# Patient Record
Sex: Female | Born: 1945
Health system: Southern US, Community
[De-identification: ages and names within clinical notes are randomized; demographics above are authoritative.]

## PROBLEM LIST (undated history)

## (undated) DIAGNOSIS — M8580 Other specified disorders of bone density and structure, unspecified site: Secondary | ICD-10-CM

## (undated) DIAGNOSIS — F329 Major depressive disorder, single episode, unspecified: Secondary | ICD-10-CM

## (undated) DIAGNOSIS — K2210 Ulcer of esophagus without bleeding: Secondary | ICD-10-CM

## (undated) DIAGNOSIS — F419 Anxiety disorder, unspecified: Secondary | ICD-10-CM

## (undated) DIAGNOSIS — H269 Unspecified cataract: Secondary | ICD-10-CM

## (undated) DIAGNOSIS — K219 Gastro-esophageal reflux disease without esophagitis: Secondary | ICD-10-CM

## (undated) DIAGNOSIS — G8929 Other chronic pain: Secondary | ICD-10-CM

## (undated) DIAGNOSIS — R112 Nausea with vomiting, unspecified: Secondary | ICD-10-CM

## (undated) DIAGNOSIS — M549 Dorsalgia, unspecified: Secondary | ICD-10-CM

## (undated) DIAGNOSIS — K635 Polyp of colon: Secondary | ICD-10-CM

## (undated) DIAGNOSIS — Z9889 Other specified postprocedural states: Secondary | ICD-10-CM

## (undated) DIAGNOSIS — K449 Diaphragmatic hernia without obstruction or gangrene: Secondary | ICD-10-CM

## (undated) DIAGNOSIS — G4700 Insomnia, unspecified: Secondary | ICD-10-CM

## (undated) DIAGNOSIS — I509 Heart failure, unspecified: Secondary | ICD-10-CM

## (undated) DIAGNOSIS — E785 Hyperlipidemia, unspecified: Secondary | ICD-10-CM

## (undated) DIAGNOSIS — L719 Rosacea, unspecified: Secondary | ICD-10-CM

## (undated) DIAGNOSIS — I10 Essential (primary) hypertension: Secondary | ICD-10-CM

## (undated) DIAGNOSIS — T8859XA Other complications of anesthesia, initial encounter: Secondary | ICD-10-CM

## (undated) DIAGNOSIS — IMO0002 Reserved for concepts with insufficient information to code with codable children: Secondary | ICD-10-CM

## (undated) DIAGNOSIS — C439 Malignant melanoma of skin, unspecified: Secondary | ICD-10-CM

## (undated) DIAGNOSIS — T4145XA Adverse effect of unspecified anesthetic, initial encounter: Secondary | ICD-10-CM

## (undated) DIAGNOSIS — M1990 Unspecified osteoarthritis, unspecified site: Secondary | ICD-10-CM

## (undated) HISTORY — DX: Essential (primary) hypertension: I10

## (undated) HISTORY — DX: Unspecified osteoarthritis, unspecified site: M19.90

## (undated) HISTORY — PX: VAGINAL HYSTERECTOMY: SUR661

## (undated) HISTORY — PX: WISDOM TOOTH EXTRACTION: SHX21

## (undated) HISTORY — DX: Anxiety disorder, unspecified: F41.9

## (undated) HISTORY — DX: Unspecified cataract: H26.9

## (undated) HISTORY — DX: Gastro-esophageal reflux disease without esophagitis: K21.9

## (undated) HISTORY — DX: Dorsalgia, unspecified: M54.9

## (undated) HISTORY — DX: Insomnia, unspecified: G47.00

## (undated) HISTORY — PX: APPENDECTOMY: SHX54

## (undated) HISTORY — PX: TUBAL LIGATION: SHX77

## (undated) HISTORY — DX: Rosacea, unspecified: L71.9

## (undated) HISTORY — PX: SKIN CANCER EXCISION: SHX779

## (undated) HISTORY — DX: Polyp of colon: K63.5

## (undated) HISTORY — DX: Major depressive disorder, single episode, unspecified: F32.9

## (undated) HISTORY — DX: Other specified disorders of bone density and structure, unspecified site: M85.80

## (undated) HISTORY — DX: Ulcer of esophagus without bleeding: K22.10

## (undated) HISTORY — PX: CARDIAC CATHETERIZATION: SHX172

## (undated) HISTORY — DX: Other chronic pain: G89.29

## (undated) HISTORY — DX: Malignant melanoma of skin, unspecified: C43.9

## (undated) HISTORY — DX: Diaphragmatic hernia without obstruction or gangrene: K44.9

## (undated) HISTORY — DX: Hyperlipidemia, unspecified: E78.5

## (undated) HISTORY — DX: Reserved for concepts with insufficient information to code with codable children: IMO0002

---

## 2000-12-23 ENCOUNTER — Other Ambulatory Visit: Admission: RE | Admit: 2000-12-23 | Discharge: 2000-12-23 | Payer: Self-pay | Admitting: Family Medicine

## 2001-06-15 ENCOUNTER — Encounter: Payer: Self-pay | Admitting: Cardiology

## 2001-06-15 ENCOUNTER — Ambulatory Visit (HOSPITAL_COMMUNITY): Admission: RE | Admit: 2001-06-15 | Discharge: 2001-06-16 | Payer: Self-pay | Admitting: Cardiology

## 2001-07-15 ENCOUNTER — Encounter: Payer: Self-pay | Admitting: Gastroenterology

## 2001-10-04 ENCOUNTER — Encounter: Payer: Self-pay | Admitting: Gastroenterology

## 2001-12-24 ENCOUNTER — Other Ambulatory Visit: Admission: RE | Admit: 2001-12-24 | Discharge: 2001-12-24 | Payer: Self-pay | Admitting: Family Medicine

## 2002-12-26 ENCOUNTER — Other Ambulatory Visit: Admission: RE | Admit: 2002-12-26 | Discharge: 2002-12-26 | Payer: Self-pay | Admitting: Family Medicine

## 2004-01-09 ENCOUNTER — Ambulatory Visit: Payer: Self-pay | Admitting: Family Medicine

## 2004-01-09 ENCOUNTER — Other Ambulatory Visit: Admission: RE | Admit: 2004-01-09 | Discharge: 2004-01-09 | Payer: Self-pay | Admitting: Family Medicine

## 2004-02-21 ENCOUNTER — Ambulatory Visit: Payer: Self-pay | Admitting: Family Medicine

## 2004-04-18 ENCOUNTER — Ambulatory Visit: Payer: Self-pay | Admitting: Gastroenterology

## 2004-07-09 ENCOUNTER — Ambulatory Visit: Payer: Self-pay | Admitting: Family Medicine

## 2004-09-09 ENCOUNTER — Ambulatory Visit: Payer: Self-pay | Admitting: Internal Medicine

## 2004-09-18 ENCOUNTER — Ambulatory Visit: Payer: Self-pay | Admitting: Internal Medicine

## 2004-09-18 ENCOUNTER — Ambulatory Visit: Payer: Self-pay

## 2005-01-22 ENCOUNTER — Ambulatory Visit: Payer: Self-pay | Admitting: Family Medicine

## 2005-01-24 ENCOUNTER — Ambulatory Visit: Payer: Self-pay | Admitting: Family Medicine

## 2005-02-18 ENCOUNTER — Ambulatory Visit: Payer: Self-pay | Admitting: Family Medicine

## 2005-05-26 ENCOUNTER — Ambulatory Visit: Payer: Self-pay | Admitting: Family Medicine

## 2005-06-25 ENCOUNTER — Other Ambulatory Visit: Admission: RE | Admit: 2005-06-25 | Discharge: 2005-06-25 | Payer: Self-pay | Admitting: Obstetrics and Gynecology

## 2005-08-06 ENCOUNTER — Ambulatory Visit: Payer: Self-pay | Admitting: Internal Medicine

## 2005-08-20 ENCOUNTER — Encounter (INDEPENDENT_AMBULATORY_CARE_PROVIDER_SITE_OTHER): Payer: Self-pay | Admitting: Specialist

## 2005-08-20 ENCOUNTER — Ambulatory Visit (HOSPITAL_COMMUNITY): Admission: RE | Admit: 2005-08-20 | Discharge: 2005-08-21 | Payer: Self-pay | Admitting: Obstetrics and Gynecology

## 2005-09-30 ENCOUNTER — Ambulatory Visit: Payer: Self-pay | Admitting: Gastroenterology

## 2006-07-08 ENCOUNTER — Ambulatory Visit: Payer: Self-pay | Admitting: Internal Medicine

## 2006-07-31 ENCOUNTER — Encounter: Payer: Self-pay | Admitting: Internal Medicine

## 2006-07-31 ENCOUNTER — Ambulatory Visit: Payer: Self-pay

## 2006-08-06 ENCOUNTER — Ambulatory Visit: Payer: Self-pay | Admitting: Internal Medicine

## 2006-08-11 ENCOUNTER — Ambulatory Visit: Payer: Self-pay | Admitting: Family Medicine

## 2006-08-11 DIAGNOSIS — E7800 Pure hypercholesterolemia, unspecified: Secondary | ICD-10-CM | POA: Insufficient documentation

## 2006-08-11 DIAGNOSIS — E119 Type 2 diabetes mellitus without complications: Secondary | ICD-10-CM | POA: Insufficient documentation

## 2006-08-13 LAB — CONVERTED CEMR LAB
ALT: 20 units/L (ref 0–40)
AST: 23 units/L (ref 0–37)
Cholesterol: 185 mg/dL (ref 0–200)
Direct LDL: 79.5 mg/dL
HDL: 48.2 mg/dL (ref >39.0)
Hgb A1c MFr Bld: 5.5 % (ref 4.6–6.0)
Total CHOL/HDL Ratio: 3.8
Triglycerides: 266 mg/dL (ref 0–149)
VLDL: 53 mg/dL — ABNORMAL HIGH (ref 0–40)

## 2006-10-05 ENCOUNTER — Ambulatory Visit: Payer: Self-pay | Admitting: Gastroenterology

## 2007-04-28 ENCOUNTER — Ambulatory Visit: Payer: Self-pay | Admitting: Family Medicine

## 2007-05-04 LAB — CONVERTED CEMR LAB
CRP, High Sensitivity: <1 — ABNORMAL LOW (ref 0.00–5.00)
Cholesterol: 143 mg/dL (ref 0–200)
Direct LDL: 57.5 mg/dL
HDL: 44.1 mg/dL (ref >39.0)
LDL Cholesterol: 55 mg/dL (ref 0–99)
Total CHOL/HDL Ratio: 3.2
Triglycerides: 221 mg/dL (ref 0–149)
VLDL: 44 mg/dL — ABNORMAL HIGH (ref 0–40)

## 2007-06-03 ENCOUNTER — Ambulatory Visit: Payer: Self-pay | Admitting: Internal Medicine

## 2007-06-22 DIAGNOSIS — K208 Other esophagitis without bleeding: Secondary | ICD-10-CM | POA: Insufficient documentation

## 2007-06-22 DIAGNOSIS — I429 Cardiomyopathy, unspecified: Secondary | ICD-10-CM | POA: Insufficient documentation

## 2007-06-22 DIAGNOSIS — K219 Gastro-esophageal reflux disease without esophagitis: Secondary | ICD-10-CM | POA: Insufficient documentation

## 2007-06-22 DIAGNOSIS — G43909 Migraine, unspecified, not intractable, without status migrainosus: Secondary | ICD-10-CM | POA: Insufficient documentation

## 2007-06-24 ENCOUNTER — Encounter: Payer: Self-pay | Admitting: Family Medicine

## 2007-06-29 ENCOUNTER — Encounter (INDEPENDENT_AMBULATORY_CARE_PROVIDER_SITE_OTHER): Payer: Self-pay | Admitting: *Deleted

## 2007-10-08 DIAGNOSIS — Z860100 Personal history of colon polyps, unspecified: Secondary | ICD-10-CM | POA: Insufficient documentation

## 2007-10-08 DIAGNOSIS — K449 Diaphragmatic hernia without obstruction or gangrene: Secondary | ICD-10-CM | POA: Insufficient documentation

## 2007-10-11 ENCOUNTER — Ambulatory Visit: Payer: Self-pay | Admitting: Gastroenterology

## 2007-11-22 ENCOUNTER — Ambulatory Visit: Payer: Self-pay | Admitting: Internal Medicine

## 2007-11-22 LAB — CONVERTED CEMR LAB
BUN: 15 mg/dL (ref 6–23)
CO2: 31 meq/L (ref 19–32)
Calcium: 9.7 mg/dL (ref 8.4–10.5)
Chloride: 107 meq/L (ref 96–112)
Creatinine, Ser: 0.6 mg/dL (ref 0.4–1.2)
GFR calc Af Amer: 130 mL/min
GFR calc non Af Amer: 107 mL/min
Glucose, Bld: 112 mg/dL — ABNORMAL HIGH (ref 70–99)
Potassium: 3.8 meq/L (ref 3.5–5.1)
Sodium: 142 meq/L (ref 135–145)
TSH: 1.78 microintl units/mL (ref 0.35–5.50)

## 2007-12-09 ENCOUNTER — Ambulatory Visit: Payer: Self-pay | Admitting: Internal Medicine

## 2008-01-20 ENCOUNTER — Ambulatory Visit: Payer: Self-pay | Admitting: Internal Medicine

## 2008-01-20 LAB — CONVERTED CEMR LAB
ALT: 26 units/L (ref 0–35)
AST: 28 units/L (ref 0–37)
Cholesterol: 192 mg/dL (ref 0–200)
Direct LDL: 87 mg/dL
HDL: 47 mg/dL (ref >39.0)
Total CHOL/HDL Ratio: 4.1
Triglycerides: 258 mg/dL (ref 0–149)
VLDL: 52 mg/dL — ABNORMAL HIGH (ref 0–40)

## 2008-06-28 ENCOUNTER — Encounter: Payer: Self-pay | Admitting: Family Medicine

## 2008-06-29 ENCOUNTER — Encounter: Payer: Self-pay | Admitting: Internal Medicine

## 2008-06-29 ENCOUNTER — Ambulatory Visit: Payer: Self-pay | Admitting: Internal Medicine

## 2008-07-05 ENCOUNTER — Encounter (INDEPENDENT_AMBULATORY_CARE_PROVIDER_SITE_OTHER): Payer: Self-pay | Admitting: *Deleted

## 2008-07-24 ENCOUNTER — Ambulatory Visit: Payer: Self-pay | Admitting: Internal Medicine

## 2008-07-24 ENCOUNTER — Encounter: Payer: Self-pay | Admitting: Family Medicine

## 2008-08-01 ENCOUNTER — Encounter (INDEPENDENT_AMBULATORY_CARE_PROVIDER_SITE_OTHER): Payer: Self-pay | Admitting: *Deleted

## 2008-08-08 ENCOUNTER — Encounter (INDEPENDENT_AMBULATORY_CARE_PROVIDER_SITE_OTHER): Payer: Self-pay | Admitting: *Deleted

## 2008-09-08 ENCOUNTER — Telehealth: Payer: Self-pay | Admitting: Internal Medicine

## 2008-09-26 ENCOUNTER — Telehealth: Payer: Self-pay | Admitting: Gastroenterology

## 2008-11-22 ENCOUNTER — Encounter (INDEPENDENT_AMBULATORY_CARE_PROVIDER_SITE_OTHER): Payer: Self-pay | Admitting: *Deleted

## 2009-03-23 ENCOUNTER — Telehealth: Payer: Self-pay | Admitting: Gastroenterology

## 2009-06-27 ENCOUNTER — Telehealth: Payer: Self-pay | Admitting: Internal Medicine

## 2009-06-28 ENCOUNTER — Ambulatory Visit: Payer: Self-pay | Admitting: Cardiology

## 2009-06-28 DIAGNOSIS — R079 Chest pain, unspecified: Secondary | ICD-10-CM | POA: Insufficient documentation

## 2009-07-02 ENCOUNTER — Encounter: Payer: Self-pay | Admitting: Cardiology

## 2009-07-09 ENCOUNTER — Ambulatory Visit: Payer: Self-pay | Admitting: Internal Medicine

## 2009-07-09 ENCOUNTER — Telehealth: Payer: Self-pay | Admitting: Gastroenterology

## 2009-07-12 ENCOUNTER — Encounter: Payer: Self-pay | Admitting: Family Medicine

## 2009-07-16 ENCOUNTER — Telehealth: Payer: Self-pay | Admitting: Internal Medicine

## 2009-07-16 ENCOUNTER — Encounter (INDEPENDENT_AMBULATORY_CARE_PROVIDER_SITE_OTHER): Payer: Self-pay | Admitting: *Deleted

## 2009-07-16 LAB — CONVERTED CEMR LAB
Cholesterol: 175 mg/dL (ref 0–200)
HDL: 54 mg/dL (ref >39.00)
Total CHOL/HDL Ratio: 3
VLDL: 53 mg/dL — ABNORMAL HIGH (ref 0.0–40.0)

## 2009-07-18 ENCOUNTER — Ambulatory Visit: Payer: Self-pay | Admitting: Gastroenterology

## 2009-07-20 ENCOUNTER — Ambulatory Visit: Payer: Self-pay | Admitting: Gastroenterology

## 2009-07-23 ENCOUNTER — Encounter: Payer: Self-pay | Admitting: Gastroenterology

## 2009-07-23 ENCOUNTER — Telehealth: Payer: Self-pay | Admitting: Gastroenterology

## 2009-08-08 ENCOUNTER — Telehealth: Payer: Self-pay | Admitting: Gastroenterology

## 2009-08-14 ENCOUNTER — Telehealth: Payer: Self-pay | Admitting: Gastroenterology

## 2009-08-14 ENCOUNTER — Telehealth: Payer: Self-pay | Admitting: Family Medicine

## 2009-08-29 ENCOUNTER — Ambulatory Visit: Payer: Self-pay | Admitting: Gastroenterology

## 2009-08-29 DIAGNOSIS — R197 Diarrhea, unspecified: Secondary | ICD-10-CM | POA: Insufficient documentation

## 2009-10-02 ENCOUNTER — Encounter: Payer: Self-pay | Admitting: Gastroenterology

## 2009-10-02 ENCOUNTER — Telehealth: Payer: Self-pay | Admitting: Gastroenterology

## 2009-10-06 ENCOUNTER — Encounter: Payer: Self-pay | Admitting: Gastroenterology

## 2009-10-08 ENCOUNTER — Encounter: Payer: Self-pay | Admitting: Gastroenterology

## 2009-10-09 ENCOUNTER — Telehealth: Payer: Self-pay | Admitting: Gastroenterology

## 2009-10-09 DIAGNOSIS — K659 Peritonitis, unspecified: Secondary | ICD-10-CM | POA: Insufficient documentation

## 2009-10-10 ENCOUNTER — Ambulatory Visit: Payer: Self-pay | Admitting: Cardiology

## 2009-10-10 ENCOUNTER — Ambulatory Visit: Payer: Self-pay | Admitting: Cardiovascular Disease

## 2009-10-10 ENCOUNTER — Ambulatory Visit: Payer: Self-pay

## 2009-10-10 ENCOUNTER — Encounter: Payer: Self-pay | Admitting: Cardiology

## 2009-10-10 ENCOUNTER — Ambulatory Visit (HOSPITAL_COMMUNITY): Admission: RE | Admit: 2009-10-10 | Discharge: 2009-10-10 | Payer: Self-pay | Admitting: Cardiology

## 2009-10-10 ENCOUNTER — Encounter: Payer: Self-pay | Admitting: Gastroenterology

## 2009-10-10 DIAGNOSIS — R609 Edema, unspecified: Secondary | ICD-10-CM | POA: Insufficient documentation

## 2009-10-10 DIAGNOSIS — E876 Hypokalemia: Secondary | ICD-10-CM | POA: Insufficient documentation

## 2009-10-10 LAB — CONVERTED CEMR LAB
ALT: 58 units/L — ABNORMAL HIGH (ref 0–35)
AST: 96 units/L — ABNORMAL HIGH (ref 0–37)
Albumin: 2.7 g/dL — ABNORMAL LOW (ref 3.5–5.2)
Alkaline Phosphatase: 46 units/L (ref 39–117)
BUN: 5 mg/dL — ABNORMAL LOW (ref 6–23)
Basophils Absolute: 0 10*3/uL (ref 0.0–0.1)
Basophils Relative: 0.2 % (ref 0.0–3.0)
Bilirubin, Direct: 0.1 mg/dL (ref 0.0–0.3)
CO2: 31 meq/L (ref 19–32)
Calcium: 8.3 mg/dL — ABNORMAL LOW (ref 8.4–10.5)
Chloride: 100 meq/L (ref 96–112)
Creatinine, Ser: 0.5 mg/dL (ref 0.4–1.2)
Eosinophils Absolute: 0.2 10*3/uL (ref 0.0–0.7)
Eosinophils Relative: 2.5 % (ref 0.0–5.0)
GFR calc non Af Amer: 122.98 mL/min (ref >60)
Glucose, Bld: 106 mg/dL — ABNORMAL HIGH (ref 70–99)
HCT: 33 % — ABNORMAL LOW (ref 36.0–46.0)
Hemoglobin: 11.3 g/dL — ABNORMAL LOW (ref 12.0–15.0)
Lymphocytes Relative: 8.3 % — ABNORMAL LOW (ref 12.0–46.0)
Lymphs Abs: 0.8 10*3/uL (ref 0.7–4.0)
MCHC: 34.2 g/dL (ref 30.0–36.0)
MCV: 88.8 fL (ref 78.0–100.0)
Monocytes Absolute: 0.8 10*3/uL (ref 0.1–1.0)
Monocytes Relative: 7.7 % (ref 3.0–12.0)
Neutro Abs: 8.1 10*3/uL — ABNORMAL HIGH (ref 1.4–7.7)
Neutrophils Relative %: 81.3 % — ABNORMAL HIGH (ref 43.0–77.0)
Platelets: 415 10*3/uL — ABNORMAL HIGH (ref 150.0–400.0)
Potassium: 2.8 meq/L — CL (ref 3.5–5.1)
Pro B Natriuretic peptide (BNP): 150.1 pg/mL — ABNORMAL HIGH (ref 0.0–100.0)
RBC: 3.71 M/uL — ABNORMAL LOW (ref 3.87–5.11)
RDW: 12.8 % (ref 11.5–14.6)
Sodium: 143 meq/L (ref 135–145)
Total Bilirubin: 0.2 mg/dL — ABNORMAL LOW (ref 0.3–1.2)
Total Protein: 5.8 g/dL — ABNORMAL LOW (ref 6.0–8.3)
WBC: 9.9 10*3/uL (ref 4.5–10.5)

## 2009-10-11 ENCOUNTER — Ambulatory Visit: Payer: Self-pay | Admitting: Family Medicine

## 2009-10-12 LAB — CONVERTED CEMR LAB
ALT: 63 units/L — ABNORMAL HIGH (ref 0–35)
AST: 84 units/L — ABNORMAL HIGH (ref 0–37)
BUN: 5 mg/dL — ABNORMAL LOW (ref 6–23)
CO2: 29 meq/L (ref 19–32)
Chloride: 105 meq/L (ref 96–112)
Eosinophils Absolute: 0.4 10*3/uL (ref 0.0–0.7)
Eosinophils Relative: 4.3 % (ref 0.0–5.0)
MCHC: 33.8 g/dL (ref 30.0–36.0)
MCV: 88.5 fL (ref 78.0–100.0)
Monocytes Absolute: 0.6 10*3/uL (ref 0.1–1.0)
Neutrophils Relative %: 76.8 % (ref 43.0–77.0)
Platelets: 412 10*3/uL — ABNORMAL HIGH (ref 150.0–400.0)
Potassium: 3.7 meq/L (ref 3.5–5.1)
Total Bilirubin: 0.2 mg/dL — ABNORMAL LOW (ref 0.3–1.2)
Total Protein: 5.4 g/dL — ABNORMAL LOW (ref 6.0–8.3)
WBC: 8.4 10*3/uL (ref 4.5–10.5)

## 2009-10-15 ENCOUNTER — Encounter: Payer: Self-pay | Admitting: Family Medicine

## 2009-10-19 ENCOUNTER — Telehealth: Payer: Self-pay | Admitting: Gastroenterology

## 2009-10-23 ENCOUNTER — Encounter (INDEPENDENT_AMBULATORY_CARE_PROVIDER_SITE_OTHER): Payer: Self-pay | Admitting: *Deleted

## 2009-10-29 ENCOUNTER — Encounter: Payer: Self-pay | Admitting: Family Medicine

## 2009-10-30 ENCOUNTER — Encounter: Admission: RE | Admit: 2009-10-30 | Discharge: 2009-10-30 | Payer: Self-pay | Admitting: General Surgery

## 2010-01-29 ENCOUNTER — Encounter: Payer: Self-pay | Admitting: Family Medicine

## 2010-02-19 ENCOUNTER — Encounter: Payer: Self-pay | Admitting: Gastroenterology

## 2010-04-07 LAB — CONVERTED CEMR LAB
BUN: 13 mg/dL (ref 6–23)
CO2: 24 meq/L (ref 19–32)
Calcium: 10.1 mg/dL (ref 8.4–10.5)
Chloride: 102 meq/L (ref 96–112)
Cholesterol: 195 mg/dL (ref 0–200)
Creatinine, Ser: 0.77 mg/dL (ref 0.40–1.20)
Glucose, Bld: 106 mg/dL — ABNORMAL HIGH (ref 70–99)
HCT: 40.8 % (ref 36.0–46.0)
HDL: 50 mg/dL (ref >39)
Hemoglobin: 13.2 g/dL (ref 12.0–15.0)
MCHC: 32.4 g/dL (ref 30.0–36.0)
MCV: 91.3 fL (ref 78.0–100.0)
Platelets: 274 10*3/uL (ref 150–400)
Potassium: 4.2 meq/L (ref 3.5–5.3)
Pro B Natriuretic peptide (BNP): 10.6 pg/mL (ref 0.0–100.0)
RBC: 4.47 M/uL (ref 3.87–5.11)
RDW: 13.1 % (ref 11.5–15.5)
Sodium: 140 meq/L (ref 135–145)
Total CHOL/HDL Ratio: 3.9
Triglycerides: 524 mg/dL — ABNORMAL HIGH (ref <150)
WBC: 6.3 10*3/uL (ref 4.0–10.5)

## 2010-04-09 NOTE — Procedures (Signed)
Summary: Upper Endoscopy  Patient: [name] Note: All result statuses are Final unless otherwise noted.  Tests: (1) Upper Endoscopy (EGD)   EGD Upper Endoscopy       DONE     [facility]     [address]     [place], [place]  [address]           ENDOSCOPY PROCEDURE REPORT           PATIENT:  [name], Sarah  MR#:  [id number]     BIRTHDATE:  December 22, 1945, 63 yrs. old  GENDER:  female     ENDOSCOPIST:  Judie Petit T. Russella Dar, MD, [facility]           PROCEDURE DATE:  07/20/2009     PROCEDURE:  EGD with biopsy     ASA CLASS:  Class II     INDICATIONS:  GERD, chest pain     MEDICATIONS:  Fentanyl 50 mcg IV, Versed 5 mg IV     TOPICAL ANESTHETIC:  Exactacain Spray     DESCRIPTION OF PROCEDURE:   After the risks benefits and     alternatives of the procedure were thoroughly explained, informed     consent was obtained.  The LB GIF-H180 [id number] endoscope was     introduced through the mouth and advanced to the second portion of     the duodenum, without limitations.  The instrument was slowly     withdrawn as the mucosa was fully examined.     <<PROCEDUREIMAGES>>           Esophagitis was found in the distal esophagus. It was erosive. LA     Classification Grade B. Mild gastritis was found in the body and     the antrum of the stomach. It was erythematous and patchy.     Biopsies of the antrum and body of the stomach were obtained and     sent to pathology.  The duodenal bulb was normal in appearance, as     was the postbulbar duodenum.  Otherwise the examination was     normal.   Retroflexed views revealed a hiatal hernia, 4 cm.  The     scope was then withdrawn from the patient and the procedure     completed.           COMPLICATIONS:  None           ENDOSCOPIC IMPRESSION:     1) Erosive esophagitis     2) Mild gastritis     3) Hiatal hernia           RECOMMENDATIONS:     1) Anti-reflux regimen and 4" bedblocks long term     2) PPI qam: Dexilant 60 mg po qam #30, 11  refills     3) Follow-up: office visit 1 month     4) Await pathology report           Malcolm T. Russella Dar, MD, Clementeen Graham           CC:  Judy Pimple, MD, Pricilla Riffle, MD           n.     Sarah DoctorVenita [name]. Stark at 07/20/2009 08:52 AM           [name], [name], [id number]  Note: An exclamation mark (!) indicates a result that was not dispersed into the flowsheet. Document Creation Date: 07/20/2009 8:53 AM _______________________________________________________________________  (1) Order result status: Final Collection or observation  date-time: 07/20/2009 08:43 Requested date-time:  Receipt date-time:  Reported date-time:  Referring Physician:   Ordering Physician: Claudette Head [phone]) Specimen Source:  Source: [name] Order Number: [id number] Lab site:

## 2010-04-09 NOTE — Progress Notes (Signed)
Summary: Schedule Colonoscopy   Phone Note Outgoing Call Call back at [facility] Phone [phone]   Call placed by: Harlow Mares CMA Duncan Dull),  March 23, 2009 4:59 PM Call placed to: Patient Summary of Call: Left message on patients machine to call back. she needs a direct colonoscopy....this is part of the recall project. Initial call taken by: Harlow Mares CMA Duncan Dull),  March 23, 2009 5:00 PM  Follow-up for Phone Call        Patient is calling back asking why she needs her colonoscopy sooner than 10 years. Her polyps are hyperplastic. She would like Dr.Dane Bloch to to review once more before she schedules her colonoscopy. I advised her I would send this note to Dr. Russella Dar and call her back and call her back. Follow-up by: Harlow Mares CMA Duncan Dull),  March 26, 2009 10:03 AM  Additional Follow-up for Phone Call Additional follow up Details #1::        As she notes a 10 year colonoscopy is correct given the hyperplastic polyp. Please change the scheduled colonoscopy recall to 09/2011. Additional Follow-up by: Meryl Dare MD Clementeen Graham,  March 26, 2009 10:08 AM    Additional Follow-up for Phone Call Additional follow up Details #2::    noted and changed in IDX and EMR flowsheet. Follow-up by: Harlow Mares CMA Duncan Dull),  March 30, 2009 10:37 AM

## 2010-04-09 NOTE — Progress Notes (Signed)
Summary: update on condt.i   Phone Note Call from Patient Call back at [facility] Phone [phone]   Caller: Patient Call For: Dr. Russella Dar Reason for Call: Talk to Nurse Summary of Call: Patient wants to let Dr Russella Dar know that she's doing much better and that she appreciates you calling. Initial call taken by: Tawni Levy,  October 19, 2009 4:43 PM

## 2010-04-09 NOTE — Letter (Signed)
Summary: Appointment - Missed  [facility], Main Office  [address]   [place], [place] [address]   Phone: [phone]  Fax: [phone]     October 23, 2009 MRN: [id number]   [name] [address] [place], [place]  [address]   Dear Ms. Passmore,  Our records indicate you missed your appointment on  August 8,2011 with Dr.Ross It is very important that we reach you to reschedule this appointment. We look forward to participating in your health care needs. Please contact us at the number listed above at your earliest convenience to reschedule this appointment.     Sincerely, Artist

## 2010-04-09 NOTE — Progress Notes (Signed)
Summary: appt   Phone Note Call from Patient Call back at [phone]   Caller: Patient Reason for Call: Talk to Nurse Summary of Call: pt has appt 5/2, does she need to come in before then...Marland KitchenMarland Kitchen pt very tired and wants to make sure everything is ok, some chest discomfort Initial call taken by: Migdalia Dk,  June 27, 2009 3:01 PM  Follow-up for Phone Call        Called patient ...she reports symptoms of chest pain with some jaw pain on and off times one and one-half weeks accompanied with extreme fatique. I offered her an appointment with Dr.Katz in this office tomorrow(DOD) but she wanted to wait until next week to see Dr.Hiba Garry...  I advised her that I was not comfortable with her waiting until next week to be seen. She asked about being seen in the [place] office and  agreed to be seen in the [place] office (tomorrow at 1015am with Dr.McLean) because it is near her home. I provided her with the phone # so she can call for directions. Follow-up by: Suzan Garibaldi RN

## 2010-04-09 NOTE — Letter (Signed)
Summary: [facility]   Imported By: Sherian Rein 10/23/2009 15:00:21  _____________________________________________________________________  External Attachment:    Type:   Image     Comment:   External Document

## 2010-04-09 NOTE — Progress Notes (Signed)
   Phone Note Outgoing Call   Summary of Call: Call back to pt this am.  Pt. c/o "tenderness bottom of my breast bone in the center of my chest."  Pt. rated the pain 1/10.  The pain is this present today.  Also pt reported she did not sleep last pm.  Pt. also questioned to find out if she should continue taking ASA 81mg  daily?  Pt. asked we speak with you about these concerns. Initial call taken by: Eual Fines RN,  Jul 23, 2009 8:43 AM  Follow-up for Phone Call        OK to stay on ASA 81 mg once daily. The pain could be from her esophagitis and she should remain on Dexilant 60mg  po qam and strict antireflux measures. It could take 6-8 weeks for her esophagits to heal.  Follow-up by: Meryl Dare MD FACG,  Jul 23, 2009 10:13 AM  Additional Follow-up for Phone Call Additional follow up Details #1::        Dr. Russella Dar answered questions about [name].  Phone call returned to pt.  Pt adivsed to continue ASA 81 mg daily.  Continue Dexilant 60 mg qam and strict antireflux measures.  The pain could be cased by the esoghagitis and it could take 6-8 weeks to heal.  Pt understands and no further questions at this time. Additional Follow-up by: Eual Fines RN,  Jul 23, 2009 1:02 PM

## 2010-04-09 NOTE — Assessment & Plan Note (Signed)
Summary: GERD, chest pain/all   History of Present Illness Visit Type: Follow-up Visit Primary GI MD: Elie Goody MD [facility] Primary Provider: Roxy Manns, MD Requesting Provider: n/a Chief Complaint: GERD, and chest pain  History of Present Illness:   [name], returns today on referral from Dr. Dietrich Pates. She relates several episodes of grabbing chest tightness that lasts 20-40 minutes at a time. Her symptoms have occurred multiple times over the past several weeks. She recently increased pantoprazole to twice a day from once daily, but continues to have symptoms. She has not noted any significant response to the use of TUMS during episodes of pain. She has a history of erosive esophagitis, associated with chest pain, initially diagnosed in 2003.   GI Review of Systems    Reports acid reflux, chest pain, and  heartburn.      Denies abdominal pain, belching, bloating, dysphagia with liquids, dysphagia with solids, loss of appetite, nausea, vomiting, vomiting blood, weight loss, and  weight gain.        Denies anal fissure, black tarry stools, change in bowel habit, constipation, diarrhea, diverticulosis, fecal incontinence, heme positive stool, hemorrhoids, irritable bowel syndrome, jaundice, light color stool, liver problems, rectal bleeding, and  rectal pain.   Current Medications (verified): 1)  Protonix 40 Mg  Tbec (Pantoprazole Sodium) .... 2 Tablets By Mouth Two Times A Day 2)  Multivitamins   Tabs (Multiple Vitamin) .... One Tablet By Mouth Once Daily 3)  Coreg 12.5 Mg  Tabs (Carvedilol) .... One Tablet By Mouth Two Times A Day 4)  Os-Cal 500 + D 500-200 Mg-Unit  Tabs (Calcium Carbonate-Vitamin D) .... One Tablet By Mouth Two Times A Day 5)  Enalapril Maleate 10 Mg  Tabs (Enalapril Maleate) .... One Tablet By Mouth Once Daily 6)  Simvastatin 20 Mg Tabs (Simvastatin) .... Take One Tablet By Mouth Daily At Bedtime 7)  Aspirin 81 Mg Tbec (Aspirin) .... Take One Tablet By Mouth  Daily  Allergies (verified): 1)  ! Penicillin 2)  ! Ceftin 3)  ! Prevacid (Lansoprazole) 4)  ! * Latex  Past History:  Past Medical History: 1. COLONIC POLYPS, HYPERPLASTIC, HX OF (ICD-V12.72) 2. MIGRAINE HEADACHE (ICD-346.90) 3. CARDIOMYOPATHY (ICD-425.4): Nonischemic.  Left heart cath 8/03 with no significant CAD.  EF moderate to severely depressed at one point but last echo in 5/08 showed EF 45-50% with peri-apical hypokinesis.  4. GERD (ICD-530.81) with EROSIVE ESOPHAGITIS LA Classification Grade B this(ICD-530.19) 5.  Dyslipidemia 6. Osteopenia     Past Surgical History: Reviewed history from 08/08/2008 and no changes required. BTL Hysterectomy osteopenia - dexa 5/10  Family History: Family History of Heart Disease: Father Alzheimer's disease: Mother No FH of Colon Cancer:  Social History: Reviewed history from 10/11/2007 and no changes required. 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  Alcohol Use - yes-wine Illicit Drug Use - no Patient gets regular exercise.  Review of Systems       The pertinent positives and negatives are noted as above and in the HPI. All other ROS were reviewed and were negative.   Vital Signs:  Patient profile:   65 year old female Height:      62 inches Weight:      135 pounds BSA:     1.62 Pulse rate:   62 / minute Pulse rhythm:   regular BP sitting:   126 / 64  (left arm) Cuff size:   regular  Vitals Entered By: Ok Anis CMA (Jul 18, 2009  10:23 AM)  Physical Exam  General:  Well developed, well nourished, no acute distress. Head:  Normocephalic and atraumatic. Eyes:  PERRLA, no icterus. Ears:  Normal auditory acuity. Mouth:  No deformity or lesions, dentition normal. Chest Wall:  Symmetrical;  no deformities or tenderness. Lungs:  Clear throughout to auscultation. Heart:  Regular rate and rhythm; no murmurs, rubs,  or bruits. Abdomen:  Soft, nontender and nondistended. No masses, hepatosplenomegaly or  hernias noted. Normal bowel sounds. Neurologic:  Alert and  oriented x4;  grossly normal neurologically. Psych:  Alert and cooperative. Normal mood and affect. anxious.  anxious.    Impression & Recommendations:  Problem # 1:  CHEST PAIN (ICD-786.50) Atypical chest pain. Symptoms are somewhat similar to her prior reflux symptoms. If EGD does not reveal significant problems. She will need to return to Dr. Tenny Craw for further evaluation. See problem #2. Orders: EGD (EGD)  Problem # 2:  GERD (ICD-530.81) Rule out recurrent esophagitis. Discontinue pantoprazole and begin Dexilant 60 mg q.a.m. May use TUMS as needed. The risks, benefits and alternatives to endoscopy with possible biopsy and possible dilation were discussed with the patient and they consent to proceed. The procedure will be scheduled electively. Orders: EGD (EGD)  Problem # 3:  SCREENING COLORECTAL-CANCER (ICD-V76.51) Average risk for colorectal cancer. Prior history of hyperplastic colon polyps. Screening colonoscopy recommended July 2013.  Patient Instructions: 1)  Hold Protonix and start Dexilant samples one tablet by mouth once daily. 2)  Upper Endoscopy brochure given.  3)  Copy sent to : Judith Part, MD 4)                         Dietrich Pates, MD 5)  The medication list was reviewed and reconciled.  All changed / newly prescribed medications were explained.  A complete medication list was provided to the patient / caregiver.  Appended Document: GERD, chest pain/all    Clinical Lists Changes  Observations: Added new observation of COLONNXTDUE: 09/2011 (07/18/2009 11:12)

## 2010-04-09 NOTE — Letter (Signed)
Summary: EGD Instructions  [facility]  [address] [place], [place] [address]   Phone: [phone]  Fax: [phone]       [name]    [birth year]    MRN: [id number]       Procedure Day /Date: Friday May 13th, 2011     Arrival Time: 7:30am     Procedure Time: 8:30am     Location of Procedure:                    _ x _ [facility] (4th Floor)    PREPARATION FOR ENDOSCOPY   On 07/20/09 THE DAY OF THE PROCEDURE:  1.   No solid foods, milk or milk products are allowed after midnight the night before your procedure.  2.   Do not drink anything colored red or purple.  Avoid juices with pulp.  No orange juice.  3.  You may drink clear liquids until 6:30am, which is 2 hours before your procedure.                                                                                                CLEAR LIQUIDS INCLUDE: Water Jello Ice Popsicles Tea (sugar ok, no milk/cream) Powdered fruit flavored drinks Coffee (sugar ok, no milk/cream) Gatorade Juice: apple, white grape, white cranberry  Lemonade Clear bullion, consomm, broth Carbonated beverages (any kind) Strained chicken noodle soup Hard Candy   MEDICATION INSTRUCTIONS  Unless otherwise instructed, you should take regular prescription medications with a small sip of water as early as possible the morning of your procedure.         OTHER INSTRUCTIONS  You will need a responsible adult at least 66 years of age to accompany you and drive you home.   This person must remain in the waiting room during your procedure.  Wear loose fitting clothing that is easily removed.  Leave jewelry and other valuables at home.  However, you may wish to bring a book to read or an iPod/MP3 player to listen to music as you wait for your procedure to start.  Remove all body piercing jewelry and leave at home.  Total time from sign-in until discharge is approximately 2-3 hours.  You should go home  directly after your procedure and rest.  You can resume normal activities the day after your procedure.  The day of your procedure you should not:   Drive   Make legal decisions   Operate machinery   Drink alcohol   Return to work  You will receive specific instructions about eating, activities and medications before you leave.    The above instructions have been reviewed and explained to me by   Marchelle Folks.     I fully understand and can verbalize these instructions _____________________________ Date _________

## 2010-04-09 NOTE — Progress Notes (Signed)
Summary: Authorization   Phone Note From Other Clinic   Caller: [place] @ [facility]  [phone] Call For: Dr. Russella Dar Summary of Call: Kathrynn Humble. was approved on May 23 with no end date. Iit is being rejected because it is too soon.  Initial call taken by: Karna Christmas,  August 08, 2009 3:59 PM  Follow-up for Phone Call        i spoke to [facility] and they have already approved the Dexilant and they have called the Pharm and spoke to Zella Ball the pt is aware her rx is ready and she can pick them up.  Follow-up by: Harlow Mares CMA Duncan Dull),  August 08, 2009 4:27 PM

## 2010-04-09 NOTE — Progress Notes (Signed)
Summary: auth needed   Phone Note From Pharmacy Call back at [phone]   Caller: Zella Ball, pharmacist Call For: Dr. Russella Dar  Summary of Call: Dexilant... prior authorization was not completed the first time and a new auth needs to be initiated Initial call taken by: Vallarie Mare,  August 08, 2009 1:33 PM  Follow-up for Phone Call        spoke to [facility] and they state that the prior auth hass been approved for the patients Dexilant and i called robin at the pharm to advise him he can wait 15 min and rerun the rx. Follow-up by: Harlow Mares CMA Duncan Dull),  August 08, 2009 2:14 PM

## 2010-04-09 NOTE — Assessment & Plan Note (Signed)
Summary: add-on, chest tightness   Medications Added FLAGYL 500 MG TABS (METRONIDAZOLE) three times a day as directed CIPROFLOXACIN HCL 500 MG TABS (CIPROFLOXACIN HCL) two times a day as directed POTASSIUM CHLORIDE CRYS CR 20 MEQ CR-TABS (POTASSIUM CHLORIDE CRYS CR) take 4 tablets today and 2 tablets in the morning, recheck BMP 10/11/09 FUROSEMIDE 20 MG TABS (FUROSEMIDE) Take as directed--do not start until MD makes further recommendations      Allergies Added:   Referring Johanan Skorupski:  n/a Primary Ereka Brau:  Roxy Manns, MD   History of Present Illness: This nice woman has a nonischemic CM, with EF previously of 20% and normal coronaries, and has been followed by Dr. Tenny Craw.  EF improved after initiation of therapy.  Has been stable overall.  Recently had erosive esophagitis.  She then was at [facility], had a ruptured appendix, and underwent emergency surgery.  Has done well, but now developed lower abdominal and extremity edema which has  worried her and her family.  Prior to this time, she was doing well.  She has had some chest discomfort, but much like what she has had in the past.  She saw Dr. Shirlee Latch recently, which led to her GI evaluation.   She has not had exertional discomfort.  Was set up for a CT of the abdomen by Dr. Russella Dar, but our CT team was concerned about  the chest symptoms, and she was added on to the DOD schedule.   Last BMET was stable, but six days earlier, and she has changing volume status and also takes an ACE.  Her daughter thought her by mouth intake has been poor.  Current Medications (verified): 1)  Multivitamins   Tabs (Multiple Vitamin) .... One Tablet By Mouth Once Daily 2)  Coreg 12.5 Mg  Tabs (Carvedilol) .... One Tablet By Mouth Two Times A Day 3)  Os-Cal 500 + D 500-200 Mg-Unit  Tabs (Calcium Carbonate-Vitamin D) .... One Tablet By Mouth Two Times A Day 4)  Enalapril Maleate 10 Mg  Tabs (Enalapril Maleate) .... One Tablet By Mouth Once Daily 5)  Simvastatin  20 Mg Tabs (Simvastatin) .... Take One Tablet By Mouth Daily At Bedtime 6)  Zostavax [address] Unt/0.91ml Solr (Zoster Vaccine Live) .... Inject Times One As Directed 7)  Pepcid Ac 10 Mg Tabs (Famotidine) .... Take One By Mouth As Needed 8)  Flagyl 500 Mg Tabs (Metronidazole) .... Three Times A Day As Directed 9)  Ciprofloxacin Hcl 500 Mg Tabs (Ciprofloxacin Hcl) .... Two Times A Day As Directed  Allergies (verified): 1)  ! Penicillin 2)  ! Ceftin 3)  ! Prevacid (Lansoprazole) 4)  ! * Latex  Past History:  Past Medical History: Last updated: 08/29/2009 1. COLONIC POLYPS, HYPERPLASTIC, HX OF (ICD-V12.72) 2. MIGRAINE HEADACHE (ICD-346.90) 3. CARDIOMYOPATHY (ICD-425.4): Nonischemic.  Left heart cath 8/03 with no significant CAD.  EF moderate to severely depressed at one point but last echo in 5/08 showed EF 45-50% with peri-apical hypokinesis.  4. GERD with EROSIVE ESOPHAGITIS LA Classification Grade B 5.  Dyslipidemia 6. Osteopenia  7. Hiatal hernia  Family History: Last updated: 07/18/2009 Family History of Heart Disease: Father Alzheimer's disease: Mother No FH of Colon Cancer:  Social History: Last updated: 10/11/2007 Occupation:Housewife  Patient is a former smoker.  Alcohol Use - yes-wine Illicit Drug Use - no Patient gets regular exercise.  Risk Factors: Exercise: yes (10/11/2007)  Risk Factors: Smoking Status: quit (10/11/2007)  Past Surgical History: BTL Hysterectomy Osteopenia - dexa 5/10 Repair of  ruptured appendix   09/2009  Vital Signs:  Patient profile:   65 year old female Height:      62 inches Weight:      137.0 pounds BP supine:   140 / 82  (right arm)  Vitals Entered By: Burnett Kanaris, CNA (October 10, 2009 12:22 PM)  Physical Exam  General:  Well developed, well nourished, in no acute distress. Head:  normocephalic and atraumatic Eyes:  PERRLA/EOM intact; conjunctiva and lids normal. Neck:  Not distended Chest Wall:  no deformities or breast  masses noted Lungs:  Lungs clear to auscultation and percussion. Heart:  PMI non displaced.  Normal S1.  Widely split s2.  No definite murmur. Abdomen:  Bowel sounds positive; abdomen soft and non-tender without masses, organomegaly, or hernias noted. No hepatosplenomegaly. Msk:  Back normal, normal gait. Muscle strength and tone normal. Pulses:  pulses normal in all 4 extremities Extremities:  Edema bilaterally lower extremities extending to lower abdomen.   Neurologic:  Alert and oriented x 3.   Cardiac Cath  Procedure date:  06/15/2001  Findings:      CONCLUSIONS: 1. Normal coronary angiography. 2. Severe left ventricular dysfunction, with ejection fraction of 29%.  RECOMMENDATIONS:  The left ventricular function appears to be more global than described on the echocardiogram and Cardiolite scans.  There is no coronary obstructive disease to explain this, and I suspect she has a nonischemic cardiomyopathy.  The etiology of this is not clear.  Will plan to start the patient on an ACE inhibitor and make her an outpatient today.  Arrange for her to see Dr. Dietrich Pates back in follow-up tomorrow. Dictated by:   Everardo Beals Juanda Chance, M.D. [facility] Attending Physician:  Lenoria Farrier DD:  06/15/01  EKG  Procedure date:  10/10/2009  Findings:      NSR.  LBBB  Echocardiogram  Procedure date:  10/10/2009  Findings:      Study Conclusions            - Left ventricle: Abnormal septal motion and hypokinesis The cavity       size was mildly dilated. There was mild focal basal hypertrophy of       the septum. Systolic function was normal. The estimated ejection       fraction was in the range of 50% to 55%. Wall motion was normal;       there were no regional wall motion abnormalities. Doppler       parameters are consistent with abnormal left ventricular       relaxation (grade 1 diastolic dysfunction).     - Ventricular septum: Septal motion showed abnormal function. These        changes are consistent with a left bundle branch block.     - Mitral valve: Mild regurgitation.  CT Scan  Procedure date:  10/10/2009  Findings:      IMPRESSION:   1.  No evidence of pulmonary embolism on today's exam. 2.  Small bilateral pleural effusions with associated dependent atelectasis. 3.  Small hiatal hernia. 4.  Cardiomegaly.   Read By:  Vincenza Hews,  M.D.     Released By:  Vincenza Hews,  M.D.  Impression & Recommendations:  Problem # 1:  LEG EDEMA, BILATERAL (ICD-782.3) May be associated with some element of third spacing.  Albumin is 2.7, she is post op and not ambulatory.  JVP is not elevated, and echo suggests improved function.  Dopplers are negative for DVT.  CT is  negative for PE.  Keep elevated.  Will give some furosemide but hold off on use.   CT Scan  (CT Scan)  Problem # 2:  HYPOKALEMIA (ICD-276.8) K is 2.8  Will replace today with two doses of , and repeat dose in am, then check K tomorrow in the office.    Problem # 3:  CHEST PAIN (ICD-786.50) Baseline ECG shows LBBB, but prior normal coronaries, not a smoker, and CT done which shows no PE.  Will follow. Does have recent erosive gastritis. The following medications were removed from the medication list:    Aspirin 81 Mg Tbec (Aspirin) .Marland Kitchen... Take one tablet by mouth daily Her updated medication list for this problem includes:    Coreg 12.5 Mg Tabs (Carvedilol) ..... One tablet by mouth two times a day    Enalapril Maleate 10 Mg Tabs (Enalapril maleate) ..... One tablet by mouth once daily  Orders: Echocardiogram (Echo) CT Scan  (CT Scan) TLB-BMP (Basic Metabolic Panel-BMET) (80048-METABOL) TLB-BNP (B-Natriuretic Peptide) (83880-BNPR) TLB-CBC Platelet - w/Differential (85025-CBCD)  Problem # 4:  CARDIOMYOPATHY (ICD-425.4) Non ischemic by history, and echo today looks good.  Continue current medication.  Do not take furosemide without instructions to do so.  The following medications  were removed from the medication list:    Aspirin 81 Mg Tbec (Aspirin) .Marland Kitchen... Take one tablet by mouth daily Her updated medication list for this problem includes:    Coreg 12.5 Mg Tabs (Carvedilol) ..... One tablet by mouth two times a day    Enalapril Maleate 10 Mg Tabs (Enalapril maleate) ..... One tablet by mouth once daily    Furosemide 20 Mg Tabs (Furosemide) .Marland Kitchen... Take as directed--do not start until md makes further recommendations  Orders: Echocardiogram (Echo) CT Scan  (CT Scan) TLB-BNP (B-Natriuretic Peptide) (83880-BNPR) TLB-CBC Platelet - w/Differential (85025-CBCD)  Patient Instructions: 1)  Your physician recommends that you have lab work today: BMP, CBC, BNP and LIVER 2)  Your physician has recommended you make the following change in your medication: Start Potassium Chloride 2 tablets now, one tablet in morning and recheck BMP on 10/11/09, Prescription for Furosemide 20mg  daily sent into pharmacy, do not start until Dr Riley Kill makes further recommendations 3)  Your physician has requested that you have a lower extremity venous duplex.  This test is an ultrasound of the veins in the legs.  It looks at venous blood flow that carries blood from the heart to the legs.  Allow one hour for a Lower Venous exam.  There are no restrictions or special instructions. 4)  Your physician has requested that you have an echocardiogram.  Echocardiography is a painless test that uses sound waves to create images of your heart. It provides your doctor with information about the size and shape of your heart and how well your heart's chambers and valves are working.  This procedure takes approximately one hour. There are no restrictions for this procedure. 5)  Your physician recommends that you schedule a follow-up appointment in: 1 WEEK with Dr Tenny Craw

## 2010-04-09 NOTE — Progress Notes (Signed)
Summary: triage   Phone Note Call from Patient Call back at [phone]   Caller: Patient Call For: Dr Russella Dar Reason for Call: Talk to Nurse Summary of Call: Patient wants to speak to nurse regarding emergency appendectomy she had done while she was on vacation. Initial call taken by: Tawni Levy,  October 09, 2009 12:44 PM  Follow-up for Phone Call        Patient had to have an emergency appendectomy and had peritonitis in [place].  She spent 6 nights in the hospital she currently is on  2 antibiotics  Cipro and another antibiotic she is not sure of the name.  Patient  c/o abdominal distention bloating, fluid in legs and lower abdomen.  Denies fever.  Discussed with Dr Russella Dar, patient should be seen by a surgeon and I have arranged for an appointment with [facility] Dr Dwain Sarna 10/10/09 at 3:00 patient aware.  I have requested records from [place] [phone].   Follow-up by: Darcey Nora RN, CGRN,  October 09, 2009 1:42 PM     Appended Document: triage [facility] called back and has requested we order CT scan abdomen and pelvis.  per Dr Russella Dar patient also will need CBC, CMET, amylase, lipase prior to CT tomorrow.  Patient  aware   Clinical Lists Changes  Problems: Added new problem of PERITONITIS (ICD-567.9) Orders: Added new Referral order of CT Abdomen/Pelvis with Contrast (CT Abd/Pelvis w/con) - Signed

## 2010-04-09 NOTE — Progress Notes (Signed)
Summary: f/u appt   Phone Note Call from Patient Call back at Home Phone [phone]   Caller: Patient Call For: Russella Dar Reason for Call: Talk to Nurse Summary of Call: Patietn returning Amanda's call. Initial call taken by: Tawni Levy,  Jul 09, 2009 4:54 PM  Follow-up for Phone Call        Pt needs a follow up visit per Dr. Tenny Craw for her worsening GERD symptoms. Pt has been schedule for a appt on 07/18/09 at 10:30am. Pt notified and verbalized understanding.  Follow-up by: Christie Nottingham CMA Duncan Dull),  Jul 10, 2009 8:47 AM

## 2010-04-09 NOTE — Letter (Signed)
Summary: Patient [facility] Biopsy Results  [facility]  [address] [place], [place] [address]   Phone: [phone]  Fax: [phone]        Jul 23, 2009 MRN: [id number]    [facility] [address] [place], [place]  [address]    Dear Ms. Mousel,  I am pleased to inform you that the biopsies taken during your recent endoscopic examination did not show any evidence of cancer upon pathologic examination. The biopsies showed minimal chronic gastritis.  Continue with the treatment plan as outlined on the day of your      exam.  Please call us if you are having persistent problems or have questions about your condition that have not been fully answered at this time.  Sincerely,  Meryl Dare MD [facility]  This letter has been electronically signed by your physician.  Appended Document: Patient Notice-Endo Biopsy Results letter mailed

## 2010-04-09 NOTE — Assessment & Plan Note (Signed)
Summary: chest pain and extreme fatique JR  Medications Added PROTONIX 40 MG  TBEC (PANTOPRAZOLE SODIUM) 2 tablets by mouth two times a day ASPIRIN 81 MG TBEC (ASPIRIN) Take one tablet by mouth daily      Allergies Added: ! * LATEX  Visit Type:  Follow-up Primary Provider:  Roxy Manns, MD  CC:  Patient is having Chest pain and had tightness in chest last night.  History of Present Illness: 65 yo patient of Dr. Dietrich Pates with history of nonischemic cardiomyopathy and erosive esophagitis presents as add on for evaluation of chest pain and fatigue.  Patient has been having chest tightness for about 1.5 weeks.  It is a grabbing pain.  It will last about 10 minutes at a time.  Not related to exertion or meals.  Tums helps occasionally but not always. No odynophagia.  It radiates to her jaw.  Occurs 1-2 times daily somewhat at random.  She is worried because it does not feel like her usual GERD.  She started taking Protonix two times a day this week but the increased dose does not seem to have helped.  She also has felt tired and exhausted for the last 1.5 weeks.  She is not short of breath per se.  Last echo was in 2008 and showed EF 45-50%.  BNP and CBC today were normal.   ECG: NSR, IVCD  Current Medications (verified): 1)  Protonix 40 Mg  Tbec (Pantoprazole Sodium) .... One Tablet By Mouth Two Times A Day 2)  Multivitamins   Tabs (Multiple Vitamin) .... One Tablet By Mouth Once Daily 3)  Coreg 12.5 Mg  Tabs (Carvedilol) .... One Tablet By Mouth Two Times A Day 4)  Os-Cal 500 + D 500-200 Mg-Unit  Tabs (Calcium Carbonate-Vitamin D) .... One Tablet By Mouth Two Times A Day 5)  Enalapril Maleate 10 Mg  Tabs (Enalapril Maleate) .... One Tablet By Mouth Once Daily 6)  Simvastatin 20 Mg Tabs (Simvastatin) .... Take One Tablet By Mouth Daily At Bedtime  Allergies (verified): 1)  ! Penicillin 2)  ! Ceftin 3)  ! Prevacid (Lansoprazole) 4)  ! * Latex  Past History:  Past Medical History: 1.  COLONIC POLYPS, HYPERPLASTIC, HX OF (ICD-V12.72) 2. MIGRAINE HEADACHE (ICD-346.90) 3. CARDIOMYOPATHY (ICD-425.4): Nonischemic.  Left heart cath 8/03 with no significant CAD.  EF moderate to severely depressed at one point but last echo in 5/08 showed EF 45-50% with peri-apical hypokinesis.  4. GERD (ICD-530.81) with EROSIVE ESOPHAGITIS (ICD-530.19) 5. HYPERCHOLESTEROLEMIA, PURE (ICD-272.0) 6. osteopenia     Family History: Reviewed history from 10/08/2007 and no changes required. Family History of Heart Disease: Father Alzheimer's disease: Mother  Social History: Reviewed history from 10/11/2007 and no changes required. 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  Alcohol Use - yes-wine Illicit Drug Use - no Patient gets regular exercise.  Review of Systems       All systems reviewed and negative except as per HPI.   Vital Signs:  Patient profile:   65 year old female Height:      62 inches Weight:      138 pounds BMI:     25.33 Pulse rate:   75 / minute BP sitting:   116 / 74  (left arm) Cuff size:   large  Physical Exam  General:  Well developed, well nourished, in no acute distress. Neck:  Neck supple, no JVD. No masses, thyromegaly or abnormal cervical nodes. Lungs:  Clear bilaterally to auscultation and percussion.  Heart:  Non-displaced PMI, chest non-tender; regular rate and rhythm, S1, S2 without murmurs, rubs or gallops. Carotid upstroke normal, no bruit.  Pedals normal pulses. No edema, no varicosities. Abdomen:  Bowel sounds positive; abdomen soft and non-tender without masses, organomegaly, or hernias noted. No hepatosplenomegaly. Extremities:  No clubbing or cyanosis. Neurologic:  Alert and oriented x 3. Psych:  Normal affect.   Impression & Recommendations:  Problem # 1:  CHEST PAIN (ICD-786.50) Atypical chest pain.  Does not feel like her GERD.  She does have a history of erosive esophagitis but symptoms are not occurring with meals.  She had no  CAD on cath in 2003 but does have some risk factors (hyperlipidemia).  ECG with IVCD.  Will set her up for ETT-myoview.  She will start ASA 81 mg daily.  If myoview looks ok, suspect sequelae of GERD or erosive esophagitis.  Would continue protonix two times a day for now, followup with GI if myoview ok.   Problem # 2:  CARDIOMYOPATHY (ICD-425.4) Will repeat echo to reassess LV function.  She will continue Coreg and enalapril. She does not appear volume overloaded on exam.  She is fatigued but does not have shortness of breath with exertion.   Problem # 3:  FATIGUE ? Cause.  BNP and CBC normal.  Will get TSH.  Does not appear to have significant CHF on exam. No new meds.   Other Orders: T-Basic Metabolic Panel [phone]) EKG w/ Interpretation (93000) T-CBC No Diff ([id number]) T-TSH [phone]) T-BNP  (B Natriuretic Peptide) (72536-64403) Nuclear Stress Test (Nuc Stress Test) Echocardiogram (Echo)  Patient Instructions: 1)  Your physician recommends that you schedule a follow-up appointment in: 1 week with dr Tenny Craw 2)  Your physician has recommended you make the following change in your medication: start Aspirin 81mg  daily and increase your protonix to 2 tablets bid 3)  Your physician has requested that you have an echocardiogram.  Echocardiography is a painless test that uses sound waves to create images of your heart. It provides your doctor with information about the size and shape of your heart and how well your heart's chambers and valves are working.  This procedure takes approximately one hour. There are no restrictions for this procedure. 4)  Your physician has requested that you have an adenosine myoview.  For further information please visit [email or web address]  Please follow instruction sheet, as given.

## 2010-04-09 NOTE — Letter (Signed)
Summary: [facility]   Imported By: Lanelle Bal 11/14/2009 12:17:01  _____________________________________________________________________  External Attachment:    Type:   Image     Comment:   External Document

## 2010-04-09 NOTE — Progress Notes (Signed)
Summary: Triage   Phone Note Call from Patient Call back at [phone]   Caller: Patient Call For: Dr. Russella Dar Reason for Call: Talk to Nurse Summary of Call: no appetite, severe abd. pain, bloating. [place] [place] and wants to know if she should go to Urgent Care Initial call taken by: Karna Christmas,  October 02, 2009 4:54 PM  Follow-up for Phone Call        [place]. [place] beach [place] noticed abd. pain after got there says it hasn't improved. Pt. advised to go to urgent care or ER. there as today pain is a level 10 in lower quadrants and nothing rellieves it.  Follow-up by: Teryl Lucy RN,  October 02, 2009 5:02 PM  Additional Follow-up for Phone Call Additional follow up Details #1::        Agree. Needs urgent evaluation at nearby ED or Urgent Care center. Additional Follow-up by: Meryl Dare MD Clementeen Graham,  October 02, 2009 5:11 PM

## 2010-04-09 NOTE — Progress Notes (Signed)
Summary: pt needs order for zostavax  Phone Note Call from Patient Call back at [phone]   Caller: Patient Summary of Call: Pt is asking for order for zostavax, we are out of vaccine and dont know when we will get any.  Please call when order is ready and pt will pick up.  Later in the week is fine. Initial call taken by: Lowella Petties CMA,  August 14, 2009 12:04 PM  Follow-up for Phone Call        px written on EMR for call in  Follow-up by: Judith Part MD,  August 14, 2009 6:39 PM  Additional Follow-up for Phone Call Additional follow up Details #1::        Patient notified as instructed by telephone. Pt wants order sent to [facility]. Medication phoned to [facility]. pharmacy as instructed. Lewanda Rife LPN  August 16, 4538 12:53 PM     New/Updated Medications: ZOSTAVAX 98119 UNT/0.65ML SOLR (ZOSTER VACCINE LIVE) inject times one as directed Prescriptions: ZOSTAVAX 14782 UNT/0.65ML SOLR (ZOSTER VACCINE LIVE) inject times one as directed  #1 x 0   Entered and Authorized by:   Judith Part MD   Signed by:   Judith Part MD on 08/14/2009   Method used:   Telephoned to ...       [facility] [phone]* (retail)       [address] [place], [place]  [address]       Ph: [phone] or [phone]       Fax: [phone]   RxID:   [id number]

## 2010-04-09 NOTE — Miscellaneous (Signed)
Summary: RX Dexilant  Clinical Lists Changes  Medications: Added new medication of DEXILANT 60 MG CPDR (DEXLANSOPRAZOLE) 1 tablet by mouth every morning - Signed Rx of DEXILANT 60 MG CPDR (DEXLANSOPRAZOLE) 1 tablet by mouth every morning;  #30 x 11;  Signed;  Entered by: Sherren Kerns RN;  Authorized by: Meryl Dare MD [facility];  Method used: [facility] to [facility]. [phone]*, [address], [place], [place], [place]  [address], Ph: [phone] or [phone], Fax: [phone] Observations: Added new observation of ALLERGY REV: Done (07/20/2009 8:57)    Prescriptions: DEXILANT 60 MG CPDR (DEXLANSOPRAZOLE) 1 tablet by mouth every morning  #30 x 11   Entered by:   Sherren Kerns RN   Authorized by:   Meryl Dare MD [facility]   Signed by:   Sherren Kerns RN on 07/20/2009   Method used:   Electronically to        [facility] [phone]* (retail)       [address] [place], [place]  [address]       Ph: [phone] or [phone]       Fax: [phone]   RxID:   [id number]

## 2010-04-09 NOTE — Miscellaneous (Signed)
Summary: Orders Update  Clinical Lists Changes  Problems: Added new problem of LEG EDEMA, BILATERAL (ICD-782.3) Orders: Added new Test order of Venous Duplex Lower Extremity (Venous Duplex Lower) - Signed

## 2010-04-09 NOTE — Assessment & Plan Note (Signed)
Summary: rov    Visit Type:  rov Primary Provider:  Roxy Manns, MD   History of Present Illness: Patient is a 65 year old with a hsitory of NICM as well as erosive esophagitis.  She was just seen by [facility] in [place] for evaluation of chest pain.  Per the patient she has intermittent episodes of substernal chest pressure.  Episodes occur without activity.  Occasionally go up into her neck.  Not associated with shortness of breath.  Similar in quality to previuos esophagitis. With activity, the patient denies chest pains.  She remains fairly active.  She was started on ASA by [facility].  She stopped this because of her history of esophagitis.  She still is on Protonix.  Problems Prior to Update: 1)  Chest Pain  (ICD-786.50) 2)  Cardiomyopathy  (ICD-425.4) 3)  Hypercholesterolemia, Pure  (ICD-272.0) 4)  Hiatal Hernia  (ICD-553.3) 5)  Colonic Polyps, Hyperplastic, Hx of  (ICD-V12.72) 6)  Migraine Headache  (ICD-346.90) 7)  Erosive Esophagitis  (ICD-530.19) 8)  Gerd  (ICD-530.81) 9)  Aodm  (ICD-250.00)  Current Medications (verified): 1)  Protonix 40 Mg  Tbec (Pantoprazole Sodium) .... 2 Tablets By Mouth Two Times A Day 2)  Multivitamins   Tabs (Multiple Vitamin) .... One Tablet By Mouth Once Daily 3)  Coreg 12.5 Mg  Tabs (Carvedilol) .... One Tablet By Mouth Two Times A Day 4)  Os-Cal 500 + D 500-200 Mg-Unit  Tabs (Calcium Carbonate-Vitamin D) .... One Tablet By Mouth Two Times A Day 5)  Enalapril Maleate 10 Mg  Tabs (Enalapril Maleate) .... One Tablet By Mouth Once Daily 6)  Simvastatin 20 Mg Tabs (Simvastatin) .... Take One Tablet By Mouth Daily At Bedtime 7)  Aspirin 81 Mg Tbec (Aspirin) .... Take One Tablet By Mouth Daily  Allergies: 1)  ! Penicillin 2)  ! Ceftin 3)  ! Prevacid (Lansoprazole) 4)  ! * Latex  Past History:  Past medical, surgical, family and social histories (including risk factors) reviewed, and no changes noted (except as noted below).  Past Medical  History: 1. COLONIC POLYPS, HYPERPLASTIC, HX OF (ICD-V12.72) 2. MIGRAINE HEADACHE (ICD-346.90) 3. CARDIOMYOPATHY (ICD-425.4): Nonischemic.  Left heart cath 8/03 with no significant CAD.  EF moderate to severely depressed at one point but last echo in 5/08 showed EF 45-50% with peri-apical hypokinesis.  4. GERD (ICD-530.81) with EROSIVE ESOPHAGITIS (ICD-530.19) 5.  Dyslipidemia 6. osteopenia     Past Surgical History: Reviewed history from 08/08/2008 and no changes required. BTL Hysterectomy osteopenia - dexa 5/10  Family History: Reviewed history from 10/08/2007 and no changes required. Family History of Heart Disease: Father Alzheimer's disease: Mother  Social History: Reviewed history from 10/11/2007 and no changes required. 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  Alcohol Use - yes-wine Illicit Drug Use - no Patient gets regular exercise.  Review of Systems       All systmes reviewed.  Negative to above problem. Noted patient had lipids checked on last visit.  Were nonfasting.    Vital Signs:  Patient profile:   65 year old female Height:      62 inches Weight:      138 pounds BMI:     25.33 Pulse rate:   67 / minute BP sitting:   142 / 82  (left arm) Cuff size:   large  Vitals Entered By: Oswald Hillock (Jul 09, 2009 3:01 PM)  Physical Exam  Additional Exam:  Patient is in NAD. HEENT:  Normocephalic,  atraumatic. EOMI, PERRLA.  Neck: JVP is normal. No thyromegaly. No bruits.  Lungs: clear to auscultation. No rales no wheezes.  Heart: Regular rate and rhythm. Normal S1, S2. No S3.   No significant murmurs. PMI not displaced.  Abdomen:  Supple, nontender. Normal bowel sounds. No masses. No hepatomegaly.  Extremities:   Good distal pulses throughout. No lower extremity edema.  Musculoskeletal :moving all extremities.  Neuro:   alert and oriented x3.    Impression & Recommendations:  Problem # 1:  CHEST PAIN (ICD-786.50) Atypical.  I have  discussed with Lamont Snowball.  He will contact the patient to set up appt.  Keep on same meds.  Activities as tolerated.  Problem # 2:  HYPERCHOLESTEROLEMIA, PURE (ICD-272.0) Repeat lipids today. Her updated medication list for this problem includes:    Simvastatin 20 Mg Tabs (Simvastatin) .Marland Kitchen... Take one tablet by mouth daily at bedtime  Problem # 3:  CARDIOMYOPATHY (ICD-425.4) Volume status looks good.  Continue meds.  LVEF 45 to 50% Her updated medication list for this problem includes:    Coreg 12.5 Mg Tabs (Carvedilol) ..... One tablet by mouth two times a day    Enalapril Maleate 10 Mg Tabs (Enalapril maleate) ..... One tablet by mouth once daily    Aspirin 81 Mg Tbec (Aspirin) .Marland Kitchen... Take one tablet by mouth daily  Problem # 4:  EROSIVE ESOPHAGITIS (ICD-530.19) Continue protonix. Her updated medication list for this problem includes:    Protonix 40 Mg Tbec (Pantoprazole sodium) .Marland Kitchen... 2 tablets by mouth two times a day

## 2010-04-09 NOTE — Letter (Signed)
Summary: [facility]   Imported By: Sherian Rein 11/15/2009 10:12:42  _____________________________________________________________________  External Attachment:    Type:   Image     Comment:   External Document

## 2010-04-09 NOTE — Progress Notes (Signed)
   Phone Note Outgoing Call   Call placed by: Meryl Dare MD Clementeen Graham,  October 19, 2009 4:29 PM Call placed to: Patient Summary of Call: Called pts home phone number at [phone] to see how she was feeling and recovering from her surgery. I spoke with Dr. Riley Kill earlier this week regarding his evaluation and received an office note from Dr. Dwain Sarna today summarizing his evaluation. No answer so I left a message to call us back if she was still having problems. Initial call taken by: Meryl Dare MD Clementeen Graham,  October 19, 2009 4:32 PM

## 2010-04-09 NOTE — Letter (Signed)
Summary: [facility]  [facility]   Imported By: Sherian Rein 10/09/2009 15:09:50  _____________________________________________________________________  External Attachment:    Type:   Image     Comment:   External Document

## 2010-04-09 NOTE — Op Note (Signed)
Summary: [facility]  [facility]   Imported By: Sherian Rein 10/09/2009 15:08:07  _____________________________________________________________________  External Attachment:    Type:   Image     Comment:   External Document

## 2010-04-09 NOTE — Progress Notes (Signed)
Summary: Reflux last night   Phone Note Call from Patient Call back at [phone] cell   Call For: Dr Russella Dar Reason for Call: Talk to Nurse Summary of Call: Wa changed to Dexilant back in May and she was doing great until last last night. Had really bad discomfort with reflux and wonders if she should take a Protonix together with the dexilant? Initial call taken by: Leanor Kail [facility],  August 14, 2009 9:34 AM  Follow-up for Phone Call        Patient  has been doing well on Dexilant, until last night.  She had episodes of grabing chest pain again.  She tried maalox and TUMS with no relief.  Patient  advised she can also try Pepcid otc when she has these episodes.  Patient  instructed to maintain an  anti-reflux diet. Advised to avoid caffeine, mint, citrus foods/juices, tomatoes, chocolate. Instructed not to eat within 2 hours of exercise or bed, small meals are better than 3 large. Need to take PPI 30 minutes prior to 1st meal of the day.  She is asked to keep her office visit with Dr Russella Dar for 08/29/09.  She is also instructed not to take Dexilant and Protonix. Follow-up by: Darcey Nora RN, CGRN,  August 14, 2009 10:02 AM  Additional Follow-up for Phone Call Additional follow up Details #1::        Agree Additional Follow-up by: Meryl Dare MD Clementeen Graham,  August 14, 2009 10:07 AM

## 2010-04-09 NOTE — Assessment & Plan Note (Signed)
Summary: follow up EGD/sheri   History of Present Illness Visit Type: Follow-up Visit Primary GI MD: Elie Goody MD [facility] Primary Provider: Roxy Manns, MD Requesting Provider: n/a Chief Complaint: Patient here following up from her EGD. Patient had some epigastric pain on Saturday and she took a Pepcid and it helped.  History of Present Illness:   This is a return office visit for erosive esophagitis. Her reflux symptoms are under much better control on Dexilant. She has had 2 occasions of breakthrough reflux symptoms that responded to Pepcid AC. She had several questions about long-term management of her disease, surgical hernia repair and fundoplication.   GI Review of Systems      Denies abdominal pain, acid reflux, belching, bloating, chest pain, dysphagia with liquids, dysphagia with solids, heartburn, loss of appetite, nausea, vomiting, vomiting blood, weight loss, and  weight gain.        Denies anal fissure, black tarry stools, change in bowel habit, constipation, diarrhea, diverticulosis, fecal incontinence, heme positive stool, hemorrhoids, irritable bowel syndrome, jaundice, light color stool, liver problems, rectal bleeding, and  rectal pain.   Current Medications (verified): 1)  Multivitamins   Tabs (Multiple Vitamin) .... One Tablet By Mouth Once Daily 2)  Coreg 12.5 Mg  Tabs (Carvedilol) .... One Tablet By Mouth Two Times A Day 3)  Os-Cal 500 + D 500-200 Mg-Unit  Tabs (Calcium Carbonate-Vitamin D) .... One Tablet By Mouth Two Times A Day 4)  Enalapril Maleate 10 Mg  Tabs (Enalapril Maleate) .... One Tablet By Mouth Once Daily 5)  Simvastatin 20 Mg Tabs (Simvastatin) .... Take One Tablet By Mouth Daily At Bedtime 6)  Aspirin 81 Mg Tbec (Aspirin) .... Take One Tablet By Mouth Daily 7)  Dexilant 60 Mg Cpdr (Dexlansoprazole) .Marland Kitchen.. 1 Tablet By Mouth Every Morning 8)  Zostavax 14782 Unt/0.72ml Solr (Zoster Vaccine Live) .... Inject Times One As Directed 9)  Pepcid Ac 10  Mg Tabs (Famotidine) .... Take One By Mouth As Needed  Allergies (verified): 1)  ! Penicillin 2)  ! Ceftin 3)  ! Prevacid (Lansoprazole) 4)  ! * Latex  Past History:  Past Medical History: 1. COLONIC POLYPS, HYPERPLASTIC, HX OF (ICD-V12.72) 2. MIGRAINE HEADACHE (ICD-346.90) 3. CARDIOMYOPATHY (ICD-425.4): Nonischemic.  Left heart cath 8/03 with no significant CAD.  EF moderate to severely depressed at one point but last echo in 5/08 showed EF 45-50% with peri-apical hypokinesis.  4. GERD with EROSIVE ESOPHAGITIS LA Classification Grade B 5.  Dyslipidemia 6. Osteopenia  7. Hiatal hernia  Past Surgical History: BTL Hysterectomy Osteopenia - dexa 5/10  Family History: Reviewed history from 07/18/2009 and no changes required. Family History of Heart Disease: Father Alzheimer's disease: Mother No FH of Colon Cancer:  Social History: Reviewed history from 10/11/2007 and no changes required. 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  Alcohol Use - yes-wine Illicit Drug Use - no Patient gets regular exercise.  Review of Systems       The pertinent positives and negatives are noted as above and in the HPI. All other ROS were reviewed and were negative.   Vital Signs:  Patient profile:   65 year old female Height:      62 inches Weight:      137.0 pounds BMI:     25.15 Pulse rate:   64 / minute Pulse rhythm:   regular BP sitting:   120 / 68  (left arm) Cuff size:   regular  Vitals Entered By: Harlow Mares CMA (AAMA) (  August 29, 2009 10:27 AM)  Physical Exam  General:  Well developed, well nourished, no acute distress. Head:  Normocephalic and atraumatic. Eyes:  PERRLA, no icterus. Mouth:  No deformity or lesions, dentition normal. Lungs:  Clear throughout to auscultation. Heart:  Regular rate and rhythm; no murmurs, rubs,  or bruits. Abdomen:  Soft, nontender and nondistended. No masses, hepatosplenomegaly or hernias noted. Normal bowel sounds. Psych:   Alert and cooperative. Normal mood and affect.  Impression & Recommendations:  Problem # 1:  EROSIVE ESOPHAGITIS (ICD-530.19) Continue Dexilant 60 mg q.a.m and anti-reflux measures. Pepcid AC b.i.d. p.r.n. for breakthrough symptoms. She may use Maalox, and/or TUMS as well,  however, she does not feel she has effective symptom relief with these medications. Discussed the possibility of laparoscopic fundoplication along with hiatal hernia repair as a potential option for long-term control if medications were not effective. Since her symptoms are under good control on the above regimen I do not feel that surgical management is indicated at this time and she would like to avoid surgery.   Patient Instructions: 1)  Please schedule a follow-up appointment in 6 months.  2)  Copy sent to : Dr. Roxy Manns 3)                         Dr. Dietrich Pates 4)  The medication list was reviewed and reconciled.  All changed / newly prescribed medications were explained.  A complete medication list was provided to the patient / caregiver.

## 2010-04-09 NOTE — Progress Notes (Signed)
Summary: Lab results   Phone Note Call from Patient Call back at [phone]   Caller: Mom Reason for Call: Lab or Test Results Initial call taken by: Judie Grieve,  Jul 16, 2009 10:15 AM  Follow-up for Phone Call        Called patient with lab results and copy mailed to her home. Follow-up by: Suzan Garibaldi RN

## 2010-04-09 NOTE — Letter (Signed)
Summary: Appt Reminder 2  [facility]  [address] [place], [place] [address]   Phone: [phone]  Fax: [phone]        Jul 20, 2009 MRN: [id number]    [facility] [address] [place], [place]  [address]    Dear Ms. Henri,   You have a return appointment with Dr. Russella Dar on 08/29/09 at 10:15 am.  Please remember to bring a complete list of the medicines you are taking, your insurance card and your co-pay.  If you have to cancel or reschedule this appointment, please call before 5:00 pm the evening before to avoid a cancellation fee.  If you have any questions or concerns, please call [phone].    Sincerely,    Darcey Nora RN, CGRN  Appended Document: Appt Reminder 2 letter mailed to patient's home

## 2010-04-09 NOTE — Letter (Signed)
Summary: Results Follow up Letter  [place] [facility]  [address] [place], [place] [address]   Phone: [phone]  Fax: [phone]    07/16/2009 MRN: [id number]     [facility] [place] [address] [place], [place]  [address]    Dear Ms. Dimario,  The following are the results of your recent test(s):  Test         Result    Pap Smear:        Normal _____  Not Normal _____ Comments: ______________________________________________________ Cholesterol: LDL(Bad cholesterol):         Your goal is less than:         HDL (Good cholesterol):       Your goal is more than: Comments:  ______________________________________________________ Mammogram:        Normal __x___  Not Normal _____ Comments:Repeat in 1 year  ___________________________________________________________________ Hemoccult:        Normal _____  Not normal _______ Comments:    _____________________________________________________________________ Other Tests:    We routinely do not discuss normal results over the telephone.  If you desire a copy of the results, or you have any questions about this information we can discuss them at your next office visit.   Sincerely,  Roxy Manns MD

## 2010-04-11 NOTE — Letter (Signed)
Summary: Office Visit Letter  [facility]  [address] [place], [place] [address]   Phone: [phone]  Fax: [phone]      February 19, 2010 MRN: [id number]   [facility] [place] [address] [place], [place]  [address]   Dear [name],   According to our records, it is time for you to schedule a follow-up office visit with [place].   At your convenience, please call [phone] (option #2)to schedule an office visit. If you have any questions, concerns, or feel that this letter is in error, we would appreciate your call.   Sincerely,    Judie Petit T. Russella Dar, M.D.  [facility] [phone]

## 2010-04-11 NOTE — Letter (Signed)
Summary: [facility]  [facility]   Imported By: Lanelle Bal 02/22/2010 09:56:51  _____________________________________________________________________  External Attachment:    Type:   Image     Comment:   External Document

## 2010-05-07 NOTE — Consult Note (Signed)
Summary: Consultation Report  Consultation Report   Imported By: Earl Many 04/26/2010 18:21:01  _____________________________________________________________________  External Attachment:    Type:   Image     Comment:   External Document

## 2010-05-31 ENCOUNTER — Ambulatory Visit (INDEPENDENT_AMBULATORY_CARE_PROVIDER_SITE_OTHER): Payer: Medicare Other | Admitting: Internal Medicine

## 2010-05-31 DIAGNOSIS — E876 Hypokalemia: Secondary | ICD-10-CM

## 2010-05-31 DIAGNOSIS — E7800 Pure hypercholesterolemia, unspecified: Secondary | ICD-10-CM

## 2010-05-31 DIAGNOSIS — I509 Heart failure, unspecified: Secondary | ICD-10-CM

## 2010-05-31 DIAGNOSIS — I428 Other cardiomyopathies: Secondary | ICD-10-CM

## 2010-05-31 MED ORDER — CARVEDILOL 12.5 MG PO TABS: 12.5000 mg | ORAL_TABLET | Freq: Two times a day (BID) | ORAL | Status: DC

## 2010-05-31 MED ORDER — SIMVASTATIN 20 MG PO TABS: 20.0000 mg | ORAL_TABLET | Freq: Every day | ORAL | Status: DC

## 2010-05-31 MED ORDER — ENALAPRIL MALEATE 10 MG PO TABS: 10.0000 mg | ORAL_TABLET | Freq: Every day | ORAL | Status: DC

## 2010-05-31 NOTE — Patient Instructions (Addendum)
Your physician recommends that you schedule a follow-up appointment in: December 2012 Have fasting lab work drawn at [facility] on July 02, 2010

## 2010-05-31 NOTE — Progress Notes (Signed)
HPI This nice woman has a nonischemic CM, with EF previously of 20% and normal coronaries, and has been followed by Dr. Tenny Craw.  EF improved after initiation of therapy.  Last summer she had surgery for ruptured appendix.  Post op she developed severe edema.  She was seen by T. Riley Kill.  Echo was done that showed LVEF was 50 to 55%.  Labs showed only mild elev of BNP.  She was told to take lasix as needed for edema. Since then she has done well.  Swelling resolved.   She remains very acitve caring for her grandchildren.   She denies chest pain.  Does have some palpitations like she had in the past.  Short lived.  Not destabilizing.   Allergies  Allergen Reactions  . Cefuroxime Axetil   . Lansoprazole     REACTION: headaches  . Latex   . Penicillins     Current Outpatient Prescriptions  Medication Sig Dispense Refill  . calcium citrate (CALCITRATE - DOSED IN MG ELEMENTAL CALCIUM) 950 MG tablet Take 1 tablet by mouth 2 (two) times daily.        . carvedilol (COREG) 12.5 MG tablet Take 12.5 mg by mouth 2 (two) times daily with a meal.        . Cholecalciferol (VITAMIN D-3 PO) Take by mouth.        . enalapril (VASOTEC) 10 MG tablet Take 10 mg by mouth daily.        . Multiple Vitamin (MULTIVITAMIN) capsule Take 1 capsule by mouth daily.        . pantoprazole (PROTONIX) 40 MG tablet Take 40 mg by mouth 2 (two) times daily.        . simvastatin (ZOCOR) 20 MG tablet Take 20 mg by mouth at bedtime.          No past medical history on file.  No past surgical history on file.  No family history on file.  History   Social History  . Marital Status: Married    Spouse Name: N/A    Number of Children: N/A  . Years of Education: N/A   Occupational History  . Not on file.   Social History Main Topics  . Smoking status: Not on file  . Smokeless tobacco: Not on file  . Alcohol Use: Not on file  . Drug Use: Not on file  . Sexually Active: Not on file   Other Topics Concern  . Not on  file   Social History Narrative  . No narrative on file    Review of Systems:  All systems reviewed.  They are negative to the above problem except as previously stated.  Vital Signs: BP 118/67  Pulse 69  Ht 5\' 2"  (1.575 m)  Wt 139 lb (63.05 kg)  BMI 25.42 kg/m2  Physical Exam  HEENT:  Normocephalic, atraumatic. EOMI, PERRLA.  Neck: JVP is normal. No thyromegaly. No bruits.  Lungs: clear to auscultation. No rales no wheezes.  Heart: Regular rate and rhythm. Normal S1, S2. No S3.   No significant murmurs. PMI not displaced.  Abdomen:  Supple, nontender. Normal bowel sounds. No masses. No hepatomegaly.  Extremities:   Good distal pulses throughout. No lower extremity edema.  Musculoskeletal :moving all extremities.  Neuro:   alert and oriented x3.  CN II-XII grossly intact.   Assessment and Plan:

## 2010-06-02 NOTE — Assessment & Plan Note (Signed)
Keep on same regimen. 

## 2010-06-02 NOTE — Assessment & Plan Note (Signed)
Set up for BMET

## 2010-06-02 NOTE — Assessment & Plan Note (Signed)
Will set up for fasting lipids. 

## 2010-07-02 ENCOUNTER — Ambulatory Visit: Payer: Self-pay | Admitting: Gastroenterology

## 2010-07-02 ENCOUNTER — Other Ambulatory Visit: Payer: Medicare Other

## 2010-07-15 ENCOUNTER — Encounter: Payer: Self-pay | Admitting: Gastroenterology

## 2010-07-15 ENCOUNTER — Ambulatory Visit (INDEPENDENT_AMBULATORY_CARE_PROVIDER_SITE_OTHER): Payer: Medicare Other | Admitting: Gastroenterology

## 2010-07-15 ENCOUNTER — Ambulatory Visit: Payer: Self-pay | Admitting: Gastroenterology

## 2010-07-15 VITALS — BP 96/64 | HR 60 | Ht 61.0 in | Wt 138.4 lb

## 2010-07-15 DIAGNOSIS — K2100 Gastro-esophageal reflux disease with esophagitis, without bleeding: Secondary | ICD-10-CM

## 2010-07-15 DIAGNOSIS — Z1211 Encounter for screening for malignant neoplasm of colon: Secondary | ICD-10-CM

## 2010-07-15 MED ORDER — PANTOPRAZOLE SODIUM 40 MG PO TBEC: 40.0000 mg | DELAYED_RELEASE_TABLET | Freq: Two times a day (BID) | ORAL | Status: DC

## 2010-07-15 NOTE — Progress Notes (Signed)
History of Present Illness: This is a 65 year old female returns today for followup of erosive esophagitis and reflux symptoms. Her symptoms have been under excellent control on pantoprazole 40 mg twice daily. She relates only 1 or 2 episodes of significant breakthrough in the past few months. She has no gastrointestinal complaints today. He is denies nausea, vomiting, chest pain, abdominal pain, dysphagia, odynophagia, melena, hematochezia, change in bowel habits, constipation or diarrhea.  Current Medications, Allergies, Past Medical History, Past Surgical History, Family History and Social History were reviewed in [employer] record.  Physical Exam: General: Well developed , well nourished, no acute distress Head: Normocephalic and atraumatic Eyes:  sclerae anicteric, EOMI Ears: Normal auditory acuity Mouth: No deformity or lesions Lungs: Clear throughout to auscultation Heart: Regular rate and rhythm; no murmurs, rubs or bruits Abdomen: Soft, non tender and non distended. No masses, hepatosplenomegaly or hernias noted. Normal Bowel sounds Musculoskeletal: Symmetrical with no gross deformities  Pulses:  Normal pulses noted Extremities: No clubbing, cyanosis, edema or deformities noted Neurological: Alert oriented x 4, grossly nonfocal Psychological:  Alert and cooperative. Normal mood and affect  Assessment and Recommendations:  1. GERD with a history of erosive esophagitis. Maintain standard antireflux measures long term. Refill pantoprazole 40 mg by mouth twice a day for long-term management. Return office visit one year.  Next number colorectal cancer screening. Average risk. Screening colonoscopy at 10 years is due in July 2013.

## 2010-07-15 NOTE — Patient Instructions (Signed)
Your prescription has been sent to your pharmacy 

## 2010-07-23 NOTE — Assessment & Plan Note (Signed)
[facility]                         GASTROENTEROLOGY OFFICE NOTE   NAME:LITTLEAlexandra, Lipps                       MRN:          [id number]  DATE:10/05/2006                            DOB:          1945/09/15    Mrs. Brackeen returns for followup of GERD. She states she has  breakthrough symptoms that are occurring on a weekly basis. Her  breakthrough symptoms tend to bother her for almost an entire day and  they are partially relieved with Tums but she uses Tums several times  throughout that day without complete relief. She notes no dysphagia,  odynophagia, weight loss, abdominal pain, change in bowel habits, melena  or hematochezia.   CURRENT MEDICATIONS:  Listed on the chart updated and reviewed.   MEDICATION ALLERGIES:  PENICILLIN and CEFTIN.   PHYSICAL EXAMINATION:  GENERAL:  No acute distress.  VITAL SIGNS:  Weight 129.8 pounds. Blood pressure is 124/66, pulse 64  and regular.  CHEST:  Clear to auscultation bilaterally.  CARDIAC:  Regular rate and rhythm without murmurs.  ABDOMEN:  Soft, nontender, normal active bowel sounds, no palpable  organomegaly, masses or hernias.   ASSESSMENT/PLAN:  1. Gastroesophageal reflux disease with a history of LA classification      grade B erosive esophagitis. She has frequent breakthrough      symptoms. I have advised her to re-intensify all antireflux      measures. Increase Pantoprazole to 40 mg twice a day for the next 4-      8 weeks and then she may use Pantoprazole either once or twice a      day to adequately control symptoms. She may need to remain on twice      a day therapy long term to achieve adequate symptom control. Plan      for return office visit in 1 year.  2. Colorectal cancer screening. She would like to remain on a 7 year      recall interval even though she only had a hyperplastic polyp. We      will plan for a followup colonoscopy in July 2010.     Venita Lick. Russella Dar, MD, [facility]  Electronically Signed    MTS/MedQ  DD: 10/05/2006  DT: 10/06/2006  Job #: [id number]

## 2010-07-23 NOTE — Assessment & Plan Note (Signed)
[facility]                            CARDIOLOGY OFFICE NOTE   NAME:LITTLERaisha, Brabender                       MRN:          [id number]  DATE:12/09/2007                            DOB:          08-23-1945    IDENTIFICATION:  Ms. Chrestman is a 65 year old woman with a nonischemic  cardiomyopathy, mild LV dysfunction.  She also has a history of  dyslipidemia.  I last saw her in March.   A couple weeks ago, the patient called, it was on the weekend.  She was  in the yard and noticed some chest tightness, went in, sat down, and she  had noticed palpitations like her heart was flip-flopping.   When we heard this, we had her come in and have some electrolytes  checked as well as a thyroid which were normal, also set her up for an  event monitor.  She wore the monitor for several days, has a problem  because of LATEX allergy with skin breakdown.  Unfortunately, she did  not have any significant symptoms while she was wearing it.  The monitor  showed only occasional PVCs.   She does note that her heart is fluttering a few beats occasionally.  No  dizziness.  She just remarks on it.  No more chest tightness.   Her current medicines include Centrum Silver, Protonix, Coreg 12.5  b.i.d., Os-Cal with D b.i.d., enalapril 10, simvastatin 20.   PHYSICAL EXAMINATION:  GENERAL:  The patient is in no distress at rest.  VITAL SIGNS:  Blood pressure is 121/68, pulse is 71 and regular, weight  135 up from 134 on last visit.  NECK:  No JVD.  LUNGS:  Clear.  No rales.  CARDIAC:  Regular rate and rhythm.  S1 and S2.  No definite S3.  No  significant murmurs.  No skips noted.  ABDOMEN:  No hepatomegaly.  EXTREMITIES:  No edema.  Good pulses distally.   IMPRESSION:  1. Fluttering.  I do not see anything significant.  She denies any      caffeine intake, not overly tired on the day this happened.  I      would continue to follow.  We would recommend with transient  tightness.  We will go ahead and repeat an echocardiogram to      reconfirm her left ventricle is only mildly depressed in function.      I would keep her on current regimen.  2. Dyslipidemia.  Again, keep on current regimen.  Lipids earlier this      year were very good.   I will set to see her in 6 months, sooner if problems develop.  I will  be in touch with the test results.  She was given a flu shot today (had  a reaction local, reaction to the flu shot years ago, has not had the  flu shot since).  We recommended giving it and observing.     Pricilla Riffle, MD, [facility]  Electronically Signed    PVR/MedQ  DD: 12/09/2007  DT: 12/10/2007  Job #: [id number]   cc:  [name], MD

## 2010-07-23 NOTE — Assessment & Plan Note (Signed)
[facility]                            CARDIOLOGY OFFICE NOTE   NAME:[name], [name]                       MRN:          [id number]  DATE:08/06/2006                            DOB:          November 17, 1945    IDENTIFICATION:  The patient is a 65 year old woman who has a history of  a non-ischemic cardiomyopathy. She was last seen in cardiology back in  May 2007.   In the interval, she has done. She had THBSO last summer. Complicated by  infection, but otherwise went okay. She has had some fatigue, but is  very active.   She denies significant palpitations as she has had in the past.   CURRENT MEDICATIONS:  1. Centrum Silver daily.  2. Protonix 40 mg daily.  3. Lipitor 10 mg daily.  4. Coreg 12.5 mg b.i.d.  5. Os-Cal with D 50 b.i.d.  6. Enalapril 10 mg daily.   PHYSICAL EXAMINATION:  GENERAL:  The patient is no distress.  VITAL SIGNS:  Blood pressure 122/69, pulse 71 and regular, weight 132  down from 135 in July.  LUNGS:  Clear.  NECK:  JVP is normal.  CARDIAC:  Regular rate and rhythm, S1, S2, no S3. No murmurs.  ABDOMEN:  Benign.  EXTREMITIES:  No significant edema, recent thumb surgery.   IMPRESSION:  1. Non-ischemic cardiomyopathy. She just had an echocardiogram done a      few days ago. LVF was 45-50%. I cannot pull up the other      echocardiogram and I will need to have it pulled from our      warehouse. My impression, though, is that there does not appear to      be significant change. Would continue on current regimen.  2. Dyslipidemia. Will need to get fasting lipid panel done at [facility].   I would not change her regimen. I encouraged her to stay active. I will  see her back in nine months time, sooner if problems develop.   ADDENDUM   12 lead EKG, sinus rhythm, 63 beats-per-minute, left bundle branch  block, unchanged.     Pricilla Riffle, MD, [facility]  Electronically Signed    PVR/MedQ  DD: 08/06/2006  DT:  08/07/2006  Job #: [id number]   cc:   [name], MD

## 2010-07-23 NOTE — Assessment & Plan Note (Signed)
[facility]                            CARDIOLOGY OFFICE NOTE   NAME:[name], [name]                       MRN:          [id number]  DATE:06/03/2007                            DOB:          1946-03-09    IDENTIFICATION:  [name] is a 65 year old woman with nonischemic  cardiomyopathy and mild LV dysfunction as well as dyslipidemia.  I last  saw her back in May of last year.  She also has a chronic left bundle  branch block.   Since seen she has been quite busy taking care of her grandchildren at  times.  Her breathing has been good.  She still has intermittent reflux  and is concerned about this.  She notes she has been seen by Claudette Head in the past.  She is trying eat foods that do not contribute to  this.   Her current medicines include Centrum Silver, Protonix 40, Coreg 12.5  b.i.d., Os-Cal with D 500 b.i.d., enalapril 10 and Lipitor 10.   PHYSICAL EXAM:  The patient is in no distress.  Blood pressure is  120/78, pulse is 76 and regular.  Weight 134.  Her lungs are clear.  NECK:  JVP is normal.  No bruits.  CARDIAC EXAM:  Regular rate and rhythm, S1, S2, no S3, no murmurs.  ABDOMEN:  Benign.  EXTREMITIES:  Good distal pulses.  No edema.   EKG:  12-lead EKG normal sinus rhythm with PACs, 77 beats per minute.  Left bundle branch block.   IMPRESSION:  1. Nonischemic cardiomyopathy.  Last echocardiogram done in May 2008      showed only mild left ventricular dysfunction with an LVEF of 45-      50%, hypokinesis of the peri apical and septal walls.  I would      continue on the current regimen.  She has done well.  Note, this is      improved from echoes from 2003 when her LVEF was 35-45%.  I have      discussed this with her.  2. Dyslipidemia, doing well on the Lipitor; actually great, though her      triglycerides have been up and down over the years from the 100s to      the 200s.  Her last lipid panel done in February showed a  total      cholesterol 143, LDL of 55, HDL of 44, triglycerides of 221.  I      would watch her carbohydrates.  She is eager to switch to a generic      and I have written for simvastatin 20.  I think, again, primary      prevention in her case is important as her reserve is down.  3. History of gastrointestinal reflux.  I encouraged her to get back      in touch with Dr. Russella Dar as she is still having breakthrough.   Otherwise I will set to see her back in 1 year.  Will follow up lipids  on the switch.  She will have them drawn at Dr. Royden Purl office  and I  will look at them and get back in touch with her.  Refills were given  today.     Pricilla Riffle, MD, [facility]  Electronically Signed    PVR/MedQ  DD: 06/03/2007  DT: 06/04/2007  Job #: [id number]   cc:   [name], MD  Venita Lick. Russella Dar, MD, [facility]

## 2010-07-26 NOTE — Cardiovascular Report (Signed)
[place]  Patient:    [name], [name] Visit Number: [id number] MRN: [id number]          Service Type: CAT Location: 6500 [id number] Attending Physician:  Lenoria Farrier Dictated by:   Everardo Beals Juanda Chance, M.D. [facility] Proc. Date: 06/15/01 Admit Date:  06/15/2001 Discharge Date: 06/16/2001   CC:         Roxy Manns, M.D. [facility]  Malcolm T. [name]., M.D. [facility]  Dietrich Pates, M.D. [facility]  Cardiopulmonary Lab   Cardiac Catheterization  CLINICAL HISTORY:  [name] is 65 years old and has no prior history of known heart disease.  She was recently seen by Dr. Russella Dar for GI evaluation, who obtained a history of chest pain and referred her to Dr. Tenny Craw.  Dr. Tenny Craw did a Cardiolite scan which suggested apical ischemia and depressed ejection fraction of 32%.  She also did an echocardiogram which showed ejection fraction of 40%.  Based on this, the patient was scheduled for evaluation with angiography.  DESCRIPTION OF PROCEDURE:  The procedure was performed by the right femoral artery with arterial sheath and 6-French preformed coronary catheters.  A front wall arterial puncture was performed and Omnipaque contrast was used. We tool a femoral angiogram to see if her RV was suitable for closing with Perclose, but the anatomy was not suitable for Perclose.  The patient tolerated the procedure well and left the laboratory in satisfactory condition.  RESULTS: 1. LEFT MAIN CORONARY ARTERY:  Free of significant disease. 2. LEFT ANTERIOR DESCENDING ARTERY:  Gave rise to two diagonal branches and    three septal perforators.  These and the LAD proper were free of    significant disease. 3. CIRCUMFLEX ARTERY:  Gave rise to an atrial branch, a marginal branch, and    two posterolateral branches.  These vessels were free of significant    disease. 4. RIGHT CORONARY ARTERY:  A dominant vessel that gave rise to a conus branch    right ventricular branch, a posterior  descending branch and two    posterolateral branches.  These vessels are free of significant    disease.  LEFT VENTRICULOGRAM:  Performed in the RAO projection, showed global hypokinesis, with a calculated ejection fraction of 29%.  The left ventriculogram performed in the LAO projection showed paradoxical motion of the septum, with the lateral wall and septum moving in the same direction.  The apex appeared to akinetic.  PRESSURES: 1. Left ventricular:  146/15. 2. Aortic:  146/91 with a mean of 115.  CONCLUSIONS: 1. Normal coronary angiography. 2. Severe left ventricular dysfunction, with ejection fraction of 29%.  RECOMMENDATIONS:  The left ventricular function appears to be more global than described on the echocardiogram and Cardiolite scans.  There is no coronary obstructive disease to explain this, and I suspect she has a nonischemic cardiomyopathy.  The etiology of this is not clear.  Will plan to start the patient on an ACE inhibitor and make her an outpatient today.  Arrange for her to see Dr. Dietrich Pates back in follow-up tomorrow. Dictated by:   Everardo Beals Juanda Chance, M.D. [facility] Attending Physician:  Lenoria Farrier DD:  06/15/01 TD:  06/16/01 Job: [id number]

## 2010-07-26 NOTE — Discharge Summary (Signed)
[place]  Patient:    [name], [name] Visit Number: [id number] MRN: [id number]          Service Type: CAT Location: 6500 6524 [id number] Attending Physician:  Lenoria Farrier Dictated by:   Rozell Searing, P.A. Admit Date:  06/15/2001 Discharge Date: 06/16/2001   CC:         Venita Lick. [name]., M.D. [facility]  Roxy Manns, M.D. [facility]   Referring Physician Discharge [place]  PROCEDURES:  Coronary angiography.  REASON FOR ADMISSION:  Please refer to dictated admission note.  LABORATORY DATA:  ESR 13.  AST 41, ALT 54.  CPK 49/1.3, troponin I 0.02.  CRP 0.127.  RA factor less than 20.  ANA pending.  HOSPITAL COURSE:  Patient was admitted for elective coronary angiography performed by Dr. Ephraim Hamburger on June 15, 2001 (see catheterization report for full details) revealing normal coronary arteries and moderate LV dysfunction (EF 29%) with global hypokinesis.  Dr. Juanda Chance concluded that this represented a nonischemic cardiomyopathy of unknown etiology.  Initial plans were to initiate treatment with ACE inhibitors to be followed by beta blockers.  Patient did receive a starting dose of Altace 2.5; however, she developed subsequent hypotension some two hours later (SBP 74) and was kept for overnight observation and treatment with hydration.  Blood pressures improved to the 100-110 range systolic.  Final recommendations by Dr. Juanda Chance were to defer continuation of Altace until cleared by Dr. Dietrich Pates.  Patient was cleared for discharge the morning following the procedure in hemodynamically-stable condition.  There were no evident complications of the catheterization site upon physical examination.  Patient also cited data in the PDR that Aciphex can induce chest pain.  We did a brief literature survey of other proton pump inhibitors and found similar adverse effects.  Dr. Juanda Chance therefore recommended holding off on these agents as well, and recommended use  of antacids (i.e. Mylanta) for epigastric discomfort.  Patient also has history of migraines and had been on both Topamax and imipramine.  We have also recommended holding off on these agents as well until further clarification at time of follow-up.  MEDICATIONS AT DISCHARGE:  None.  INSTRUCTIONS:  ACTIVITY:  No heavy lifting, strenuous activity, or driving x 2 days.  WOUND CARE:  Call the office if there is any swelling/bleeding of the groin.  FOLLOW-UP:  Patient is scheduled to follow up with Dr. Dietrich Pates on Thursday, June 17, 2001 at 3:30 p.m.  Patient is also scheduled for an esophagogastroduodenoscopy with Dr. Claudette Head on Jul 14, 2001.  DISCHARGE DIAGNOSES: 1. Nonischemic cardiomyopathy.    Normal coronary arteries/moderate left ventricular dysfunction (ejection    fraction 29%; global hypokinesis) - cardiac catheterization June 15, 2001. 2. Status post hypotension. 3. Chronic left bundle-branch block. 4. History of migraines. Dictated by:   Rozell Searing, P.A. Attending Physician:  Lenoria Farrier DD:  06/16/01 TD:  06/16/01 Job: [id number]

## 2010-07-26 NOTE — Discharge Summary (Signed)
[name], [name]                ACCOUNT NO.:  [id number]   MEDICAL RECORD NO.:  [id number]          PATIENT TYPE:  OIB   LOCATION:  9320                          FACILITY:  [facility]   PHYSICIAN:  Michelle L. Grewal, M.D.DATE OF BIRTH:  Jun 22, 1945   DATE OF ADMISSION:  08/20/2005  DATE OF DISCHARGE:  08/21/2005                                 DISCHARGE SUMMARY   ADMISSION DIAGNOSIS:  Pelvic relaxation.   DISCHARGE DIAGNOSIS:  Pelvic relaxation.   HOSPITAL COURSE:  This is a 65 year old, G3, P3 with pelvic relaxation.  She  denied genuine stress urinary incontinence.  Urodynamics preop did not  demonstrate any leakage. On the day of admission, the patient underwent  LAVH, BSO, anterior and posterior repair. EBL was 200 mL. On postop day #1,  she was doing very well.  She had had some nausea but it resolved with the  usual measures. By the afternoon of postop day #1, she was ambulating well,  voiding on her own, afebrile with stable vital signs with minimal bleeding,  tolerating regular food and had no nausea.  She was discharged home with  Tylox to take as needed for pain.  She was advised no driving for 2 weeks.  She was advised no intercourse for 6 weeks.  She was advised to call if she  has any temperature greater than 100.5, heavy vaginal bleeding, nausea,  vomiting or inability to urinate or have a bowel movement.  She will follow  up in the office in 4 weeks. Her discharge hemoglobin was 9.9 which was  performed on postop day #1.      Michelle L. Vincente Poli, M.D.  Electronically Signed     MLG/MEDQ  D:  08/27/2005  T:  08/27/2005  Job:  [id number]

## 2010-07-26 NOTE — Assessment & Plan Note (Signed)
[facility]                           GASTROENTEROLOGY OFFICE NOTE   NAME:[name], [name]                       MRN:          [id number]  DATE:09/30/2005                            DOB:          03/17/1945    Return office visit for GERD.  She states she has breakthrough symptoms  about twice a month.  She takes Tums for these symptoms, and they are  occasionally effective.  Sometimes her symptoms persist for an hour or so.  She occasionally uses her Protonix twice a day, but she does this fairly  infrequently.  She has no dysphagia, odynophagia, chest pain, abdominal  pain, weight loss, or gastrointestinal bleeding.   CURRENT MEDICATIONS:  Listed on the chart and have been reviewed.   MEDICATION ALLERGIES:  PENICILLIN, CEFTIN.   PHYSICAL EXAMINATION:  GENERAL:  No acute distress.  VITAL SIGNS:  Weight 135 pounds.  Blood pressure is 128/64.  Pulse 68 and  regular.  HEENT:  Anicteric sclerae.  Oropharynx clear.  CHEST:  Clear to auscultation bilaterally.  CARDIAC:  Regular rate and rhythm without murmurs appreciated.  ABDOMEN:  Soft, nontender, nondistended.  Normal active bowel sounds.  No  palpable organomegaly, masses, or hernias.   ASSESSMENT AND PLAN:  Gastroesophageal reflux disease.  Symptoms well  controlled on current regimen.  Continue Protonix 30 mg orally every  morning.  Refills for one year prescribed.  Use Tums as needed for  breakthrough symptoms.  Standard antireflux measures discussed with the  patient.  Return office visit one year.                                   Venita Lick. [name]., MD, [facility]   MTS/MedQ  DD:  09/30/2005  DT:  09/30/2005  Job #:  [id number]   cc:   [name], MD

## 2010-07-26 NOTE — Op Note (Signed)
[name], [name]                ACCOUNT NO.:  [id number]   MEDICAL RECORD NO.:  [id number]          PATIENT TYPE:  AMB   LOCATION:  SDC                           FACILITY:  [facility]   PHYSICIAN:  Michelle L. Grewal, M.D.DATE OF BIRTH:  [birth year]   DATE OF PROCEDURE:  08/20/2005  DATE OF DISCHARGE:                                 OPERATIVE REPORT   PREOPERATIVE DIAGNOSIS:  Pelvic relaxation.   POSTOPERATIVE DIAGNOSIS:  Pelvic relaxation.   PROCEDURE:  Laparoscopic-assisted vaginal hysterectomy, bilateral salpingo-  oophorectomy, anterior and posterior repair.   SURGEON:  Dr. Vincente Poli.   ASSISTANT:  Dr. Tenny Craw.   ANESTHESIA:  General.   SPECIMENS:  Uterus, cervix, tubes and ovaries.  Estimated blood loss 200 mL.   COMPLICATIONS:  None.   PROCEDURE:  The patient as taken to the operating room.  She was intubated  by anesthesia without difficulty.  She was then prepped and draped in usual  sterile fashion.  Foley catheter was inserted.  Exam under anesthesia  revealed a grade 2 to 3 uterine prolapse, grade 2 to 3 cystocele and a grade  1 to 2 rectocele room.  Uterine manipulator was inserted, and a small  incision was made at the umbilicus, and the Veress needle was inserted.  Pneumoperitoneum was achieved.  We then inserted the 11-mm trocar.  The  patient was placed in Trendelenburg position, and a laparoscopy revealed a  normal pelvis and normal adnexa and normal upper abdomen.  We then placed a  secondary 5-mm trocar suprapubically.  We then identified our ovary and  identified the infundibular pelvic ligament using the gyrus instrument.  We  placed it just beside the ovary and burned and then transected each IP  ligament.  This was done with no bleeding whatsoever.  This was carried down  to the round ligament on either side with excellent hemostasis.  At this  point, we then removed the gyrus and released the pneumoperitoneum and  proceeded vaginally.  The cervix was grasped  with a tenaculum.  A  circumferential incision was made around the cervix, and the posterior cul-  de-sac was entered.  The anterior cul-de-sac was then entered sharply as  well.  We then placed curved Heaney clamps just beside the uterosacral  cardinal ligaments on either side.  Each pedicle was cut and suture ligated  using 0 Vicryl suture.  We walked our way up the broad ligament and also  stayed just beside the uterus.  Each pedicle was suture ligated using 0  Vicryl suture.  Once we reached the level of the fundus, the uterus was  retroflexed, and the remainder of the broad ligament was clamped on either  side using curved Heaney clamps and suture ligated using 0 Vicryl suture.  Hemostasis was again excellent.  There was no active bleeding from any site  noted.  A small cul-de-sac stitch was placed using 0 Vicryl suture to  prevent future enterocele development.  The posterior 2/3 of the cuff was  then closed in continuous running locked stitch using 0 Vicryl suture.  Hemostasis was again excellent.  At this point, Allis clamps were placed in  the vaginal apex on either side just beside the cystocele, and a midline  incision was made in the anterior vaginal vault using Metzenbaum scissors.  The underlying vesicovaginal fascia was dissected free from the overlying  vaginal epithelium.  We then used sharp and blunt dissection and reduced the  cystocele very nicely.  Cystocele was then reduced using 2 pursestring  sutures using 0 Vicryl suture.  The redundant vaginal epithelium was  trimmed, and anterior vaginal vault was closed from the anterior all the way  to where it met at the vaginal cuff using a continuous running locked stitch  using 2-0 Vicryl suture.  Hemostasis was excellent.  At this point, she had  excellent suspension of the anterior vaginal vault as well as the vaginal  cuff.  At this point, we decided to proceed with a posterior repair where  Allis clamps were placed at 5  and 7 o'clock, a V-shaped incision was made in  the perineum and a midline incision was made all the way up the posterior  vaginal wall.  The rectocele was reduced using sharp and blunt dissection.  We then trimmed the redundant vaginal epithelium.  I then closed the  rectocele using interrupted, starting distally and moving proximally using 0  Vicryl suture.  The vaginal epithelium was closed in a continuous running  locked stitch using 2-0 Vicryl suture, and the perineum was closed as well  using the same suture.  At the end of the procedure, there was no active  vaginal bleeding.  The vaginal packing with Estrace cream was inserted into  the vagina.  At this point, we changed our gloves and proceeded to look  laparoscopically.  The pneumoperitoneum was then performed again, and the  patient was gently placed in Trendelenburg position, and the pelvis was  irrigated.  No active bleeding was noted anywhere.  Hemostasis was  excellent.  We then released the pneumoperitoneum, the trocars were removed  from the abdominal wall and the incisions on the abdominal wall was closed  with skin adhesive.  All sponge, lap and instrument counts were correct x2.  The patient was extubated by anesthesia went to recovery room in stable  condition.      Michelle L. Vincente Poli, M.D.  Electronically Signed     MLG/MEDQ  D:  08/20/2005  T:  08/20/2005  Job:  [id number]

## 2010-07-29 ENCOUNTER — Ambulatory Visit: Payer: Self-pay | Admitting: Gastroenterology

## 2011-03-26 ENCOUNTER — Telehealth: Payer: Self-pay | Admitting: Gastroenterology

## 2011-03-26 NOTE — Telephone Encounter (Signed)
Patient c/o abdominal pain and would like an [place].  She is advised she will need an appt.  She is scheduled to see Dr Russella Dar tomorrow at 9:30

## 2011-03-27 ENCOUNTER — Ambulatory Visit (INDEPENDENT_AMBULATORY_CARE_PROVIDER_SITE_OTHER): Payer: Medicare Other | Admitting: Gastroenterology

## 2011-03-27 ENCOUNTER — Encounter: Payer: Self-pay | Admitting: Gastroenterology

## 2011-03-27 ENCOUNTER — Other Ambulatory Visit (INDEPENDENT_AMBULATORY_CARE_PROVIDER_SITE_OTHER): Payer: Medicare Other

## 2011-03-27 VITALS — BP 128/72 | HR 60 | Ht 62.0 in | Wt 140.0 lb

## 2011-03-27 DIAGNOSIS — Z79899 Other long term (current) drug therapy: Secondary | ICD-10-CM

## 2011-03-27 DIAGNOSIS — R1011 Right upper quadrant pain: Secondary | ICD-10-CM

## 2011-03-27 DIAGNOSIS — R079 Chest pain, unspecified: Secondary | ICD-10-CM

## 2011-03-27 LAB — CBC WITH DIFFERENTIAL/PLATELET
Basophils Absolute: 0 10*3/uL (ref 0.0–0.1)
Eosinophils Absolute: 0.2 10*3/uL (ref 0.0–0.7)
HCT: 37.6 % (ref 36.0–46.0)
Lymphs Abs: 1.3 10*3/uL (ref 0.7–4.0)
MCHC: 35.2 g/dL (ref 30.0–36.0)
MCV: 87.2 fl (ref 78.0–100.0)
Monocytes Absolute: 0.4 10*3/uL (ref 0.1–1.0)
Platelets: 224 10*3/uL (ref 150.0–400.0)
RDW: 12.5 % (ref 11.5–14.6)

## 2011-03-27 LAB — BASIC METABOLIC PANEL
GFR: 96.74 mL/min (ref >60.00)
Glucose, Bld: 96 mg/dL (ref 70–99)
Potassium: 4.3 mEq/L (ref 3.5–5.1)
Sodium: 139 mEq/L (ref 135–145)

## 2011-03-27 LAB — LIPASE: Lipase: 38 U/L (ref 11.0–59.0)

## 2011-03-27 LAB — HEPATIC FUNCTION PANEL
AST: 22 U/L (ref 0–37)
Albumin: 4.2 g/dL (ref 3.5–5.2)
Alkaline Phosphatase: 61 U/L (ref 39–117)
Bilirubin, Direct: 0.1 mg/dL (ref 0.0–0.3)
Total Bilirubin: 0.7 mg/dL (ref 0.3–1.2)

## 2011-03-27 LAB — TSH: TSH: 2.09 u[IU]/mL (ref 0.35–5.50)

## 2011-03-27 NOTE — Progress Notes (Signed)
History of Present Illness: This is a 66 year old female who noted the onset of epigastric right lower chest and right upper quadrant pain began on New years day after eating collard greens and and black eye peas. She has had fairly mild constant pain since then that frequently worsens following meals. The past 2 days her symptoms have been more active. Her husband is currently hospitalized recovering from knee replacement surgery. She states this does not feel like her to go reflux symptoms which are well controlled on pantoprazole. She underwent a CT scans of the abdomen and pelvis in August 2011 following an appendectomy which showed resolving postoperative changes. Denies weight loss, constipation, diarrhea, change in stool caliber, melena, hematochezia, nausea, vomiting, dysphagia, reflux symptoms.  Current Medications, Allergies, Past Medical History, Past Surgical History, Family History and Social History were reviewed in [employer] record.  Physical Exam: General: Well developed , well nourished, no acute distress Head: Normocephalic and atraumatic Eyes:  sclerae anicteric, EOMI Ears: Normal auditory acuity Mouth: No deformity or lesions Lungs: Clear throughout to auscultation Heart: Regular rate and rhythm; no murmurs, rubs or bruits Abdomen: Soft, non tender and non distended. No masses, hepatosplenomegaly or hernias noted. Normal Bowel sounds Musculoskeletal: Symmetrical with no gross deformities  Pulses:  Normal pulses noted Extremities: No clubbing, cyanosis, edema or deformities noted Neurological: Alert oriented x 4, grossly nonfocal Psychological:  Alert and cooperative. Normal mood and affect  Assessment and Recommendations:  1. Epigastric, right lower chest pain, right upper quadrant pain. Rule out biliary disease. Obtain blood work and schedule abdominal ultrasound. She is advised to use Tylenol or ibuprofen for pain control.  2. GERD with a history  of erosive esophagitis. Continue pantoprazole 40 mg twice a day and standard antireflux measures.

## 2011-03-27 NOTE — Patient Instructions (Addendum)
Go directly to the basement to have your labs drawn today. You have been scheduled for a Abdominal Ultrasound at [facility] ( [address] Location) on 03/28/11 at 10:30am. Arrive 15 minutes early for registration. Nothing to eat or drink after midnight. If you need to call to reschedule or cancel your appointment, please call [facility] at [phone]. cc: Roxy Manns, MD

## 2011-03-28 ENCOUNTER — Ambulatory Visit
Admission: RE | Admit: 2011-03-28 | Discharge: 2011-03-28 | Disposition: A | Discharge status: Home / Self Care | Payer: Medicare Other | Source: Ambulatory Visit | Attending: Gastroenterology | Admitting: Gastroenterology

## 2011-03-28 DIAGNOSIS — R079 Chest pain, unspecified: Secondary | ICD-10-CM

## 2011-03-28 DIAGNOSIS — R1011 Right upper quadrant pain: Secondary | ICD-10-CM

## 2011-04-03 ENCOUNTER — Ambulatory Visit (INDEPENDENT_AMBULATORY_CARE_PROVIDER_SITE_OTHER): Payer: Medicare Other | Admitting: Internal Medicine

## 2011-04-03 ENCOUNTER — Encounter: Payer: Self-pay | Admitting: Internal Medicine

## 2011-04-03 VITALS — BP 132/74 | HR 65 | Ht 62.0 in | Wt 140.0 lb

## 2011-04-03 DIAGNOSIS — E7800 Pure hypercholesterolemia, unspecified: Secondary | ICD-10-CM

## 2011-04-03 DIAGNOSIS — I428 Other cardiomyopathies: Secondary | ICD-10-CM

## 2011-04-03 DIAGNOSIS — I509 Heart failure, unspecified: Secondary | ICD-10-CM

## 2011-04-03 DIAGNOSIS — E782 Mixed hyperlipidemia: Secondary | ICD-10-CM

## 2011-04-03 DIAGNOSIS — E785 Hyperlipidemia, unspecified: Secondary | ICD-10-CM

## 2011-04-03 LAB — LIPID PANEL
Cholesterol: 180 mg/dL (ref 0–200)
Triglycerides: 224 mg/dL — ABNORMAL HIGH (ref 0.0–149.0)

## 2011-04-03 MED ORDER — ENALAPRIL MALEATE 10 MG PO TABS: 10.0000 mg | ORAL_TABLET | Freq: Every day | ORAL | Status: DC

## 2011-04-03 MED ORDER — CARVEDILOL 12.5 MG PO TABS: 12.5000 mg | ORAL_TABLET | Freq: Two times a day (BID) | ORAL | Status: DC

## 2011-04-03 MED ORDER — SIMVASTATIN 20 MG PO TABS: 20.0000 mg | ORAL_TABLET | Freq: Every day | ORAL | Status: DC

## 2011-04-03 NOTE — Progress Notes (Signed)
HPI  Patient is a 26 yer old with a history of NICM. EF at one point was 20%.  Last echo in 2011 LVEF was 50 to 55%.   Normal coronary arteries on cath in the past.  Also History of LBBB, GERD. On New Years day she had an episode of epigastric pain. She is being evaluated by Claudette Head in GI for this.  Work up so far is negative.  May undrgo endoscopy. HEr breathing is OK  No CP.  She feels occasional isolated skips.  No dizziness  Allergies  Allergen Reactions  . Azithromycin   . Cefuroxime Axetil   . Lansoprazole     REACTION: headaches  . Latex   . Penicillins   . Sulfa Antibiotics Hives  . Cefuroxime Axetil Rash    Current Outpatient Prescriptions  Medication Sig Dispense Refill  . Calcium Carbonate-Vitamin D (CALCIUM 600 + D PO) Take 1 tablet by mouth 2 (two) times daily.      . carvedilol (COREG) 12.5 MG tablet Take 1 tablet (12.5 mg total) by mouth 2 (two) times daily with a meal.  180 tablet  3  . Cholecalciferol (VITAMIN D-3 PO) Take by mouth. As directed      . enalapril (VASOTEC) 10 MG tablet Take 1 tablet (10 mg total) by mouth daily.  90 tablet  3  . Multiple Vitamin (MULTIVITAMIN) capsule Take 1 capsule by mouth daily.        . pantoprazole (PROTONIX) 40 MG tablet Take 1 tablet (40 mg total) by mouth 2 (two) times daily.  180 tablet  3  . simvastatin (ZOCOR) 20 MG tablet Take 1 tablet (20 mg total) by mouth at bedtime.  90 tablet  3    Past Medical History  Diagnosis Date  . Migraine   . Cardiomyopathy     nonischemic  . GERD (gastroesophageal reflux disease)   . Erosive esophagitis     LA Classification Grade B  . Hyperlipidemia   . Osteopenia   . Hiatal hernia   . Diabetes mellitus   . Melanoma     basil cell    Past Surgical History  Procedure Date  . Tubal ligation   . Vaginal hysterectomy   . Appendectomy     Family History  Problem Relation Age of Onset  . Heart disease Father   . Alzheimer's disease Mother     History   Social  History  . Marital Status: Married    Spouse Name: N/A    Number of Children: 3  . Years of Education: N/A   Occupational History  . Housewife    Social History Main Topics  . Smoking status: Former Games developer  . Smokeless tobacco: Never Used  . Alcohol Use: 0.0 oz/week  . Drug Use: No  . Sexually Active: Not on file   Other Topics Concern  . Not on file   Social History Narrative  . No narrative on file    Review of Systems:  All systems reviewed.  They are negative to the above problem except as previously stated.  Vital Signs: BP 132/74  Pulse 65  Ht 5\' 2"  (1.575 m)  Wt 140 lb (63.504 kg)  BMI 25.61 kg/m2  Physical Exam Patient is in NAD  HEENT:  Normocephalic, atraumatic. EOMI, PERRLA.  Neck: JVP is normal. No thyromegaly. No bruits.  Lungs: clear to auscultation. No rales no wheezes.  Heart: Regular rate and rhythm. Normal S1, S2. No S3.   No significant  murmurs. PMI not displaced.  Abdomen:  Supple, nontender. Normal bowel sounds. No masses. No hepatomegaly.  Extremities:   Good distal pulses throughout. No lower extremity edema.  Musculoskeletal :moving all extremities.  Neuro:   alert and oriented x3.  CN II-XII grossly intact.   Assessment and Plan:

## 2011-04-03 NOTE — Patient Instructions (Signed)
Lab work today We will call you with results.  Your physician wants you to follow-up in: December 2013 You will receive a reminder letter in the mail two months in advance. If you don't receive a letter, please call our office to schedule the follow-up appointment.  

## 2011-04-04 DIAGNOSIS — E785 Hyperlipidemia, unspecified: Secondary | ICD-10-CM | POA: Insufficient documentation

## 2011-04-04 LAB — LDL CHOLESTEROL, DIRECT: Direct LDL: 89.3 mg/dL

## 2011-04-04 NOTE — Assessment & Plan Note (Signed)
Patient doing well.  Last echo LVEF was 50 to 55%. I would keep on same regimen.  Encouraged her to stay active.

## 2011-04-04 NOTE — Assessment & Plan Note (Signed)
ON statin.  Will get lipids.

## 2011-05-22 ENCOUNTER — Telehealth: Payer: Self-pay | Admitting: Internal Medicine

## 2011-05-22 NOTE — Telephone Encounter (Signed)
Please return call to patient on mobile# [phone]  Patient c/o palpitations, blurred vision, Some dizziness, no SOB. Please return call to patient on mobile#[phone] as she is concerned

## 2011-05-22 NOTE — Telephone Encounter (Signed)
Spoke with pt who reports she had episode of blurry vision this AM while reading newspaper.  Noted dizziness which she describes as "feeling like my head was spinning."  Noted her heart racing also.  Entire episode lasted 2 minutes. No numbness, tingling or weakness. No problems with speech. Returned to normal after about 2 minutes. No symptoms at present time.  I asked pt to contact her primary care MD to have symptoms evaluated but she has not see her in a few years.  I told pt I would route to Dr. Tenny Craw for her recommendations but if she felt like she needed to be evaluated today she should go to urgent care.

## 2011-05-23 ENCOUNTER — Ambulatory Visit (INDEPENDENT_AMBULATORY_CARE_PROVIDER_SITE_OTHER): Payer: Medicare Other | Admitting: *Deleted

## 2011-05-23 VITALS — BP 130/70 | HR 68 | Resp 18 | Wt 141.0 lb

## 2011-05-23 DIAGNOSIS — R002 Palpitations: Secondary | ICD-10-CM

## 2011-05-23 NOTE — Patient Instructions (Signed)
Patient had an episode of blurry vision and heart racing yesterday. Today without complaints. EkG completed and  Showed NSR rate of 68 with LBBB that is not new. Patient denies any recent history of GI bug. She has had a recent head cold but denies taking any decongestants. I advised her that if she has another episode that she needs to call us so we can obtain an event monitor and that if it lasts longer than 2 minutes she is to report to the closest ER to have rhythm documented. Patient verbalized understanding of above.

## 2011-05-23 NOTE — Telephone Encounter (Signed)
Patient came by today for EKG and BP check (see nurses notes)

## 2011-05-23 NOTE — Telephone Encounter (Signed)
Patient feeling better today.  Still very concerned. Not like her isolated PVCs Worried about traveling out of town Sunday.  Question if safe. Wanted to be seen yesterday or today.  Disappointed not able to ' Recom:  COme in for EKG and BP check. K  And TSH were normal in Jan

## 2011-06-11 ENCOUNTER — Other Ambulatory Visit: Payer: Self-pay | Admitting: Gastroenterology

## 2011-06-11 MED ORDER — PANTOPRAZOLE SODIUM 40 MG PO TBEC: 40.0000 mg | DELAYED_RELEASE_TABLET | Freq: Two times a day (BID) | ORAL | Status: DC

## 2011-06-11 NOTE — Telephone Encounter (Signed)
Prescription has been sent to Mail order pharmacy.

## 2011-07-01 ENCOUNTER — Other Ambulatory Visit: Payer: Self-pay | Admitting: Obstetrics and Gynecology

## 2011-07-09 ENCOUNTER — Encounter: Payer: Self-pay | Admitting: Gastroenterology

## 2011-07-14 ENCOUNTER — Encounter: Payer: Self-pay | Admitting: Gastroenterology

## 2011-07-17 LAB — HM DEXA SCAN

## 2011-07-21 ENCOUNTER — Encounter: Payer: Self-pay | Admitting: Family Medicine

## 2011-07-22 ENCOUNTER — Encounter: Payer: Self-pay | Admitting: Family Medicine

## 2011-07-22 ENCOUNTER — Encounter: Payer: Self-pay | Admitting: *Deleted

## 2011-07-28 ENCOUNTER — Encounter: Payer: Self-pay | Admitting: Family Medicine

## 2011-07-28 ENCOUNTER — Encounter: Payer: Self-pay | Admitting: *Deleted

## 2011-07-28 DIAGNOSIS — M8580 Other specified disorders of bone density and structure, unspecified site: Secondary | ICD-10-CM | POA: Insufficient documentation

## 2011-09-12 ENCOUNTER — Ambulatory Visit (AMBULATORY_SURGERY_CENTER): Payer: Medicare Other | Admitting: *Deleted

## 2011-09-12 VITALS — Ht 62.0 in | Wt 139.0 lb

## 2011-09-12 DIAGNOSIS — Z1211 Encounter for screening for malignant neoplasm of colon: Secondary | ICD-10-CM

## 2011-09-12 MED ORDER — MOVIPREP 100 G PO SOLR: ORAL | Status: DC

## 2011-09-12 NOTE — Progress Notes (Signed)
Spoke with pharmacist about giving patient moviprep,as she allergic to Ceftin. He looked it up and said he did NOT see any contraindications, so he states I can go a head and order the moviprep.

## 2011-09-15 ENCOUNTER — Encounter: Payer: Self-pay | Admitting: Gastroenterology

## 2011-09-25 ENCOUNTER — Encounter: Payer: Medicare Other | Admitting: Gastroenterology

## 2011-10-09 DIAGNOSIS — K635 Polyp of colon: Secondary | ICD-10-CM

## 2011-10-09 HISTORY — DX: Polyp of colon: K63.5

## 2011-10-10 ENCOUNTER — Encounter: Payer: Self-pay | Admitting: Gastroenterology

## 2011-10-10 ENCOUNTER — Ambulatory Visit (AMBULATORY_SURGERY_CENTER): Payer: Medicare Other | Admitting: Gastroenterology

## 2011-10-10 VITALS — BP 141/65 | HR 71 | Temp 98.3°F | Resp 20 | Ht 62.0 in | Wt 139.0 lb

## 2011-10-10 DIAGNOSIS — D126 Benign neoplasm of colon, unspecified: Secondary | ICD-10-CM

## 2011-10-10 DIAGNOSIS — Z1211 Encounter for screening for malignant neoplasm of colon: Secondary | ICD-10-CM

## 2011-10-10 MED ORDER — SODIUM CHLORIDE 0.9 % IV SOLN: 500.0000 mL | INTRAVENOUS | Status: DC

## 2011-10-10 NOTE — Op Note (Signed)
[facility] [address] [place], [place]  [address]  COLONOSCOPY PROCEDURE REPORT  PATIENT:  [name], [name]  MR#:  [id number] BIRTHDATE:  1946-01-14, 66 yrs. old  GENDER:  female ENDOSCOPIST:  Judie Petit T. Russella Dar, MD, [facility]  PROCEDURE DATE:  10/10/2011 PROCEDURE:  Colonoscopy with snare polypectomy ASA CLASS:  Class II INDICATIONS:  1) Routine Risk Screening MEDICATIONS:   MAC sedation, administered by CRNA, propofol (Diprivan) 170 mg IV DESCRIPTION OF PROCEDURE:  After the risks benefits and alternatives of the procedure were thoroughly explained, informed consent was obtained.  Digital rectal exam was performed and revealed no abnormalities.  The LB CF-[id number] endoscope was introduced through the anus and advanced to the cecum, which was identified by both the appendix and ileocecal valve, without limitations. The quality of the prep was good, using MoviPrep. The instrument was then slowly withdrawn as the colon was fully examined. <<PROCEDUREIMAGES>> FINDINGS:  A sessile polyp was found in the ascending colon. It was 7 mm in size. Polyp was snared without cautery. Retrieval was successful.  Otherwise normal colonoscopy without other polyps, masses, vascular ectasias, or inflammatory changes.  Retroflexed views in the rectum revealed no abnormalities. The time to cecum = 3.25 minutes. The scope was then withdrawn (time =  11.5 min) from the patient and the procedure completed.  COMPLICATIONS:  None  ENDOSCOPIC IMPRESSION: 1) 7 mm sessile polyp in the ascending colon  RECOMMENDATIONS: 1) Repeat Colonoscopy in 5 years  Malcolm T. Russella Dar, MD, [facility]  n. eSIGNED:   Venita Lick. Stark at 10/10/2011 09:51 AM  Elbert Ewings, [id number]

## 2011-10-10 NOTE — Patient Instructions (Signed)
Colon polyp removed today.  Handout given. Repeat colonoscopy in 5 years. Thank you!   YOU HAD AN ENDOSCOPIC PROCEDURE TODAY AT THE [facility]: Refer to the procedure report that was given to you for any specific questions about what was found during the examination.  If the procedure report does not answer your questions, please call your gastroenterologist to clarify.  If you requested that your care partner not be given the details of your procedure findings, then the procedure report has been included in a sealed envelope for you to review at your convenience later.  YOU SHOULD EXPECT: Some feelings of bloating in the abdomen. Passage of more gas than usual.  Walking can help get rid of the air that was put into your GI tract during the procedure and reduce the bloating. If you had a lower endoscopy (such as a colonoscopy or flexible sigmoidoscopy) you may notice spotting of blood in your stool or on the toilet paper. If you underwent a bowel prep for your procedure, then you may not have a normal bowel movement for a few days.  DIET: Your first meal following the procedure should be a light meal and then it is ok to progress to your normal diet.  A half-sandwich or bowl of soup is an example of a good first meal.  Heavy or fried foods are harder to digest and may make you feel nauseous or bloated.  Likewise meals heavy in dairy and vegetables can cause extra gas to form and this can also increase the bloating.  Drink plenty of fluids but you should avoid alcoholic beverages for 24 hours.  ACTIVITY: Your care partner should take you home directly after the procedure.  You should plan to take it easy, moving slowly for the rest of the day.  You can resume normal activity the day after the procedure however you should NOT DRIVE or use heavy machinery for 24 hours (because of the sedation medicines used during the test).    SYMPTOMS TO REPORT IMMEDIATELY: A gastroenterologist can be  reached at any hour.  During normal business hours, 8:30 AM to 5:00 PM Monday through Friday, call [phone].  After hours and on weekends, please call the GI answering service at [phone] who will take a message and have the physician on call contact you.   Following lower endoscopy (colonoscopy or flexible sigmoidoscopy):  Excessive amounts of blood in the stool  Significant tenderness or worsening of abdominal pains  Swelling of the abdomen that is new, acute  Fever of 100F or higher  FOLLOW UP: If any biopsies were taken you will be contacted by phone or by letter within the next 1-3 weeks.  Call your gastroenterologist if you have not heard about the biopsies in 3 weeks.  Our staff will call the home number listed on your records the next business day following your procedure to check on you and address any questions or concerns that you may have at that time regarding the information given to you following your procedure. This is a courtesy call and so if there is no answer at the home number and we have not heard from you through the emergency physician on call, we will assume that you have returned to your regular daily activities without incident.  SIGNATURES/CONFIDENTIALITY: You and/or your care partner have signed paperwork which will be entered into your electronic medical record.  These signatures attest to the fact that that the information above on your After  Visit Summary has been reviewed and is understood.  Full responsibility of the confidentiality of this discharge information lies with you and/or your care-partner.

## 2011-10-10 NOTE — Progress Notes (Signed)
Patient did not experience any of the following events: a burn prior to discharge; a fall within the facility; wrong site/side/patient/procedure/implant event; or a hospital transfer or hospital admission upon discharge from the facility. (G8907) Patient did not have preoperative order for IV antibiotic SSI prophylaxis. (G8918)  

## 2011-10-13 ENCOUNTER — Telehealth: Payer: Self-pay

## 2011-10-13 NOTE — Telephone Encounter (Signed)
  Follow up Call-  Call back number 10/10/2011  Post procedure Call Back phone  # [phone]  Permission to leave phone message Yes     Patient questions:  Do you have a fever, pain , or abdominal swelling? no Pain Score  0 *  Have you tolerated food without any problems? yes  Have you been able to return to your normal activities? yes  Do you have any questions about your discharge instructions: Diet   no Medications  no Follow up visit  no  Do you have questions or concerns about your Care? no  Actions: * If pain score is 4 or above: No action needed, pain <4.

## 2011-10-14 ENCOUNTER — Encounter: Payer: Self-pay | Admitting: Gastroenterology

## 2012-02-09 ENCOUNTER — Telehealth: Payer: Self-pay | Admitting: Gastroenterology

## 2012-02-09 ENCOUNTER — Ambulatory Visit (INDEPENDENT_AMBULATORY_CARE_PROVIDER_SITE_OTHER): Payer: Medicare Other | Admitting: Internal Medicine

## 2012-02-09 ENCOUNTER — Encounter: Payer: Self-pay | Admitting: Internal Medicine

## 2012-02-09 VITALS — BP 114/70 | HR 78 | Ht 62.0 in | Wt 140.0 lb

## 2012-02-09 DIAGNOSIS — I509 Heart failure, unspecified: Secondary | ICD-10-CM

## 2012-02-09 DIAGNOSIS — E7800 Pure hypercholesterolemia, unspecified: Secondary | ICD-10-CM

## 2012-02-09 DIAGNOSIS — R079 Chest pain, unspecified: Secondary | ICD-10-CM

## 2012-02-09 LAB — CBC WITH DIFFERENTIAL/PLATELET
Basophils Absolute: 0 10*3/uL (ref 0.0–0.1)
Eosinophils Absolute: 0.5 10*3/uL (ref 0.0–0.7)
HCT: 38.3 % (ref 36.0–46.0)
Hemoglobin: 13.1 g/dL (ref 12.0–15.0)
Lymphs Abs: 1.6 10*3/uL (ref 0.7–4.0)
MCHC: 34.3 g/dL (ref 30.0–36.0)
Monocytes Absolute: 0.6 10*3/uL (ref 0.1–1.0)
Neutro Abs: 2.9 10*3/uL (ref 1.4–7.7)
Platelets: 259 10*3/uL (ref 150.0–400.0)
RDW: 12.6 % (ref 11.5–14.6)

## 2012-02-09 LAB — BASIC METABOLIC PANEL
Calcium: 9.6 mg/dL (ref 8.4–10.5)
Creatinine, Ser: 0.6 mg/dL (ref 0.4–1.2)
GFR: 101.89 mL/min (ref >60.00)

## 2012-02-09 MED ORDER — NITROGLYCERIN 0.4 MG SL SUBL: 0.4000 mg | SUBLINGUAL_TABLET | SUBLINGUAL | Status: DC | PRN

## 2012-02-09 MED ORDER — CARVEDILOL 12.5 MG PO TABS: 12.5000 mg | ORAL_TABLET | Freq: Two times a day (BID) | ORAL | Status: DC

## 2012-02-09 MED ORDER — SIMVASTATIN 20 MG PO TABS: 20.0000 mg | ORAL_TABLET | Freq: Every day | ORAL | Status: DC

## 2012-02-09 MED ORDER — ENALAPRIL MALEATE 10 MG PO TABS: 10.0000 mg | ORAL_TABLET | Freq: Every day | ORAL | Status: DC

## 2012-02-09 NOTE — Patient Instructions (Addendum)
Your physician wants you to follow-up in: 1 yearYou will receive a reminder letter in the mail two months in advance. If you don't receive a letter, please call our office to schedule the follow-up appointment.   Lab work today We will call you with results  Having fasting lab work at PCP.

## 2012-02-09 NOTE — Progress Notes (Signed)
[name] is a 37 yer old with a history of NICM. EF at one point was 20%. Last echo in 2011 LVEF was 50 to 55%. Normal coronary arteries on cath in the past. Also History of LBBB, GERD.  On New Years day she had an episode of epigastric pain. She is being evaluated by Claudette Head in GI for this. Work up so far is negative. May undrgo endoscopy.  HEr breathing is OK No CP. She feels  Last lipid panel in Jan LDL was 89, HDL was 58  Trigs were 224  She remains very active. Last night had an episode of chest discomfort  She thought it was reflux.  Unreleaved with PPI, did not have ntg or maalox.  Finally eased at about 11.  Today still not normal Denies SOB  No CP prior.  No change in her ability to do things. Allergies  Allergen Reactions  . Penicillins Hives  . Azithromycin Other (See Comments)    Pt denies-states she does take a z pack  . Lansoprazole     REACTION: headaches  . Sulfa Antibiotics Hives  . Ceclor (Cefaclor) Itching and Rash  . Ceftin (Cefuroxime Axetil) Rash  . Clindamycin/Lincomycin Itching and Rash  . Latex Itching and Rash  . Septra (Sulfamethoxazole W-Trimethoprim) Rash    Current Outpatient Prescriptions  Medication Sig Dispense Refill  . Calcium Carbonate-Vitamin D (CALCIUM 600 + D PO) Take 1 tablet by mouth 2 (two) times daily.      . carvedilol (COREG) 12.5 MG tablet Take 1 tablet (12.5 mg total) by mouth 2 (two) times daily with a meal.  180 tablet  3  . Cholecalciferol (VITAMIN D-3 PO) Take by mouth. As directed      . clobetasol ointment (TEMOVATE) 0.05 % Apply 1 application topically as needed.      . enalapril (VASOTEC) 10 MG tablet Take 1 tablet (10 mg total) by mouth daily.  90 tablet  3  . fish oil-omega-3 fatty acids 1000 MG capsule Take 2 g by mouth daily.      . hydrOXYzine (ATARAX/VISTARIL) 10 MG tablet Take 1 tablet by mouth as needed.      . Multiple Vitamin (MULTIVITAMIN) capsule Take 1 capsule by mouth daily.        . pantoprazole (PROTONIX)  40 MG tablet Take 1 tablet (40 mg total) by mouth 2 (two) times daily.  180 tablet  3  . promethazine (PHENERGAN) 25 MG tablet Take 1 tablet by mouth as needed.      . simvastatin (ZOCOR) 20 MG tablet Take 1 tablet (20 mg total) by mouth at bedtime.  90 tablet  3    Past Medical History  Diagnosis Date  . Migraine   . Cardiomyopathy     nonischemic  . GERD (gastroesophageal reflux disease)   . Erosive esophagitis     LA Classification Grade B  . Hyperlipidemia   . Osteopenia   . Hiatal hernia   . Melanoma     basil cell  . Diabetes mellitus     pt states she is not diabetic    Past Surgical History  Procedure Date  . Tubal ligation   . Vaginal hysterectomy   . Appendectomy     Family History  Problem Relation Age of Onset  . Heart disease Father   . Alzheimer's disease Mother   . Colon cancer Neg Hx   . Stomach cancer Neg Hx   . Rectal cancer Neg Hx   .  Esophageal cancer Neg Hx     History   Social History  . Marital Status: Married    Spouse Name: N/A    Number of Children: 3  . Years of Education: N/A   Occupational History  . Housewife    Social History Main Topics  . Smoking status: Former Games developer  . Smokeless tobacco: Never Used  . Alcohol Use: 1.8 oz/week    3 Glasses of wine per week  . Drug Use: No  . Sexually Active: Not on file   Other Topics Concern  . Not on file   Social History Narrative  . No narrative on file    Review of Systems:  All systems reviewed.  They are negative to the above problem except as previously stated.  Vital Signs: BP 114/70  Pulse 78  Ht 5\' 2"  (1.575 m)  Wt 140 lb (63.504 kg)  BMI 25.61 kg/m2  Physical Exam Patient in NAD HEENT:  Normocephalic, atraumatic. EOMI, PERRLA.  Neck: JVP is normal.  No bruits.  Lungs: clear to auscultation. No rales no wheezes.  Heart: Regular rate and rhythm. Normal S1, S2. No S3.   No significant murmurs. PMI not displaced.  Abdomen:  Supple, nontender. Normal bowel sounds.  No masses. No hepatomegaly.  Extremities:   Good distal pulses throughout. No lower extremity edema.  Musculoskeletal :moving all extremities.  Neuro:   alert and oriented x3.  CN II-XII grossly intact.  EKG  SR  78 bpm.  LBBB.  Assessment and Plan:  1.  CM  Normalization of LV function.  Volume looks good   2.  CP  Not clear what cause was   Will check troponin though not convinced it was ischemia.  Continue PPI. Have given Rx for NTG  To have in future  Has appt in GI next wk  3.  HL  Will need fasting lipids.  Will check CBC and BMET today.  F/u in 1 year if blood work ok.

## 2012-02-09 NOTE — Telephone Encounter (Signed)
Patient reports that she had chest pain last night that took over 3 hours to relieve.  She reports that she can usually take TUMS, or drink milk and this relieves this. She reports it was a squeezing like pain that is similar to the reflux symptoms she felt in the past.  She also notes she felt the pain in her jaw.  She has an appt today with Dr. Tenny Craw that was previously scheduled.  I have scheduled her an office visit with Dr. Russella Dar for 02/17/12, she will call me back if we need to move this up and see an extender this week.

## 2012-02-17 ENCOUNTER — Ambulatory Visit (INDEPENDENT_AMBULATORY_CARE_PROVIDER_SITE_OTHER): Payer: Medicare Other | Admitting: Gastroenterology

## 2012-02-17 ENCOUNTER — Encounter: Payer: Self-pay | Admitting: Gastroenterology

## 2012-02-17 VITALS — BP 120/64 | HR 80 | Ht 62.0 in | Wt 141.5 lb

## 2012-02-17 DIAGNOSIS — R0789 Other chest pain: Secondary | ICD-10-CM

## 2012-02-17 DIAGNOSIS — Z86010 Personal history of colonic polyps: Secondary | ICD-10-CM

## 2012-02-17 DIAGNOSIS — R072 Precordial pain: Secondary | ICD-10-CM

## 2012-02-17 DIAGNOSIS — K219 Gastro-esophageal reflux disease without esophagitis: Secondary | ICD-10-CM

## 2012-02-17 NOTE — Progress Notes (Signed)
History of Present Illness: This is a 66 year old female with GERD and erosive esophagitis. Last upper endoscopy performed May 2011. Her reflux symptoms have been under excellent control for the past 2 years on pantoprazole 40 mg twice a day. On Sunday evening she had substernal chest pain radiating to her jaw for 3-1/2 hours. Her symptoms did not respond at all to [facility] or Pepcid [facility] and these medications generally have been very effective for mild flares of GERD past. Eventually her symptoms abated. She was seen by Dr. Huston Foley on Monday and given sublingual nitroglycerin for when necessary usage.   Current Medications, Allergies, Past Medical History, Past Surgical History, Family History and Social History were reviewed in [employer] record.  Physical Exam: General: Well developed , well nourished, no acute distress Head: Normocephalic and atraumatic Eyes:  sclerae anicteric, EOMI Ears: Normal auditory acuity Mouth: No deformity or lesions Lungs: Clear throughout to auscultation, no chest wall tenderness  Heart: Regular rate and rhythm; no murmurs, rubs or bruits Abdomen: Soft, non tender and non distended. No masses, hepatosplenomegaly or hernias noted. Normal Bowel sounds Musculoskeletal: Symmetrical with no gross deformities  Pulses:  Normal pulses noted Extremities: No clubbing, cyanosis, edema or deformities noted Neurological: Alert oriented x 4, grossly nonfocal Psychological:  Alert and cooperative. Normal mood and affect  Assessment and Recommendations:  1. Chest pain radiating to the jaw. Her symptoms were not typical for GERD and did not respond to Pepcid [facility] and TUMS. If her symptoms recur I've advised her to contact Dr. Tenny Craw for further evaluation.  2. GERD. Continue pantoprazole 40 mg twice daily. May use Pepcid AC, TUMS and/or Maalox for breakthrough symptoms. If her chest pain continues and cardiac causes are felt to be definitively excluded consider  further GI evaluation.   3. Personal history of adenomatous colon polyps, initial diagnosis August 2013. Surveillance colonoscopy August 2018.

## 2012-02-17 NOTE — Patient Instructions (Addendum)
Take Maalox as needed for chest pain.   Please call us back if your symptoms return.  cc: Dietrich Pates, MD

## 2012-02-19 ENCOUNTER — Encounter: Payer: Self-pay | Admitting: Internal Medicine

## 2012-02-19 ENCOUNTER — Other Ambulatory Visit: Payer: Self-pay | Admitting: Internal Medicine

## 2012-02-19 ENCOUNTER — Other Ambulatory Visit: Payer: Self-pay

## 2012-02-19 ENCOUNTER — Ambulatory Visit: Payer: Medicare Other

## 2012-02-19 DIAGNOSIS — R0602 Shortness of breath: Secondary | ICD-10-CM

## 2012-02-19 DIAGNOSIS — R079 Chest pain, unspecified: Secondary | ICD-10-CM

## 2012-02-19 LAB — AMYLASE: Amylase: 65 U/L (ref 27–131)

## 2012-02-19 LAB — CBC WITH DIFFERENTIAL/PLATELET
Basophils Relative: 0.4 % (ref 0.0–3.0)
Hemoglobin: 12.6 g/dL (ref 12.0–15.0)
Lymphocytes Relative: 24.2 % (ref 12.0–46.0)
Monocytes Relative: 8.9 % (ref 3.0–12.0)
Neutro Abs: 3 10*3/uL (ref 1.4–7.7)
Neutrophils Relative %: 61 % (ref 43.0–77.0)
RBC: 4.31 Mil/uL (ref 3.87–5.11)
WBC: 5 10*3/uL (ref 4.5–10.5)

## 2012-02-19 LAB — LIPASE: Lipase: 34 U/L (ref 11.0–59.0)

## 2012-02-19 LAB — COMPREHENSIVE METABOLIC PANEL
ALT: 23 U/L (ref 0–35)
Albumin: 3.9 g/dL (ref 3.5–5.2)
BUN: 16 mg/dL (ref 6–23)
CO2: 27 mEq/L (ref 19–32)
Calcium: 9.4 mg/dL (ref 8.4–10.5)
Chloride: 107 mEq/L (ref 96–112)
Creatinine, Ser: 0.6 mg/dL (ref 0.4–1.2)
GFR: 112.26 mL/min (ref >60.00)
Potassium: 3.7 mEq/L (ref 3.5–5.1)

## 2012-02-20 ENCOUNTER — Telehealth: Payer: Self-pay | Admitting: *Deleted

## 2012-02-20 DIAGNOSIS — R079 Chest pain, unspecified: Secondary | ICD-10-CM

## 2012-02-20 NOTE — Telephone Encounter (Signed)
Dr.Ross called patient and advised her to complete a cardiac CT scan because of the chest pain she has been experiencing. Will send order to [facility] and they will call her back.

## 2012-02-24 ENCOUNTER — Encounter: Payer: Self-pay | Admitting: Internal Medicine

## 2012-02-27 ENCOUNTER — Other Ambulatory Visit: Payer: Self-pay | Admitting: *Deleted

## 2012-02-27 ENCOUNTER — Telehealth: Payer: Self-pay | Admitting: *Deleted

## 2012-02-27 NOTE — Telephone Encounter (Signed)
Ms. Provencio Cardiac CT is on hold for now per Charmaine. Insurance did not approve study.

## 2012-03-11 ENCOUNTER — Other Ambulatory Visit: Payer: Self-pay | Admitting: *Deleted

## 2012-03-11 ENCOUNTER — Telehealth: Payer: Self-pay

## 2012-03-11 ENCOUNTER — Encounter (HOSPITAL_COMMUNITY): Payer: Self-pay

## 2012-03-11 ENCOUNTER — Ambulatory Visit: Payer: Medicare Other | Admitting: *Deleted

## 2012-03-11 ENCOUNTER — Ambulatory Visit (HOSPITAL_COMMUNITY): Payer: Medicare Other | Attending: Cardiology | Admitting: Radiology

## 2012-03-11 VITALS — Ht 62.0 in | Wt 140.0 lb

## 2012-03-11 DIAGNOSIS — R079 Chest pain, unspecified: Secondary | ICD-10-CM

## 2012-03-11 DIAGNOSIS — R42 Dizziness and giddiness: Secondary | ICD-10-CM | POA: Insufficient documentation

## 2012-03-11 DIAGNOSIS — R5383 Other fatigue: Secondary | ICD-10-CM | POA: Insufficient documentation

## 2012-03-11 DIAGNOSIS — R0602 Shortness of breath: Secondary | ICD-10-CM

## 2012-03-11 DIAGNOSIS — R0989 Other specified symptoms and signs involving the circulatory and respiratory systems: Secondary | ICD-10-CM | POA: Insufficient documentation

## 2012-03-11 DIAGNOSIS — R0609 Other forms of dyspnea: Secondary | ICD-10-CM | POA: Insufficient documentation

## 2012-03-11 DIAGNOSIS — R5381 Other malaise: Secondary | ICD-10-CM | POA: Insufficient documentation

## 2012-03-11 DIAGNOSIS — R002 Palpitations: Secondary | ICD-10-CM | POA: Insufficient documentation

## 2012-03-11 LAB — CBC WITH DIFFERENTIAL/PLATELET
Eosinophils Relative: 5 % (ref 0–5)
Lymphocytes Relative: 30 % (ref 12–46)
Lymphs Abs: 1.4 10*3/uL (ref 0.7–4.0)
MCV: 88.2 fL (ref 78.0–100.0)
Neutro Abs: 2.7 10*3/uL (ref 1.7–7.7)
Neutrophils Relative %: 56 % (ref 43–77)
Platelets: 240 10*3/uL (ref 150–400)
RBC: 4.39 MIL/uL (ref 3.87–5.11)
WBC: 4.8 10*3/uL (ref 4.0–10.5)

## 2012-03-11 LAB — CK TOTAL AND CKMB (NOT AT ARMC): Total CK: 58 U/L (ref 7–177)

## 2012-03-11 MED ORDER — TECHNETIUM TC 99M SESTAMIBI GENERIC - CARDIOLITE
11.0000 | Freq: Once | INTRAVENOUS | Status: AC | PRN
  Administered 2012-03-11: 11 via INTRAVENOUS

## 2012-03-11 MED ORDER — ADENOSINE (DIAGNOSTIC) 3 MG/ML IV SOLN
0.5600 mg/kg | Freq: Once | INTRAVENOUS | Status: AC
  Administered 2012-03-11: 35.7 mg via INTRAVENOUS

## 2012-03-11 MED ORDER — ADENOSINE (DIAGNOSTIC) 3 MG/ML IV SOLN: 0.8400 mg/kg | Freq: Once | INTRAVENOUS | Status: DC

## 2012-03-11 MED ORDER — AMLODIPINE BESYLATE 2.5 MG PO TABS: 2.5000 mg | ORAL_TABLET | Freq: Every day | ORAL | Status: DC

## 2012-03-11 MED ORDER — TECHNETIUM TC 99M SESTAMIBI GENERIC - CARDIOLITE
33.0000 | Freq: Once | INTRAVENOUS | Status: AC | PRN
  Administered 2012-03-11: 33 via INTRAVENOUS

## 2012-03-11 NOTE — Telephone Encounter (Signed)
Dr. Tenny Craw called and feels that her chest pain is not cardiac in nature she would like you to call her tomorrow to discuss.  Her cell is [phone].  She is also sending you a copy of her note.

## 2012-03-11 NOTE — Progress Notes (Signed)
Patient ID: [name], female   DOB: [birth year], 67 y.o.   MRN: [id number] Patient had an Adenosine cardiolite done today. The patient continues to have chest tightness 1/10 after images. Dr. Marca Ancona reviewed the images, and discussed with the patient.  Dr. Shirlee Latch called Dr. Dietrich Pates (Patients' cardiologist). Dr. Tenny Craw spoke to the patient by phone. Dr. Dietrich Pates ordered CBC with Diff, CK total,Troponin stat labs, and added Norvasc 2.5mg  daily,and decreased Vasotec to 5mg  daily per Carlyon Shadow, RN. The patient told to go to ED if symptoms persist. Irean Hong, RN.

## 2012-03-11 NOTE — Progress Notes (Signed)
[facility] 3 NUCLEAR MED [address] [place], [place] [address] [phone]    Cardiology Nuclear Med Study  [name] is a 67 y.o. female     MRN : [id number]     DOB: 29-Jul-1945  Procedure Date: 03/11/2012  Nuclear Med Background Indication for Stress Test:  Evaluation for Ischemia History:  NICM, No prior known history of CAD, 06-10-01 OZH:YQMV anterior/apex with ? Superimposed ischemia, EF=32%> 06-15-01 Cath: Normal Coronaries, EF=29%, '11 Echo: EF=50-55% Cardiac Risk Factors: Family History - CAD, History of Smoking, LBBB and Lipids  Symptoms: Chest Pain/Pressure without exertion (last occurrence at present since getting up this morning 1 or 2/10),  DOE, Fatigue, Fatigue with Exertion, Light-Headedness, Palpitations and SOB   Nuclear Pre-Procedure Caffeine/Decaff Intake:  None> 12 hrs NPO After: 6:30pm   Lungs:  clear O2 Sat: 95-97%% on room air. IV 0.9% NS with Angio Cath:  22g  IV Site: R Hand x 1, tolerated well IV Started by:  Bonnita Levan, RN  Chest Size (in):  36 Cup Size: B  Height: 5\' 2"  (1.575 m)  Weight:  140 lb (63.504 kg)  BMI:  Body mass index is 25.61 kg/(m^2). Tech Comments:  The patient continues to have chest tightness 1/10. Images reviewed by Dr. Shirlee Latch who then called Dr. Tenny Craw (patients' cardiologist) who ordered labs, and adjusted medications. Irean Hong, RN    Nuclear Med Study 1 or 2 day study: 1 day  Stress Test Type:  Adenosine  Reading MD: Cassell Clement, MD  Order Authorizing Provider:  Dietrich Pates, MD  Resting Radionuclide: Technetium 45m Sestamibi  Resting Radionuclide Dose: 11.0 mCi   Stress Radionuclide:  Technetium 41m Sestamibi  Stress Radionuclide Dose: 33.0 mCi           Stress Protocol Rest HR: 69 Stress HR: 93  Rest BP: 145/78 Stress BP: 146/73  Exercise Time (min): n/a METS: n/a   Predicted Max HR: 153 bpm % Max HR: 60.78 bpm Rate Pressure Product: 78469    Dose of Adenosine (mg):  35.6 Dose of  Lexiscan: n/a mg  Dose of Atropine (mg): n/a Dose of Dobutamine: n/a mcg/kg/min (at max HR)  Stress Test Technologist: Irean Hong, RN  Nuclear Technologist:  Domenic Polite, CNMT     Rest Procedure:  Myocardial perfusion imaging was performed at rest 45 minutes following the intravenous administration of Technetium 8m Sestamibi. Rest ECG: NSR-LBBB  Stress Procedure:  The patient received IV adenosine at 140 mcg/kg/min for 4 minutes.  The pain had mild chest tightness at baseline that increased to 3 or 4/10 with adenosine. Technetium 66m Sestamibi was injected at the 2 minute mark and quantitative spect images were obtained after a 45 minute delay.  Stress ECG: No significant change from baseline ECG  QPS Raw Data Images:  Normal; no motion artifact; normal heart/lung ratio. Stress Images:  There is decreased uptake in the apex. Rest Images:  There is decreased uptake in the apex. Subtraction (SDS):  These findings are consistent with ischemia.There is a small area of mild ischemia involving the apex. There is partial reversibility. Transient Ischemic Dilatation (Normal <1.22):  0.97 Lung/Heart Ratio (Normal <0.45):  0.24  Quantitative Gated Spect Images QGS EDV:  71 ml QGS ESV:  23 ml  Impression Exercise Capacity:  Adenosine study with no exercise. BP Response:  Normal blood pressure response. Clinical Symptoms:  Significant chest pain. Chest tightness 4/10 after adenosine. ECG Impression:  Baseline:  LBBB.  EKG uninterpretable due to  LBBB at rest and stress. Comparison with Prior Nuclear Study: No images to compare  Overall Impression:  Low risk stress nuclear study. There is a small apical area of mild severity with partial reversibility.  LV Ejection Fraction: 68%.  LV Wall Motion:  There is  apical hypokinesis. The overall ejection fraction is normal and has improved from prior studies.  Cassell Clement

## 2012-03-12 ENCOUNTER — Ambulatory Visit: Payer: Medicare Other | Admitting: Cardiology

## 2012-03-12 ENCOUNTER — Telehealth: Payer: Self-pay | Admitting: *Deleted

## 2012-03-12 NOTE — Telephone Encounter (Signed)
Returned call to Dr. Tenny Craw. Pt had nuclear study with an apical defect-unchanged since last study. Symptoms not responding to acid suppression. Might be non cardiac or non GI pain and need PCP evaluation. Possible further cardiac evaluation pending.

## 2012-03-12 NOTE — Telephone Encounter (Signed)
Called patient per Dr.Ross' request to advise her that Dr.Stark did not feel that she needed an endoscopy at this time.

## 2012-03-17 ENCOUNTER — Telehealth: Payer: Self-pay | Admitting: Gastroenterology

## 2012-03-17 NOTE — Telephone Encounter (Signed)
Patient advised of the recommendation based on the phone call between Dr. Tenny Craw and Dr. Russella Dar.  She will return to her primary care

## 2012-04-12 ENCOUNTER — Telehealth: Payer: Self-pay | Admitting: Internal Medicine

## 2012-04-12 DIAGNOSIS — I509 Heart failure, unspecified: Secondary | ICD-10-CM

## 2012-04-12 DIAGNOSIS — E7800 Pure hypercholesterolemia, unspecified: Secondary | ICD-10-CM

## 2012-04-12 MED ORDER — ENALAPRIL MALEATE 10 MG PO TABS: 10.0000 mg | ORAL_TABLET | Freq: Every day | ORAL | Status: DC

## 2012-04-12 MED ORDER — SIMVASTATIN 20 MG PO TABS: 20.0000 mg | ORAL_TABLET | Freq: Every day | ORAL | Status: DC

## 2012-04-12 MED ORDER — CARVEDILOL 12.5 MG PO TABS: 12.5000 mg | ORAL_TABLET | Freq: Two times a day (BID) | ORAL | Status: DC

## 2012-04-12 NOTE — Telephone Encounter (Signed)
New problem:   Prime mail [phone].   Simvastatin  20 mg Enalapril  10 mg Coreg 12.5 mg

## 2012-04-26 NOTE — Progress Notes (Signed)
Called patient to get follow up  Now on amlodipine 2.5 mg Last episode of chest discomfort was at end of January  Was not as severe as first  Otherwise remains active.  No problems doing things. I am not convinced CP was cardiac in origin.  Question still if GI  Keep on same regimen.

## 2012-05-18 ENCOUNTER — Encounter: Payer: Self-pay | Admitting: Family Medicine

## 2012-05-18 ENCOUNTER — Ambulatory Visit (INDEPENDENT_AMBULATORY_CARE_PROVIDER_SITE_OTHER): Payer: Medicare Other | Admitting: Family Medicine

## 2012-05-18 VITALS — BP 120/80 | HR 64 | Temp 98.1°F | Ht 62.25 in | Wt 143.0 lb

## 2012-05-18 DIAGNOSIS — M8580 Other specified disorders of bone density and structure, unspecified site: Secondary | ICD-10-CM

## 2012-05-18 DIAGNOSIS — E785 Hyperlipidemia, unspecified: Secondary | ICD-10-CM

## 2012-05-18 DIAGNOSIS — I428 Other cardiomyopathies: Secondary | ICD-10-CM

## 2012-05-18 DIAGNOSIS — K208 Other esophagitis without bleeding: Secondary | ICD-10-CM

## 2012-05-18 DIAGNOSIS — E119 Type 2 diabetes mellitus without complications: Secondary | ICD-10-CM

## 2012-05-18 NOTE — Patient Instructions (Addendum)
It's so nice to meet you.  Please touch base with Dr. Russella Dar.  I will send him my note from today.

## 2012-05-18 NOTE — Progress Notes (Signed)
Very pleasant 67 year old female with h/o  GERD, erosive esophagitis, cardiomyopathy, and left bundle branch block here to establish care.  She states that she needs my help today because she has been going back and forth between GI, Dr. Russella Dar, and cardiology, Dr. Tenny Craw, trying to figure out what is going on with her.  She has had several episodes since 02/08/2012 of severe substernal chest pain that radiates to her jaw and neck.  Worst episode was in 02/2012- lasted for 3 or 4 hours and did not respond to her usual reflux medication, protonix 40 mg twice daily and as needed pepcid or tums. Last endoscopy was in 07/2009.  Has been seeing Dr. Russella Dar who most recently in 03/2012 felt she did not need repeat endoscopy.  Dr. Tenny Craw has been seeing her as well to rule out a cardiac cause.  Notes reviewed.  Pt had nuclear study with an apical defect-unchanged since last study, has a h/o LBBB.   She gave her NTG which she has not tried yet as last two episodes did respond to added Pepcid and Tums.  She is here today because she does not know what to do next.  She is having at least one episode per month and she is concerned that she does not know if it is cardiac or GI source.  Patient Active Problem List  Diagnosis  . HYPERCHOLESTEROLEMIA, PURE  . HYPOKALEMIA  . MIGRAINE HEADACHE  . CARDIOMYOPATHY  . EROSIVE ESOPHAGITIS  . GERD  . HIATAL HERNIA  . PERITONITIS  . LEG EDEMA, BILATERAL  . COLONIC POLYPS, HYPERPLASTIC, HX OF  . Dyslipidemia  . Osteopenia   Past Medical History  Diagnosis Date  . Migraine   . Cardiomyopathy     nonischemic  . GERD (gastroesophageal reflux disease)   . Erosive esophagitis     LA Classification Grade B  . Hyperlipidemia   . Osteopenia   . Hiatal hernia   . Melanoma     basil cell  . Diabetes mellitus     pt states she is not diabetic  . Sessile colonic polyp 10/2011    adenoma   Past Surgical History  Procedure Laterality Date  . Tubal ligation    .  Vaginal hysterectomy    . Appendectomy     History  Substance Use Topics  . Smoking status: Former Games developer  . Smokeless tobacco: Never Used  . Alcohol Use: 1.8 oz/week    3 Glasses of wine per week   Family History  Problem Relation Age of Onset  . Heart disease Father   . Alzheimer's disease Mother   . Colon cancer Neg Hx   . Stomach cancer Neg Hx   . Rectal cancer Neg Hx   . Esophageal cancer Neg Hx    Allergies  Allergen Reactions  . Penicillins Hives  . Azithromycin Other (See Comments)    Pt denies-states she does take a z pack  . Lansoprazole     REACTION: headaches  . Sulfa Antibiotics Hives  . Ceclor (Cefaclor) Itching and Rash  . Ceftin (Cefuroxime Axetil) Rash  . Clindamycin/Lincomycin Itching and Rash  . Latex Itching and Rash  . Septra (Sulfamethoxazole W-Trimethoprim) Rash   Current Outpatient Prescriptions on File Prior to Visit  Medication Sig Dispense Refill  . amLODipine (NORVASC) 2.5 MG tablet Take 1 tablet (2.5 mg total) by mouth daily.  30 tablet  6  . Calcium Carbonate-Vitamin D (CALCIUM 600 + D PO) Take 1 tablet by mouth 2 (  two) times daily.      . carvedilol (COREG) 12.5 MG tablet Take 1 tablet (12.5 mg total) by mouth 2 (two) times daily with a meal.  180 tablet  3  . Cholecalciferol (VITAMIN D-3 PO) Take by mouth. As directed      . clobetasol ointment (TEMOVATE) 0.05 % Apply 1 application topically as needed.      . enalapril (VASOTEC) 10 MG tablet Take 1 tablet (10 mg total) by mouth daily.  90 tablet  3  . Multiple Vitamin (MULTIVITAMIN) capsule Take 1 capsule by mouth daily.        . nitroGLYCERIN (NITROSTAT) 0.4 MG SL tablet Place 1 tablet (0.4 mg total) under the tongue every 5 (five) minutes as needed for chest pain.  24 tablet  1  . pantoprazole (PROTONIX) 40 MG tablet Take 1 tablet (40 mg total) by mouth 2 (two) times daily.  180 tablet  3  . promethazine (PHENERGAN) 25 MG tablet Take 1 tablet by mouth as needed.      . simvastatin (ZOCOR)  20 MG tablet Take 1 tablet (20 mg total) by mouth at bedtime.  90 tablet  3   No current facility-administered medications on file prior to visit.      Current Medications, Allergies, Past Medical History, Past Surgical History, Family History and Social History were reviewed in [employer] record.  Physical Exam: BP 120/80  Pulse 64  Temp(Src) 98.1 F (36.7 C)  Ht 5' 2.25" (1.581 m)  Wt 143 lb (64.864 kg)  BMI 25.95 kg/m2  General: Well developed , well nourished, no acute distress Head: Normocephalic and atraumatic Eyes:  sclerae anicteric, EOMI Ears: Normal auditory acuity Mouth: No deformity or lesions Lungs: Clear throughout to auscultation, no chest wall tenderness  Heart: Regular rate and rhythm; no murmurs, rubs or bruits Abdomen: Soft, non tender and non distended. No masses, hepatosplenomegaly or hernias noted. Normal Bowel sounds Musculoskeletal: Symmetrical with no gross deformities  Pulses:  Normal pulses noted Extremities: No clubbing, cyanosis, edema or deformities noted Neurological: Alert oriented x 4, grossly nonfocal Psychological:  Alert and cooperative. Normal mood and affect  Assessment and Recommendations:  1. Chest pain- I understand her frustrations and I would recommend further evaluation- endoscopy and if this is unremarkable ?cardiac cath.  There are no abnormalities on exam or her recent blood work that point to something else. She will call Dr. Russella Dar and let me know what the next step would be.  Last note did indicate he may consider further GI evaluation.    2.  GERD-   Followed by Dr. Russella Dar. Continue current meds, see above.

## 2012-05-28 ENCOUNTER — Telehealth: Payer: Self-pay | Admitting: Gastroenterology

## 2012-05-28 NOTE — Telephone Encounter (Signed)
Pt states she has had 4 more episodes of bad gerd/chest pain since December. States the episodes seem to be coming on more frequently. She states she chews some Pepcid and Tums and it finally eases off. Pt saw Dr. Dayton Martes on 05/18/12, OV note state pt may need repeat EGD. Dr. Russella Dar please advise.

## 2012-05-29 NOTE — Telephone Encounter (Signed)
Please schedule EGD to evaluate further.

## 2012-05-31 ENCOUNTER — Telehealth: Payer: Self-pay

## 2012-05-31 NOTE — Telephone Encounter (Signed)
Great! Thanks for the update.

## 2012-05-31 NOTE — Telephone Encounter (Signed)
Advised patient

## 2012-05-31 NOTE — Telephone Encounter (Signed)
Pt left v/m to let Dr Dayton Martes know that Dr Christin Bach decided to do endoscopy on 06/15/12.

## 2012-05-31 NOTE — Telephone Encounter (Signed)
Advised her Dr Russella Dar suggests an EGD and she agreed and is scheduled for 06/15/12/.

## 2012-06-03 ENCOUNTER — Ambulatory Visit (AMBULATORY_SURGERY_CENTER): Payer: Medicare Other | Admitting: *Deleted

## 2012-06-03 VITALS — Ht 62.5 in | Wt 142.0 lb

## 2012-06-03 DIAGNOSIS — R072 Precordial pain: Secondary | ICD-10-CM

## 2012-06-03 DIAGNOSIS — K219 Gastro-esophageal reflux disease without esophagitis: Secondary | ICD-10-CM

## 2012-06-04 ENCOUNTER — Encounter: Payer: Self-pay | Admitting: Gastroenterology

## 2012-06-09 ENCOUNTER — Encounter: Payer: Self-pay | Admitting: Family Medicine

## 2012-06-14 ENCOUNTER — Telehealth: Payer: Self-pay | Admitting: Family Medicine

## 2012-06-14 ENCOUNTER — Telehealth: Payer: Self-pay | Admitting: Gastroenterology

## 2012-06-14 NOTE — Telephone Encounter (Signed)
I am so sorry to hear this.  Please call to check on patient.

## 2012-06-14 NOTE — Telephone Encounter (Signed)
[facility] Triage Call Report Triage Record Num: [id number] Operator: Malachi Paradise Patient Name: [name] Call Date & Time: 06/13/2012 8:16:25AM Patient Phone: [phone] PCP: Patient Gender: Female PCP Fax : Patient DOB: [birth year] Practice Name:  [facility] Reason for Call: Caller: Jennifer/Patient; PCP: Ruthe Mannan (Family Practice); CB#: [phone]; Call regarding Husband died would like ; Onset 21-Jun-2012 Pt's daughter in law calling to request medication stronger than Xanax .25mg . Pt's husband passed away yesterday unexpectedly. Contacted MD on call Dr Abner Greenspan and orders received for Diazepam 5 mg Take 1/2 to 2 tabs BID PRN for anxiety or insomnia, dipense #20 no refills. Rx called to [facility], [address] [place], [phone] message left on voice mail. Protocol(s) Used: Office Note Recommended Outcome per Protocol: Information Noted and Sent to Office Reason for Outcome: Caller information to office Care Advice: ~ 06/13/2012 8:36:14AM Page 1 of 1 [id number]

## 2012-06-14 NOTE — Telephone Encounter (Signed)
Of course, I am very sorry for her loss.

## 2012-06-14 NOTE — Telephone Encounter (Signed)
Left voicemail for pt expressing my sympathies and asked her to call me if she needed anything.

## 2012-06-15 ENCOUNTER — Encounter: Payer: Medicare Other | Admitting: Gastroenterology

## 2012-06-22 ENCOUNTER — Telehealth: Payer: Self-pay | Admitting: Gastroenterology

## 2012-06-22 MED ORDER — PANTOPRAZOLE SODIUM 40 MG PO TBEC: 40.0000 mg | DELAYED_RELEASE_TABLET | Freq: Two times a day (BID) | ORAL | Status: DC

## 2012-06-22 NOTE — Telephone Encounter (Signed)
Prescription sent to patient's mail order pharmacy and pt notified that I sent medicine. Pt states she will call back to reschedule her Endoscopy.

## 2012-06-29 ENCOUNTER — Telehealth: Payer: Self-pay | Admitting: Family Medicine

## 2012-06-29 MED ORDER — DIAZEPAM 5 MG PO TABS: 5.0000 mg | ORAL_TABLET | Freq: Two times a day (BID) | ORAL | Status: DC | PRN

## 2012-06-29 NOTE — Telephone Encounter (Signed)
Yes ok to refill valium as entered below.  I would like to see her in a few weeks if the valium is not helping.

## 2012-06-29 NOTE — Telephone Encounter (Signed)
Patient Information:  Caller Name: Jay[place]  Phone: [phone]  Patient: [name]  Gender: Female  DOB: 19-Nov-1945  Age: 67 Years  PCP: Ruthe Mannan [facility])  Office Follow Up:  Does the office need to follow up with this patient?: Yes  Instructions For The Office: Declines appt; requests refill on valium Rx; info to office for provider review/callback krs/can  RN Note:  States she is having flashbacks to the night her husband was involved in the accident, and she was performing CPR, and this keeps her from sleeping well.  Per insomnia protocol, advised being seen within 3 days; advised that another medication may be more appropriate for long term.  Patient declines appt; info to office for provider review/callback. May reach patient at [phone].  krs/can  Symptoms  Reason For Call & Symptoms: would like renewal of valium 5mg , which she has been taking since 06/13/12 to help her sleep since her husband died.  Her children are leaving home and she feels she would benefit from it.  Taking about 2 tabs per day.  Reviewed Health History In EMR: Yes  Reviewed Medications In EMR: Yes  Reviewed Allergies In EMR: Yes  Reviewed Surgeries / Procedures: Yes  Date of Onset of Symptoms: 06/13/2012  Guideline(s) Used:  Insomnia  Disposition Per Guideline:   See Within 3 Days in Office  Reason For Disposition Reached:   Insomnia persists > 1 week and following Insomnia Care Advice  Advice Given:  N/A  Patient Refused Recommendation:  Patient Requests Prescription  Declines appt; requests refill on valium Rx; info to office for provider review/callback krs/can

## 2012-06-29 NOTE — Telephone Encounter (Signed)
Medicine called to pharmacy, advised patient.

## 2012-07-12 ENCOUNTER — Ambulatory Visit (INDEPENDENT_AMBULATORY_CARE_PROVIDER_SITE_OTHER): Payer: Medicare Other | Admitting: Family Medicine

## 2012-07-12 ENCOUNTER — Encounter: Payer: Self-pay | Admitting: Family Medicine

## 2012-07-12 VITALS — BP 120/74 | HR 82 | Temp 98.3°F | Wt 137.0 lb

## 2012-07-12 DIAGNOSIS — G4700 Insomnia, unspecified: Secondary | ICD-10-CM

## 2012-07-12 DIAGNOSIS — F4321 Adjustment disorder with depressed mood: Secondary | ICD-10-CM

## 2012-07-12 MED ORDER — TRAZODONE HCL 50 MG PO TABS: 25.0000 mg | ORAL_TABLET | Freq: Every evening | ORAL | Status: DC | PRN

## 2012-07-12 NOTE — Progress Notes (Signed)
Subjective:    Patient ID: [name], female    DOB: 25-Apr-1945, 67 y.o.   MRN: [id number]  HPI Very pleasant 67 yo female here to discuss insomnia.  Unfortunately, her husband died unexpectedly a few weeks ago.  She has been taking Valium as needed but still not sleeping well. She was the person who found her husband and she is still having flash backs from that day.  She is tearful as expected but her kids are trying to keep her busy.  Has grandchildren she loves spending time with.  Appetite has decreased.  She was used to cooking big meals for her husband.  She does feel it is picking up a [place]. Wt Readings from Last 3 Encounters:  07/12/12 137 lb (62.143 kg)  06/03/12 142 lb (64.411 kg)  05/18/12 143 lb (64.864 kg)   Does not want to stay in her current home. She is looking for something smaller.   Feels she is a fog but does not feel depressed.  She can fall asleep with Valium but usually wakes up 1-2 hours later.  She is seeing a grief counsellor named Mindi Junker and talking with her Renato Gails regularly.  Patient Active Problem List   Diagnosis Date Noted  . Insomnia 07/12/2012  . Dyslipidemia 04/04/2011  . HYPOKALEMIA 10/10/2009  . HIATAL HERNIA 10/08/2007  . COLONIC POLYPS, HYPERPLASTIC, HX OF 10/08/2007  . CARDIOMYOPATHY 06/22/2007  . EROSIVE ESOPHAGITIS 06/22/2007  . GERD 06/22/2007  . HYPERCHOLESTEROLEMIA, PURE 08/11/2006   Past Medical History  Diagnosis Date  . Migraine   . Cardiomyopathy     nonischemic  . GERD (gastroesophageal reflux disease)   . Erosive esophagitis     LA Classification Grade B  . Hyperlipidemia   . Osteopenia   . Hiatal hernia   . Melanoma     basil cell  . Diabetes mellitus     pt states she is not diabetic  . Sessile colonic polyp 10/2011    adenoma   Past Surgical History  Procedure Laterality Date  . Tubal ligation    . Vaginal hysterectomy    . Appendectomy     History  Substance Use Topics  . Smoking status:  Former Smoker    Quit date: 06/03/1981  . Smokeless tobacco: Never Used  . Alcohol Use: 1.8 oz/week    3 Glasses of wine per week   Family History  Problem Relation Age of Onset  . Heart disease Father   . Alzheimer's disease Mother   . Colon cancer Neg Hx   . Stomach cancer Neg Hx   . Rectal cancer Neg Hx   . Esophageal cancer Neg Hx    Allergies  Allergen Reactions  . Penicillins Hives  . Azithromycin Other (See Comments)    Pt denies-states she does take a z pack  . Lansoprazole     REACTION: headaches  . Sulfa Antibiotics Hives  . Ceclor (Cefaclor) Itching and Rash  . Ceftin (Cefuroxime Axetil) Rash  . Clindamycin/Lincomycin Itching and Rash  . Latex Itching and Rash  . Septra (Sulfamethoxazole W-Trimethoprim) Rash   Current Outpatient Prescriptions on File Prior to Visit  Medication Sig Dispense Refill  . amLODipine (NORVASC) 2.5 MG tablet Take 1 tablet (2.5 mg total) by mouth daily.  30 tablet  6  . Calcium Carbonate-Vitamin D (CALCIUM 600 + D PO) Take 1 tablet by mouth 2 (two) times daily.      . carvedilol (COREG) 12.5 MG tablet Take 1 tablet (12.5  mg total) by mouth 2 (two) times daily with a meal.  180 tablet  3  . clobetasol ointment (TEMOVATE) 0.05 % Apply 1 application topically as needed.      . diazepam (VALIUM) 5 MG tablet Take 1 tablet (5 mg total) by mouth every 12 (twelve) hours as needed for anxiety.  30 tablet  1  . enalapril (VASOTEC) 10 MG tablet Take 1 tablet (10 mg total) by mouth daily.  90 tablet  3  . metroNIDAZOLE (METROGEL) 1 % gel Apply 1 application topically daily.       . Multiple Vitamin (MULTIVITAMIN) capsule Take 1 capsule by mouth daily.        . nitroGLYCERIN (NITROSTAT) 0.4 MG SL tablet Place 1 tablet (0.4 mg total) under the tongue every 5 (five) minutes as needed for chest pain.  24 tablet  1  . pantoprazole (PROTONIX) 40 MG tablet Take 1 tablet (40 mg total) by mouth 2 (two) times daily.  180 tablet  2  . promethazine (PHENERGAN) 25  MG tablet Take 1 tablet by mouth as needed.      . simvastatin (ZOCOR) 20 MG tablet Take 1 tablet (20 mg total) by mouth at bedtime.  90 tablet  3   No current facility-administered medications on file prior to visit.   The PMH, PSH, Social History, Family History, Medications, and allergies have been reviewed in [facility], and have been updated if relevant.   Review of Systems See HPI    Objective:   Physical Exam BP 120/74  Pulse 82  Temp(Src) 98.3 F (36.8 C)  Wt 137 lb (62.143 kg)  BMI 24.64 kg/m2  General:  Well-developed,well-nourished,in no acute distress; alert,appropriate and cooperative throughout examination Head:  normocephalic and atraumatic.   Psych:  Cognition and judgment appear intact. Alert and cooperative with normal attention span and concentration. No apparent delusions, illusions, hallucinations    Assessment & Plan:  1. Insomnia Due to acute grief.  Start Trazadone 25-50 mg qhs prn insomnia.  She will call me in the next week or two with an update.  Ok to continue prn Valium as well. See below.  2. Grief reaction >25 min spent with face to face with patient, >50% counseling and/or coordinating care.  She has wonderful support system and seems to be adjusting as best she can.  I did recommend seeing Lorenda Cahill, given her vivid flashbacks.  She will call me and let me know. If trazadone ineffective, we did talk about starting a daily SSRI. The patient indicates understanding of these issues and agrees with the plan.

## 2012-07-12 NOTE — Patient Instructions (Addendum)
I'm so sorry for your loss. Please call me with anything- [phone] (cell).  [name] is an excellent therapist.  Call me if you would like to see her.  Try Allegra over the counter.

## 2012-07-19 ENCOUNTER — Telehealth: Payer: Self-pay

## 2012-07-19 MED ORDER — ZOLPIDEM TARTRATE 5 MG PO TABS: 5.0000 mg | ORAL_TABLET | Freq: Every evening | ORAL | Status: DC | PRN

## 2012-07-19 NOTE — Telephone Encounter (Signed)
We could try low dose ambien for a short period of time if she is interested.  As we discussed previously, this can be habit forming but used for short period of time, it is very appropriate. Please let me know and I will send in rx.

## 2012-07-19 NOTE — Telephone Encounter (Signed)
Please call in rx as entered below. 

## 2012-07-19 NOTE — Telephone Encounter (Signed)
ambien called to pharmacy

## 2012-07-19 NOTE — Telephone Encounter (Signed)
Pt left v/m; seen 07/12/12 and taking Trazodone 50 mg nightly but wakes up after 3 hours and cannot go back to sleep. Pt request different med sent to [facility] pharmacy.  Please advise.

## 2012-07-19 NOTE — Telephone Encounter (Signed)
Advised patient as instructed.  She does want to try Remus Loffler, uses [facility].

## 2012-07-28 ENCOUNTER — Encounter: Payer: Self-pay | Admitting: Family Medicine

## 2012-09-20 ENCOUNTER — Other Ambulatory Visit: Payer: Self-pay

## 2012-09-20 MED ORDER — ZOLPIDEM TARTRATE 5 MG PO TABS: 5.0000 mg | ORAL_TABLET | Freq: Every evening | ORAL | Status: DC | PRN

## 2012-09-20 NOTE — Telephone Encounter (Signed)
Pt left v/m requesting refill ambien to [facility]. Since pts husband died pt having problems sleeping.Please advise.

## 2012-09-20 NOTE — Telephone Encounter (Signed)
Refill called to [facility].

## 2012-09-21 NOTE — Telephone Encounter (Signed)
Pt called for status of ambien refill; spoke with [facility] and rx ready for pick up. Pt advised.

## 2012-10-20 ENCOUNTER — Encounter: Payer: Self-pay | Admitting: Family Medicine

## 2012-10-20 ENCOUNTER — Ambulatory Visit (INDEPENDENT_AMBULATORY_CARE_PROVIDER_SITE_OTHER): Payer: Medicare Other | Admitting: Family Medicine

## 2012-10-20 VITALS — BP 120/70 | HR 72 | Temp 98.2°F | Wt 130.0 lb

## 2012-10-20 DIAGNOSIS — F4321 Adjustment disorder with depressed mood: Secondary | ICD-10-CM

## 2012-10-20 DIAGNOSIS — G4700 Insomnia, unspecified: Secondary | ICD-10-CM

## 2012-10-20 DIAGNOSIS — H539 Unspecified visual disturbance: Secondary | ICD-10-CM

## 2012-10-20 MED ORDER — ZOLPIDEM TARTRATE 5 MG PO TABS: 7.5000 mg | ORAL_TABLET | Freq: Every evening | ORAL | Status: DC | PRN

## 2012-10-20 NOTE — Patient Instructions (Signed)
No clear blood pressure or neurologic issues. Symptoms likey due to anxiety and stress. Follow up if mood not controlled or if eye symptoms recur.

## 2012-10-20 NOTE — Assessment & Plan Note (Signed)
Poor control on 5 mg at bedtime.. Does better i=on 7.5 mg at bedtime. Refilled med.

## 2012-10-20 NOTE — Progress Notes (Signed)
  Subjective:    Patient ID: [name], female    DOB: 01-Oct-1945, 67 y.o.   MRN: [id number]  HPI  67 year old female with cardiomyopathy, idiopathic, LBBB and stress test Pt of Dr. Elmer Sow presents following an episode yesterday  of blurry vision and flashes of lights. Lasted 35 -40 min. After being outside in heat  No associated headache. No N/V, no fever. No numbness, no weakness. No confusion. No vision issues now.  She had similar issue in last year.. Had eval with cardiologist and tests were normal  Had routine eye eval 2 weeks ago, none now.  Husband passed away unexpectedly 4 months a go, increase stress, not sleeping.   Review of Systems  Constitutional: Negative for fever and fatigue.  HENT: Negative for ear pain.   Eyes: Negative for pain.  Respiratory: Negative for chest tightness and shortness of breath.   Cardiovascular: Negative for chest pain, palpitations and leg swelling.  Gastrointestinal: Negative for abdominal pain.  Genitourinary: Negative for dysuria.  Neurological: Negative for dizziness, seizures, facial asymmetry, speech difficulty, light-headedness and headaches.  Psychiatric/Behavioral: Positive for sleep disturbance and agitation. Negative for suicidal ideas. The patient is nervous/anxious.        Objective:   Physical Exam  Constitutional: Vital signs are normal. She appears well-developed and well-nourished. She is cooperative.  Non-toxic appearance. She does not appear ill. No distress.  HENT:  Head: Normocephalic.  Right Ear: Hearing, tympanic membrane, external ear and ear canal normal. Tympanic membrane is not erythematous, not retracted and not bulging.  Left Ear: Hearing, tympanic membrane, external ear and ear canal normal. Tympanic membrane is not erythematous, not retracted and not bulging.  Nose: No mucosal edema or rhinorrhea. Right sinus exhibits no maxillary sinus tenderness and no frontal sinus tenderness. Left sinus exhibits no  maxillary sinus tenderness and no frontal sinus tenderness.  Mouth/Throat: Uvula is midline, oropharynx is clear and moist and mucous membranes are normal.  Eyes: Conjunctivae, EOM and lids are normal. Pupils are equal, round, and reactive to light. Lids are everted and swept, no foreign bodies found.  Fundoscopic exam:      The right eye shows no hemorrhage and no papilledema.       The left eye shows no hemorrhage and no papilledema.  Neck: Trachea normal and normal range of motion. Neck supple. Carotid bruit is not present. No mass and no thyromegaly present.  Cardiovascular: Normal rate, regular rhythm, S1 normal, S2 normal, normal heart sounds, intact distal pulses and normal pulses.  Exam reveals no gallop and no friction rub.   No murmur heard. Pulmonary/Chest: Effort normal and breath sounds normal. Not tachypneic. No respiratory distress. She has no decreased breath sounds. She has no wheezes. She has no rhonchi. She has no rales.  Abdominal: Soft. Normal appearance and bowel sounds are normal. There is no tenderness.  Neurological: She is alert.  Skin: Skin is warm, dry and intact. No rash noted.  Psychiatric: Her speech is normal. Judgment and thought content normal. Her mood appears anxious. She is agitated. Cognition and memory are normal. She does not exhibit a depressed mood.          Assessment & Plan:

## 2012-10-20 NOTE — Assessment & Plan Note (Signed)
Nml BP, no red flags , nml neuro exam.  ? Due to stress and recent anxiety and insomnia due to loss of husband

## 2012-10-20 NOTE — Assessment & Plan Note (Signed)
No clear depression, some PTSD like symtpoms given she found husband and is having flash backs. Offered SSRI vs anxiolytic as well as counsleing /support groups. Pt not interested in these at this time. If not improving... Return for follow up.

## 2012-10-27 ENCOUNTER — Other Ambulatory Visit: Payer: Self-pay

## 2012-10-27 MED ORDER — AMLODIPINE BESYLATE 2.5 MG PO TABS: 2.5000 mg | ORAL_TABLET | Freq: Every day | ORAL | Status: DC

## 2012-11-15 ENCOUNTER — Encounter: Payer: Self-pay | Admitting: Radiology

## 2012-11-16 ENCOUNTER — Ambulatory Visit (INDEPENDENT_AMBULATORY_CARE_PROVIDER_SITE_OTHER): Payer: Medicare Other | Admitting: Family Medicine

## 2012-11-16 ENCOUNTER — Encounter: Payer: Self-pay | Admitting: Family Medicine

## 2012-11-16 VITALS — BP 120/70 | HR 68 | Temp 98.1°F | Wt 129.0 lb

## 2012-11-16 DIAGNOSIS — G4700 Insomnia, unspecified: Secondary | ICD-10-CM

## 2012-11-16 DIAGNOSIS — F4321 Adjustment disorder with depressed mood: Secondary | ICD-10-CM

## 2012-11-16 MED ORDER — ESCITALOPRAM OXALATE 10 MG PO TABS: ORAL_TABLET | ORAL | Status: DC

## 2012-11-16 MED ORDER — ZOLPIDEM TARTRATE ER 12.5 MG PO TBCR: 12.5000 mg | EXTENDED_RELEASE_TABLET | Freq: Every evening | ORAL | Status: DC | PRN

## 2012-11-16 NOTE — Progress Notes (Signed)
Subjective:    Patient ID: [name], female    DOB: Jul 11, 1945, 67 y.o.   MRN: [id number]  HPI Very pleasant 67 yo female here to discuss insomnia and ? depression.  Unfortunately, her husband died unexpectedly five months ago.   She has been declining psychotherapy and SSRIs and has been taking Ambien 7.5 mg qhs for insomnia.  Still having flashbacks of finding her husband.  Still having night time awakenings.  More tearful this past month, she thinks because the holidays are coming up. Appetite still not great. Wt Readings from Last 3 Encounters:  11/16/12 129 lb (58.514 kg)  10/20/12 130 lb (58.968 kg)  07/12/12 137 lb (62.143 kg)   Children are very supportive.  No SI or HI.  Patient Active Problem List   Diagnosis Date Noted  . Vision changes 10/20/2012  . Insomnia 07/12/2012  . Grief reaction 07/12/2012  . Dyslipidemia 04/04/2011  . HYPOKALEMIA 10/10/2009  . HIATAL HERNIA 10/08/2007  . COLONIC POLYPS, HYPERPLASTIC, HX OF 10/08/2007  . CARDIOMYOPATHY 06/22/2007  . EROSIVE ESOPHAGITIS 06/22/2007  . GERD 06/22/2007  . HYPERCHOLESTEROLEMIA, PURE 08/11/2006   Past Medical History  Diagnosis Date  . Migraine   . Cardiomyopathy     nonischemic  . GERD (gastroesophageal reflux disease)   . Erosive esophagitis     LA Classification Grade B  . Hyperlipidemia   . Osteopenia   . Hiatal hernia   . Melanoma     basil cell  . Diabetes mellitus     pt states she is not diabetic  . Sessile colonic polyp 10/2011    adenoma   Past Surgical History  Procedure Laterality Date  . Tubal ligation    . Vaginal hysterectomy    . Appendectomy     History  Substance Use Topics  . Smoking status: Former Smoker    Quit date: 06/03/1981  . Smokeless tobacco: Never Used  . Alcohol Use: 1.8 oz/week    3 Glasses of wine per week   Family History  Problem Relation Age of Onset  . Heart disease Father   . Alzheimer's disease Mother   . Colon cancer Neg Hx   . Stomach  cancer Neg Hx   . Rectal cancer Neg Hx   . Esophageal cancer Neg Hx    Allergies  Allergen Reactions  . Penicillins Hives  . Azithromycin Other (See Comments)    Pt denies-states she does take a z pack  . Lansoprazole     REACTION: headaches  . Sulfa Antibiotics Hives  . Ceclor [Cefaclor] Itching and Rash  . Ceftin [Cefuroxime Axetil] Rash  . Clindamycin/Lincomycin Itching and Rash  . Latex Itching and Rash  . Septra [Sulfamethoxazole W-Trimethoprim] Rash   Current Outpatient Prescriptions on File Prior to Visit  Medication Sig Dispense Refill  . amLODipine (NORVASC) 2.5 MG tablet Take 1 tablet (2.5 mg total) by mouth daily.  30 tablet  2  . Calcium Carbonate-Vitamin D (CALCIUM 600 + D PO) Take 1 tablet by mouth 2 (two) times daily.      . carvedilol (COREG) 12.5 MG tablet Take 1 tablet (12.5 mg total) by mouth 2 (two) times daily with a meal.  180 tablet  3  . clobetasol ointment (TEMOVATE) 0.05 % Apply 1 application topically as needed.      . enalapril (VASOTEC) 10 MG tablet Take 1 tablet (10 mg total) by mouth daily.  90 tablet  3  . metroNIDAZOLE (METROGEL) 1 % gel Apply 1  application topically daily.       . Multiple Vitamin (MULTIVITAMIN) capsule Take 1 capsule by mouth daily.        . nitroGLYCERIN (NITROSTAT) 0.4 MG SL tablet Place 1 tablet (0.4 mg total) under the tongue every 5 (five) minutes as needed for chest pain.  24 tablet  1  . pantoprazole (PROTONIX) 40 MG tablet Take 1 tablet (40 mg total) by mouth 2 (two) times daily.  180 tablet  2  . promethazine (PHENERGAN) 25 MG tablet Take 1 tablet by mouth as needed.      . simvastatin (ZOCOR) 20 MG tablet Take 1 tablet (20 mg total) by mouth at bedtime.  90 tablet  3  . zolpidem (AMBIEN) 5 MG tablet Take 1.5 tablets (7.5 mg total) by mouth at bedtime as needed for sleep.  45 tablet  0   No current facility-administered medications on file prior to visit.   The PMH, PSH, Social History, Family History, Medications, and  allergies have been reviewed in [facility], and have been updated if relevant.   Review of Systems See HPI    Objective:   Physical Exam BP 120/70  Pulse 68  Temp(Src) 98.1 F (36.7 C)  Wt 129 lb (58.514 kg)  BMI 23.2 kg/m2 Wt Readings from Last 3 Encounters:  11/16/12 129 lb (58.514 kg)  10/20/12 130 lb (58.968 kg)  07/12/12 137 lb (62.143 kg)    General:  Well-developed,well-nourished,in no acute distress; alert,appropriate and cooperative throughout examination Head:  normocephalic and atraumatic.   Psych:  Cognition and judgment appear intact. Alert and cooperative with normal attention span and concentration. No apparent delusions, illusions, hallucinations    Assessment & Plan:  1. Insomnia Persistent.  Does have component of PTSD- I advised psychotherapy again today but she is very resistant.  Will change ambien to Ambien CR 6.25 mg qhs as needed for insomnia.  2. Grief reaction >25 min spent with face to face with patient, >50% counseling and/or coordinating care. Still within normal grief pattern however her symptoms are deteriorating and she is willing to start an SSRI.  Start Lexapro 10 mg daily. She will call me in a few weeks with an update. The patient indicates understanding of these issues and agrees with the plan.

## 2012-11-16 NOTE — Patient Instructions (Addendum)
Good to see you. We are increasing your ambien to 6.25 mg CR nightly. Let me know how that works for you.  We are also starting Lexapro 10 mg nightly.  Please call me with an update in a few weeks.

## 2012-12-21 ENCOUNTER — Other Ambulatory Visit: Payer: Self-pay | Admitting: *Deleted

## 2012-12-21 MED ORDER — ZOLPIDEM TARTRATE ER 12.5 MG PO TBCR: 12.5000 mg | EXTENDED_RELEASE_TABLET | Freq: Every evening | ORAL | Status: DC | PRN

## 2012-12-21 NOTE — Telephone Encounter (Signed)
Last filled 11/16/12

## 2012-12-22 NOTE — Telephone Encounter (Signed)
Phoned in to [facility].

## 2012-12-28 ENCOUNTER — Encounter: Payer: Self-pay | Admitting: Radiology

## 2012-12-29 ENCOUNTER — Ambulatory Visit: Payer: Medicare Other | Admitting: Family Medicine

## 2013-01-05 ENCOUNTER — Ambulatory Visit (INDEPENDENT_AMBULATORY_CARE_PROVIDER_SITE_OTHER): Payer: Medicare Other | Admitting: Internal Medicine

## 2013-01-05 ENCOUNTER — Encounter: Payer: Self-pay | Admitting: Internal Medicine

## 2013-01-05 VITALS — BP 126/72 | HR 68 | Temp 98.2°F | Wt 125.5 lb

## 2013-01-05 DIAGNOSIS — F32A Depression, unspecified: Secondary | ICD-10-CM

## 2013-01-05 DIAGNOSIS — F329 Major depressive disorder, single episode, unspecified: Secondary | ICD-10-CM

## 2013-01-05 DIAGNOSIS — F4321 Adjustment disorder with depressed mood: Secondary | ICD-10-CM

## 2013-01-05 DIAGNOSIS — F3289 Other specified depressive episodes: Secondary | ICD-10-CM

## 2013-01-05 DIAGNOSIS — G4700 Insomnia, unspecified: Secondary | ICD-10-CM

## 2013-01-05 MED ORDER — ESCITALOPRAM OXALATE 20 MG PO TABS: 20.0000 mg | ORAL_TABLET | Freq: Every day | ORAL | Status: DC

## 2013-01-05 NOTE — Progress Notes (Signed)
Subjective:    Patient ID: [name], female    DOB: [birth year], 67 y.o.   MRN: [id number]  HPI  Pt presents to the clinic today to f/u insomnia and depression. This started after she unexpectedly lost her husband in 06/2012. She has been using Ambien at night to help her sleep. At her last visit, she was started on Lexapro. Up until that time, she had declined grief counseling or SSRI therapy. Since starting on the Lexapro, she has not noticed much improvement. She has considered if we could go up on the Lexapro dose. She does have a supportive family. She spends lots of time with her kids and grand kids which helps distract her. She does report still having occasional crying spells which seems to be worse when she doesn't sleep well.  Review of Systems      Past Medical History  Diagnosis Date  . Migraine   . Cardiomyopathy     nonischemic  . GERD (gastroesophageal reflux disease)   . Erosive esophagitis     LA Classification Grade B  . Hyperlipidemia   . Osteopenia   . Hiatal hernia   . Melanoma     basil cell  . Diabetes mellitus     pt states she is not diabetic  . Sessile colonic polyp 10/2011    adenoma    Current Outpatient Prescriptions  Medication Sig Dispense Refill  . amLODipine (NORVASC) 2.5 MG tablet Take 1 tablet (2.5 mg total) by mouth daily.  30 tablet  2  . Calcium Carbonate-Vitamin D (CALCIUM 600 + D PO) Take 1 tablet by mouth 2 (two) times daily.      . carvedilol (COREG) 12.5 MG tablet Take 1 tablet (12.5 mg total) by mouth 2 (two) times daily with a meal.  180 tablet  3  . clobetasol ointment (TEMOVATE) 0.05 % Apply 1 application topically as needed.      . enalapril (VASOTEC) 10 MG tablet Take 1 tablet (10 mg total) by mouth daily.  90 tablet  3  . escitalopram (LEXAPRO) 10 MG tablet 1/2 tablet (5 mg) daily for 3 days, then increase to 1 full tablet daily  30 tablet  3  . metroNIDAZOLE (METROGEL) 1 % gel Apply 1 application topically daily.       .  Multiple Vitamin (MULTIVITAMIN) capsule Take 1 capsule by mouth daily.        . nitroGLYCERIN (NITROSTAT) 0.4 MG SL tablet Place 1 tablet (0.4 mg total) under the tongue every 5 (five) minutes as needed for chest pain.  24 tablet  1  . pantoprazole (PROTONIX) 40 MG tablet Take 1 tablet (40 mg total) by mouth 2 (two) times daily.  180 tablet  2  . promethazine (PHENERGAN) 25 MG tablet Take 1 tablet by mouth as needed.      . simvastatin (ZOCOR) 20 MG tablet Take 1 tablet (20 mg total) by mouth at bedtime.  90 tablet  3  . zolpidem (AMBIEN CR) 12.5 MG CR tablet Take 1 tablet (12.5 mg total) by mouth at bedtime as needed for sleep.  30 tablet  0   No current facility-administered medications for this visit.    Allergies  Allergen Reactions  . Penicillins Hives  . Azithromycin Other (See Comments)    Pt denies-states she does take a z pack  . Lansoprazole     REACTION: headaches  . Sulfa Antibiotics Hives  . Ceclor [Cefaclor] Itching and Rash  . Ceftin [Cefuroxime Axetil]  Rash  . Clindamycin/Lincomycin Itching and Rash  . Latex Itching and Rash  . Septra [Sulfamethoxazole-Trimethoprim] Rash    Family History  Problem Relation Age of Onset  . Heart disease Father   . Alzheimer's disease Mother   . Colon cancer Neg Hx   . Stomach cancer Neg Hx   . Rectal cancer Neg Hx   . Esophageal cancer Neg Hx     History   Social History  . Marital Status: Married    Spouse Name: N/A    Number of Children: 3  . Years of Education: N/A   Occupational History  . Housewife    Social History Main Topics  . Smoking status: Former Smoker    Quit date: 06/03/1981  . Smokeless tobacco: Never Used  . Alcohol Use: 1.8 oz/week    3 Glasses of wine per week  . Drug Use: No  . Sexual Activity: Not on file   Other Topics Concern  . Not on file   Social History Narrative  . No narrative on file     Constitutional: Denies fever, malaise, fatigue, headache or abrupt weight changes.   Psych: Pt reports insomnia and depression. Denies anxiety, SI/HI.  No other specific complaints in a complete review of systems (except as listed in HPI above).  Objective:   Physical Exam   BP 126/72  Pulse 68  Temp(Src) 98.2 F (36.8 C) (Oral)  Wt 125 lb 8 oz (56.926 kg)  BMI 22.57 kg/m2 Wt Readings from Last 3 Encounters:  01/05/13 125 lb 8 oz (56.926 kg)  11/16/12 129 lb (58.514 kg)  10/20/12 130 lb (58.968 kg)    General: Appears her stated age, well developed, well nourished in NAD. Cardiovascular: Normal rate and rhythm. S1,S2 noted.  No murmur, rubs or gallops noted. No JVD or BLE edema. No carotid bruits noted. Pulmonary/Chest: Normal effort and positive vesicular breath sounds. No respiratory distress. No wheezes, rales or ronchi noted.  Psychiatric: Mood tearful today and affect normal. Behavior is normal. Judgment and thought content normal.     BMET    Component Value Date/Time   NA 139 02/19/2012 1023   K 3.7 02/19/2012 1023   CL 107 02/19/2012 1023   CO2 27 02/19/2012 1023   GLUCOSE 111* 02/19/2012 1023   BUN 16 02/19/2012 1023   CREATININE 0.6 02/19/2012 1023   CALCIUM 9.4 02/19/2012 1023   GFRNONAA 110.83 10/11/2009 1058   GFRAA 130 11/22/2007 1514    Lipid Panel     Component Value Date/Time   CHOL 180 04/03/2011 1009   TRIG 224.0* 04/03/2011 1009   HDL 57.80 04/03/2011 1009   CHOLHDL 3 04/03/2011 1009   VLDL 44.8* 04/03/2011 1009   LDLCALC See Comment mg/dL 06/16/8117 1478    CBC    Component Value Date/Time   WBC 4.8 03/11/2012 1341   RBC 4.39 03/11/2012 1341   HGB 12.3 03/11/2012 1341   HCT 38.7 03/11/2012 1341   PLT 240 03/11/2012 1341   MCV 88.2 03/11/2012 1341   MCH 28.0 03/11/2012 1341   MCHC 31.8 03/11/2012 1341   RDW 12.3 03/11/2012 1341   LYMPHSABS 1.4 03/11/2012 1341   MONOABS 0.4 03/11/2012 1341   EOSABS 0.2 03/11/2012 1341   BASOSABS 0.0 03/11/2012 1341    Hgb A1C Lab Results  Component Value Date   HGBA1C 5.6 07/09/2009        Assessment &  Plan:

## 2013-01-05 NOTE — Assessment & Plan Note (Signed)
Secondary to loss of her spouse Support offered today Pt declines counseling or therapy at this time

## 2013-01-05 NOTE — Patient Instructions (Signed)

## 2013-01-05 NOTE — Assessment & Plan Note (Signed)
Well controlled on Ambien 12.5 mg CR

## 2013-01-05 NOTE — Assessment & Plan Note (Signed)
Lexapro not very effective We will try increasing the dose to 20 mg daily If continues to be ineffective at higher dose, consider switching to a different SSRI

## 2013-01-10 ENCOUNTER — Telehealth: Payer: Self-pay | Admitting: Family Medicine

## 2013-01-10 ENCOUNTER — Encounter (HOSPITAL_COMMUNITY): Payer: Self-pay | Admitting: Emergency Medicine

## 2013-01-10 ENCOUNTER — Emergency Department (HOSPITAL_COMMUNITY)
Admission: EM | Admit: 2013-01-10 | Discharge: 2013-01-10 | Disposition: A | Discharge status: Home / Self Care | Payer: Medicare Other | Attending: Emergency Medicine | Admitting: Emergency Medicine

## 2013-01-10 DIAGNOSIS — R209 Unspecified disturbances of skin sensation: Secondary | ICD-10-CM | POA: Insufficient documentation

## 2013-01-10 DIAGNOSIS — Z87891 Personal history of nicotine dependence: Secondary | ICD-10-CM | POA: Insufficient documentation

## 2013-01-10 DIAGNOSIS — Z85828 Personal history of other malignant neoplasm of skin: Secondary | ICD-10-CM | POA: Insufficient documentation

## 2013-01-10 DIAGNOSIS — Z860100 Personal history of colon polyps, unspecified: Secondary | ICD-10-CM | POA: Insufficient documentation

## 2013-01-10 DIAGNOSIS — Z8669 Personal history of other diseases of the nervous system and sense organs: Secondary | ICD-10-CM | POA: Insufficient documentation

## 2013-01-10 DIAGNOSIS — Z882 Allergy status to sulfonamides status: Secondary | ICD-10-CM | POA: Insufficient documentation

## 2013-01-10 DIAGNOSIS — Z8719 Personal history of other diseases of the digestive system: Secondary | ICD-10-CM | POA: Insufficient documentation

## 2013-01-10 DIAGNOSIS — Z880 Allergy status to penicillin: Secondary | ICD-10-CM | POA: Insufficient documentation

## 2013-01-10 DIAGNOSIS — Z79899 Other long term (current) drug therapy: Secondary | ICD-10-CM | POA: Insufficient documentation

## 2013-01-10 DIAGNOSIS — M899 Disorder of bone, unspecified: Secondary | ICD-10-CM | POA: Insufficient documentation

## 2013-01-10 DIAGNOSIS — K219 Gastro-esophageal reflux disease without esophagitis: Secondary | ICD-10-CM | POA: Insufficient documentation

## 2013-01-10 DIAGNOSIS — E119 Type 2 diabetes mellitus without complications: Secondary | ICD-10-CM | POA: Insufficient documentation

## 2013-01-10 DIAGNOSIS — F4321 Adjustment disorder with depressed mood: Secondary | ICD-10-CM | POA: Insufficient documentation

## 2013-01-10 DIAGNOSIS — I428 Other cardiomyopathies: Secondary | ICD-10-CM | POA: Insufficient documentation

## 2013-01-10 DIAGNOSIS — Z888 Allergy status to other drugs, medicaments and biological substances status: Secondary | ICD-10-CM | POA: Insufficient documentation

## 2013-01-10 DIAGNOSIS — R110 Nausea: Secondary | ICD-10-CM | POA: Insufficient documentation

## 2013-01-10 DIAGNOSIS — E785 Hyperlipidemia, unspecified: Secondary | ICD-10-CM | POA: Insufficient documentation

## 2013-01-10 DIAGNOSIS — Z91040 Latex allergy status: Secondary | ICD-10-CM | POA: Insufficient documentation

## 2013-01-10 DIAGNOSIS — R200 Anesthesia of skin: Secondary | ICD-10-CM

## 2013-01-10 NOTE — ED Provider Notes (Signed)
CSN: [id number]     Arrival date & time 01/10/13  0411 History   None    Chief Complaint  Patient presents with  . Numbness   (Consider location/radiation/quality/duration/timing/severity/associated sxs/prior Treatment) HPI Comments: Patient with hx of cardiomyopathy, migraine, situational anxiety/depression related to recent loss of her husband presents with c/o numbness isolated to the left finger tips starting around 3am this morning. No alleviating nor aggravating components. Reports having some nausea and feeling "funny" at that time, but these have resolved. Symptoms have improved mildly upon arrival to the ED. Denies weakness, visual disturbance, recent illness, chest pain, dyspnea.   The history is provided by the patient.    Past Medical History  Diagnosis Date  . Migraine   . Cardiomyopathy     nonischemic  . GERD (gastroesophageal reflux disease)   . Erosive esophagitis     LA Classification Grade B  . Hyperlipidemia   . Osteopenia   . Hiatal hernia   . Melanoma     basil cell  . Diabetes mellitus     pt states she is not diabetic  . Sessile colonic polyp 10/2011    adenoma   Past Surgical History  Procedure Laterality Date  . Tubal ligation    . Vaginal hysterectomy    . Appendectomy     Family History  Problem Relation Age of Onset  . Heart disease Father   . Alzheimer's disease Mother   . Colon cancer Neg Hx   . Stomach cancer Neg Hx   . Rectal cancer Neg Hx   . Esophageal cancer Neg Hx    History  Substance Use Topics  . Smoking status: Former Smoker    Quit date: 06/03/1981  . Smokeless tobacco: Never Used  . Alcohol Use: 1.8 oz/week    3 Glasses of wine per week     Comment: Wine on occasional   OB History   Grav Para Term Preterm Abortions TAB SAB Ect Mult Living                 Review of Systems  Constitutional: Negative for fever.  HENT: Negative for congestion, rhinorrhea and sore throat.   Eyes: Negative for visual disturbance.   Respiratory: Negative for cough.   Cardiovascular: Negative for chest pain.  Gastrointestinal: Positive for nausea ( resolved).  Genitourinary: Negative for difficulty urinating.  Musculoskeletal: Negative for neck pain and neck stiffness.  Skin: Negative for rash.  Neurological: Negative for dizziness, tremors, facial asymmetry, speech difficulty, weakness, light-headedness and headaches.  Hematological: Negative for adenopathy.  Psychiatric/Behavioral: Negative for agitation.    Allergies  Penicillins; Azithromycin; Lansoprazole; Sulfa antibiotics; Ceclor; Ceftin; Clindamycin/lincomycin; Latex; and Septra  Home Medications   Current Outpatient Rx  Name  Route  Sig  Dispense  Refill  . amLODipine (NORVASC) 2.5 MG tablet   Oral   Take 1 tablet (2.5 mg total) by mouth daily.   30 tablet   2     .Marland KitchenPatient needs to contact office to schedule  App ...   . Calcium Carbonate-Vitamin D (CALCIUM 600 + D PO)   Oral   Take 1 tablet by mouth 2 (two) times daily.         . carvedilol (COREG) 12.5 MG tablet   Oral   Take 1 tablet (12.5 mg total) by mouth 2 (two) times daily with a meal.   180 tablet   3   . clobetasol ointment (TEMOVATE) 0.05 %   Apply externally   Apply  1 application topically as needed.         . enalapril (VASOTEC) 10 MG tablet   Oral   Take 1 tablet (10 mg total) by mouth daily.   90 tablet   3   . escitalopram (LEXAPRO) 20 MG tablet   Oral   Take 1 tablet (20 mg total) by mouth daily.   30 tablet   2   . metroNIDAZOLE (METROGEL) 1 % gel   Topical   Apply 1 application topically daily.          . Multiple Vitamin (MULTIVITAMIN) capsule   Oral   Take 1 capsule by mouth daily.           . nitroGLYCERIN (NITROSTAT) 0.4 MG SL tablet   Sublingual   Place 1 tablet (0.4 mg total) under the tongue every 5 (five) minutes as needed for chest pain.   24 tablet   1   . pantoprazole (PROTONIX) 40 MG tablet   Oral   Take 1 tablet (40 mg total)  by mouth 2 (two) times daily.   180 tablet   2   . promethazine (PHENERGAN) 25 MG tablet   Oral   Take 1 tablet by mouth as needed.         . simvastatin (ZOCOR) 20 MG tablet   Oral   Take 1 tablet (20 mg total) by mouth at bedtime.   90 tablet   3   . zolpidem (AMBIEN CR) 12.5 MG CR tablet   Oral   Take 1 tablet (12.5 mg total) by mouth at bedtime as needed for sleep.   30 tablet   0    BP 131/63  Pulse 71  Temp(Src) 97.4 F (36.3 C) (Oral)  Resp 16  SpO2 98% Physical Exam  Nursing note and vitals reviewed. Constitutional: She is oriented to person, place, and time. She appears well-developed and well-nourished.  HENT:  Head: Normocephalic and atraumatic.  Right Ear: External ear normal.  Left Ear: External ear normal.  Eyes: Conjunctivae and EOM are normal. Pupils are equal, round, and reactive to light.  Neck: Normal range of motion.  Cardiovascular: Normal rate, regular rhythm, normal heart sounds and intact distal pulses.   Pulmonary/Chest: Effort normal and breath sounds normal.  Abdominal: Soft.  Musculoskeletal: Normal range of motion. She exhibits no edema.  Neurological: She is alert and oriented to person, place, and time. She has normal strength and normal reflexes. No cranial nerve deficit or sensory deficit. Coordination and gait normal. GCS eye subscore is 4. GCS verbal subscore is 5. GCS motor subscore is 6.  Subjective difference in sensation to the left volar fingertips. Intact sensation per exam.  Skin: Skin is warm and dry.  Psychiatric: She has a normal mood and affect.    ED Course  Procedures (including critical care time) Labs Review Labs Reviewed - No data to display Imaging Review No results found.  EKG Interpretation   None       MDM   1. Numbness of fingers    Patient with hx of cardiomyopathy, migraine, situational anxiety/depression related to recent loss of her husband presents with c/o numbness isolated to the left  finger tips starting around 3am this morning. Neuro exam reveals no deficits. No weakness noted. Symptoms have improved since arrival. No evidence to suggest TIA/CVA. No chest pain, dyspnea, arm pain/numbness, doubt cardiac event. Given the generalized distribution to all fingers, not entirely c/w carpal tunnel. Consider paresthesia. VSS, BP 120/80 last  check. Felt stable for d/c. Recommend close f/u with PCP. Strict return precautions discussed and provided to the patient.    Simmie Davies, NP 01/10/13 [phone]

## 2013-01-10 NOTE — Telephone Encounter (Signed)
[facility] Triage Call Report Triage Record Num: [id number] Operator: Edgar Frisk Patient Name: [name] Call Date & Time: 01/10/2013 3:36:26AM Patient Phone: [phone] PCP: Ruthe Mannan Patient Gender: Female PCP Fax : [phone] Patient DOB: [birth year] Practice Name[facility] Reason for Call: Caller: Aminat/Patient; PCP: Ruthe Mannan (Family Practice); CB#: [phone]; Call regarding Left hand numbness; Pt reports woke this am with numbess of left hand. States Numbness continues, with tingling sensation. No other areas of numbness Neurological Deficits Guideline- Neg responses til - Localized numbness Disposition- See Provider within 4 hours - To ED for evaluation now Protocol(s) Used: Neurological Deficits Recommended Outcome per Protocol: See Provider within 4 hours Reason for Outcome: Localized numbness/tingling, weakness or pain that persists for 4 or more hours or recurs and NOT previously evaluated Care Advice: ~ DO NOT drive until condition evaluated. ~ Limit activities and increase periods of rest. ~ Call provider if symptoms worsen or new symptoms develop. 01/10/2013 3:49:55AM Page 1 of 1 [email or web address]

## 2013-01-10 NOTE — ED Provider Notes (Signed)
Medical screening examination/treatment/procedure(s) were conducted as a shared visit with non-physician practitioner(s) and myself.  I personally evaluated the patient during the encounter.  Patient with improving numbness of distal aspect of fingers on left (non-dominant) hand. Awoke from sleep with sx. Normal neurologic exam. No signs of vascular compromise, normal neurologic exam. Suspect neuropraxia. The patient is stable for d/c with plan to f/u with PCP.    Brandt Loosen, MD 01/10/13 [phone]

## 2013-01-10 NOTE — Progress Notes (Signed)
D/C instructions and scripts given. Pt verbalized understanding of instructions and pts grandmother is here to take pt home.

## 2013-01-10 NOTE — Telephone Encounter (Signed)
ED eval appropriate 

## 2013-01-10 NOTE — ED Notes (Addendum)
Pt states she wasn't sleeping well last last night and that she woke up around 245 am and her fingertips felt numb, she felt a weird sensation in her head, and states that something didn't feel quite right. Pt states that she thought maybe her hand was asleep and waited about 45 minutes before calling [place] PCP who asked her to come to the hospital.

## 2013-01-11 ENCOUNTER — Telehealth: Payer: Self-pay | Admitting: Internal Medicine

## 2013-01-11 NOTE — Telephone Encounter (Signed)
New problem:  Pt states she was recently seen in the ER regarding numbness in her hand. Pt would like to be advised be Dr. Tenny Craw or her nurse on what she should do.

## 2013-01-11 NOTE — Telephone Encounter (Signed)
Spoke with [name], she is very anxious about what happened yesterday. She woke with her left hand numb, it did not go away. She called her medical md and they directed her to the ER. She was there for 4 hours and when left her fingertips were still numb. Today she cont to have the numbness of the left fingertips. She has an appt with the PA at the PCP office Thursday this week. She wants to know if dr Tenny Craw thinks she needs to see neurology. Will forward for dr Tenny Craw review.

## 2013-01-12 ENCOUNTER — Encounter: Payer: Self-pay | Admitting: Radiology

## 2013-01-12 ENCOUNTER — Ambulatory Visit (INDEPENDENT_AMBULATORY_CARE_PROVIDER_SITE_OTHER): Payer: Medicare Other | Admitting: Cardiovascular Disease

## 2013-01-12 ENCOUNTER — Encounter: Payer: Self-pay | Admitting: Cardiovascular Disease

## 2013-01-12 VITALS — BP 142/78 | HR 81 | Resp 16

## 2013-01-12 DIAGNOSIS — F3289 Other specified depressive episodes: Secondary | ICD-10-CM

## 2013-01-12 DIAGNOSIS — I428 Other cardiomyopathies: Secondary | ICD-10-CM

## 2013-01-12 DIAGNOSIS — R079 Chest pain, unspecified: Secondary | ICD-10-CM

## 2013-01-12 DIAGNOSIS — E7800 Pure hypercholesterolemia, unspecified: Secondary | ICD-10-CM

## 2013-01-12 DIAGNOSIS — F329 Major depressive disorder, single episode, unspecified: Secondary | ICD-10-CM

## 2013-01-12 NOTE — Assessment & Plan Note (Signed)
Resolved Most recent EF by myovue normal 1/14  Consider strain imaging with next echo as she still has LBBB

## 2013-01-12 NOTE — Assessment & Plan Note (Signed)
Atypical Normal myovue less than a year ago ECG not interpretable due to LBBB No need for w/u Reassurance given F/U with Dr Tenny Craw in December

## 2013-01-12 NOTE — Assessment & Plan Note (Signed)
Continue lexapro. Contributing to ER visit and symptoms

## 2013-01-12 NOTE — Patient Instructions (Signed)
Your physician recommends that you schedule a follow-up appointment in: KEEP  APPT  WITH DR ROSS Your physician recommends that you continue on your current medications as directed. Please refer to the Current Medication list given to you today.

## 2013-01-12 NOTE — Progress Notes (Signed)
Patient ID: [name], female   DOB: [birth year], 67 y.o.   MRN: [id number] 67 yo with history of nonischemic DCM.  Recent myovue 1/14 non ischemic.  Seen in office today with atypical chest pain.  Pain in upper back and shoulders. Mild positional component.  No pleurisy.  She is a bit anxious. Lost husband 6 months ago and is still depressed about this. He fell off a latter and hit his head and the visual still bothers her.  Seen in ER 11/3 with numbness in right arm  W/u negative for stroke.  Baseline ECG is LBBB which is unchanged today.  Compliant with meds.  No dyspnea, palpitations or syncope Lexapro started a couple of months ago.  Not clear how helpful it has been    ROS: Denies fever, malais, weight loss, blurry vision, decreased visual acuity, cough, sputum, SOB, hemoptysis, pleuritic pain, palpitaitons, heartburn, abdominal pain, melena, lower extremity edema, claudication, or rash.  All other systems reviewed and negative  General: Affect appropriate Healthy:  appears stated age HEENT: normal Neck supple with no adenopathy JVP normal no bruits no thyromegaly Lungs clear with no wheezing and good diaphragmatic motion Heart:  S1/S2 no murmur, no rub, gallop or click PMI normal Abdomen: benighn, BS positve, no tenderness, no AAA no bruit.  No HSM or HJR Distal pulses intact with no bruits No edema Neuro non-focal Skin warm and dry No muscular weakness   Current Outpatient Prescriptions  Medication Sig Dispense Refill  . amLODipine (NORVASC) 2.5 MG tablet Take 1 tablet (2.5 mg total) by mouth daily.  30 tablet  2  . Calcium Carbonate-Vitamin D (CALCIUM 600 + D PO) Take 1 tablet by mouth 2 (two) times daily.      . carvedilol (COREG) 12.5 MG tablet Take 1 tablet (12.5 mg total) by mouth 2 (two) times daily with a meal.  180 tablet  3  . clobetasol ointment (TEMOVATE) 0.05 % Apply 1 application topically as needed.      . enalapril (VASOTEC) 10 MG tablet Take 1 tablet (10 mg  total) by mouth daily.  90 tablet  3  . escitalopram (LEXAPRO) 20 MG tablet Take 1 tablet (20 mg total) by mouth daily.  30 tablet  2  . metroNIDAZOLE (METROGEL) 1 % gel Apply 1 application topically daily.       . Multiple Vitamin (MULTIVITAMIN) capsule Take 1 capsule by mouth daily.        . nitroGLYCERIN (NITROSTAT) 0.4 MG SL tablet Place 1 tablet (0.4 mg total) under the tongue every 5 (five) minutes as needed for chest pain.  24 tablet  1  . pantoprazole (PROTONIX) 40 MG tablet Take 1 tablet (40 mg total) by mouth 2 (two) times daily.  180 tablet  2  . promethazine (PHENERGAN) 25 MG tablet Take 1 tablet by mouth as needed.      . simvastatin (ZOCOR) 20 MG tablet Take 1 tablet (20 mg total) by mouth at bedtime.  90 tablet  3  . zolpidem (AMBIEN CR) 12.5 MG CR tablet Take 1 tablet (12.5 mg total) by mouth at bedtime as needed for sleep.  30 tablet  0   No current facility-administered medications for this visit.    Allergies  Penicillins; Azithromycin; Lansoprazole; Sulfa antibiotics; Ceclor; Ceftin; Clindamycin/lincomycin; Latex; and Septra  Electrocardiogram:  SR LBBB rate 81    Assessment and Plan

## 2013-01-12 NOTE — Assessment & Plan Note (Signed)
Cholesterol is at goal.  Continue current dose of statin and diet Rx.  No myalgias or side effects.  F/U  LFT's in 6 months. Lab Results  Component Value Date   LDLCALC See Comment mg/dL 1/61/0960

## 2013-01-13 ENCOUNTER — Ambulatory Visit (INDEPENDENT_AMBULATORY_CARE_PROVIDER_SITE_OTHER): Payer: Medicare Other | Admitting: Internal Medicine

## 2013-01-13 ENCOUNTER — Encounter: Payer: Self-pay | Admitting: Internal Medicine

## 2013-01-13 ENCOUNTER — Ambulatory Visit: Payer: Medicare Other | Admitting: Internal Medicine

## 2013-01-13 VITALS — BP 110/70 | HR 82 | Temp 97.5°F | Wt 125.0 lb

## 2013-01-13 DIAGNOSIS — R202 Paresthesia of skin: Secondary | ICD-10-CM

## 2013-01-13 DIAGNOSIS — R209 Unspecified disturbances of skin sensation: Secondary | ICD-10-CM

## 2013-01-13 LAB — VITAMIN B12: Vitamin B-12: 380 pg/mL (ref 211–911)

## 2013-01-13 NOTE — Progress Notes (Signed)
Subjective:    Patient ID: [name], female    DOB: 01-19-1946, 67 y.o.   MRN: [id number]  HPI  Pt presents to the clinic today for hospital followup. She went to the ER on 11/3 with c/o numbness and tingling in her left hand. This has started at 2 am. She reports it felt like it fell asleep but the feeling would not go away. It lasted > 8 hours before finally resolving. The ER told her it was a nerve related issue and that she needed to followup with her PCP. 2 days later, she was driving down the road and started to have chest pain. She drove over to her cardiologist office. They did an EKG which was normal and reassured her that her chest pain was not cardiac in nature-likely anxiety. The chest pain resolved. Her main concern today is that she wants to make sure she is not having a stroke or heart attack. The numbness and tingling only occurs in the left fingers. It does come and go and is not nearly as bad as it was the morning she went to the ER. She has never had numbness and tingling before.  Review of Systems      Past Medical History  Diagnosis Date  . Migraine   . Cardiomyopathy     nonischemic  . GERD (gastroesophageal reflux disease)   . Erosive esophagitis     LA Classification Grade B  . Hyperlipidemia   . Osteopenia   . Hiatal hernia   . Melanoma     basil cell  . Diabetes mellitus     pt states she is not diabetic  . Sessile colonic polyp 10/2011    adenoma    Current Outpatient Prescriptions  Medication Sig Dispense Refill  . amLODipine (NORVASC) 2.5 MG tablet Take 1 tablet (2.5 mg total) by mouth daily.  30 tablet  2  . Calcium Carbonate-Vitamin D (CALCIUM 600 + D PO) Take 1 tablet by mouth 2 (two) times daily.      . carvedilol (COREG) 12.5 MG tablet Take 1 tablet (12.5 mg total) by mouth 2 (two) times daily with a meal.  180 tablet  3  . enalapril (VASOTEC) 10 MG tablet Take 1 tablet (10 mg total) by mouth daily.  90 tablet  3  . escitalopram (LEXAPRO) 20  MG tablet Take 1 tablet (20 mg total) by mouth daily.  30 tablet  2  . Multiple Vitamin (MULTIVITAMIN) capsule Take 1 capsule by mouth daily.        . nitroGLYCERIN (NITROSTAT) 0.4 MG SL tablet Place 1 tablet (0.4 mg total) under the tongue every 5 (five) minutes as needed for chest pain.  24 tablet  1  . pantoprazole (PROTONIX) 40 MG tablet Take 1 tablet (40 mg total) by mouth 2 (two) times daily.  180 tablet  2  . promethazine (PHENERGAN) 25 MG tablet Take 1 tablet by mouth as needed.      . simvastatin (ZOCOR) 20 MG tablet Take 1 tablet (20 mg total) by mouth at bedtime.  90 tablet  3  . zolpidem (AMBIEN CR) 12.5 MG CR tablet Take 1 tablet (12.5 mg total) by mouth at bedtime as needed for sleep.  30 tablet  0   No current facility-administered medications for this visit.    Allergies  Allergen Reactions  . Penicillins Hives  . Azithromycin Other (See Comments)    Pt denies-states she does take a z pack  . Lansoprazole  REACTION: headaches  . Sulfa Antibiotics Hives  . Ceclor [Cefaclor] Itching and Rash  . Ceftin [Cefuroxime Axetil] Rash  . Clindamycin/Lincomycin Itching and Rash  . Latex Itching and Rash  . Septra [Sulfamethoxazole-Trimethoprim] Rash    Family History  Problem Relation Age of Onset  . Heart disease Father   . Alzheimer's disease Mother   . Colon cancer Neg Hx   . Stomach cancer Neg Hx   . Rectal cancer Neg Hx   . Esophageal cancer Neg Hx     History   Social History  . Marital Status: Married    Spouse Name: N/A    Number of Children: 3  . Years of Education: N/A   Occupational History  . Housewife    Social History Main Topics  . Smoking status: Former Smoker    Quit date: 06/03/1981  . Smokeless tobacco: Never Used  . Alcohol Use: 1.8 oz/week    3 Glasses of wine per week     Comment: Wine on occasional  . Drug Use: No  . Sexual Activity: Not on file   Other Topics Concern  . Not on file   Social History Narrative  . No narrative  on file     Constitutional: Denies fever, malaise, fatigue, headache or abrupt weight changes.  Musculoskeletal: Denies decrease in range of motion, difficulty with gait, muscle pain or joint pain and swelling.  Skin: Denies redness, rashes, lesions or ulcercations.  Neurological: Denies dizziness, difficulty with memory, difficulty with speech or problems with balance and coordination.   No other specific complaints in a complete review of systems (except as listed in HPI above).  Objective:   Physical Exam  BP 110/70  Pulse 82  Temp(Src) 97.5 F (36.4 C) (Tympanic)  Wt 125 lb (56.7 kg)  SpO2 98% Wt Readings from Last 3 Encounters:  01/13/13 125 lb (56.7 kg)  01/05/13 125 lb 8 oz (56.926 kg)  11/16/12 129 lb (58.514 kg)    General: Appears her stated age, very anxious but well developed, well nourished in NAD. Skin: Warm, dry and intact. No rashes, lesions or ulcerations noted. Cardiovascular: Normal rate and rhythm. S1,S2 noted.  No murmur, rubs or gallops noted. No JVD or BLE edema. No carotid bruits noted. Radial pulses 2+ bilaterally. Pulmonary/Chest: Normal effort and positive vesicular breath sounds. No respiratory distress. No wheezes, rales or ronchi noted.  Musculoskeletal: Normal range of motion. No signs of joint swelling. No difficulty with gait. Strength 5/5 BUE. Neurological: Alert and oriented. Cranial nerves II-XII intact. Coordination normal. +DTRs bilaterally. Negative Tinels, negative Phalens.   BMET    Component Value Date/Time   NA 139 02/19/2012 1023   K 3.7 02/19/2012 1023   CL 107 02/19/2012 1023   CO2 27 02/19/2012 1023   GLUCOSE 111* 02/19/2012 1023   BUN 16 02/19/2012 1023   CREATININE 0.6 02/19/2012 1023   CALCIUM 9.4 02/19/2012 1023   GFRNONAA 110.83 10/11/2009 1058   GFRAA 130 11/22/2007 1514    Lipid Panel     Component Value Date/Time   CHOL 180 04/03/2011 1009   TRIG 224.0* 04/03/2011 1009   HDL 57.80 04/03/2011 1009   CHOLHDL 3  04/03/2011 1009   VLDL 44.8* 04/03/2011 1009   LDLCALC See Comment mg/dL 1/61/0960 4540    CBC    Component Value Date/Time   WBC 4.8 03/11/2012 1341   RBC 4.39 03/11/2012 1341   HGB 12.3 03/11/2012 1341   HCT 38.7 03/11/2012 1341   PLT  240 03/11/2012 1341   MCV 88.2 03/11/2012 1341   MCH 28.0 03/11/2012 1341   MCHC 31.8 03/11/2012 1341   RDW 12.3 03/11/2012 1341   LYMPHSABS 1.4 03/11/2012 1341   MONOABS 0.4 03/11/2012 1341   EOSABS 0.2 03/11/2012 1341   BASOSABS 0.0 03/11/2012 1341    Hgb A1C Lab Results  Component Value Date   HGBA1C 5.6 07/09/2009         Assessment & Plan:   Paresthesia of left hand, new onset:  Reassurance given that I do not think she is having a stroke. Exam non revealing for carpal tunnel Could be some other nerve related issue Her chart says she is diabetic but no A1C to review Will check A1C and B12 today- if normal will refer to neurology for NCS per pt request  Hospital notes, cardiology notes, ECG reviewed. Data to be reviewed approx 15 minutes Will follow up after labs are back, RTC as needed or if worse

## 2013-01-13 NOTE — Patient Instructions (Signed)
Paresthesia °Paresthesia is an abnormal burning or prickling sensation. This sensation is generally felt in the hands, arms, legs, or feet. However, it may occur in any part of the body. It is usually not painful. The feeling may be described as: °· Tingling or numbness. °· "Pins and needles." °· Skin crawling. °· Buzzing. °· Limbs "falling asleep." °· Itching. °Most people experience temporary (transient) paresthesia at some time in their lives. °CAUSES  °Paresthesia may occur when you breathe too quickly (hyperventilation). It can also occur without any apparent cause. Commonly, paresthesia occurs when pressure is placed on a nerve. The feeling quickly goes away once the pressure is removed. For some people, however, paresthesia is a long-lasting (chronic) condition caused by an underlying disorder. The underlying disorder may be: °· A traumatic, direct injury to nerves. Examples include a: °· Broken (fractured) neck. °· Fractured skull. °· A disorder affecting the brain and spinal cord (central nervous system). Examples include: °· Transverse myelitis. °· Encephalitis. °· Transient ischemic attack. °· Multiple sclerosis. °· Stroke. °· Tumor or blood vessel problems, such as an arteriovenous malformation pressing against the brain or spinal cord. °· A condition that damages the peripheral nerves (peripheral neuropathy). Peripheral nerves are not part of the brain and spinal cord. These conditions include: °· Diabetes. °· Peripheral vascular disease. °· Nerve entrapment syndromes, such as carpal tunnel syndrome. °· Shingles. °· Hypothyroidism. °· Vitamin B12 deficiencies. °· Alcoholism. °· Heavy metal poisoning (lead, arsenic). °· Rheumatoid arthritis. °· Systemic lupus erythematosus. °DIAGNOSIS  °Your caregiver will attempt to find the underlying cause of your paresthesia. Your caregiver may: °· Take your medical history. °· Perform a physical exam. °· Order various lab tests. °· Order imaging tests. °TREATMENT    °Treatment for paresthesia depends on the underlying cause. °HOME CARE INSTRUCTIONS °· Avoid drinking alcohol. °· You may consider massage or acupuncture to help relieve your symptoms. °· Keep all follow-up appointments as directed by your caregiver. °SEEK IMMEDIATE MEDICAL CARE IF:  °· You feel weak. °· You have trouble walking or moving. °· You have problems with speech or vision. °· You feel confused. °· You cannot control your bladder or bowel movements. °· You feel numbness after an injury. °· You faint. °· Your burning or prickling feeling gets worse when walking. °· You have pain, cramps, or dizziness. °· You develop a rash. °MAKE SURE YOU: °· Understand these instructions. °· Will watch your condition. °· Will get help right away if you are not doing well or get worse. °Document Released: 02/14/2002 Document Revised: 05/19/2011 Document Reviewed: 11/15/2010 °[employer]® Patient Information ©2014 [employer]. ° °

## 2013-01-14 ENCOUNTER — Ambulatory Visit: Payer: Medicare Other | Admitting: Internal Medicine

## 2013-01-17 ENCOUNTER — Telehealth: Payer: Self-pay | Admitting: Internal Medicine

## 2013-01-17 ENCOUNTER — Other Ambulatory Visit: Payer: Self-pay | Admitting: Internal Medicine

## 2013-01-17 DIAGNOSIS — R202 Paresthesia of skin: Secondary | ICD-10-CM

## 2013-01-17 NOTE — Telephone Encounter (Signed)
If she has more numbness I would refer to [facility] neuro

## 2013-01-17 NOTE — Telephone Encounter (Signed)
Spoke with pt, she feels so much better today. She will let us know if she wants to do anything else. willmake dr Tenny Craw aware

## 2013-01-17 NOTE — Telephone Encounter (Signed)
New message     Want Dr Tenny Craw to know she is feeling much better.  She want to wait and see what happens----she is on the mends.

## 2013-01-18 ENCOUNTER — Encounter: Payer: Self-pay | Admitting: Cardiovascular Disease

## 2013-01-21 NOTE — Telephone Encounter (Signed)
See telephone note from 01-17-13

## 2013-01-24 ENCOUNTER — Other Ambulatory Visit: Payer: Self-pay | Admitting: *Deleted

## 2013-01-24 NOTE — Telephone Encounter (Signed)
Faxed refill request. Please advise.  

## 2013-01-25 MED ORDER — ZOLPIDEM TARTRATE ER 12.5 MG PO TBCR: 12.5000 mg | EXTENDED_RELEASE_TABLET | Freq: Every evening | ORAL | Status: DC | PRN

## 2013-01-25 NOTE — Telephone Encounter (Signed)
Rx called to pharmacy

## 2013-01-25 NOTE — Telephone Encounter (Signed)
Ok to phone in.

## 2013-02-10 ENCOUNTER — Ambulatory Visit (INDEPENDENT_AMBULATORY_CARE_PROVIDER_SITE_OTHER): Payer: Medicare Other | Admitting: Neurology

## 2013-02-10 ENCOUNTER — Encounter: Payer: Self-pay | Admitting: Neurology

## 2013-02-10 VITALS — BP 140/78 | HR 60 | Temp 98.1°F | Resp 12 | Ht 62.0 in | Wt 125.6 lb

## 2013-02-10 DIAGNOSIS — R209 Unspecified disturbances of skin sensation: Secondary | ICD-10-CM

## 2013-02-10 DIAGNOSIS — R202 Paresthesia of skin: Secondary | ICD-10-CM

## 2013-02-10 DIAGNOSIS — R42 Dizziness and giddiness: Secondary | ICD-10-CM

## 2013-02-10 DIAGNOSIS — R29818 Other symptoms and signs involving the nervous system: Secondary | ICD-10-CM

## 2013-02-10 NOTE — Progress Notes (Signed)
[facility] Clinic Note - Initial Visit   Date: 02/10/2013    [name] MRN: [id number] DOB: [birth year]   Dear Dr Dayton Martes:  Thank you for your kind referral of [name] for consultation of left hand numbness. Although her history is well known to you, please allow [place] to reiterate it for the purpose of our medical record. The patient was accompanied to the clinic by self.   History of Present Illness: [name] is a 67 y.o. year-old right-handed Caucasian female with history of nonischemic cardiomyopathy, migraines, GERD, hyperlipidemia, and anxiety presenting for evaluation of left hand numbness.    Around the first week of November, she woke up in the middle of the night with numbness of the left hand.  Symptoms persisted for 45-minutes and she was concerned about a stroke, so she called the nurse on call who recommended that she go to the emergency department.  They recommended that she follow-up with a neurologist. Since then, she continues to have intermittent spells of numbness involving the palmer surface of her hand occuring 2-4 times per day, lasting ~51minutes.  She has associated tingling, but denies any neck pain.  No exacerbating or alleviating factors.  She had one episode last year where she lost her vision from both eyes while reading the paper, lasting brief seconds.  She fell backwards while on the bathmat and hit her head.  Further, she complains of intermittent dizzy spells.  Of note, her husband (pediatrician) passed away unexpectedly about 55-months ago.  He was on a ladder and she witnessed him have a traumatic fall.  Understandably, she reports being under a great deal of stress related to new adjustments.  No personal or family history of TIAs/stroke.    Out-side paper records, electronic medical record, and images have been reviewed where available and summarized as:  Component     Latest Ref Rng 03/11/2012 01/13/2013  CK  Total     7 - 177 U/L 58   CK, MB     0.3 - 4.0 ng/mL 2.3   Relative Index     0.0 - 2.5 NOT VALID   Hemoglobin A1C     4.6 - 6.5 %  5.6  Vitamin B-12     211 - 911 pg/mL  380   Lab Results  Component Value Date   TSH 2.09 03/27/2011     Past Medical History  Diagnosis Date  . Migraine   . Cardiomyopathy     nonischemic  . GERD (gastroesophageal reflux disease)   . Erosive esophagitis     LA Classification Grade B  . Hyperlipidemia   . Osteopenia   . Hiatal hernia   . Melanoma     basil cell  . Diabetes mellitus     pt states she is not diabetic  . Sessile colonic polyp 10/2011    adenoma    Past Surgical History  Procedure Laterality Date  . Tubal ligation    . Vaginal hysterectomy    . Appendectomy       Medications:  Current Outpatient Prescriptions on File Prior to Visit  Medication Sig Dispense Refill  . amLODipine (NORVASC) 2.5 MG tablet Take 1 tablet (2.5 mg total) by mouth daily.  30 tablet  2  . Calcium Carbonate-Vitamin D (CALCIUM 600 + D PO) Take 1 tablet by mouth 2 (two) times daily.      . carvedilol (COREG) 12.5 MG tablet Take 1 tablet (12.5 mg total)  by mouth 2 (two) times daily with a meal.  180 tablet  3  . enalapril (VASOTEC) 10 MG tablet Take 1 tablet (10 mg total) by mouth daily.  90 tablet  3  . escitalopram (LEXAPRO) 20 MG tablet Take 1 tablet (20 mg total) by mouth daily.  30 tablet  2  . Multiple Vitamin (MULTIVITAMIN) capsule Take 1 capsule by mouth daily.        . nitroGLYCERIN (NITROSTAT) 0.4 MG SL tablet Place 1 tablet (0.4 mg total) under the tongue every 5 (five) minutes as needed for chest pain.  24 tablet  1  . pantoprazole (PROTONIX) 40 MG tablet Take 1 tablet (40 mg total) by mouth 2 (two) times daily.  180 tablet  2  . promethazine (PHENERGAN) 25 MG tablet Take 1 tablet by mouth as needed.      . simvastatin (ZOCOR) 20 MG tablet Take 1 tablet (20 mg total) by mouth at bedtime.  90 tablet  3  . zolpidem (AMBIEN CR) 12.5 MG CR  tablet Take 1 tablet (12.5 mg total) by mouth at bedtime as needed for sleep.  30 tablet  0   No current facility-administered medications on file prior to visit.    Allergies:  Allergies  Allergen Reactions  . Penicillins Hives  . Azithromycin Other (See Comments)    Pt denies-states she does take a z pack  . Lansoprazole     REACTION: headaches  . Sulfa Antibiotics Hives  . Ceclor [Cefaclor] Itching and Rash  . Ceftin [Cefuroxime Axetil] Rash  . Clindamycin/Lincomycin Itching and Rash  . Latex Itching and Rash  . Septra [Sulfamethoxazole-Trimethoprim] Rash    Family History: Family History  Problem Relation Age of Onset  . Heart disease Father   . Alzheimer's disease Mother   . Colon cancer Neg Hx   . Stomach cancer Neg Hx   . Rectal cancer Neg Hx   . Esophageal cancer Neg Hx   . Arthritis/Rheumatoid Sister     Social History: History   Social History  . Marital Status: Married    Spouse Name: N/A    Number of Children: 3  . Years of Education: N/A   Occupational History  . Housewife    Social History Main Topics  . Smoking status: Former Smoker    Quit date: 06/03/1981  . Smokeless tobacco: Never Used  . Alcohol Use: 1.8 oz/week    3 Glasses of wine per week     Comment: Wine on occasional  . Drug Use: No  . Sexual Activity: Not on file   Other Topics Concern  . Not on file   Social History Narrative   Lives alone in two-story home.     Her husband recently passed away unexpectedly.  They have three children.    Review of Systems:  CONSTITUTIONAL: No fevers, chills, night sweats, + weight loss (15-20lb).   EYES: No visual changes or eye pain ENT: No hearing changes.  No history of nose bleeds.   RESPIRATORY: No cough, wheezing and shortness of breath.   CARDIOVASCULAR: Negative for chest pain, and palpitations.   GI: Negative for abdominal discomfort, blood in stools or black stools.  No recent change in bowel habits.   GU:  No history of  incontinence.   MUSCLOSKELETAL: No history of joint pain or swelling.   SKIN: Negative for lesions, rash, and itching.   HEMATOLOGY/ONCOLOGY: Negative for prolonged bleeding, bruising easily, and swollen nodes.  No history of cancer.  ENDOCRINE: Negative for cold or heat intolerance, polydipsia or goiter.   PSYCH:  + depression or anxiety symptoms.   NEURO: As Above.   Vital Signs:  BP 140/78  Pulse 60  Temp(Src) 98.1 F (36.7 C)  Resp 12  Ht 5\' 2"  (1.575 m)  Wt 125 lb 9.6 oz (56.972 kg)  BMI 22.97 kg/m2   General Medical Exam:   General:  Well appearing, comfortable.   Eyes/ENT: see cranial nerve examination.   Neck: No masses appreciated.  Full range of motion without tenderness.  No carotid bruits. Respiratory:  Clear to auscultation, good air entry bilaterally.   Cardiac:  Regular rate and rhythm, no murmur.    Back:  No pain to palpation of spinous processes.   Extremities:  No deformities, edema, or skin discoloration. Good capillary refill.   Skin:  Skin color, texture, turgor normal. No rashes or lesions.  Neurological Exam: MENTAL STATUS including orientation to time, place, person, recent and remote memory, attention span and concentration, language, and fund of knowledge is normal.  Speech is not dysarthric.  CRANIAL NERVES: II:  No visual field defects.  Unremarkable fundi.   III-IV-VI: Pupils equal round and reactive to light.  Normal conjugate, extra-ocular eye movements in all directions of gaze.  No nystagmus.  No ptosis.   V:  Normal facial sensation.   VII:  Normal facial symmetry and movements.  No pathologic facial reflexes.  VIII:  Normal hearing and vestibular function.   IX-X:  Normal palatal movement.   XI:  Normal shoulder shrug and head rotation.   XII:  Normal tongue strength and range of motion, no deviation or fasciculation.  MOTOR:  No atrophy, fasciculations or abnormal movements.  No pronator drift.  Tone is normal.    Right Upper  Extremity:    Left Upper Extremity:    Deltoid  5/5   Deltoid  5/5   Biceps  5/5   Biceps  5/5   Triceps  5/5   Triceps  5/5   Wrist extensors  5/5   Wrist extensors  5/5   Wrist flexors  5/5   Wrist flexors  5/5   Finger extensors  5/5   Finger extensors  5/5   Finger flexors  5/5   Finger flexors  5/5   Dorsal interossei  5/5   Dorsal interossei  5/5   Abductor pollicis  5/5   Abductor pollicis  5/5   Tone (Ashworth scale)  0  Tone (Ashworth scale)  0   Right Lower Extremity:    Left Lower Extremity:    Hip flexors  5/5   Hip flexors  5/5   Hip extensors  5/5   Hip extensors  5/5   Knee flexors  5/5   Knee flexors  5/5   Knee extensors  5/5   Knee extensors  5/5   Dorsiflexors  5/5   Dorsiflexors  5/5   Plantarflexors  5/5   Plantarflexors  5/5   Toe extensors  5/5   Toe extensors  5/5   Toe flexors  5/5   Toe flexors  5/5   Tone (Ashworth scale)  0  Tone (Ashworth scale)  0   MSRs:  Right  Left brachioradialis 2+  brachioradialis 2+  biceps 2+  biceps 2+  triceps 2+  triceps 2+  patellar 3+  patellar 3+  ankle jerk 2+  ankle jerk 2+  Hoffman no  Hoffman no  plantar response down  plantar response down   SENSORY:  Normal and symmetric perception of light touch, pinprick, vibration, and proprioception.  Romberg's sign absent.   COORDINATION/GAIT: Normal finger-to- nose-finger and heel-to-shin.  Intact rapid alternating movements bilaterally.  Able to rise from a chair without using arms.  Gait narrow based and stable. Tandem and stressed gait intact.   Other tests:  Tinel's sign at the left carpal tunnel, Phalen's test, and Prayer test is negative.   IMPRESSION: [name] is a 67 year-old female presenting for evaluation of left hand paresthesias.  Her neurological examination reveals brisk patella jerks, and otherwise normal motor strength and sensation.  Based on her history and exam, I suspect that she has  mild carpal tunnel syndrome, and less likely cervical radiculopathy (no radicular symptoms) or TIA/stroke.  An EMG would better localize her symptoms, but it may be too early to perform for motor units abnormalities to be present.  At this time, I would like to manage her conservatively for CTS and have recommended a wrist splint.  She also has other transient neurological symptoms over the past year (vision loss, dizziness) for which I would like to obtain US carotids.  If there is significant narrowing, I will pursue brain imaging and additional work-up as needed.    PLAN/RECOMMENDATIONS:  1.  Ultrasound carotids 2.  Use wrist splint at bedtime to left hand and avoid hyperflexion of the wrist  3.  Consider EMG going forward if symptoms are not improving 4.  Return to clinic in 19-months, or sooner as needed  The duration of this appointment visit was 50 minutes of face-to-face time with the patient.  Greater than 50% of this time was spent in counseling, explanation of diagnosis, planning of further management, and coordination of care.   Thank you for allowing me to participate in patient's care.  If I can answer any additional questions, I would be pleased to do so.    Sincerely,    Adira Limburg K. Allena Katz, DO

## 2013-02-10 NOTE — Patient Instructions (Addendum)
1.  Ultrasound carotids is scheduled for Monday December 8th at 10:15am. [facility] [phone] 2.  Use wrist splint at bedtime to left hand and avoid hyperflexion of the wrist  3.  Consider EMG going forward if symptoms are not improving 4.  Return to clinic in 37-months, or sooner as needed

## 2013-02-11 ENCOUNTER — Ambulatory Visit (INDEPENDENT_AMBULATORY_CARE_PROVIDER_SITE_OTHER): Payer: Medicare Other | Admitting: Internal Medicine

## 2013-02-11 ENCOUNTER — Encounter (INDEPENDENT_AMBULATORY_CARE_PROVIDER_SITE_OTHER): Payer: Self-pay

## 2013-02-11 VITALS — BP 132/66 | HR 64 | Ht 62.0 in | Wt 125.0 lb

## 2013-02-11 DIAGNOSIS — E7800 Pure hypercholesterolemia, unspecified: Secondary | ICD-10-CM

## 2013-02-11 DIAGNOSIS — R209 Unspecified disturbances of skin sensation: Secondary | ICD-10-CM

## 2013-02-11 DIAGNOSIS — I509 Heart failure, unspecified: Secondary | ICD-10-CM

## 2013-02-11 DIAGNOSIS — R200 Anesthesia of skin: Secondary | ICD-10-CM

## 2013-02-11 DIAGNOSIS — I428 Other cardiomyopathies: Secondary | ICD-10-CM

## 2013-02-11 MED ORDER — ENALAPRIL MALEATE 10 MG PO TABS: 10.0000 mg | ORAL_TABLET | Freq: Every day | ORAL | Status: DC

## 2013-02-11 MED ORDER — SIMVASTATIN 20 MG PO TABS: 20.0000 mg | ORAL_TABLET | Freq: Every day | ORAL | Status: DC

## 2013-02-11 MED ORDER — AMLODIPINE BESYLATE 2.5 MG PO TABS: 2.5000 mg | ORAL_TABLET | Freq: Every day | ORAL | Status: DC

## 2013-02-11 MED ORDER — CARVEDILOL 12.5 MG PO TABS: 12.5000 mg | ORAL_TABLET | Freq: Two times a day (BID) | ORAL | Status: DC

## 2013-02-11 NOTE — Progress Notes (Signed)
Patient ID: [name], female   DOB: Apr 25, 1945, 67 y.o.   MRN: [id number] Patient is a 67 yo with a history of LBBB, NICM (normalization of LVEF)  Also a history of CP (normal myoview in Jan 2014) and significant GERD SInce I saw her in clinic last her husband has died.  The accident very traumatic as she witnessed all of event. From a cardiac standpoint she has done well  She is very active  Denies CP  Breathing is OK   Denies CP  No palpitations.   Weight is down but she says she is eating well  Just watching portions more.     PE BP 120/60   P 70   Patient is in NAD HEENT: normal Neck JVP normal no bruits no thyromegaly Lungs CTA Heart:  S1/S2 no murmur, no rub, gallop or click PMI normal Abdomen: benign, BS positve, no hepatomegaly Distal pulses intact with no bruits No edema Neuro non-focal Skin warm and dry   Current Outpatient Prescriptions  Medication Sig Dispense Refill  . amLODipine (NORVASC) 2.5 MG tablet Take 1 tablet (2.5 mg total) by mouth daily.  30 tablet  2  . Calcium Carbonate-Vitamin D (CALCIUM 600 + D PO) Take 1 tablet by mouth 2 (two) times daily.      . carvedilol (COREG) 12.5 MG tablet Take 1 tablet (12.5 mg total) by mouth 2 (two) times daily with a meal.  180 tablet  3  . enalapril (VASOTEC) 10 MG tablet Take 1 tablet (10 mg total) by mouth daily.  90 tablet  3  . escitalopram (LEXAPRO) 20 MG tablet Take 1 tablet (20 mg total) by mouth daily.  30 tablet  2  . Multiple Vitamin (MULTIVITAMIN) capsule Take 1 capsule by mouth daily.        . nitroGLYCERIN (NITROSTAT) 0.4 MG SL tablet Place 1 tablet (0.4 mg total) under the tongue every 5 (five) minutes as needed for chest pain.  24 tablet  1  . pantoprazole (PROTONIX) 40 MG tablet Take 1 tablet (40 mg total) by mouth 2 (two) times daily.  180 tablet  2  . promethazine (PHENERGAN) 25 MG tablet Take 1 tablet by mouth as needed.      . simvastatin (ZOCOR) 20 MG tablet Take 1 tablet (20 mg total) by mouth at  bedtime.  90 tablet  3  . zolpidem (AMBIEN CR) 12.5 MG CR tablet Take 1 tablet (12.5 mg total) by mouth at bedtime as needed for sleep.  30 tablet  0   No current facility-administered medications for this visit.    Allergies  Penicillins; Azithromycin; Lansoprazole; Sulfa antibiotics; Ceclor; Ceftin; Clindamycin/lincomycin; Latex; and Septra  Electrocardiogram:  SR LBBB rate 81    Assessment and Plan  1.  NICM  LVEF has normalized    2.  Hx CP  Patient without complaints  3.  GERD  Patient remains asymptomatic  4.  HL  Will set up for fasting lipids  5.  Neuro.  Patient has been seen by neuro for numbness in hands  Ques carpal tunnel  Splints recommended. She has also had transient visual loss in past  Being set up for carotid USN    6  Psych.  Gave recomm for D G[place] for counselling.

## 2013-02-11 NOTE — Patient Instructions (Signed)
Your physician has requested that you have a carotid duplex. This test is an ultrasound of the carotid arteries in your neck. It looks at blood flow through these arteries that supply the brain with blood. Allow one hour for this exam. There are no restrictions or special instructions.  Your physician recommends that you return for lab work with carotids dopplers - DO NOT EAT PRIOR TO LABS  Your physician wants you to follow-up in: 1 YR WITH DR ROSS. You will receive a reminder letter in the mail two months in advance. If you don't receive a letter, please call our office to schedule the follow-up appointment.

## 2013-02-14 ENCOUNTER — Other Ambulatory Visit: Payer: Medicare Other

## 2013-02-15 ENCOUNTER — Other Ambulatory Visit: Payer: Medicare Other

## 2013-02-22 ENCOUNTER — Ambulatory Visit (HOSPITAL_COMMUNITY): Payer: Medicare Other | Attending: Cardiology

## 2013-02-22 ENCOUNTER — Other Ambulatory Visit (INDEPENDENT_AMBULATORY_CARE_PROVIDER_SITE_OTHER): Payer: Medicare Other

## 2013-02-22 ENCOUNTER — Encounter: Payer: Self-pay | Admitting: Cardiology

## 2013-02-22 DIAGNOSIS — I658 Occlusion and stenosis of other precerebral arteries: Secondary | ICD-10-CM | POA: Insufficient documentation

## 2013-02-22 DIAGNOSIS — R200 Anesthesia of skin: Secondary | ICD-10-CM

## 2013-02-22 DIAGNOSIS — E785 Hyperlipidemia, unspecified: Secondary | ICD-10-CM | POA: Insufficient documentation

## 2013-02-22 DIAGNOSIS — Z87891 Personal history of nicotine dependence: Secondary | ICD-10-CM | POA: Insufficient documentation

## 2013-02-22 DIAGNOSIS — I6529 Occlusion and stenosis of unspecified carotid artery: Secondary | ICD-10-CM

## 2013-02-22 DIAGNOSIS — R209 Unspecified disturbances of skin sensation: Secondary | ICD-10-CM | POA: Insufficient documentation

## 2013-02-22 DIAGNOSIS — E7800 Pure hypercholesterolemia, unspecified: Secondary | ICD-10-CM

## 2013-02-22 DIAGNOSIS — I428 Other cardiomyopathies: Secondary | ICD-10-CM

## 2013-02-22 LAB — CBC WITH DIFFERENTIAL/PLATELET
Basophils Absolute: 0 10*3/uL (ref 0.0–0.1)
Basophils Relative: 0.6 % (ref 0.0–3.0)
Eosinophils Absolute: 0.3 10*3/uL (ref 0.0–0.7)
Hemoglobin: 13.3 g/dL (ref 12.0–15.0)
Lymphocytes Relative: 28.6 % (ref 12.0–46.0)
MCHC: 33.3 g/dL (ref 30.0–36.0)
Monocytes Absolute: 0.5 10*3/uL (ref 0.1–1.0)
Monocytes Relative: 9.2 % (ref 3.0–12.0)
Neutro Abs: 2.8 10*3/uL (ref 1.4–7.7)
Neutrophils Relative %: 54.9 % (ref 43.0–77.0)
RDW: 13 % (ref 11.5–14.6)
WBC: 5 10*3/uL (ref 4.5–10.5)

## 2013-02-22 LAB — BASIC METABOLIC PANEL
Calcium: 9.5 mg/dL (ref 8.4–10.5)
Creatinine, Ser: 0.6 mg/dL (ref 0.4–1.2)
GFR: 103.5 mL/min (ref >60.00)
Glucose, Bld: 99 mg/dL (ref 70–99)
Potassium: 4 mEq/L (ref 3.5–5.1)
Sodium: 139 mEq/L (ref 135–145)

## 2013-02-22 LAB — LIPID PANEL
Cholesterol: 161 mg/dL (ref 0–200)
HDL: 57 mg/dL (ref >39.00)
LDL Cholesterol: 75 mg/dL (ref 0–99)
Total CHOL/HDL Ratio: 3
Triglycerides: 146 mg/dL (ref 0.0–149.0)
VLDL: 29.2 mg/dL (ref 0.0–40.0)

## 2013-02-22 LAB — HEPATIC FUNCTION PANEL
AST: 19 U/L (ref 0–37)
Albumin: 4.1 g/dL (ref 3.5–5.2)
Alkaline Phosphatase: 55 U/L (ref 39–117)
Bilirubin, Direct: 0.1 mg/dL (ref 0.0–0.3)
Total Bilirubin: 0.7 mg/dL (ref 0.3–1.2)

## 2013-02-23 ENCOUNTER — Other Ambulatory Visit: Payer: Self-pay | Admitting: Internal Medicine

## 2013-02-23 NOTE — Telephone Encounter (Signed)
Rx called in to pharmacy. 

## 2013-02-23 NOTE — Telephone Encounter (Signed)
Ok to phone in [place]

## 2013-02-24 ENCOUNTER — Telehealth: Payer: Self-pay

## 2013-02-25 ENCOUNTER — Encounter: Payer: Self-pay | Admitting: *Deleted

## 2013-02-25 NOTE — Telephone Encounter (Signed)
Opened in error

## 2013-02-28 ENCOUNTER — Telehealth: Payer: Self-pay | Admitting: Internal Medicine

## 2013-02-28 NOTE — Telephone Encounter (Signed)
Follow up    Pt needs a call back on her 12/16 lab results please.

## 2013-02-28 NOTE — Telephone Encounter (Signed)
Spoke with pt, aware of normal labs 

## 2013-03-08 ENCOUNTER — Other Ambulatory Visit: Payer: Self-pay | Admitting: Internal Medicine

## 2013-03-08 NOTE — Telephone Encounter (Signed)
Last filled 01/05/13-- please advise

## 2013-03-15 ENCOUNTER — Ambulatory Visit (INDEPENDENT_AMBULATORY_CARE_PROVIDER_SITE_OTHER): Payer: Medicare HMO | Admitting: Neurology

## 2013-03-15 ENCOUNTER — Encounter: Payer: Self-pay | Admitting: Neurology

## 2013-03-15 VITALS — BP 106/62 | HR 68 | Resp 14 | Ht 62.0 in | Wt 124.4 lb

## 2013-03-15 DIAGNOSIS — R209 Unspecified disturbances of skin sensation: Secondary | ICD-10-CM

## 2013-03-15 DIAGNOSIS — R202 Paresthesia of skin: Secondary | ICD-10-CM

## 2013-03-15 MED ORDER — GABAPENTIN 300 MG PO CAPS: ORAL_CAPSULE | ORAL | Status: DC

## 2013-03-15 NOTE — Patient Instructions (Signed)
1.  EMG of the upper extremities 2.  Start taking neurontin 300mg  at bedtime x 3 days, if tolerating, start taking one tablet twice daily.  While we are starting neurontin, please stop taking ambien until we know how you will do with the neurontin.  If you are not too sleepy or groggy in the morning after three days of taking neurontin at bedtime, you may resume ambien. 3.  Recommend using wrist splint during day and night 4.  Return to clinic in 82-month

## 2013-03-15 NOTE — Progress Notes (Signed)
Follow-up Visit   Date: 03/15/2013    [name] MRN: [id number] DOB: May 11, 1945   Interim History: [name] is a 68 y.o. right-handed Caucasian female with history of nonischemic cardiomyopathy (EF 50-55%), migraines, GERD, hyperlipidemia, and anxiety returning to the clinic for follow-up of left hand numbness.  She was last seen in the office on 02/10/2013.  Since then, she reports having worsening of tingling/numbness over the left palmer and dorsal surface and occasional similar symptoms involving the right hand.  Pain is waking her up at night, despite using the wrist splint.  She underwent US carotids which showed US carotids showed minimal narrowing.  She denies any neck pain or weakness.     History of present illness: Around the first week of November 2014, she woke up in the middle of the night with numbness of the left hand. Symptoms persisted for 45-minutes and she was concerned about a stroke, so she went to the emergency department who referred her to neurology. They recommended that she follow-up with a neurologist.    She had one episode last year where she lost her vision from both eyes while reading the paper, lasting brief seconds. She fell backwards while on the bathmat and hit her head. Further, she complains of intermittent dizzy spells.   Of note, her husband (pediatrician) passed away unexpectedly about 41-months ago. He was on a ladder and she witnessed him have a traumatic fall. Understandably, she reports being under a great deal of stress related to new adjustments.    Medications:  Current Outpatient Prescriptions on File Prior to Visit  Medication Sig Dispense Refill  . amLODipine (NORVASC) 2.5 MG tablet Take 1 tablet (2.5 mg total) by mouth daily.  90 tablet  3  . Calcium Carbonate-Vitamin D (CALCIUM 600 + D PO) Take 1 tablet by mouth 2 (two) times daily.      . carvedilol (COREG) 12.5 MG tablet Take 1 tablet (12.5 mg total) by mouth 2 (two)  times daily with a meal.  180 tablet  3  . enalapril (VASOTEC) 10 MG tablet Take 1 tablet (10 mg total) by mouth daily.  90 tablet  3  . escitalopram (LEXAPRO) 20 MG tablet TAKE ONE TABLET BY MOUTH EVERY DAY  30 tablet  2  . Multiple Vitamin (MULTIVITAMIN) capsule Take 1 capsule by mouth daily.        . nitroGLYCERIN (NITROSTAT) 0.4 MG SL tablet Place 1 tablet (0.4 mg total) under the tongue every 5 (five) minutes as needed for chest pain.  24 tablet  1  . pantoprazole (PROTONIX) 40 MG tablet Take 1 tablet (40 mg total) by mouth 2 (two) times daily.  180 tablet  2  . promethazine (PHENERGAN) 25 MG tablet Take 1 tablet by mouth as needed.      . simvastatin (ZOCOR) 20 MG tablet Take 1 tablet (20 mg total) by mouth at bedtime.  90 tablet  3  . zolpidem (AMBIEN CR) 12.5 MG CR tablet TAKE ONE TABLET BY MOUTH AT BEDTIME AS NEEDED FOR SLEEP  30 tablet  0   No current facility-administered medications on file prior to visit.    Allergies:  Allergies  Allergen Reactions  . Penicillins Hives  . Azithromycin Other (See Comments)    Pt denies-states she does take a z pack  . Lansoprazole     REACTION: headaches  . Sulfa Antibiotics Hives  . Ceclor [Cefaclor] Itching and Rash  . Ceftin [Cefuroxime Axetil] Rash  .  Clindamycin/Lincomycin Itching and Rash  . Latex Itching and Rash  . Septra [Sulfamethoxazole-Trimethoprim] Rash     Review of Systems:  CONSTITUTIONAL: No fevers, chills, night sweat, or weight loss.   EYES: No visual changes or eye pain ENT: No hearing changes.  No history of nose bleeds.   RESPIRATORY: No cough, wheezing and shortness of breath.   CARDIOVASCULAR: Negative for chest pain, and palpitations.   GI: Negative for abdominal discomfort, blood in stools or black stools.  No recent change in bowel habits.   GU:  No history of incontinence.   MUSCLOSKELETAL: No history of joint pain or swelling.  No myalgias.   SKIN: Negative for lesions, rash, and itching.   ENDOCRINE:  Negative for cold or heat intolerance, polydipsia or goiter.   PSYCH:  + depression or anxiety symptoms.   NEURO: As Above.   Vital Signs:  BP 106/62  Pulse 68  Resp 14  Ht 5\' 2"  (1.575 m)  Wt 124 lb 6 oz (56.416 kg)  BMI 22.74 kg/m2   Neurological Exam: MENTAL STATUS including orientation to time, place, person, recent and remote memory, attention span and concentration, language, and fund of knowledge is normal.  Speech is not dysarthric.  CRANIAL NERVES: No visual field defects.  Pupils equal round and reactive to light.  Normal conjugate, extra-ocular eye movements in all directions of gaze.  No ptosis. Normal facial sensation.  Face is symmetric. Palate elevates symmetrically.  Tongue is midline.  MOTOR:  Motor strength is 5/5 in all extremities, except left ABP is 5-/5.  No atrophy, fasciculations or abnormal movements.  No pronator drift.  Tone is normal.    MSRs:  Reflexes are 2+/4 throughout, except 3+ patella jerks bilaterally.  SENSORY:  Intact to pin prick over the palmer and dorsal surface of the hands.  COORDINATION/GAIT:  Normal finger-to- nose-finger.  Intact rapid alternating movements bilaterally.  Gait narrow based and stable.   Other tests: Tinel's sign at the left carpal tunnel.  Phalen's test and Prayer test is negative.    Data: Component     Latest Ref Rng 01/13/2013  Vitamin B-12     211 - 911 pg/mL 380    IMPRESSION/PLAN: 1.  Left hand paresthesias, worsening and now also involving the right hand  -She has been using a wrist splint for suspected CTS without any benefit  - Diagnostic testing to include EMG to better characterize her symptoms (?CTS vs radiculopathy)  - For symptomatic management, start neurontin 300mg  at bedtime and titrate further as needed  - Continue to use wrist splint at bedtime and during the day  - Discussed avoidance of wrist hyperflexion 2.  History of transient neurological symptoms (bilateral vision loss, dizziness)  -  US carotids with minimal narrowing  - Will continue to follow 3.  Return to clinic in 39-month   The duration of this appointment visit was 25 minutes of face-to-face time with the patient.  Greater than 50% of this time was spent in counseling, explanation of diagnosis, planning of further management, and coordination of care.   Thank you for allowing me to participate in patient's care.  If I can answer any additional questions, I would be pleased to do so.    Sincerely,    Jisella Ashenfelter K. Allena Katz, DO

## 2013-03-22 ENCOUNTER — Other Ambulatory Visit: Payer: Self-pay | Admitting: Internal Medicine

## 2013-03-22 MED ORDER — ZOLPIDEM TARTRATE 10 MG PO TABS: 10.0000 mg | ORAL_TABLET | Freq: Every evening | ORAL | Status: DC | PRN

## 2013-03-22 NOTE — Telephone Encounter (Signed)
Lm on pts vm informing her that Rx has been sent to requested pharmacy

## 2013-03-22 NOTE — Telephone Encounter (Signed)
Pt wants 10mg  tabs instead of 12.5--Last filled 02/23/13

## 2013-04-11 ENCOUNTER — Ambulatory Visit (INDEPENDENT_AMBULATORY_CARE_PROVIDER_SITE_OTHER): Payer: Medicare HMO | Admitting: Neurology

## 2013-04-11 ENCOUNTER — Encounter: Payer: Self-pay | Admitting: Neurology

## 2013-04-11 DIAGNOSIS — G5600 Carpal tunnel syndrome, unspecified upper limb: Secondary | ICD-10-CM

## 2013-04-11 NOTE — Procedures (Signed)
[facility]  [address], [place]  [place], [place] [address] Tel: [phone] Fax:  [phone] Test Date:  04/11/2013  Patient: [name] DOB: 1945/12/02 Physician: Narda Amber, DO  Sex: Female Height: 5\' 2"  Ref Phys: Narda Amber  ID#: [id number] Temp: 34.6C Technician:    Patient Complaints: This is a 68 year-old female presenting for evalution of bilateral hand paresthesias.  NCV & EMG Findings: Extensive evaluation of the left upper extremity and additional studies of the right reveals:  1. Prolonged median sensory responses bilaterally. Ulnar responses are normal.  2. The left median motor response shows prolonged latency with preserved amplitude. 3. The right median motor study recorded from abductor pollicis brevis (APB) muscle obtained by stimulating the nerve at the wrist showed reduced amplitude.  Stimulating the nerve at the elbow showed a similar appearance and amplitude, excluding anastomosis in the forearm.  Recording from APB, the ulnar nerve was stimulated at the wrist and cubital tunnel which produced a motor response with reduced CMAP amplitude. These findings are most consistent with dual innervation of APB by median and ulnar nerves, suggestive of an Riche-Cannieu anastomosis (RCA), a normal variant.   4. There is no evidence of active or chronic motor axonal loss changes involving the tested muscles.  Impression: 1. Bilateral median neuropathy at or distal to the wrist consistent with the clinical diagnosis of carpal tunnel syndrome. Overall, these changes are mild-moderate in degree electrically and worse on the left side. 2. Incidental finding of right Riche-Cannieu anastomosis, which is a normal variant.  3. There is no evidence of a cervical motor radiculopathy affecting the left side.   ___________________________ Narda Amber, DO    Nerve Conduction Studies Anti Sensory Summary Table   Site NR Peak (ms) Norm Peak (ms) P-T Amp (V)  Norm P-T Amp  Left Median Anti Sensory (2nd Digit)  Wrist    4.7 <3.8 12.7 >10  Right Median Anti Sensory (2nd Digit)  Wrist    4.5 <3.8 18.0 >10  Left Ulnar Anti Sensory (5th Digit)  Wrist    2.6 <3.2 34.7 >5  Right Ulnar Anti Sensory (5th Digit)  Wrist    2.6 <3.2 39.1 >5   Motor Summary Table   Site NR Onset (ms) Norm Onset (ms) O-P Amp (mV) Norm O-P Amp Site1 Site2 Delta-0 (ms) Dist (cm) Vel (m/s) Norm Vel (m/s)  Left Median Motor (Abd Poll Brev)  Wrist    4.2 <4.0 7.7 >5 Elbow Wrist 4.4 28.0 64 >50  Elbow    8.6  7.3         Right Median Motor (Abd Poll Brev)  Wrist    4.9 <4.0 4.0 >5 Elbow Wrist 4.6 26.0 57 >50  Elbow    9.5  3.9  Ulnar wrist Elbow 5.8 0.0    Ulnar wrist    3.7  3.6  Ulnar above elbow Ulnar wrist 4.5 0.0    Ulnar above elbow    8.2  3.1         Left Ulnar Motor (Abd Dig Minimi)  Wrist    2.0 <3.1 8.9 >7 B Elbow Wrist 2.9 19.0 66 >50  B Elbow    4.9  9.5  A Elbow B Elbow 1.3 10.0 77 >50  A Elbow    6.2  9.5         Right Ulnar Motor (Abd Dig Minimi)  Wrist    2.0 <3.1 9.4 >7 B Elbow Wrist 2.9 19.0 66 >50  B Elbow    4.9  10.3  A Elbow B Elbow 1.4 10.0 71 >50  A Elbow    6.3  10.3          EMG   Side Muscle Ins Act Fibs Psw Fasc Number Recrt Dur Dur. Amp Amp. Poly Poly. Comment  Left 1stDorInt Nml Nml Nml Nml Nml Nml Nml Nml Nml Nml Nml Nml N/A  Left Abd Poll Brev Nml Nml Nml Nml Nml Nml Nml Nml Nml Nml Nml Nml N/A  Left FlexPolLong Nml Nml Nml Nml Nml Nml Nml Nml Nml Nml Nml Nml N/A  Left PronatorTeres Nml Nml Nml Nml Nml Nml Nml Nml Nml Nml Nml Nml N/A  Left Biceps Nml Nml Nml Nml Nml Nml Nml Nml Nml Nml Nml Nml N/A  Left Triceps Nml Nml Nml Nml Nml Nml Nml Nml Nml Nml Nml Nml N/A  Left Deltoid Nml Nml Nml Nml Nml Nml Nml Nml Nml Nml Nml Nml N/A  Right Abd Poll Brev Nml Nml Nml Nml Nml Nml Nml Nml Nml Nml Nml Nml N/A      Waveforms:

## 2013-04-11 NOTE — Progress Notes (Signed)
See procedure note under "Notes" tab for EMG results.  Beldon Nowling K. Granvel Proudfoot, DO  

## 2013-04-21 ENCOUNTER — Other Ambulatory Visit: Payer: Self-pay | Admitting: Family Medicine

## 2013-05-12 ENCOUNTER — Ambulatory Visit: Payer: Medicare Other | Admitting: Neurology

## 2013-05-18 ENCOUNTER — Other Ambulatory Visit: Payer: Self-pay | Admitting: Family Medicine

## 2013-05-18 NOTE — Telephone Encounter (Signed)
Pt requesting medication refill. Last ov 12/2012 with no future appts scheduled. pls advise 

## 2013-05-18 NOTE — Telephone Encounter (Signed)
Spoke to pt and informed her Rx has been called in to requested pharmacy 

## 2013-05-23 ENCOUNTER — Ambulatory Visit (INDEPENDENT_AMBULATORY_CARE_PROVIDER_SITE_OTHER): Payer: Medicare HMO | Admitting: Internal Medicine

## 2013-05-23 ENCOUNTER — Encounter: Payer: Self-pay | Admitting: Internal Medicine

## 2013-05-23 ENCOUNTER — Telehealth: Payer: Self-pay | Admitting: Family Medicine

## 2013-05-23 VITALS — BP 114/72 | HR 71 | Temp 98.5°F | Wt 124.8 lb

## 2013-05-23 DIAGNOSIS — B9789 Other viral agents as the cause of diseases classified elsewhere: Secondary | ICD-10-CM

## 2013-05-23 DIAGNOSIS — J329 Chronic sinusitis, unspecified: Secondary | ICD-10-CM

## 2013-05-23 DIAGNOSIS — B309 Viral conjunctivitis, unspecified: Secondary | ICD-10-CM

## 2013-05-23 MED ORDER — PROMETHAZINE HCL 12.5 MG PO TABS: 12.5000 mg | ORAL_TABLET | Freq: Three times a day (TID) | ORAL | Status: DC | PRN

## 2013-05-23 NOTE — Patient Instructions (Addendum)

## 2013-05-23 NOTE — Telephone Encounter (Signed)
Patient Information:  Caller Name: [name]  Phone: [phone]  Patient: [name]  Gender: Female  DOB: 19-Jan-1946  Age: 68 Years  PCP: Arnette Norris [facility])  Office Follow Up:  Does the office need to follow up with this patient?: No  Instructions For The Office: N/A   Symptoms  Reason For Call & Symptoms: Started with congestionon 05/17/13 and today congestion is worse, ears are hurting, and facial pain with bending forward. Occasional cough and slight sore throat. Blowing Thick clear mucous from nose.   Reviewed Health History In EMR: Yes  Reviewed Medications In EMR: Yes  Reviewed Allergies In EMR: Yes  Reviewed Surgeries / Procedures: Yes  Date of Onset of Symptoms: 05/16/2013  Guideline(s) Used:  Ear - Congestion  Colds  Disposition Per Guideline:   See Today in Office  Reason For Disposition Reached:   Earache  Advice Given:  Reassurance  It sounds like an uncomplicated cold that we can treat at home.  Colds are very common and may make you feel uncomfortable.  Colds are caused by viruses, and no medicine or "shot" will cure an uncomplicated cold.  Colds are usually not serious.  Here is some care advice that should help.  For a Stuffy Nose - Use Nasal Washes:  Introduction: Saline (salt water) nasal irrigation (nasal wash) is an effective and simple home remedy for treating stuffy nose and sinus congestion. The nose can be irrigated by pouring, spraying, or squirting salt water into the nose and then letting it run back out.  Humidifier:  If the air in your home is dry, use a cool-mist humidifier  Contagiousness:  The cold virus is present in your nasal secretions.  Cover your nose and mouth with a tissue when you sneeze or cough.  Wash your hands frequently with soap and water.  You can return to work or school after the fever is gone and you feel well enough to participate in normal activities.  Expected Course:   Fever may last 2-3 days  Nasal  discharge 7-14 days  Cough up to 2-3 weeks.  Call Back If:  Difficulty breathing occurs  Fever lasts more than 3 days  Nasal discharge lasts more than 10 days  Cough lasts more than 3 weeks  You become worse  Patient Will Follow Care Advice:  YES  Appointment Scheduled:  05/23/2013 14:30:00 Appointment Scheduled Provider:  Webb Silversmith

## 2013-05-23 NOTE — Progress Notes (Signed)
Pre visit review using our clinic review tool, if applicable. No additional management support is needed unless otherwise documented below in the visit note. 

## 2013-05-23 NOTE — Progress Notes (Signed)
HPI  Pt presents to the clinic today with c/o sinus pain and pressure on the right side of her face. This started about 1 week ago. She also c/o ear fullness and eye pain on the right side. She is blowing clear mucous out of her nose. She has not taken anything OTC. She has no history of allergies or asthma. She has not had sick contacts that she is aware of.  Review of Systems    Past Medical History  Diagnosis Date  . Migraine   . Cardiomyopathy     nonischemic  . GERD (gastroesophageal reflux disease)   . Erosive esophagitis     LA Classification Grade B  . Hyperlipidemia   . Osteopenia   . Hiatal hernia   . Melanoma     basil cell  . Diabetes mellitus     pt states she is not diabetic  . Sessile colonic polyp 10/2011    adenoma    Family History  Problem Relation Age of Onset  . Heart disease Father   . Alzheimer's disease Mother   . Colon cancer Neg Hx   . Stomach cancer Neg Hx   . Rectal cancer Neg Hx   . Esophageal cancer Neg Hx   . Arthritis/Rheumatoid Sister     History   Social History  . Marital Status: Married    Spouse Name: N/A    Number of Children: 3  . Years of Education: N/A   Occupational History  . Housewife    Social History Main Topics  . Smoking status: Former Smoker    Quit date: 06/03/1981  . Smokeless tobacco: Never Used  . Alcohol Use: 1.8 oz/week    3 Glasses of wine per week     Comment: Wine on occasional  . Drug Use: No  . Sexual Activity: Not on file   Other Topics Concern  . Not on file   Social History Narrative   Lives alone in two-story home.     Her husband recently passed away unexpectedly.  They have three children.    Allergies  Allergen Reactions  . Penicillins Hives  . Azithromycin Other (See Comments)    Pt denies-states she does take a z pack  . Lansoprazole     REACTION: headaches  . Sulfa Antibiotics Hives  . Ceclor [Cefaclor] Itching and Rash  . Ceftin [Cefuroxime Axetil] Rash  .  Clindamycin/Lincomycin Itching and Rash  . Latex Itching and Rash  . Septra [Sulfamethoxazole-Trimethoprim] Rash     Constitutional: Positive headache, fatigue . Denies fever or abrupt weight changes.  HEENT:  Positive eye pain, pressure behind the eyes, facial pain, nasal congestion and sore throat. Denies eye redness, ear pain, ringing in the ears, wax buildup, runny nose or bloody nose. Respiratory: Positive cough. Denies difficulty breathing or shortness of breath.  Cardiovascular: Denies chest pain, chest tightness, palpitations or swelling in the hands or feet.   No other specific complaints in a complete review of systems (except as listed in HPI above).  Objective:    General: Appears her stated age, well developed, well nourished in NAD. HEENT: Head: normal shape and size, sinus tenderness noted; Eyes: sclera injected, no icterus, conjunctiva erythematous, PERRLA and EOMs intact; Ears: Tm's gray and intact, normal light reflex; Nose: mucosa pink and moist, septum midline; Throat/Mouth: + PND. Teeth present, mucosa erythematous and moist, no exudate noted, no lesions or ulcerations noted.  Neck: Neck supple, trachea midline. No massses, lumps or thyromegaly present.  Cardiovascular: Normal rate and rhythm. S1,S2 noted.  No murmur, rubs or gallops noted. No JVD or BLE edema. No carotid bruits noted. Pulmonary/Chest: Normal effort and positive vesicular breath sounds. No respiratory distress. No wheezes, rales or ronchi noted.      Assessment & Plan:   Acute virul sinusitis with right conjunctivitis  Can use a Neti Pot which can be purchased from your local drug store. Flonase 2 sprays each nostril for 3 days and then as needed. Zyrtec daily OTC Warm compresses to right eye Ibuprofen for painfever  RTC as needed or if symptoms persist.

## 2013-05-23 NOTE — Addendum Note (Signed)
Addended by: Jearld Fenton on: 05/23/2013 03:53 PM   Modules accepted: Orders

## 2013-05-30 ENCOUNTER — Encounter: Payer: Self-pay | Admitting: Internal Medicine

## 2013-05-30 ENCOUNTER — Ambulatory Visit (INDEPENDENT_AMBULATORY_CARE_PROVIDER_SITE_OTHER): Payer: Medicare HMO | Admitting: Internal Medicine

## 2013-05-30 VITALS — BP 124/70 | HR 74 | Temp 98.2°F | Wt 125.8 lb

## 2013-05-30 DIAGNOSIS — J019 Acute sinusitis, unspecified: Secondary | ICD-10-CM

## 2013-05-30 MED ORDER — AZITHROMYCIN 250 MG PO TABS: ORAL_TABLET | ORAL | Status: DC

## 2013-05-30 NOTE — Progress Notes (Signed)
Pre visit review using our clinic review tool, if applicable. No additional management support is needed unless otherwise documented below in the visit note. 

## 2013-05-30 NOTE — Patient Instructions (Addendum)

## 2013-05-30 NOTE — Progress Notes (Signed)
HPI  Pt presents to the clinic today with c/o headache, cough, nasal congestion and ear pain. She reports this started about 10 days ago. She is taking claritin and tylenol. She reports that she started running fevers on Saturday. She is blowing thick yellow mucous out of her nose. The cough is unproductive.  She has no history of allergies or breathing problems. She has had sick contacts. She does not smoke.  Review of Systems    Past Medical History  Diagnosis Date  . Migraine   . Cardiomyopathy     nonischemic  . GERD (gastroesophageal reflux disease)   . Erosive esophagitis     LA Classification Grade B  . Hyperlipidemia   . Osteopenia   . Hiatal hernia   . Melanoma     basil cell  . Diabetes mellitus     pt states she is not diabetic  . Sessile colonic polyp 10/2011    adenoma    Family History  Problem Relation Age of Onset  . Heart disease Father   . Alzheimer's disease Mother   . Colon cancer Neg Hx   . Stomach cancer Neg Hx   . Rectal cancer Neg Hx   . Esophageal cancer Neg Hx   . Arthritis/Rheumatoid Sister     History   Social History  . Marital Status: Married    Spouse Name: N/A    Number of Children: 3  . Years of Education: N/A   Occupational History  . Housewife    Social History Main Topics  . Smoking status: Former Smoker    Quit date: 06/03/1981  . Smokeless tobacco: Never Used  . Alcohol Use: 1.8 oz/week    3 Glasses of wine per week     Comment: Wine on occasional  . Drug Use: No  . Sexual Activity: Not on file   Other Topics Concern  . Not on file   Social History Narrative   Lives alone in two-story home.     Her husband recently passed away unexpectedly.  They have three children.    Allergies  Allergen Reactions  . Penicillins Hives  . Azithromycin Other (See Comments)    Pt denies-states she does take a z pack  . Lansoprazole     REACTION: headaches  . Sulfa Antibiotics Hives  . Ceclor [Cefaclor] Itching and Rash  .  Ceftin [Cefuroxime Axetil] Rash  . Clindamycin/Lincomycin Itching and Rash  . Latex Itching and Rash  . Septra [Sulfamethoxazole-Trimethoprim] Rash     Constitutional: Positive headache, fatigue and fever. Denies abrupt weight changes.  HEENT:  Positive eye pain, pressure behind the eyes, facial pain, nasal congestion and sore throat. Denies eye redness, ear pain, ringing in the ears, wax buildup, runny nose or bloody nose. Respiratory: Positive cough. Denies difficulty breathing or shortness of breath.  Cardiovascular: Denies chest pain, chest tightness, palpitations or swelling in the hands or feet.   No other specific complaints in a complete review of systems (except as listed in HPI above).  Objective:   BP 124/70  Pulse 74  Temp(Src) 98.2 F (36.8 C) (Oral)  Wt 125 lb 12 oz (57.04 kg)  SpO2 97%  General: Appears her stated age, well developed, well nourished in NAD. HEENT: Head: normal shape and size, frontal sinus tenderness noted; Eyes: sclera white, no icterus, conjunctiva pink, PERRLA and EOMs intact; Ears: Tm's gray and intact, normal light reflex; Nose: mucosa pink and moist, septum midline; Throat/Mouth: + PND. Teeth present, mucosa pink  and moist, no exudate noted, no lesions or ulcerations noted.  Neck: Neck supple, trachea midline. No massses, lumps or thyromegaly present.  Cardiovascular: Normal rate and rhythm. S1,S2 noted.  No murmur, rubs or gallops noted. No JVD or BLE edema. No carotid bruits noted. Pulmonary/Chest: Normal effort and positive vesicular breath sounds. No respiratory distress. No wheezes, rales or ronchi noted.      Assessment & Plan:   Acute bacterial sinusitis  Can use a Neti Pot which can be purchased from your local drug store. Continue claritin and tylenol She reports zpack is the only thing that works for her Delsym OTC for cough  RTC as needed or if symptoms persist.

## 2013-06-02 ENCOUNTER — Other Ambulatory Visit: Payer: Self-pay

## 2013-06-02 DIAGNOSIS — J019 Acute sinusitis, unspecified: Secondary | ICD-10-CM

## 2013-06-02 MED ORDER — AZITHROMYCIN 250 MG PO TABS: ORAL_TABLET | ORAL | Status: DC

## 2013-06-02 NOTE — Telephone Encounter (Signed)
Pt left v/m;pt was seen 05/30/13 and pt is feeling better but not well and pt was told by pharmacist that she should get a refill on Zpak; pt wants to know if zpak refill can be sent to [facility].Please advise.

## 2013-06-03 NOTE — Telephone Encounter (Signed)
Pt notified zpak was sent to [facility]; when finishes antibiotic if not cleared of all symptoms pt will cb for appt.

## 2013-06-07 ENCOUNTER — Other Ambulatory Visit: Payer: Self-pay | Admitting: Internal Medicine

## 2013-06-22 ENCOUNTER — Other Ambulatory Visit: Payer: Self-pay | Admitting: Family Medicine

## 2013-06-22 NOTE — Telephone Encounter (Signed)
Spoke to pt and informed her Rx has been called in to requested pharmacy 

## 2013-06-27 ENCOUNTER — Encounter: Payer: Self-pay | Admitting: Internal Medicine

## 2013-06-27 ENCOUNTER — Ambulatory Visit (INDEPENDENT_AMBULATORY_CARE_PROVIDER_SITE_OTHER): Payer: Medicare HMO | Admitting: Internal Medicine

## 2013-06-27 VITALS — BP 132/84 | HR 67 | Temp 98.1°F | Wt 125.5 lb

## 2013-06-27 DIAGNOSIS — R3989 Other symptoms and signs involving the genitourinary system: Secondary | ICD-10-CM

## 2013-06-27 DIAGNOSIS — R350 Frequency of micturition: Secondary | ICD-10-CM

## 2013-06-27 DIAGNOSIS — N3289 Other specified disorders of bladder: Secondary | ICD-10-CM

## 2013-06-27 DIAGNOSIS — N390 Urinary tract infection, site not specified: Secondary | ICD-10-CM

## 2013-06-27 LAB — POCT URINALYSIS DIPSTICK
Bilirubin, UA: NEGATIVE
GLUCOSE UA: NEGATIVE
Ketones, UA: NEGATIVE
Nitrite, UA: NEGATIVE
Spec Grav, UA: <=1.005
UROBILINOGEN UA: 0.2
pH, UA: 8

## 2013-06-27 MED ORDER — CIPROFLOXACIN HCL 500 MG PO TABS: 500.0000 mg | ORAL_TABLET | Freq: Two times a day (BID) | ORAL | Status: DC

## 2013-06-27 NOTE — Progress Notes (Signed)
Pre visit review using our clinic review tool, if applicable. No additional management support is needed unless otherwise documented below in the visit note. 

## 2013-06-27 NOTE — Patient Instructions (Addendum)
Urinary Tract Infection  Urinary tract infections (UTIs) can develop anywhere along your urinary tract. Your urinary tract is your body's drainage system for removing wastes and extra water. Your urinary tract includes two kidneys, two ureters, a bladder, and a urethra. Your kidneys are a pair of bean-shaped organs. Each kidney is about the size of your fist. They are located below your ribs, one on each side of your spine.  CAUSES  Infections are caused by microbes, which are microscopic organisms, including fungi, viruses, and bacteria. These organisms are so small that they can only be seen through a microscope. Bacteria are the microbes that most commonly cause UTIs.  SYMPTOMS   Symptoms of UTIs may vary by age and gender of the patient and by the location of the infection. Symptoms in young women typically include a frequent and intense urge to urinate and a painful, burning feeling in the bladder or urethra during urination. Older women and men are more likely to be tired, shaky, and weak and have muscle aches and abdominal pain. A fever may mean the infection is in your kidneys. Other symptoms of a kidney infection include pain in your back or sides below the ribs, nausea, and vomiting.  DIAGNOSIS  To diagnose a UTI, your caregiver will ask you about your symptoms. Your caregiver also will ask to provide a urine sample. The urine sample will be tested for bacteria and white blood cells. White blood cells are made by your body to help fight infection.  TREATMENT   Typically, UTIs can be treated with medication. Because most UTIs are caused by a bacterial infection, they usually can be treated with the use of antibiotics. The choice of antibiotic and length of treatment depend on your symptoms and the type of bacteria causing your infection.  HOME CARE INSTRUCTIONS   If you were prescribed antibiotics, take them exactly as your caregiver instructs you. Finish the medication even if you feel better after you  have only taken some of the medication.   Drink enough water and fluids to keep your urine clear or pale yellow.   Avoid caffeine, tea, and carbonated beverages. They tend to irritate your bladder.   Empty your bladder often. Avoid holding urine for long periods of time.   Empty your bladder before and after sexual intercourse.   After a bowel movement, women should cleanse from front to back. Use each tissue only once.  SEEK MEDICAL CARE IF:    You have back pain.   You develop a fever.   Your symptoms do not begin to resolve within 3 days.  SEEK IMMEDIATE MEDICAL CARE IF:    You have severe back pain or lower abdominal pain.   You develop chills.   You have nausea or vomiting.   You have continued burning or discomfort with urination.  MAKE SURE YOU:    Understand these instructions.   Will watch your condition.   Will get help right away if you are not doing well or get worse.  Document Released: 12/04/2004 Document Revised: 08/26/2011 Document Reviewed: 04/04/2011  [employer] Patient Information 2014 [employer]

## 2013-06-27 NOTE — Progress Notes (Addendum)
HPI  Pt presents to the clinic today with c/o urinary urgency and bladder pressure. She reports this started 2 days ago. She denies fever, chills or back pain. She has had a UTI in the past and reports this feels the same. She has not treated with anything OTC.   Review of Systems  Past Medical History  Diagnosis Date  . Migraine   . Cardiomyopathy     nonischemic  . GERD (gastroesophageal reflux disease)   . Erosive esophagitis     LA Classification Grade B  . Hyperlipidemia   . Osteopenia   . Hiatal hernia   . Melanoma     basil cell  . Diabetes mellitus     pt states she is not diabetic  . Sessile colonic polyp 10/2011    adenoma    Family History  Problem Relation Age of Onset  . Heart disease Father   . Alzheimer's disease Mother   . Colon cancer Neg Hx   . Stomach cancer Neg Hx   . Rectal cancer Neg Hx   . Esophageal cancer Neg Hx   . Arthritis/Rheumatoid Sister     History   Social History  . Marital Status: Married    Spouse Name: N/A    Number of Children: 3  . Years of Education: N/A   Occupational History  . Housewife    Social History Main Topics  . Smoking status: Former Smoker    Quit date: 06/03/1981  . Smokeless tobacco: Never Used  . Alcohol Use: 1.8 oz/week    3 Glasses of wine per week     Comment: Wine on occasional  . Drug Use: No  . Sexual Activity: Not on file   Other Topics Concern  . Not on file   Social History Narrative   Lives alone in two-story home.     Her husband recently passed away unexpectedly.  They have three children.    Allergies  Allergen Reactions  . Penicillins Hives  . Azithromycin Other (See Comments)    Pt denies-states she does take a z pack  . Lansoprazole     REACTION: headaches  . Sulfa Antibiotics Hives  . Ceclor [Cefaclor] Itching and Rash  . Ceftin [Cefuroxime Axetil] Rash  . Clindamycin/Lincomycin Itching and Rash  . Latex Itching and Rash  . Septra [Sulfamethoxazole-Trimethoprim] Rash     Constitutional: Denies fever, malaise, fatigue, headache or abrupt weight changes.   GU: Pt reports urgency, and bladder pressure. Denies frequency, pain, burning sensation, blood in urine, odor or discharge. Skin: Denies redness, rashes, lesions or ulcercations.   No other specific complaints in a complete review of systems (except as listed in HPI above).    Objective:   Physical Exam  BP 132/84  Pulse 67  Temp(Src) 98.1 F (36.7 C) (Oral)  Wt 125 lb 8 oz (56.926 kg)  SpO2 98% Wt Readings from Last 3 Encounters:  06/27/13 125 lb 8 oz (56.926 kg)  05/30/13 125 lb 12 oz (57.04 kg)  05/23/13 124 lb 12 oz (56.586 kg)    General: Appears her stated age, well developed, well nourished in NAD. Cardiovascular: Normal rate and rhythm. S1,S2 noted.  No murmur, rubs or gallops noted. No JVD or BLE edema. No carotid bruits noted. Pulmonary/Chest: Normal effort and positive vesicular breath sounds. No respiratory distress. No wheezes, rales or ronchi noted.  Abdomen: Soft and nontender. Normal bowel sounds, no bruits noted. No distention or masses noted. Liver, spleen and kidneys non palpable.  Tender to palpation over the bladder area. No CVA tenderness.      Assessment & Plan:   Urgency, bladder pressure secondary to UTI:   Urinalysis: trace blood, mod leuks, neg nitrates Will send for urine culture eRx sent if for Cipro 500 mg BID x 5 days OK to take AZO OTC Drink plenty of fluids  RTC as needed or if symptoms persist.

## 2013-06-27 NOTE — Addendum Note (Signed)
Addended by: Lurlean Nanny on: 06/27/2013 11:33 AM   Modules accepted: Orders

## 2013-06-29 ENCOUNTER — Telehealth: Payer: Self-pay

## 2013-06-29 LAB — URINE CULTURE: Colony Count: >=100000

## 2013-06-29 NOTE — Telephone Encounter (Signed)
Pt left v/m; pt thinks she needs PA for Lexapro; spoke with Terri at [facility];Terri said pt does not need PA; pt requesting med too early last filled on 06/07/13. Terri said to tell pt to call her. Advised pt and pt will contact [facility].

## 2013-07-01 ENCOUNTER — Other Ambulatory Visit: Payer: Self-pay | Admitting: Internal Medicine

## 2013-07-18 ENCOUNTER — Encounter: Payer: Self-pay | Admitting: Internal Medicine

## 2013-07-18 ENCOUNTER — Ambulatory Visit (INDEPENDENT_AMBULATORY_CARE_PROVIDER_SITE_OTHER): Payer: Medicare HMO | Admitting: Internal Medicine

## 2013-07-18 VITALS — BP 110/68 | HR 64 | Temp 98.8°F | Wt 127.5 lb

## 2013-07-18 DIAGNOSIS — B9789 Other viral agents as the cause of diseases classified elsewhere: Principal | ICD-10-CM

## 2013-07-18 DIAGNOSIS — J069 Acute upper respiratory infection, unspecified: Secondary | ICD-10-CM

## 2013-07-18 MED ORDER — HYDROCODONE-HOMATROPINE 5-1.5 MG/5ML PO SYRP: 5.0000 mL | ORAL_SOLUTION | Freq: Three times a day (TID) | ORAL | Status: DC | PRN

## 2013-07-18 NOTE — Progress Notes (Signed)
HPI  Pt presents to the clinic today with c/o cough, body aches, sore throat and fever. She reports this started 2 days ago. The cough is non productive. She has had some associated headache. She denies fever, or shortness of breath. She has been trying Claritin. She has not had sick contacts that she is aware of.  Review of Systems      Past Medical History  Diagnosis Date  . Migraine   . Cardiomyopathy     nonischemic  . GERD (gastroesophageal reflux disease)   . Erosive esophagitis     LA Classification Grade B  . Hyperlipidemia   . Osteopenia   . Hiatal hernia   . Melanoma     basil cell  . Diabetes mellitus     pt states she is not diabetic  . Sessile colonic polyp 10/2011    adenoma    Family History  Problem Relation Age of Onset  . Heart disease Father   . Alzheimer's disease Mother   . Colon cancer Neg Hx   . Stomach cancer Neg Hx   . Rectal cancer Neg Hx   . Esophageal cancer Neg Hx   . Arthritis/Rheumatoid Sister     History   Social History  . Marital Status: Married    Spouse Name: N/A    Number of Children: 3  . Years of Education: N/A   Occupational History  . Housewife    Social History Main Topics  . Smoking status: Former Smoker    Quit date: 06/03/1981  . Smokeless tobacco: Never Used  . Alcohol Use: 1.8 oz/week    3 Glasses of wine per week     Comment: Wine on occasional  . Drug Use: No  . Sexual Activity: Not on file   Other Topics Concern  . Not on file   Social History Narrative   Lives alone in two-story home.     Her husband recently passed away unexpectedly.  They have three children.    Allergies  Allergen Reactions  . Penicillins Hives  . Azithromycin Other (See Comments)    Pt denies-states she does take a z pack  . Lansoprazole     REACTION: headaches  . Sulfa Antibiotics Hives  . Ceclor [Cefaclor] Itching and Rash  . Ceftin [Cefuroxime Axetil] Rash  . Clindamycin/Lincomycin Itching and Rash  . Latex Itching  and Rash  . Septra [Sulfamethoxazole-Trimethoprim] Rash     Constitutional: Positive headache, fatigue and fever. Denies abrupt weight changes.  HEENT:  Positive sore throat. Denies eye redness, eye pain, pressure behind the eyes, facial pain, nasal congestion, ear pain, ringing in the ears, wax buildup, runny nose or bloody nose. Respiratory: Positive cough. Denies difficulty breathing or shortness of breath.  Cardiovascular: Denies chest pain, chest tightness, palpitations or swelling in the hands or feet.   No other specific complaints in a complete review of systems (except as listed in HPI above).  Objective:   BP 110/68  Pulse 64  Temp(Src) 98.8 F (37.1 C) (Oral)  Wt 127 lb 8 oz (57.834 kg) Wt Readings from Last 3 Encounters:  07/18/13 127 lb 8 oz (57.834 kg)  06/27/13 125 lb 8 oz (56.926 kg)  05/30/13 125 lb 12 oz (57.04 kg)     General: Appears her stated age, well developed, well nourished in NAD. HEENT: Head: normal shape and size; Eyes: sclera white, no icterus, conjunctiva pink, PERRLA and EOMs intact; Ears: Tm's gray and intact, normal light reflex; Nose: mucosa  pink and moist, septum midline; Throat/Mouth: + PND. Teeth present, mucosa erythematous and moist, no exudate noted, no lesions or ulcerations noted.  Neck: Mild cervical lymphadenopathy. Neck supple, trachea midline. No massses, lumps or thyromegaly present.  Cardiovascular: Normal rate and rhythm. S1,S2 noted.  No murmur, rubs or gallops noted. No JVD or BLE edema. No carotid bruits noted. Pulmonary/Chest: Normal effort and positive vesicular breath sounds. No respiratory distress. No wheezes, rales or ronchi noted.      Assessment & Plan:   Upper Respiratory Infection, likely viral at this point:  Get some rest and drink plenty of water Do salt water gargles for the sore throat eRx for Hycodan cough syrup  RTC as needed or if symptoms persist.

## 2013-07-18 NOTE — Progress Notes (Signed)
Pre visit review using our clinic review tool, if applicable. No additional management support is needed unless otherwise documented below in the visit note. 

## 2013-07-18 NOTE — Patient Instructions (Addendum)

## 2013-07-20 ENCOUNTER — Telehealth: Payer: Self-pay | Admitting: Family Medicine

## 2013-07-20 NOTE — Telephone Encounter (Signed)
Patient Information:  Caller Name: Othelia  Phone: [phone]  Patient: [name], [name]  Gender: Female  DOB: 03-13-1945  Age: 68 Years  PCP: Arnette Norris [facility])  Office Follow Up:  Does the office need to follow up with this patient?: No  Instructions For The Office: N/A   Symptoms  Reason For Call & Symptoms: Pt has called as she "just felt bad" and was seen 07/18/13.  Croupy cough and feels achiness in legs an shoulder blades.   Temp is 100.0 at 1100 and at 1200 it is 99.4 Pt has an appt with Dr. 07/27/13.  Offered pt an appt. But she refused and stated "i just don't want to get out today."  Reviewed Health History In EMR: Yes  Reviewed Medications In EMR: Yes  Reviewed Allergies In EMR: Yes  Reviewed Surgeries / Procedures: Yes  Date of Onset of Symptoms: 07/15/2013  Treatments Tried: Claritin and Ibuprofen  Treatments Tried Worked: No  Any Fever: Yes  Fever Taken: Oral  Fever Time Of Reading: 11:00:00  Fever Last Reading: 100.0  Guideline(s) Used:  Influenza - Seasonal  Colds  Disposition Per Guideline:   Home Care  Reason For Disposition Reached:   Colds with no complications  Advice Given:  Reassurance  It sounds like an uncomplicated cold that we can treat at home.  Colds are very common and may make you feel uncomfortable.  Colds are caused by viruses, and no medicine or "shot" will cure an uncomplicated cold.  Colds are usually not serious.  For a Runny Nose With Profuse Discharge:   Nasal mucus and discharge helps to wash viruses and bacteria out of the nose and sinuses.  Treatment for Associated Symptoms of Colds:  Hydrate: Drink adequate liquids.  Expected Course:   Cough up to 2-3 weeks.  Call Back If:  Fever lasts more than 3 days  Nasal discharge lasts more than 10 days  Cough lasts more than 3 weeks  You become worse  Cough Medicines:  OTC Cough Syrups: The most common cough suppressant in OTC cough medications is dextromethorphan. Often  the letters "DM" appear in the name.  OTC Cough Drops: Cough drops can help a lot, especially for mild coughs. They reduce coughing by soothing your irritated throat and removing that tickle sensation in the back of the throat. Cough drops also have the advantage of portability - you can carry them with you.  Home Remedy - Honey: This old home remedy has been shown to help decrease coughing at night. The adult dosage is 2 teaspoons (10 ml) at bedtime. Honey should not be given to infants under one year of age.  Patient Will Follow Care Advice:  YES

## 2013-07-22 ENCOUNTER — Other Ambulatory Visit: Payer: Self-pay | Admitting: Family Medicine

## 2013-07-22 NOTE — Telephone Encounter (Signed)
Lm on pts vm informing her Rx has been called in to requested pharmacy 

## 2013-07-22 NOTE — Telephone Encounter (Signed)
Pt requesting medication refill. Last ov 12/2012 with f/u appt 07/27/13. pls advise

## 2013-07-27 ENCOUNTER — Other Ambulatory Visit: Payer: Self-pay | Admitting: Family Medicine

## 2013-07-27 ENCOUNTER — Ambulatory Visit (INDEPENDENT_AMBULATORY_CARE_PROVIDER_SITE_OTHER): Payer: Medicare HMO | Admitting: Family Medicine

## 2013-07-27 ENCOUNTER — Encounter: Payer: Self-pay | Admitting: Family Medicine

## 2013-07-27 VITALS — BP 106/66 | HR 72 | Temp 98.4°F | Wt 128.0 lb

## 2013-07-27 DIAGNOSIS — F3289 Other specified depressive episodes: Secondary | ICD-10-CM

## 2013-07-27 DIAGNOSIS — F329 Major depressive disorder, single episode, unspecified: Secondary | ICD-10-CM

## 2013-07-27 DIAGNOSIS — R05 Cough: Secondary | ICD-10-CM | POA: Insufficient documentation

## 2013-07-27 DIAGNOSIS — F4321 Adjustment disorder with depressed mood: Secondary | ICD-10-CM

## 2013-07-27 DIAGNOSIS — R519 Headache, unspecified: Secondary | ICD-10-CM | POA: Insufficient documentation

## 2013-07-27 DIAGNOSIS — F32A Depression, unspecified: Secondary | ICD-10-CM

## 2013-07-27 DIAGNOSIS — R059 Cough, unspecified: Secondary | ICD-10-CM

## 2013-07-27 DIAGNOSIS — R51 Headache: Secondary | ICD-10-CM

## 2013-07-27 NOTE — Assessment & Plan Note (Signed)
Stable on current rx. She does not want wean off at this time. No changes.

## 2013-07-27 NOTE — Patient Instructions (Signed)
We will call you with your MRI/MRA appointment.  Have your pharmacy send me a prior auth for your lexapro.

## 2013-07-27 NOTE — Progress Notes (Signed)
Pre visit review using our clinic review tool, if applicable. No additional management support is needed unless otherwise documented below in the visit note. 

## 2013-07-27 NOTE — Progress Notes (Signed)
Subjective:    Patient ID: [name], female    DOB: 11/14/1945, 68 y.o.   MRN: [id number]  HPI I  nsomnia/depression-  Pt presents to the clinic today to f/u insomnia and depression. This started after she unexpectedly lost her husband in 06/2012.   She has been using Ambien at night to help her sleep  Also taking Lexapro 20 mg daily.    She does have a supportive family. She spends lots of time with her kids and grand kids which helps distract her.   Feels lexapro is really helping at this point.  Appetite improved. Wt Readings from Last 3 Encounters:  07/27/13 128 lb (58.06 kg)  07/18/13 127 lb 8 oz (57.834 kg)  06/27/13 125 lb 8 oz (56.926 kg)   Strange sensation in her head- for past month, having shooting pain in head- frontal and occipital multiple times per day.  Becoming more frequent.  Feels "different" when she is having these but cannot explain how.  Resolve on their own within seconds to minutes.  No rhinorrhea, dysphagia, blurred vision or other focal neurological issues.  Patient Active Problem List   Diagnosis Date Noted  . Cough 07/27/2013  . Headache(784.0) 07/27/2013  . CTS (carpal tunnel syndrome) 04/11/2013  . Depressed 01/05/2013  . Insomnia 07/12/2012  . Grief reaction 07/12/2012  . HIATAL HERNIA 10/08/2007  . COLONIC POLYPS, HYPERPLASTIC, HX OF 10/08/2007  . CARDIOMYOPATHY 06/22/2007  . EROSIVE ESOPHAGITIS 06/22/2007  . GERD 06/22/2007  . HYPERCHOLESTEROLEMIA, PURE 08/11/2006   Past Medical History  Diagnosis Date  . Migraine   . Cardiomyopathy     nonischemic  . GERD (gastroesophageal reflux disease)   . Erosive esophagitis     LA Classification Grade B  . Hyperlipidemia   . Osteopenia   . Hiatal hernia   . Melanoma     basil cell  . Diabetes mellitus     pt states she is not diabetic  . Sessile colonic polyp 10/2011    adenoma   Past Surgical History  Procedure Laterality Date  . Tubal ligation    . Vaginal hysterectomy    .  Appendectomy     History  Substance Use Topics  . Smoking status: Former Smoker    Quit date: 06/03/1981  . Smokeless tobacco: Never Used  . Alcohol Use: 1.8 oz/week    3 Glasses of wine per week     Comment: Wine on occasional   Family History  Problem Relation Age of Onset  . Heart disease Father   . Alzheimer's disease Mother   . Colon cancer Neg Hx   . Stomach cancer Neg Hx   . Rectal cancer Neg Hx   . Esophageal cancer Neg Hx   . Arthritis/Rheumatoid Sister    Allergies  Allergen Reactions  . Penicillins Hives  . Lansoprazole     REACTION: headaches  . Sulfa Antibiotics Hives  . Ceclor [Cefaclor] Itching and Rash  . Ceftin [Cefuroxime Axetil] Rash  . Clindamycin/Lincomycin Itching and Rash  . Latex Itching and Rash  . Septra [Sulfamethoxazole-Trimethoprim] Rash   Current Outpatient Prescriptions on File Prior to Visit  Medication Sig Dispense Refill  . amLODipine (NORVASC) 2.5 MG tablet Take 1 tablet (2.5 mg total) by mouth daily.  90 tablet  3  . Calcium Carbonate-Vitamin D (CALCIUM 600 + D PO) Take 1 tablet by mouth 2 (two) times daily.      . carvedilol (COREG) 12.5 MG tablet Take 1 tablet (12.5 mg  total) by mouth 2 (two) times daily with a meal.  180 tablet  3  . enalapril (VASOTEC) 10 MG tablet Take 1 tablet (10 mg total) by mouth daily.  90 tablet  3  . escitalopram (LEXAPRO) 20 MG tablet Take 1 tablet (20 mg total) by mouth daily.  30 tablet  0  . HYDROcodone-homatropine (HYCODAN) 5-1.5 MG/5ML syrup Take 5 mLs by mouth every 8 (eight) hours as needed for cough.  120 mL  0  . Multiple Vitamin (MULTIVITAMIN) capsule Take 1 capsule by mouth daily.        . nitroGLYCERIN (NITROSTAT) 0.4 MG SL tablet Place 1 tablet (0.4 mg total) under the tongue every 5 (five) minutes as needed for chest pain.  24 tablet  1  . pantoprazole (PROTONIX) 40 MG tablet Take 1 tablet (40 mg total) by mouth 2 (two) times daily.  180 tablet  2  . promethazine (PHENERGAN) 12.5 MG tablet Take  1 tablet (12.5 mg total) by mouth every 8 (eight) hours as needed for nausea or vomiting.  20 tablet  0  . simvastatin (ZOCOR) 20 MG tablet Take 1 tablet (20 mg total) by mouth at bedtime.  90 tablet  3  . zolpidem (AMBIEN) 10 MG tablet TAKE ONE TABLET AT BEDTIME  30 tablet  0   No current facility-administered medications on file prior to visit.   The PMH, PSH, Social History, Family History, Medications, and allergies have been reviewed in [facility], and have been updated if relevant.  Review of Systems    See HPI  Past Medical History  Diagnosis Date  . Migraine   . Cardiomyopathy     nonischemic  . GERD (gastroesophageal reflux disease)   . Erosive esophagitis     LA Classification Grade B  . Hyperlipidemia   . Osteopenia   . Hiatal hernia   . Melanoma     basil cell  . Diabetes mellitus     pt states she is not diabetic  . Sessile colonic polyp 10/2011    adenoma    Current Outpatient Prescriptions  Medication Sig Dispense Refill  . amLODipine (NORVASC) 2.5 MG tablet Take 1 tablet (2.5 mg total) by mouth daily.  90 tablet  3  . Calcium Carbonate-Vitamin D (CALCIUM 600 + D PO) Take 1 tablet by mouth 2 (two) times daily.      . carvedilol (COREG) 12.5 MG tablet Take 1 tablet (12.5 mg total) by mouth 2 (two) times daily with a meal.  180 tablet  3  . enalapril (VASOTEC) 10 MG tablet Take 1 tablet (10 mg total) by mouth daily.  90 tablet  3  . escitalopram (LEXAPRO) 20 MG tablet Take 1 tablet (20 mg total) by mouth daily.  30 tablet  0  . HYDROcodone-homatropine (HYCODAN) 5-1.5 MG/5ML syrup Take 5 mLs by mouth every 8 (eight) hours as needed for cough.  120 mL  0  . Multiple Vitamin (MULTIVITAMIN) capsule Take 1 capsule by mouth daily.        . nitroGLYCERIN (NITROSTAT) 0.4 MG SL tablet Place 1 tablet (0.4 mg total) under the tongue every 5 (five) minutes as needed for chest pain.  24 tablet  1  . pantoprazole (PROTONIX) 40 MG tablet Take 1 tablet (40 mg total) by mouth 2 (two)  times daily.  180 tablet  2  . promethazine (PHENERGAN) 12.5 MG tablet Take 1 tablet (12.5 mg total) by mouth every 8 (eight) hours as needed for nausea or vomiting.  20 tablet  0  . simvastatin (ZOCOR) 20 MG tablet Take 1 tablet (20 mg total) by mouth at bedtime.  90 tablet  3  . zolpidem (AMBIEN) 10 MG tablet TAKE ONE TABLET AT BEDTIME  30 tablet  0   No current facility-administered medications for this visit.    Allergies  Allergen Reactions  . Penicillins Hives  . Lansoprazole     REACTION: headaches  . Sulfa Antibiotics Hives  . Ceclor [Cefaclor] Itching and Rash  . Ceftin [Cefuroxime Axetil] Rash  . Clindamycin/Lincomycin Itching and Rash  . Latex Itching and Rash  . Septra [Sulfamethoxazole-Trimethoprim] Rash    Family History  Problem Relation Age of Onset  . Heart disease Father   . Alzheimer's disease Mother   . Colon cancer Neg Hx   . Stomach cancer Neg Hx   . Rectal cancer Neg Hx   . Esophageal cancer Neg Hx   . Arthritis/Rheumatoid Sister     History   Social History  . Marital Status: Married    Spouse Name: N/A    Number of Children: 3  . Years of Education: N/A   Occupational History  . Housewife    Social History Main Topics  . Smoking status: Former Smoker    Quit date: 06/03/1981  . Smokeless tobacco: Never Used  . Alcohol Use: 1.8 oz/week    3 Glasses of wine per week     Comment: Wine on occasional  . Drug Use: No  . Sexual Activity: Not on file   Other Topics Concern  . Not on file   Social History Narrative   Lives alone in two-story home.     Her husband recently passed away unexpectedly.  They have three children.     Constitutional: Denies fever, malaise, fatigue, headache or abrupt weight changes.  Psych: Pt reports insomnia and depression. Denies anxiety, SI/HI.  No other specific complaints in a complete review of systems (except as listed in HPI above).  Objective:   Physical Exam   BP 106/66  Pulse 72  Temp(Src)  98.4 F (36.9 C) (Oral)  Wt 128 lb (58.06 kg)  SpO2 98% Wt Readings from Last 3 Encounters:  07/27/13 128 lb (58.06 kg)  07/18/13 127 lb 8 oz (57.834 kg)  06/27/13 125 lb 8 oz (56.926 kg)    General: Appears her stated age, well developed, well nourished in NAD. Cardiovascular: Normal rate and rhythm. S1,S2 noted.  No murmur, rubs or gallops noted. No JVD or BLE edema. No carotid bruits noted. Pulmonary/Chest: Normal effort and positive vesicular breath sounds. No respiratory distress. No wheezes, rales or ronchi noted.  Psychiatric: Mood tearful today and affect normal. Behavior is normal. Judgment and thought content normal.  Neuro:  CNII- XII intact, normal reflexes, normal gait.        Assessment & Plan:

## 2013-07-27 NOTE — Assessment & Plan Note (Addendum)
?  occipital neuralgia although not classic distribution. Atypical migraine also on differential but will order MRI/MRA first to rule out other pathology given progression of symptoms over 1 month. The patient indicates understanding of these issues and agrees with the plan.

## 2013-08-03 ENCOUNTER — Encounter: Payer: Self-pay | Admitting: Family Medicine

## 2013-08-03 ENCOUNTER — Other Ambulatory Visit: Payer: Self-pay | Admitting: Family Medicine

## 2013-08-03 ENCOUNTER — Other Ambulatory Visit: Payer: Self-pay | Admitting: Internal Medicine

## 2013-08-03 NOTE — Telephone Encounter (Signed)
Pt requesting medication refill. Last ov 07/2013 with no future appts scheduled. pls advise 

## 2013-08-03 NOTE — Telephone Encounter (Signed)
Spoke to pharmacy who confirms Rx called in to pharmacy 07/22/13. She indicates refill is automatically generated by computer

## 2013-08-04 NOTE — Telephone Encounter (Signed)
Last office visit was 07/27/13--please advise

## 2013-08-05 ENCOUNTER — Other Ambulatory Visit: Payer: Medicare HMO

## 2013-08-08 NOTE — Addendum Note (Signed)
Addended by: Modena Nunnery on: 08/08/2013 03:20 PM   Modules accepted: Orders

## 2013-08-09 ENCOUNTER — Ambulatory Visit
Admission: RE | Admit: 2013-08-09 | Discharge: 2013-08-09 | Disposition: A | Discharge status: Home / Self Care | Payer: Medicare HMO | Source: Ambulatory Visit | Attending: Family Medicine | Admitting: Family Medicine

## 2013-08-09 ENCOUNTER — Ambulatory Visit
Admission: RE | Admit: 2013-08-09 | Discharge: 2013-08-09 | Disposition: A | Discharge status: Home / Self Care | Payer: Commercial Managed Care - HMO | Source: Ambulatory Visit | Attending: Family Medicine | Admitting: Family Medicine

## 2013-08-09 DIAGNOSIS — R51 Headache: Secondary | ICD-10-CM

## 2013-08-10 ENCOUNTER — Encounter: Payer: Self-pay | Admitting: Family Medicine

## 2013-08-18 ENCOUNTER — Other Ambulatory Visit: Payer: Self-pay | Admitting: Family Medicine

## 2013-08-18 NOTE — Telephone Encounter (Signed)
Pt requesting medication refill. Last f/u appt 07/2013. pls advise 

## 2013-08-19 NOTE — Telephone Encounter (Signed)
Spoke to pt and informed her Rx has been called in to requested pharmacy 

## 2013-09-14 ENCOUNTER — Encounter: Payer: Self-pay | Admitting: Gastroenterology

## 2013-09-14 ENCOUNTER — Ambulatory Visit (INDEPENDENT_AMBULATORY_CARE_PROVIDER_SITE_OTHER): Payer: Commercial Managed Care - HMO | Admitting: Gastroenterology

## 2013-09-14 VITALS — BP 100/60 | HR 72 | Ht 62.0 in | Wt 128.8 lb

## 2013-09-14 DIAGNOSIS — Z86010 Personal history of colonic polyps: Secondary | ICD-10-CM

## 2013-09-14 DIAGNOSIS — K2100 Gastro-esophageal reflux disease with esophagitis, without bleeding: Secondary | ICD-10-CM

## 2013-09-14 MED ORDER — PANTOPRAZOLE SODIUM 40 MG PO TBEC
40.0000 mg | DELAYED_RELEASE_TABLET | Freq: Two times a day (BID) | ORAL | Status: DC
Start: 2013-09-14 — End: 2014-09-25

## 2013-09-14 NOTE — Progress Notes (Signed)
    History of Present Illness: This is a 68 year old female with a history of GERD and erosive esophagitis. Her symptoms are well controlled on pantoprazole 40 mg twice daily. She uses Pepcid AC about 2 times per week for breakthrough symptoms and this regimen seems to work well for her. She has no other gastrointestinal complaints. Her husband passed away suddenly in 2012/07/06 after he sustained a fall and head injury. She states she's been under increased stress since this time.  Current Medications, Allergies, Past Medical History, Past Surgical History, Family History and Social History were reviewed in [employer] record.  Physical Exam: General: Well developed , well nourished, no acute distress Head: Normocephalic and atraumatic Eyes:  sclerae anicteric, EOMI Ears: Normal auditory acuity Mouth: No deformity or lesions Lungs: Clear throughout to auscultation Heart: Regular rate and rhythm; no murmurs, rubs or bruits Abdomen: Soft, non tender and non distended. No masses, hepatosplenomegaly or hernias noted. Normal Bowel sounds Musculoskeletal: Symmetrical with no gross deformities  Pulses:  Normal pulses noted Extremities: No clubbing, cyanosis, edema or deformities noted Neurological: Alert oriented x 4, grossly nonfocal Psychological:  Alert and cooperative. Normal mood and affect  Assessment and Recommendations:  1. GERD. Continue pantoprazole 40 mg twice daily. May use Pepcid AC, TUMS and/or Maalox for breakthrough symptoms.  2. Personal history of adenomatous colon polyps, initial diagnosis August 2013. Surveillance colonoscopy August 2018.

## 2013-09-14 NOTE — Patient Instructions (Signed)
We have sent the following medications to your pharmacy for you to pick up at your convenience: Pantoprazole.  Thank you for choosing me and [facility].  Pricilla Riffle. Dagoberto Ligas., MD., Marval Regal

## 2013-09-26 ENCOUNTER — Other Ambulatory Visit: Payer: Self-pay | Admitting: Family Medicine

## 2013-09-26 NOTE — Telephone Encounter (Signed)
Spoke to pt and informed her Rx has been faxed to requested pharmacy 

## 2013-09-26 NOTE — Telephone Encounter (Signed)
Pt requesting medication refill. Last f/u appt 07/2013 with no future appts scheduled. pls advise 

## 2013-11-03 ENCOUNTER — Ambulatory Visit (INDEPENDENT_AMBULATORY_CARE_PROVIDER_SITE_OTHER): Payer: Commercial Managed Care - HMO | Admitting: Internal Medicine

## 2013-11-03 ENCOUNTER — Encounter: Payer: Self-pay | Admitting: Internal Medicine

## 2013-11-03 VITALS — BP 120/60 | HR 73 | Temp 97.7°F | Wt 131.0 lb

## 2013-11-03 DIAGNOSIS — S8392XA Sprain of unspecified site of left knee, initial encounter: Secondary | ICD-10-CM

## 2013-11-03 DIAGNOSIS — IMO0002 Reserved for concepts with insufficient information to code with codable children: Secondary | ICD-10-CM

## 2013-11-03 NOTE — Assessment & Plan Note (Signed)
No internal derangement Reassured --doesn't need ortho  Try heat---unless throbbing after use Ibuprofen tid Dr Lorelei Pont if persists

## 2013-11-03 NOTE — Progress Notes (Signed)
Pre visit review using our clinic review tool, if applicable. No additional management support is needed unless otherwise documented below in the visit note. 

## 2013-11-03 NOTE — Progress Notes (Signed)
Subjective:    Patient ID: [name], female    DOB: 04-15-1945, 68 y.o.   MRN: [id number]  HPI Has been having trouble with her right knee for 3-4 weeks Pain along medial side Using ice 3-4 times a day Ibuprofen 400mg  bid Still with throbbing pain  First noticed pain getting out of chair on vacation--no clear injury No swelling  Able to walk but has pain Has tried knee brace--elastic. No obvious help  Current Outpatient Prescriptions on File Prior to Visit  Medication Sig Dispense Refill  . amLODipine (NORVASC) 2.5 MG tablet Take 1 tablet (2.5 mg total) by mouth daily.  90 tablet  3  . Calcium Carbonate-Vitamin D (CALCIUM 600 + D PO) Take 1 tablet by mouth 2 (two) times daily.      . carvedilol (COREG) 12.5 MG tablet Take 1 tablet (12.5 mg total) by mouth 2 (two) times daily with a meal.  180 tablet  3  . enalapril (VASOTEC) 10 MG tablet Take 1 tablet (10 mg total) by mouth daily.  90 tablet  3  . escitalopram (LEXAPRO) 20 MG tablet TAKE ONE TABLET BY MOUTH EVERY DAY  30 tablet  6  . Multiple Vitamin (MULTIVITAMIN) capsule Take 1 capsule by mouth daily.        . nitroGLYCERIN (NITROSTAT) 0.4 MG SL tablet Place 1 tablet (0.4 mg total) under the tongue every 5 (five) minutes as needed for chest pain.  24 tablet  1  . pantoprazole (PROTONIX) 40 MG tablet Take 1 tablet (40 mg total) by mouth 2 (two) times daily.  180 tablet  3  . promethazine (PHENERGAN) 12.5 MG tablet Take 1 tablet (12.5 mg total) by mouth every 8 (eight) hours as needed for nausea or vomiting.  20 tablet  0  . simvastatin (ZOCOR) 20 MG tablet Take 1 tablet (20 mg total) by mouth at bedtime.  90 tablet  3  . zolpidem (AMBIEN) 10 MG tablet TAKE ONE TABLET BY MOUTH AT BEDTIME AS NEEDED  30 tablet  0   No current facility-administered medications on file prior to visit.    Allergies  Allergen Reactions  . Penicillins Hives  . Lansoprazole     REACTION: headaches  . Sulfa Antibiotics Hives  . Ceclor  [Cefaclor] Itching and Rash  . Ceftin [Cefuroxime Axetil] Rash  . Clindamycin/Lincomycin Itching and Rash  . Latex Itching and Rash  . Septra [Sulfamethoxazole-Trimethoprim] Rash    Past Medical History  Diagnosis Date  . Migraine   . Cardiomyopathy     nonischemic  . GERD (gastroesophageal reflux disease)   . Erosive esophagitis     LA Classification Grade B  . Hyperlipidemia   . Osteopenia   . Hiatal hernia   . Melanoma     basil cell  . Diabetes mellitus     pt states she is not diabetic  . Sessile colonic polyp 10/2011    adenoma    Past Surgical History  Procedure Laterality Date  . Tubal ligation    . Vaginal hysterectomy    . Appendectomy      Family History  Problem Relation Age of Onset  . Heart disease Father   . Alzheimer's disease Mother   . Colon cancer Neg Hx   . Stomach cancer Neg Hx   . Rectal cancer Neg Hx   . Esophageal cancer Neg Hx   . Arthritis/Rheumatoid Sister     History   Social History  . Marital Status: Married  Spouse Name: N/A    Number of Children: 3  . Years of Education: N/A   Occupational History  . Housewife    Social History Main Topics  . Smoking status: Former Smoker    Quit date: 06/03/1981  . Smokeless tobacco: Never Used  . Alcohol Use: 1.8 oz/week    3 Glasses of wine per week     Comment: Wine on occasional  . Drug Use: No  . Sexual Activity: Not on file   Other Topics Concern  . Not on file   Social History Narrative   Lives alone in two-story home.     Her husband recently passed away unexpectedly.  They have three children.    Review of Systems No fever No redness No other joint problems    Objective:   Physical Exam  Constitutional: She appears well-developed and well-nourished. No distress.  Musculoskeletal:  No right knee swelling No meniscus or ligament findings Mild tenderness by medial knee but no MCL laxity Normal ROM          Assessment & Plan:

## 2013-12-12 ENCOUNTER — Other Ambulatory Visit: Payer: Self-pay | Admitting: Family Medicine

## 2013-12-12 NOTE — Telephone Encounter (Signed)
Pt requesting medication refill. Last f/u appt 07/2013. Ok to fill per Dr Deborra Medina. Rx to be faxed to requested pharmacy before end of day, today.

## 2014-01-03 ENCOUNTER — Other Ambulatory Visit: Payer: Self-pay | Admitting: Internal Medicine

## 2014-01-23 ENCOUNTER — Other Ambulatory Visit: Payer: Self-pay | Admitting: Family Medicine

## 2014-01-23 NOTE — Telephone Encounter (Signed)
Pt requesting medication refill. Last f/u appt 07/2013. pls advise 

## 2014-01-23 NOTE — Telephone Encounter (Signed)
Rx faxed to requested pharmacy 

## 2014-02-22 ENCOUNTER — Other Ambulatory Visit: Payer: Self-pay | Admitting: Family Medicine

## 2014-02-22 NOTE — Telephone Encounter (Signed)
Rx called in to requested pharmacy 

## 2014-02-22 NOTE — Telephone Encounter (Signed)
Pt requesting medication refill. Last f/u appt 07/2013. pls advise 

## 2014-02-24 ENCOUNTER — Ambulatory Visit (INDEPENDENT_AMBULATORY_CARE_PROVIDER_SITE_OTHER): Payer: Commercial Managed Care - HMO | Admitting: Internal Medicine

## 2014-02-24 ENCOUNTER — Encounter: Payer: Self-pay | Admitting: Internal Medicine

## 2014-02-24 VITALS — BP 100/60 | HR 62 | Ht 62.0 in | Wt 130.8 lb

## 2014-02-24 DIAGNOSIS — E7800 Pure hypercholesterolemia, unspecified: Secondary | ICD-10-CM

## 2014-02-24 LAB — BASIC METABOLIC PANEL
BUN: 14 mg/dL (ref 6–23)
CO2: 26 mEq/L (ref 19–32)
Calcium: 9.6 mg/dL (ref 8.4–10.5)
Chloride: 103 mEq/L (ref 96–112)
Creatinine, Ser: 0.7 mg/dL (ref 0.4–1.2)
GFR: 95.9 mL/min (ref >60.00)
Glucose, Bld: 102 mg/dL — ABNORMAL HIGH (ref 70–99)
Potassium: 4.1 mEq/L (ref 3.5–5.1)
SODIUM: 137 meq/L (ref 135–145)

## 2014-02-24 LAB — LIPID PANEL
Cholesterol: 147 mg/dL (ref 0–200)
HDL: 47.4 mg/dL (ref >39.00)
LDL Cholesterol: 70 mg/dL (ref 0–99)
NonHDL: 99.6
Total CHOL/HDL Ratio: 3
Triglycerides: 149 mg/dL (ref 0.0–149.0)
VLDL: 29.8 mg/dL (ref 0.0–40.0)

## 2014-02-24 LAB — CBC
HCT: 38.8 % (ref 36.0–46.0)
HEMOGLOBIN: 13 g/dL (ref 12.0–15.0)
MCHC: 33.4 g/dL (ref 30.0–36.0)
MCV: 87.4 fl (ref 78.0–100.0)
PLATELETS: 242 10*3/uL (ref 150.0–400.0)
RBC: 4.44 Mil/uL (ref 3.87–5.11)
RDW: 13.3 % (ref 11.5–15.5)
WBC: 6 10*3/uL (ref 4.0–10.5)

## 2014-02-24 LAB — AST: AST: 20 U/L (ref 0–37)

## 2014-02-24 MED ORDER — AMLODIPINE BESYLATE 2.5 MG PO TABS
2.5000 mg | ORAL_TABLET | Freq: Every day | ORAL | Status: DC
Start: 2014-02-24 — End: 2014-02-24

## 2014-02-24 MED ORDER — PROMETHAZINE HCL 12.5 MG PO TABS: 12.5000 mg | ORAL_TABLET | Freq: Three times a day (TID) | ORAL | Status: DC | PRN

## 2014-02-24 MED ORDER — AMLODIPINE BESYLATE 2.5 MG PO TABS: 2.5000 mg | ORAL_TABLET | Freq: Every day | ORAL | Status: DC

## 2014-02-24 NOTE — Patient Instructions (Signed)
Your physician recommends that you continue on your current medications as directed. Please refer to the Current Medication list given to you today. Your physician recommends that you return for lab work in: today (cbc, bmet, ast, lipid) Your physician wants you to follow-up in: 1 year with Dr. Harrington Challenger.  You will receive a reminder letter in the mail two months in advance. If you don't receive a letter, please call our office to schedule the follow-up appointment.

## 2014-02-24 NOTE — Progress Notes (Signed)
Patient ID: [name], female   DOB: 1945/07/18, 68 y.o.   MRN: [id number] Patient is a 68 yo with a history of LBBB, NICM (normalization of LVEF)  Also a history of CP (normal myoview in Jan 2014) and significant GERD I saw her 1 year ago   From a cardiac standpoint she has done well  She is very active  Denies CP  Breathing is OK   Denies CP  No palpitations.        PE BP 10/60   P 62 Patient is in NAD HEENT: normal Neck JVP normal no bruits no thyromegaly Lungs CTA Heart:  S1/S2 no murmur, no rub, gallop or click PMI normal Abdomen: benign, BS positve, no hepatomegaly Distal pulses intact with no bruits No edema Neuro non-focal Skin warm and dry   Current Outpatient Prescriptions  Medication Sig Dispense Refill  . amLODipine (NORVASC) 2.5 MG tablet Take 1 tablet (2.5 mg total) by mouth daily. 90 tablet 3  . carvedilol (COREG) 12.5 MG tablet Take 1 tablet (12.5 mg total) by mouth 2 (two) times daily with a meal. 180 tablet 3  . enalapril (VASOTEC) 10 MG tablet Take 1 tablet (10 mg total) by mouth daily. 90 tablet 3  . escitalopram (LEXAPRO) 20 MG tablet TAKE ONE TABLET BY MOUTH EVERY DAY 30 tablet 6  . Multiple Vitamin (MULTIVITAMIN) capsule Take 1 capsule by mouth daily.      . pantoprazole (PROTONIX) 40 MG tablet Take 1 tablet (40 mg total) by mouth 2 (two) times daily. 180 tablet 3  . simvastatin (ZOCOR) 20 MG tablet Take 1 tablet (20 mg total) by mouth at bedtime. 90 tablet 3  . simvastatin (ZOCOR) 20 MG tablet TAKE ONE TABLET AT BEDTIME 90 tablet 0  . zolpidem (AMBIEN) 10 MG tablet TAKE ONE TABLET BY MOUTH AT BEDTIME 30 tablet 0  . Calcium Carbonate-Vitamin D (CALCIUM 600 + D PO) Take 1 tablet by mouth 2 (two) times daily.    . nitroGLYCERIN (NITROSTAT) 0.4 MG SL tablet Place 1 tablet (0.4 mg total) under the tongue every 5 (five) minutes as needed for chest pain. (Patient not taking: Reported on 02/24/2014) 24 tablet 1  . promethazine (PHENERGAN) 12.5 MG tablet Take 1  tablet (12.5 mg total) by mouth every 8 (eight) hours as needed for nausea or vomiting. (Patient not taking: Reported on 02/24/2014) 20 tablet 0   No current facility-administered medications for this visit.    Allergies  Penicillins; Lansoprazole; Sulfa antibiotics; Ceclor; Ceftin; Clindamycin/lincomycin; Latex; and Septra  Electrocardiogram:  SR LBBB rate 62  Assessment and Plan  1.  NICM  LVEF has normalized  Doing well    2.  Hx CP  Patient denies    3.  GERD  Patient remains asymptomatic   5.  Ortho>  Given rec for PT and hand ortho  .

## 2014-02-28 ENCOUNTER — Telehealth: Payer: Self-pay | Admitting: Internal Medicine

## 2014-02-28 ENCOUNTER — Other Ambulatory Visit: Payer: Self-pay | Admitting: Family Medicine

## 2014-02-28 NOTE — Telephone Encounter (Signed)
New Msg      Pt returning call about lab results. Please call.

## 2014-02-28 NOTE — Telephone Encounter (Signed)
Patient made aware of lab results.

## 2014-03-23 ENCOUNTER — Other Ambulatory Visit: Payer: Self-pay | Admitting: Family Medicine

## 2014-03-23 DIAGNOSIS — C44329 Squamous cell carcinoma of skin of other parts of face: Secondary | ICD-10-CM | POA: Diagnosis not present

## 2014-03-23 DIAGNOSIS — L570 Actinic keratosis: Secondary | ICD-10-CM | POA: Diagnosis not present

## 2014-03-23 NOTE — Addendum Note (Signed)
Addended by: Modena Nunnery on: 03/23/2014 09:33 AM   Modules accepted: Orders

## 2014-03-23 NOTE — Telephone Encounter (Signed)
Pt requesting medication refill. Pt has not had recent f/u appt but sched f/u for 1/19, pls advise

## 2014-03-24 MED ORDER — ZOLPIDEM TARTRATE 10 MG PO TABS: 10.0000 mg | ORAL_TABLET | Freq: Every day | ORAL | Status: DC

## 2014-03-24 NOTE — Telephone Encounter (Signed)
Rx called in to requested pharmacy 

## 2014-03-28 ENCOUNTER — Ambulatory Visit: Payer: Commercial Managed Care - HMO | Admitting: Family Medicine

## 2014-04-03 ENCOUNTER — Other Ambulatory Visit: Payer: Self-pay | Admitting: Family Medicine

## 2014-04-04 ENCOUNTER — Other Ambulatory Visit: Payer: Self-pay | Admitting: Internal Medicine

## 2014-04-06 ENCOUNTER — Ambulatory Visit (INDEPENDENT_AMBULATORY_CARE_PROVIDER_SITE_OTHER): Payer: Commercial Managed Care - HMO | Admitting: Family Medicine

## 2014-04-06 ENCOUNTER — Encounter: Payer: Self-pay | Admitting: Family Medicine

## 2014-04-06 VITALS — BP 128/70 | HR 65 | Temp 98.0°F | Wt 133.5 lb

## 2014-04-06 DIAGNOSIS — G5602 Carpal tunnel syndrome, left upper limb: Secondary | ICD-10-CM

## 2014-04-06 DIAGNOSIS — F329 Major depressive disorder, single episode, unspecified: Secondary | ICD-10-CM | POA: Diagnosis not present

## 2014-04-06 DIAGNOSIS — F32A Depression, unspecified: Secondary | ICD-10-CM

## 2014-04-06 DIAGNOSIS — G5603 Carpal tunnel syndrome, bilateral upper limbs: Secondary | ICD-10-CM

## 2014-04-06 DIAGNOSIS — G4700 Insomnia, unspecified: Secondary | ICD-10-CM

## 2014-04-06 DIAGNOSIS — G5601 Carpal tunnel syndrome, right upper limb: Secondary | ICD-10-CM | POA: Diagnosis not present

## 2014-04-06 MED ORDER — TRAZODONE HCL 50 MG PO TABS: 25.0000 mg | ORAL_TABLET | Freq: Every evening | ORAL | Status: DC | PRN

## 2014-04-06 MED ORDER — ESCITALOPRAM OXALATE 20 MG PO TABS: 20.0000 mg | ORAL_TABLET | Freq: Every day | ORAL | Status: DC

## 2014-04-06 NOTE — Progress Notes (Signed)
Pre visit review using our clinic review tool, if applicable. No additional management support is needed unless otherwise documented below in the visit note. 

## 2014-04-06 NOTE — Assessment & Plan Note (Signed)
Deteriorated. Refer to Dr. Amedeo Plenty to discuss further tx options. The patient indicates understanding of these issues and agrees with the plan.

## 2014-04-06 NOTE — Assessment & Plan Note (Signed)
Well controlled on current dose of lexapro. Will attempt wean in future but she feels now is not a good time.

## 2014-04-06 NOTE — Assessment & Plan Note (Signed)
Persistent issue.  >25 min spent with patient, at least half of which was spent on counseling insomnia.  The problem of recurrent insomnia is discussed. Avoidance of caffeine sources is strongly encouraged. Sleep hygiene issues are reviewed. The use of sedative hypnotics for temporary relief is appropriate; we discussed the addictive nature of these drugs, and a one-time only prescription for prn use of a hypnotic is given, to use no more than 3 times per week for 2-3 weeks. Will also restart Trazodone now that she is now in acute phase of grief- may be more effective. Call or return to clinic prn if these symptoms worsen or fail to improve as anticipated. The patient indicates understanding of these issues and agrees with the plan.

## 2014-04-06 NOTE — Progress Notes (Signed)
Subjective:    Patient ID: [name], female    DOB: 1945/12/17, 69 y.o.   MRN: [id number]  HPI  Pt presents to the clinic today to f/u insomnia and depression. This started after she unexpectedly lost her husband in 06/2012.   She feels lexapro 20 mg daily is working well and feels she needs to continue this.  Still working on getting the house ready to sell and feels the lexapro is helping her to cope better with that process.  Ran out of ambien- not sleeping well.  Tried trazodone shortly after her husband passed away.  It was not effective so we started prn ambien.  Spends a lot of time back and forth from [place] spending time with her kids and grandkids which she loves.      Bilateral carpal tunnel- referred her to neuro, Dr. Posey Pronto.  Neg EMG- reassuring that paresthesias not being caused by neuro disease.  Wrist splints not well tolerated.  Wants ortho referral to consider injections, CT release.    Review of Systems  Constitutional: Negative.   Musculoskeletal: Negative.   Skin: Negative.   Neurological: Negative.   Psychiatric/Behavioral: Negative.   All other systems reviewed and are negative.       Past Medical History  Diagnosis Date  . Migraine   . Cardiomyopathy     nonischemic  . GERD (gastroesophageal reflux disease)   . Erosive esophagitis     LA Classification Grade B  . Hyperlipidemia   . Osteopenia   . Hiatal hernia   . Melanoma     basil cell  . Diabetes mellitus     pt states she is not diabetic  . Sessile colonic polyp 10/2011    adenoma    Current Outpatient Prescriptions  Medication Sig Dispense Refill  . amLODipine (NORVASC) 2.5 MG tablet Take 1 tablet (2.5 mg total) by mouth daily. 90 tablet 3  . Calcium Carbonate-Vitamin D (CALCIUM 600 + D PO) Take 1 tablet by mouth 2 (two) times daily.    . carvedilol (COREG) 12.5 MG tablet Take 1 tablet (12.5 mg total) by mouth 2 (two) times daily with a meal. 180 tablet 3  . enalapril (VASOTEC)  10 MG tablet Take 1 tablet (10 mg total) by mouth daily. 90 tablet 3  . escitalopram (LEXAPRO) 20 MG tablet TAKE ONE TABLET EVERY DAY 30 tablet 0  . Multiple Vitamin (MULTIVITAMIN) capsule Take 1 capsule by mouth daily.      . pantoprazole (PROTONIX) 40 MG tablet Take 1 tablet (40 mg total) by mouth 2 (two) times daily. 180 tablet 3  . promethazine (PHENERGAN) 12.5 MG tablet Take 1 tablet (12.5 mg total) by mouth every 8 (eight) hours as needed for nausea or vomiting. 20 tablet 0  . simvastatin (ZOCOR) 20 MG tablet TAKE ONE TABLET BY MOUTH AT BEDTIME 90 tablet 2  . zolpidem (AMBIEN) 10 MG tablet Take 1 tablet (10 mg total) by mouth at bedtime. 30 tablet 0   No current facility-administered medications for this visit.    Allergies  Allergen Reactions  . Penicillins Hives  . Lansoprazole     REACTION: headaches  . Sulfa Antibiotics Hives  . Ceclor [Cefaclor] Itching and Rash  . Ceftin [Cefuroxime Axetil] Rash  . Clindamycin/Lincomycin Itching and Rash  . Latex Itching and Rash  . Septra [Sulfamethoxazole-Trimethoprim] Rash    Family History  Problem Relation Age of Onset  . Heart disease Father   . Alzheimer's disease Mother   .  Colon cancer Neg Hx   . Stomach cancer Neg Hx   . Rectal cancer Neg Hx   . Esophageal cancer Neg Hx   . Arthritis/Rheumatoid Sister     History   Social History  . Marital Status: Married    Spouse Name: N/A    Number of Children: 3  . Years of Education: N/A   Occupational History  . Housewife    Social History Main Topics  . Smoking status: Former Smoker    Quit date: 06/03/1981  . Smokeless tobacco: Never Used  . Alcohol Use: 1.8 oz/week    3 Glasses of wine per week     Comment: Wine on occasional  . Drug Use: No  . Sexual Activity: Not on file   Other Topics Concern  . Not on file   Social History Narrative   Lives alone in two-story home.     Her husband recently passed away unexpectedly.  They have three children.        Objective:   Physical Exam  Constitutional: She is oriented to person, place, and time. She appears well-developed and well-nourished. No distress.  HENT:  Head: Normocephalic.  Eyes: Conjunctivae are normal.  Cardiovascular: Normal rate and regular rhythm.   Pulmonary/Chest: Effort normal and breath sounds normal.  Musculoskeletal: Normal range of motion.  Neurological: She is alert and oriented to person, place, and time. No cranial nerve deficit.  Skin: Skin is warm and dry.  Psychiatric: She has a normal mood and affect. Her behavior is normal. Judgment and thought content normal.  Nursing note and vitals reviewed.   BP 128/70 mmHg  Pulse 65  Temp(Src) 98 F (36.7 C) (Oral)  Wt 133 lb 8 oz (60.555 kg)  SpO2 98% Wt Readings from Last 3 Encounters:  04/06/14 133 lb 8 oz (60.555 kg)  02/24/14 130 lb 12.8 oz (59.33 kg)  11/03/13 131 lb (59.421 kg)            Assessment & Plan:

## 2014-04-06 NOTE — Patient Instructions (Addendum)
Good to see you. Have a great time in [place].  We are starting Trazodone 25- 50 mg nightly. Continue Lexapro. We will call you with your appointment to see Dr. Amedeo Plenty.

## 2014-04-25 ENCOUNTER — Telehealth (HOSPITAL_COMMUNITY): Payer: Self-pay | Admitting: Internal Medicine

## 2014-05-02 ENCOUNTER — Other Ambulatory Visit: Payer: Self-pay | Admitting: Family Medicine

## 2014-05-02 NOTE — Telephone Encounter (Signed)
Rx called in to requested pharmacy 

## 2014-05-02 NOTE — Telephone Encounter (Signed)
Last f/u appt 03/2014 

## 2014-05-02 NOTE — Telephone Encounter (Signed)
Spoke to pt and informed her that she may have to pay for [name] out of pocket due to age and fall risk

## 2014-05-22 DIAGNOSIS — G5602 Carpal tunnel syndrome, left upper limb: Secondary | ICD-10-CM | POA: Diagnosis not present

## 2014-05-22 DIAGNOSIS — M79642 Pain in left hand: Secondary | ICD-10-CM | POA: Diagnosis not present

## 2014-05-22 DIAGNOSIS — G5601 Carpal tunnel syndrome, right upper limb: Secondary | ICD-10-CM | POA: Diagnosis not present

## 2014-05-22 DIAGNOSIS — M79641 Pain in right hand: Secondary | ICD-10-CM | POA: Diagnosis not present

## 2014-05-24 ENCOUNTER — Telehealth: Payer: Self-pay | Admitting: Family Medicine

## 2014-05-24 NOTE — Telephone Encounter (Signed)
[name] notified as instructed by telephone.  She states it is her wrist.  She has had an EMG which showed Carpel Tunnel and they recommended surgery but she does not want to do surgery at this time.  I advised Dr. Lorelei Pont will evaluate her next week and if he needs to do an injection he will use Depo Medrol.  I advised we have never had to prior authorize joint injections in the past and if for some reason [employer] does not pay or there is a problem, Dr. Lorelei Pont will take care of her charges.

## 2014-05-24 NOTE — Telephone Encounter (Signed)
I have done many 1,000's of joint injections without prior auths? I am not sure this makes sense at all.  Please tell [name] that I am happy to check out her knees for her. For corticosteroid, there should not be a prior auth. If there is any problem, I can personally wipe away charges.  I have known [name] my whole life - please say hello.  Electronically Signed  By: Owens Loffler, MD On: 05/24/2014 2:03 PM

## 2014-05-24 NOTE — Telephone Encounter (Signed)
Pt is coming in 05/31/14 for cortisone injections.  However her insurance company [employer] states they need a pre-authorization of the medications prior to treatment before they will cover.  Please call pt best number [phone] / lt

## 2014-05-31 ENCOUNTER — Encounter: Payer: Self-pay | Admitting: Family Medicine

## 2014-05-31 ENCOUNTER — Ambulatory Visit (INDEPENDENT_AMBULATORY_CARE_PROVIDER_SITE_OTHER): Payer: Commercial Managed Care - HMO | Admitting: Family Medicine

## 2014-05-31 VITALS — BP 90/60 | HR 64 | Temp 98.4°F | Ht 62.0 in | Wt 130.5 lb

## 2014-05-31 DIAGNOSIS — G5601 Carpal tunnel syndrome, right upper limb: Secondary | ICD-10-CM | POA: Diagnosis not present

## 2014-05-31 DIAGNOSIS — G5602 Carpal tunnel syndrome, left upper limb: Secondary | ICD-10-CM

## 2014-05-31 DIAGNOSIS — G5603 Carpal tunnel syndrome, bilateral upper limbs: Secondary | ICD-10-CM

## 2014-05-31 MED ORDER — METHYLPREDNISOLONE ACETATE 40 MG/ML IJ SUSP
20.0000 mg | Freq: Once | INTRAMUSCULAR | Status: AC
Start: 2014-05-31 — End: 2014-05-31
  Administered 2014-05-31: 20 mg via INTRA_ARTICULAR

## 2014-05-31 MED ORDER — METHYLPREDNISOLONE ACETATE 40 MG/ML IJ SUSP
20.0000 mg | Freq: Once | INTRAMUSCULAR | Status: AC
  Administered 2014-05-31: 20 mg via INTRA_ARTICULAR

## 2014-05-31 MED ORDER — ZOLPIDEM TARTRATE 10 MG PO TABS: 10.0000 mg | ORAL_TABLET | Freq: Every day | ORAL | Status: DC

## 2014-05-31 NOTE — Progress Notes (Signed)
Pre visit review using our clinic review tool, if applicable. No additional management support is needed unless otherwise documented below in the visit note. 

## 2014-05-31 NOTE — Progress Notes (Signed)
Dr. Karleen Hampshire T. Luzelena Heeg, MD, CAQ [facility] [address] [place], [address] Phone: [phone] Fax: [phone]  05/31/2014  Patient: [name], MRN: [id number], DOB: November 02, 1945, 69 y.o.  Primary Physician:  Ruthe Mannan, MD  Chief Complaint: Wrist Pain  Subjective:   [name] is a 69 y.o. very pleasant female patient who presents with the following:  B CTS: CTS: The patent presents a multi-year history of numbness and tingling, greatest in medial aspect of hands, some in ulnar aspect. Some weakness with grip strength. Shome occ. pain going to forearm. Bothers the most at night. Aggravated by lifting arms above head and at night.   Splints? Yes, no improvement. Uncomfortable Prior Injecton: none EMG: below Hand of Dominance: R   Mild-moderate EMG evidence of B CTS. Dr. Amanda Pea recommended surgery a few weeks ago.   Past Medical History, Surgical History, Social History, Family History, Problem List, Medications, and Allergies have been reviewed and updated if relevant.  GEN: No fevers, chills. Nontoxic. Primarily MSK c/o today. MSK: Detailed in the HPI GI: tolerating PO intake without difficulty Neuro: as above Otherwise the pertinent positives of the ROS are noted above.   Objective:   BP 90/60 mmHg  Pulse 64  Temp(Src) 98.4 F (36.9 C) (Oral)  Ht 5\' 2"  (1.575 m)  Wt 130 lb 8 oz (59.194 kg)  BMI 23.86 kg/m2   GEN: WDWN, NAD, Non-toxic, Alert & Oriented x 3 HEENT: Atraumatic, Normocephalic.  Ears and Nose: No external deformity. EXTR: No clubbing/cyanosis/edema NEURO: Normal gait.  PSYCH: Normally interactive. Conversant. Not depressed or anxious appearing.  Calm demeanor.   Hand: B Ecchymosis or edema: neg ROM wrist/hand/digits/elbow: moderate limitation at wrist Carpals, MCP's, digits: NT Distal Ulna and Radius: NT Ecchymosis or edema: neg Cysts/nodules: neg Finkelstein's test: neg Snuffbox  tenderness: neg Scaphoid tubercle: NT Hook of Hamate: NT Resisted supination: NT Full composite fist Grip, all digits: 4+/5 str No tenosynovitis Axial load test: neg Phalen's: pos Tinel's: pos Atrophy: mild  Hand sensation: intact   Radiology: NCV & EMG Findings: 04/11/2013 Extensive evaluation of the left upper extremity and additional studies of the right reveals:  1. Prolonged median sensory responses bilaterally. Ulnar responses are normal.  2. The left median motor response shows prolonged latency with preserved amplitude. 3. The right median motor study recorded from abductor pollicis brevis (APB) muscle obtained by stimulating the nerve at the wrist showed reduced amplitude. Stimulating the nerve at the elbow showed a similar appearance and amplitude, excluding anastomosis in the forearm. Recording from APB, the ulnar nerve was stimulated at the wrist and cubital tunnel which produced a motor response with reduced CMAP amplitude. These findings are most consistent with dual innervation of APB by median and ulnar nerves, suggestive of an Riche-Cannieu anastomosis (RCA), a normal variant. 4. There is no evidence of active or chronic motor axonal loss changes involving the tested muscles.  Impression: 1. Bilateral median neuropathy at or distal to the wrist consistent with the clinical diagnosis of carpal tunnel syndrome. Overall, these changes are mild-moderate in degree electrically and worse on the left side. 2. Incidental finding of right Riche-Cannieu anastomosis, which is a normal variant.  3. There is no evidence of a cervical motor radiculopathy affecting the left side.   ___________________________ Nita Sickle, DO  Assessment and Plan:   Carpal tunnel syndrome, bilateral - Plan: methylPREDNISolone acetate (DEPO-MEDROL) injection 20 mg, methylPREDNISolone acetate (DEPO-MEDROL) injection 20 mg  Patient  known well for almost 4 decades. Recommend trial of CTS injection  B. If no long-term relief, then I would also recommend CTS release when the timing is right for her.   CTS Injection, R Verbal consent was obtained. Risks (including rare risk of infection), benefits, and alternatives were discussed. Prepped with Chloraprep and Ethyl Chloride used for anesthesia. Under sterile conditions,  the patient was injected just ulnar to the palmaris longus tendon at the wrist flexion crease.  The needle was inserted at 45 degree angle aiming distally. Aspiration showed no blood. Medication flowed freely without resistance.  Needle size: 22 gauge 1 1/2 inch Injection: 1/2 cc of Lidocaine 1% and Depo-Medrol 20 mg   CTS Injection, L Verbal consent was obtained. Risks (including rare risk of infection), benefits, and alternatives were discussed. Prepped with Chloraprep and Ethyl Chloride used for anesthesia. Under sterile conditions,  the patient was injected just ulnar to the palmaris longus tendon at the wrist flexion crease.  The needle was inserted at 45 degree angle aiming distally. Aspiration showed no blood. Medication flowed freely without resistance.  Needle size: 22 gauge 1 1/2 inch Injection: 1/2 cc of Lidocaine 1% and Depo-Medrol 20 mg   Signed,  Amna Welker T. Retaj Hilbun, MD   Patient's Medications  New Prescriptions   No medications on file  Previous Medications   AMLODIPINE (NORVASC) 2.5 MG TABLET    Take 1 tablet (2.5 mg total) by mouth daily.   CALCIUM CARBONATE-VITAMIN D (CALCIUM 600 + D PO)    Take 1 tablet by mouth 2 (two) times daily.   CARVEDILOL (COREG) 12.5 MG TABLET    Take 1 tablet (12.5 mg total) by mouth 2 (two) times daily with a meal.   ENALAPRIL (VASOTEC) 10 MG TABLET    Take 1 tablet (10 mg total) by mouth daily.   ESCITALOPRAM (LEXAPRO) 20 MG TABLET    Take 1 tablet (20 mg total) by mouth daily.   MULTIPLE VITAMIN (MULTIVITAMIN) CAPSULE    Take 1 capsule by mouth daily.     PANTOPRAZOLE (PROTONIX) 40 MG TABLET    Take 1 tablet (40 mg total) by  mouth 2 (two) times daily.   PROMETHAZINE (PHENERGAN) 12.5 MG TABLET    Take 1 tablet (12.5 mg total) by mouth every 8 (eight) hours as needed for nausea or vomiting.   SIMVASTATIN (ZOCOR) 20 MG TABLET    TAKE ONE TABLET BY MOUTH AT BEDTIME  Modified Medications   Modified Medication Previous Medication   ZOLPIDEM (AMBIEN) 10 MG TABLET zolpidem (AMBIEN) 10 MG tablet      Take 1 tablet (10 mg total) by mouth at bedtime.    TAKE ONE TABLET AT BEDTIME  Discontinued Medications   TRAZODONE (DESYREL) 50 MG TABLET    Take 0.5-1 tablets (25-50 mg total) by mouth at bedtime as needed for sleep.

## 2014-06-12 ENCOUNTER — Telehealth: Payer: Self-pay | Admitting: Family Medicine

## 2014-06-12 NOTE — Telephone Encounter (Signed)
Noted  

## 2014-06-12 NOTE — Telephone Encounter (Signed)
Patient Name: [name]  DOB: 1945/04/22    Initial Comment caller states she has pink eye   Nurse Assessment  Nurse: Melina Modena, RN, Estill Bamberg Date/Time Eilene Ghazi Time): 06/12/2014 3:04:06 PM  Confirm and document reason for call. If symptomatic, describe symptoms. ---caller states she has pink eye; she visited with grandchildren this weekend and one of them had pink eye; now pt has redness, pain; no discharge or crusting; no fever; eyeball and eye lid are both red  Has the patient traveled out of the country within the last 30 days? ---Not Applicable  Does the patient require triage? ---Yes  Related visit to physician within the last 2 weeks? ---No  Does the PT have any chronic conditions? (i.e. diabetes, asthma, etc.) ---Unknown     Guidelines    Guideline Title Affirmed Question Affirmed Notes  Eye - Red Without Pus Eyelid is red and painful (or tender to touch)    Final Disposition User   See Physician within 24 Hours [place], Therapist, sports, Estill Bamberg    Comments  pt stated she does not want appt with PCP as she believes it is a viral infection in her eye or possibly just pollen related; stated she will wait a [place]ish[place] while to see if it clears up and if not she will call back to get an appt to be seen

## 2014-06-26 DIAGNOSIS — Z85828 Personal history of other malignant neoplasm of skin: Secondary | ICD-10-CM | POA: Diagnosis not present

## 2014-06-26 DIAGNOSIS — D225 Melanocytic nevi of trunk: Secondary | ICD-10-CM | POA: Diagnosis not present

## 2014-06-26 DIAGNOSIS — L821 Other seborrheic keratosis: Secondary | ICD-10-CM | POA: Diagnosis not present

## 2014-06-26 DIAGNOSIS — C44712 Basal cell carcinoma of skin of right lower limb, including hip: Secondary | ICD-10-CM | POA: Diagnosis not present

## 2014-06-26 DIAGNOSIS — D485 Neoplasm of uncertain behavior of skin: Secondary | ICD-10-CM | POA: Diagnosis not present

## 2014-06-26 DIAGNOSIS — L570 Actinic keratosis: Secondary | ICD-10-CM | POA: Diagnosis not present

## 2014-06-28 ENCOUNTER — Other Ambulatory Visit: Payer: Self-pay | Admitting: Family Medicine

## 2014-06-28 NOTE — Telephone Encounter (Signed)
Last OV with Dr. Lorelei Pont on 05/31/14.  Rx was filled at that visit #30 with no refills.  Okay to refill?

## 2014-07-10 DIAGNOSIS — C44712 Basal cell carcinoma of skin of right lower limb, including hip: Secondary | ICD-10-CM | POA: Diagnosis not present

## 2014-07-20 DIAGNOSIS — H02053 Trichiasis without entropian right eye, unspecified eyelid: Secondary | ICD-10-CM | POA: Diagnosis not present

## 2014-07-24 ENCOUNTER — Encounter: Payer: Self-pay | Admitting: Primary Care

## 2014-07-24 ENCOUNTER — Ambulatory Visit (INDEPENDENT_AMBULATORY_CARE_PROVIDER_SITE_OTHER): Payer: Commercial Managed Care - HMO | Admitting: Primary Care

## 2014-07-24 VITALS — BP 120/70 | HR 66 | Temp 98.1°F | Ht 62.0 in | Wt 131.8 lb

## 2014-07-24 DIAGNOSIS — R319 Hematuria, unspecified: Secondary | ICD-10-CM

## 2014-07-24 DIAGNOSIS — R350 Frequency of micturition: Secondary | ICD-10-CM

## 2014-07-24 DIAGNOSIS — E7800 Pure hypercholesterolemia, unspecified: Secondary | ICD-10-CM

## 2014-07-24 DIAGNOSIS — N390 Urinary tract infection, site not specified: Secondary | ICD-10-CM | POA: Diagnosis not present

## 2014-07-24 LAB — POCT URINALYSIS DIPSTICK
Bilirubin, UA: NEGATIVE
Glucose, UA: NEGATIVE
Ketones, UA: NEGATIVE
Nitrite, UA: POSITIVE
PH UA: 7.5
PROTEIN UA: NEGATIVE
SPEC GRAV UA: 1.015
UROBILINOGEN UA: 4

## 2014-07-24 MED ORDER — PHENAZOPYRIDINE HCL 200 MG PO TABS: 200.0000 mg | ORAL_TABLET | Freq: Three times a day (TID) | ORAL | Status: DC | PRN

## 2014-07-24 MED ORDER — CIPROFLOXACIN HCL 250 MG PO TABS: 250.0000 mg | ORAL_TABLET | Freq: Two times a day (BID) | ORAL | Status: DC

## 2014-07-24 MED ORDER — PROMETHAZINE HCL 12.5 MG PO TABS: 12.5000 mg | ORAL_TABLET | Freq: Three times a day (TID) | ORAL | Status: DC | PRN

## 2014-07-24 NOTE — Assessment & Plan Note (Signed)
Refill of Phenergan provided that she takes when migraines occur.

## 2014-07-24 NOTE — Progress Notes (Signed)
Subjective:    Patient ID: [name], female    DOB: 02-18-1946, 69 y.o.   MRN: [id number]  HPI  [name] is a 69 year old female who presents today with a chief commplaint of foul odor, urinary frequency, and flank pain that has been present since Friday evening last week. She noticed a worsening smell in her urine over the weekend. She's had a history of a UTI in the past and these symptoms feel similar. She's not taken anything over the counter for her symptoms. Denies fevers and hematuria.   She is also requesting a refill of her phenergan for which she takes for migraines  She is about to take a long trip and is nearly out. She will get migraines when traveling to [place], including [place] which is where she'll be traveling on Wednesday this week.  Review of Systems  Constitutional: Negative for fever and chills.  Gastrointestinal: Negative for nausea and vomiting.  Genitourinary: Positive for urgency, frequency, flank pain and difficulty urinating. Negative for dysuria, hematuria, vaginal discharge and vaginal pain.  Musculoskeletal: Negative for myalgias.       Past Medical History  Diagnosis Date  . Migraine   . Cardiomyopathy     nonischemic  . GERD (gastroesophageal reflux disease)   . Erosive esophagitis     LA Classification Grade B  . Hyperlipidemia   . Osteopenia   . Hiatal hernia   . Melanoma     basil cell  . Diabetes mellitus     pt states she is not diabetic  . Sessile colonic polyp 10/2011    adenoma    History   Social History  . Marital Status: Married    Spouse Name: N/A  . Number of Children: 3  . Years of Education: N/A   Occupational History  . Housewife    Social History Main Topics  . Smoking status: Former Smoker    Quit date: 06/03/1981  . Smokeless tobacco: Never Used  . Alcohol Use: 1.8 oz/week    3 Glasses of wine per week     Comment: Wine on occasional  . Drug Use: No  . Sexual Activity: Not on file    Other Topics Concern  . Not on file   Social History Narrative   Lives alone in two-story home.     Her husband recently passed away unexpectedly.  They have three children.    Past Surgical History  Procedure Laterality Date  . Tubal ligation    . Vaginal hysterectomy    . Appendectomy      Family History  Problem Relation Age of Onset  . Heart disease Father   . Alzheimer's disease Mother   . Colon cancer Neg Hx   . Stomach cancer Neg Hx   . Rectal cancer Neg Hx   . Esophageal cancer Neg Hx   . Arthritis/Rheumatoid Sister     Allergies  Allergen Reactions  . Penicillins Hives  . Lansoprazole     REACTION: headaches  . Sulfa Antibiotics Hives  . Ceclor [Cefaclor] Itching and Rash  . Ceftin [Cefuroxime Axetil] Rash  . Clindamycin/Lincomycin Itching and Rash  . Latex Itching and Rash  . Septra [Sulfamethoxazole-Trimethoprim] Rash    Current Outpatient Prescriptions on File Prior to Visit  Medication Sig Dispense Refill  . amLODipine (NORVASC) 2.5 MG tablet Take 1 tablet (2.5 mg total) by mouth daily. 90 tablet 3  . Calcium Carbonate-Vitamin D (CALCIUM 600 + D PO) Take  1 tablet by mouth 2 (two) times daily.    . carvedilol (COREG) 12.5 MG tablet Take 1 tablet (12.5 mg total) by mouth 2 (two) times daily with a meal. 180 tablet 3  . enalapril (VASOTEC) 10 MG tablet Take 1 tablet (10 mg total) by mouth daily. 90 tablet 3  . escitalopram (LEXAPRO) 20 MG tablet Take 1 tablet (20 mg total) by mouth daily. 30 tablet 11  . Multiple Vitamin (MULTIVITAMIN) capsule Take 1 capsule by mouth daily.      . pantoprazole (PROTONIX) 40 MG tablet Take 1 tablet (40 mg total) by mouth 2 (two) times daily. 180 tablet 3  . simvastatin (ZOCOR) 20 MG tablet TAKE ONE TABLET BY MOUTH AT BEDTIME 90 tablet 2  . zolpidem (AMBIEN) 10 MG tablet TAKE ONE TABLET AT BEDTIME 30 tablet 0   No current facility-administered medications on file prior to visit.    BP 120/70 mmHg  Pulse 66   Temp(Src) 98.1 F (36.7 C) (Oral)  Ht 5\' 2"  (1.575 m)  Wt 131 lb 12.8 oz (59.784 kg)  BMI 24.10 kg/m2  SpO2 99%    Objective:   Physical Exam  Constitutional: She is oriented to person, place, and time. She appears well-nourished. She does not appear ill.  Cardiovascular: Normal rate and regular rhythm.   Pulmonary/Chest: Effort normal and breath sounds normal.  Abdominal: There is CVA tenderness.  Left flank  Neurological: She is alert and oriented to person, place, and time.  Skin: Skin is warm and dry.          Assessment & Plan:  Urinary tract infection:  UA positive for leuks, blood, nitrites. RX for Ciprofloxacin 250 BID for 7 days; Pyridium 200 mg TID PRN. Culture sent. Follow up if no improvement in 3-4 days.

## 2014-07-24 NOTE — Patient Instructions (Signed)
Your urine shows an infection. Start Ciprofloxacin antibiotics. Take 1 tablet by mouth twice daily for 7 days. You may take the Pyridium three times daily as needed for symptoms. A refill has been provided for your phenergan.  Have a safe trip!

## 2014-07-24 NOTE — Progress Notes (Signed)
Pre visit review using our clinic review tool, if applicable. No additional management support is needed unless otherwise documented below in the visit note. 

## 2014-07-27 ENCOUNTER — Telehealth: Payer: Self-pay

## 2014-07-27 LAB — URINE CULTURE: Colony Count: >=100000

## 2014-07-27 NOTE — Telephone Encounter (Signed)
Patient called back. Notified her of Kate's comments. Patient verbalized understanding.

## 2014-07-27 NOTE — Telephone Encounter (Signed)
Pt left v/m; pt seen 07/24/14 and pt request cb with urine culture results when available; [facility].Please advise.

## 2014-07-27 NOTE — Telephone Encounter (Signed)
Please notify Ms. Feldmeier that I received her urine culture this morning and the Cipro should provide good coverage for the bacteria. Is she feeling any better?

## 2014-07-27 NOTE — Telephone Encounter (Signed)
Left voicemail for patient to call back. 

## 2014-08-02 ENCOUNTER — Other Ambulatory Visit: Payer: Self-pay | Admitting: Family Medicine

## 2014-08-02 DIAGNOSIS — H40003 Preglaucoma, unspecified, bilateral: Secondary | ICD-10-CM | POA: Diagnosis not present

## 2014-08-02 NOTE — Telephone Encounter (Signed)
Last f/u appt 03/2014 

## 2014-08-02 NOTE — Telephone Encounter (Signed)
Rx called in to requested pharmacy 

## 2014-08-08 ENCOUNTER — Encounter: Payer: Self-pay | Admitting: Family Medicine

## 2014-08-08 DIAGNOSIS — Z1231 Encounter for screening mammogram for malignant neoplasm of breast: Secondary | ICD-10-CM | POA: Diagnosis not present

## 2014-08-10 ENCOUNTER — Ambulatory Visit (INDEPENDENT_AMBULATORY_CARE_PROVIDER_SITE_OTHER): Payer: Commercial Managed Care - HMO | Admitting: Primary Care

## 2014-08-10 ENCOUNTER — Encounter: Payer: Self-pay | Admitting: Primary Care

## 2014-08-10 DIAGNOSIS — R350 Frequency of micturition: Secondary | ICD-10-CM

## 2014-08-10 LAB — POCT URINALYSIS DIPSTICK
BILIRUBIN UA: NEGATIVE
Blood, UA: NEGATIVE
GLUCOSE UA: NEGATIVE
Ketones, UA: NEGATIVE
Nitrite, UA: NEGATIVE
PH UA: 6.5
Protein, UA: NEGATIVE
SPEC GRAV UA: 1.015
Urobilinogen, UA: 0.2

## 2014-08-10 MED ORDER — CIPROFLOXACIN HCL 500 MG PO TABS: 500.0000 mg | ORAL_TABLET | Freq: Two times a day (BID) | ORAL | Status: DC

## 2014-08-10 NOTE — Addendum Note (Signed)
Addended by: Ellamae Sia on: 08/10/2014 05:28 PM   Modules accepted: Orders

## 2014-08-10 NOTE — Progress Notes (Signed)
Pre visit review using our clinic review tool, if applicable. No additional management support is needed unless otherwise documented below in the visit note. 

## 2014-08-10 NOTE — Patient Instructions (Signed)
Start Ciprofloxacin 500 mg tablets. Take 1 tablet by mouth twice daily for 10 days. We will send your urine off for culture and I will notify you if we need to switch medications. You must increase your water consumption. You may use the pyridium as needed for discomfort. You may use tylenol for pain.  Call me if no improvement once antibiotics are complete. It was nice seeing you!

## 2014-08-10 NOTE — Progress Notes (Signed)
Subjective:    Patient ID: [name], female    DOB: 24-Nov-1945, 69 y.o.   MRN: [id number]  HPI  [name] is a 69 year old female who presents today with a chief complaint of urinary frequency and flank pain.  She was evaluated on 5/16 for symptoms of urinary frequency, foul odor, and flank pain that had been present for several days. She was treated with a course of Cipro 250 mg BID for 7 days which was the appropriate treatment according to the culture results. She completed the course of Ciprofloxacin as directed.  She continues to report the urinary frequency and flank pain. She's been traveling over the past several weeks and is unsure if the flank pain is related to recent travel on planes or from the UTI. Denies fevers, chills, nausea.  Review of Systems  Constitutional: Negative for fever and chills.  Respiratory: Negative for shortness of breath.   Cardiovascular: Negative for chest pain.  Gastrointestinal: Negative for abdominal pain.  Genitourinary: Positive for frequency and flank pain. Negative for dysuria, vaginal discharge and vaginal pain.       Past Medical History  Diagnosis Date  . Migraine   . Cardiomyopathy     nonischemic  . GERD (gastroesophageal reflux disease)   . Erosive esophagitis     LA Classification Grade B  . Hyperlipidemia   . Osteopenia   . Hiatal hernia   . Melanoma     basil cell  . Diabetes mellitus     pt states she is not diabetic  . Sessile colonic polyp 10/2011    adenoma    History   Social History  . Marital Status: Married    Spouse Name: N/A  . Number of Children: 3  . Years of Education: N/A   Occupational History  . Housewife    Social History Main Topics  . Smoking status: Former Smoker    Quit date: 06/03/1981  . Smokeless tobacco: Never Used  . Alcohol Use: 1.8 oz/week    3 Glasses of wine per week     Comment: Wine on occasional  . Drug Use: No  . Sexual Activity: Not on file   Other Topics Concern   . Not on file   Social History Narrative   Lives alone in two-story home.     Her husband recently passed away unexpectedly.  They have three children.    Past Surgical History  Procedure Laterality Date  . Tubal ligation    . Vaginal hysterectomy    . Appendectomy      Family History  Problem Relation Age of Onset  . Heart disease Father   . Alzheimer's disease Mother   . Colon cancer Neg Hx   . Stomach cancer Neg Hx   . Rectal cancer Neg Hx   . Esophageal cancer Neg Hx   . Arthritis/Rheumatoid Sister     Allergies  Allergen Reactions  . Penicillins Hives  . Lansoprazole     REACTION: headaches  . Sulfa Antibiotics Hives  . Ceclor [Cefaclor] Itching and Rash  . Ceftin [Cefuroxime Axetil] Rash  . Clindamycin/Lincomycin Itching and Rash  . Latex Itching and Rash  . Septra [Sulfamethoxazole-Trimethoprim] Rash    Current Outpatient Prescriptions on File Prior to Visit  Medication Sig Dispense Refill  . amLODipine (NORVASC) 2.5 MG tablet Take 1 tablet (2.5 mg total) by mouth daily. 90 tablet 3  . Calcium Carbonate-Vitamin D (CALCIUM 600 + D PO) Take 1 tablet  by mouth 2 (two) times daily.    . carvedilol (COREG) 12.5 MG tablet Take 1 tablet (12.5 mg total) by mouth 2 (two) times daily with a meal. 180 tablet 3  . enalapril (VASOTEC) 10 MG tablet Take 1 tablet (10 mg total) by mouth daily. 90 tablet 3  . escitalopram (LEXAPRO) 20 MG tablet Take 1 tablet (20 mg total) by mouth daily. 30 tablet 11  . Multiple Vitamin (MULTIVITAMIN) capsule Take 1 capsule by mouth daily.      . pantoprazole (PROTONIX) 40 MG tablet Take 1 tablet (40 mg total) by mouth 2 (two) times daily. 180 tablet 3  . phenazopyridine (PYRIDIUM) 200 MG tablet Take 1 tablet (200 mg total) by mouth 3 (three) times daily as needed for pain. 10 tablet 0  . promethazine (PHENERGAN) 12.5 MG tablet Take 1 tablet (12.5 mg total) by mouth every 8 (eight) hours as needed for nausea or vomiting. 20 tablet 0  .  simvastatin (ZOCOR) 20 MG tablet TAKE ONE TABLET BY MOUTH AT BEDTIME 90 tablet 2  . zolpidem (AMBIEN) 10 MG tablet TAKE ONE TABLET BY MOUTH AT BEDTIME 30 tablet 0   No current facility-administered medications on file prior to visit.    BP 120/70 mmHg  Pulse 65  Temp(Src) 98 F (36.7 C) (Oral)  Ht 5\' 2"  (1.575 m)  Wt 133 lb 6.4 oz (60.51 kg)  BMI 24.39 kg/m2  SpO2 98%     Objective:   Physical Exam  Constitutional: She does not appear ill.  Cardiovascular: Normal rate and regular rhythm.   Pulmonary/Chest: Effort normal and breath sounds normal.  Abdominal: There is CVA tenderness.  Skin: Skin is warm and dry.          Assessment & Plan:  Urinary tract infection:  UA: 1+ Leuks. No nitrites, blood, glucose. Improved from last visit. Right sided CVA tenderness.  Will treat with Cipro 500 mg BID for 10 days. Culture sent.  She has some pyridium at home for symptoms. Follow up if no improvement in 3-4 days.

## 2014-08-11 LAB — URINE CULTURE
Colony Count: NO GROWTH
Organism ID, Bacteria: NO GROWTH

## 2014-08-14 ENCOUNTER — Telehealth: Payer: Self-pay

## 2014-08-14 NOTE — Telephone Encounter (Signed)
Spoke with patient regarding symptoms. Her urine culture showed no growth so she is to stop taking the Cipro and to call if her urinary symptome return. She verbalized understanding.

## 2014-08-14 NOTE — Telephone Encounter (Signed)
Pt left v/m; pt was seen 08/10/14 and has been taking Cipro 500 mg bid; pt is experiencing nausea and reflux is worse; pt wants to know if can decrease to Cipro 500 mg once daily.pt also request urine culture results  Pt request cb.

## 2014-08-29 ENCOUNTER — Other Ambulatory Visit: Payer: Self-pay | Admitting: Family Medicine

## 2014-08-29 NOTE — Telephone Encounter (Signed)
Last refill 08/02/14 #30 Last office visit 08/10/14/acute Is it okay to refill?

## 2014-08-30 ENCOUNTER — Other Ambulatory Visit: Payer: Self-pay | Admitting: Family Medicine

## 2014-08-30 NOTE — Telephone Encounter (Signed)
Pt left v/m; pt request status of ambien; pt is going to [facility] due to death and will return on 09/08/14. Pt request cb.

## 2014-08-30 NOTE — Telephone Encounter (Signed)
Rx called in to requested pharmacy 

## 2014-09-25 ENCOUNTER — Other Ambulatory Visit: Payer: Self-pay | Admitting: Gastroenterology

## 2014-09-28 ENCOUNTER — Other Ambulatory Visit: Payer: Self-pay | Admitting: Family Medicine

## 2014-09-28 NOTE — Telephone Encounter (Signed)
Last f/u appt 03/2014 

## 2014-09-28 NOTE — Telephone Encounter (Signed)
Rx called in to requested pharmacy 

## 2014-10-07 ENCOUNTER — Other Ambulatory Visit: Payer: Self-pay | Admitting: Gastroenterology

## 2014-10-09 DIAGNOSIS — L918 Other hypertrophic disorders of the skin: Secondary | ICD-10-CM | POA: Diagnosis not present

## 2014-10-09 DIAGNOSIS — Z85828 Personal history of other malignant neoplasm of skin: Secondary | ICD-10-CM | POA: Diagnosis not present

## 2014-10-09 DIAGNOSIS — L570 Actinic keratosis: Secondary | ICD-10-CM | POA: Diagnosis not present

## 2014-10-17 ENCOUNTER — Ambulatory Visit (INDEPENDENT_AMBULATORY_CARE_PROVIDER_SITE_OTHER): Payer: Commercial Managed Care - HMO | Admitting: Internal Medicine

## 2014-10-17 ENCOUNTER — Encounter: Payer: Self-pay | Admitting: Internal Medicine

## 2014-10-17 VITALS — BP 118/64 | HR 68 | Temp 98.1°F | Wt 131.0 lb

## 2014-10-17 DIAGNOSIS — R05 Cough: Secondary | ICD-10-CM

## 2014-10-17 DIAGNOSIS — R059 Cough, unspecified: Secondary | ICD-10-CM

## 2014-10-17 NOTE — Progress Notes (Signed)
Pre visit review using our clinic review tool, if applicable. No additional management support is needed unless otherwise documented below in the visit note. 

## 2014-10-17 NOTE — Progress Notes (Signed)
HPI  Pt presents to the clinic today with c/o cough. This started 8 days ago. The cough is dry and nonproductive. She has had some associated fatigue and ear fullness. She has not tried anything OTC. She has not had sick contacts. She has no history of allergies or breathing problems. She does have a history of GERD but reports this fills different. She does take Protonix as prescribed.  Review of Systems      Past Medical History  Diagnosis Date  . Migraine   . Cardiomyopathy     nonischemic  . GERD (gastroesophageal reflux disease)   . Erosive esophagitis     LA Classification Grade B  . Hyperlipidemia   . Osteopenia   . Hiatal hernia   . Melanoma     basil cell  . Diabetes mellitus     pt states she is not diabetic  . Sessile colonic polyp 10/2011    adenoma    Family History  Problem Relation Age of Onset  . Heart disease Father   . Alzheimer's disease Mother   . Colon cancer Neg Hx   . Stomach cancer Neg Hx   . Rectal cancer Neg Hx   . Esophageal cancer Neg Hx   . Arthritis/Rheumatoid Sister     History   Social History  . Marital Status: Married    Spouse Name: N/A  . Number of Children: 3  . Years of Education: N/A   Occupational History  . Housewife    Social History Main Topics  . Smoking status: Former Smoker    Quit date: 06/03/1981  . Smokeless tobacco: Never Used  . Alcohol Use: 1.8 oz/week    3 Glasses of wine per week     Comment: Wine on occasional  . Drug Use: No  . Sexual Activity: Not on file   Other Topics Concern  . Not on file   Social History Narrative   Lives alone in two-story home.     Her husband recently passed away unexpectedly.  They have three children.    Allergies  Allergen Reactions  . Penicillins Hives  . Lansoprazole     REACTION: headaches  . Sulfa Antibiotics Hives  . Ceclor [Cefaclor] Itching and Rash  . Ceftin [Cefuroxime Axetil] Rash  . Clindamycin/Lincomycin Itching and Rash  . Latex Itching and Rash   . Septra [Sulfamethoxazole-Trimethoprim] Rash     Constitutional: Positive fatigue. Denies headache, fever or abrupt weight changes.  HEENT:  Positive ear fullness. Denies eye redness, eye pain, pressure behind the eyes, facial pain, nasal congestion, ear pain, ringing in the ears, wax buildup, runny nose or nasal congestion. Respiratory: Positive cough. Denies difficulty breathing or shortness of breath.  Cardiovascular: Denies chest pain, chest tightness, palpitations or swelling in the hands or feet.   No other specific complaints in a complete review of systems (except as listed in HPI above).  Objective:   BP 118/64 mmHg  Pulse 68  Temp(Src) 98.1 F (36.7 C) (Oral)  Wt 131 lb (59.421 kg)  SpO2 98% Wt Readings from Last 3 Encounters:  10/17/14 131 lb (59.421 kg)  08/10/14 133 lb 6.4 oz (60.51 kg)  07/24/14 131 lb 12.8 oz (59.784 kg)     General: Appears her stated age, well developed, well nourished in NAD. HEENT: Head: normal shape and size; Eyes: sclera white, no icterus, conjunctiva pink, PERRLA and EOMs intact; Ears: Tm's gray and intact, normal light reflex; Nose: mucosa pink and moist, septum midline; Throat/Mouth:  Teeth present, mucosa pink and moist, no exudate noted, no lesions or ulcerations noted.  Neck: No cervical lymphadenopathy.  Cardiovascular: Normal rate and rhythm. S1,S2 noted.  No murmur, rubs or gallops noted.  Pulmonary/Chest: Normal effort and positive vesicular breath sounds. No respiratory distress. No wheezes, rales or ronchi noted.      Assessment & Plan:   Cough:  Likely viral Get some rest and drink plenty of water Start Mucinex BID for the next 3-4 days If no improvement, call me Friday, will call in Tira  RTC as needed or if symptoms persist.

## 2014-10-17 NOTE — Patient Instructions (Signed)
Cough, Adult  A cough is a reflex that helps clear your throat and airways. It can help heal the body or may be a reaction to an irritated airway. A cough may only last 2 or 3 weeks (acute) or may last more than 8 weeks (chronic).  CAUSES Acute cough:  Viral or bacterial infections. Chronic cough:  Infections.  Allergies.  Asthma.  Post-nasal drip.  Smoking.  Heartburn or acid reflux.  Some medicines.  Chronic lung problems (COPD).  Cancer. SYMPTOMS   Cough.  Fever.  Chest pain.  Increased breathing rate.  High-pitched whistling sound when breathing (wheezing).  Colored mucus that you cough up (sputum). TREATMENT   A bacterial cough may be treated with antibiotic medicine.  A viral cough must run its course and will not respond to antibiotics.  Your caregiver may recommend other treatments if you have a chronic cough. HOME CARE INSTRUCTIONS   Only take over-the-counter or prescription medicines for pain, discomfort, or fever as directed by your caregiver. Use cough suppressants only as directed by your caregiver.  Use a cold steam vaporizer or humidifier in your bedroom or home to help loosen secretions.  Sleep in a semi-upright position if your cough is worse at night.  Rest as needed.  Stop smoking if you smoke. SEEK IMMEDIATE MEDICAL CARE IF:   You have pus in your sputum.  Your cough starts to worsen.  You cannot control your cough with suppressants and are losing sleep.  You begin coughing up blood.  You have difficulty breathing.  You develop pain which is getting worse or is uncontrolled with medicine.  You have a fever. MAKE SURE YOU:   Understand these instructions.  Will watch your condition.  Will get help right away if you are not doing well or get worse. Document Released: 08/23/2010 Document Revised: 05/19/2011 Document Reviewed: 08/23/2010 Exit[employer] Patient Information 2015 [employer]. This information is not intended  to replace advice given to you by your health care provider. Make sure you discuss any questions you have with your health care provider.  

## 2014-10-20 ENCOUNTER — Telehealth: Payer: Self-pay

## 2014-10-20 ENCOUNTER — Other Ambulatory Visit: Payer: Self-pay | Admitting: Internal Medicine

## 2014-10-20 MED ORDER — AZITHROMYCIN 250 MG PO TABS
ORAL_TABLET | ORAL | Status: DC
Start: 2014-10-20 — End: 2014-12-04

## 2014-10-20 NOTE — Telephone Encounter (Signed)
[facility] sent [facility]

## 2014-10-20 NOTE — Telephone Encounter (Signed)
Pt left v/m; pt was seen 10/17/14; pt's cough is no better; pt has been coughing for 11 days. Pt request abx to [facility].Please advise. Pt request cb.

## 2014-10-24 NOTE — Telephone Encounter (Signed)
Pt left v/m to see if abx was sent to [facility]. Spoke with pt and apologized that she did not get cb on 10/20/14; pt spoke with pharmacy and has already picked up zpack and will cb if needed.

## 2014-10-27 ENCOUNTER — Telehealth: Payer: Self-pay | Admitting: Family Medicine

## 2014-10-27 ENCOUNTER — Encounter: Payer: Self-pay | Admitting: Family Medicine

## 2014-10-27 ENCOUNTER — Ambulatory Visit (INDEPENDENT_AMBULATORY_CARE_PROVIDER_SITE_OTHER): Payer: Commercial Managed Care - HMO | Admitting: Family Medicine

## 2014-10-27 VITALS — BP 118/65 | HR 67 | Temp 98.5°F | Ht 62.0 in | Wt 130.5 lb

## 2014-10-27 DIAGNOSIS — R059 Cough, unspecified: Secondary | ICD-10-CM

## 2014-10-27 DIAGNOSIS — R05 Cough: Secondary | ICD-10-CM

## 2014-10-27 DIAGNOSIS — H6983 Other specified disorders of Eustachian tube, bilateral: Secondary | ICD-10-CM | POA: Diagnosis not present

## 2014-10-27 DIAGNOSIS — H6980 Other specified disorders of Eustachian tube, unspecified ear: Secondary | ICD-10-CM | POA: Insufficient documentation

## 2014-10-27 NOTE — Telephone Encounter (Signed)
Pt has appt scheduled for 10/27/14 at 3 pm with Dr Diona Browner.

## 2014-10-27 NOTE — Progress Notes (Signed)
Pre visit review using our clinic review tool, if applicable. No additional management support is needed unless otherwise documented below in the visit note. 

## 2014-10-27 NOTE — Telephone Encounter (Signed)
Patient Name: [name] DOB: December 01, 1945 Initial Comment Caller states has a cough x 18 days, no energy, not feeling well Nurse Assessment Nurse: Vallery Sa, RN, Tye [place] Date/Time (Eastern Time): 10/27/2014 9:13:27 AM Confirm and document reason for call. If symptomatic, describe symptoms. ---Caller states she developed a productive cough about 18 days ago. No fever. No severe breathing difficulty. She developed shortness of breath about 3-4 days. No wheezing. She started a Zpak 3 days ago. Has the patient traveled out of the country within the last 30 days? ---No Does the patient require triage? ---Yes Related visit to physician within the last 2 weeks? ---Yes Does the PT have any chronic conditions? (i.e. diabetes, asthma, etc.) ---Yes List chronic conditions. ---Cardiomyopathy, Reflux Guidelines Guideline Title Affirmed Question Affirmed Notes Cough - Acute Productive SEVERE coughing spells (e.g., whooping sound after coughing, vomiting after coughing) Final Disposition User See Physician within [place], RN, Tye [place] Comments Scheduled for 3pm appointment today with Dr. Diona Browner. Referrals REFERRED TO PCP OFFICE Disagree/Comply: Comply

## 2014-10-27 NOTE — Assessment & Plan Note (Signed)
COmpelte antibitoics. Supportive care. If not improving could consider changing to doxy, but not indicated at this time.

## 2014-10-27 NOTE — Patient Instructions (Signed)
Conitnue z-pack.  Try flonase for Eustachian tube dysfunction.  Can use mucinex as needed for cough and congestion.  Call if not turning the corner next week for consideration of different antibiotics.

## 2014-10-27 NOTE — Assessment & Plan Note (Signed)
Treat with nasal steroid and saline.

## 2014-10-27 NOTE — Progress Notes (Signed)
   Subjective:    Patient ID: [name], female    DOB: [birth year], 69 y.o.   MRN: [id number]  HPI  69 year old female patient of Dr. Hulen Shouts presents with continued cough.  She was seen on 10/17/2014: by Webb Silversmith.  Felt viral infection.  Pt called back with continued symptoms on 10/20/2014... Z-pack started 4 days ago.  She feels like head still hurts, decrease energy, ears full. No ear pain. Body aches  Sore throat.  Cough, occ productive.  No SOB, no wheezing. No fever.  No appetite.  Sleeping at night.    Using ibuprofen and halls for symptoms, helped a Doughman.   KD:XIPJAS smoker No asthma, no COPD.      Review of Systems  Constitutional: Negative for fever and fatigue.  HENT: Negative for ear pain.   Eyes: Negative for pain.  Respiratory: Negative for chest tightness and shortness of breath.   Cardiovascular: Negative for chest pain, palpitations and leg swelling.  Gastrointestinal: Negative for abdominal pain.  Genitourinary: Negative for dysuria.       Objective:   Physical Exam  Constitutional: Vital signs are normal. She appears well-developed and well-nourished. She is cooperative.  Non-toxic appearance. She does not appear ill. No distress.  HENT:  Head: Normocephalic.  Right Ear: Hearing, external ear and ear canal normal. Tympanic membrane is not erythematous, not retracted and not bulging. A middle ear effusion is present.  Left Ear: Hearing, external ear and ear canal normal. Tympanic membrane is not erythematous, not retracted and not bulging. A middle ear effusion is present.  Nose: Mucosal edema and rhinorrhea present. Right sinus exhibits no maxillary sinus tenderness and no frontal sinus tenderness. Left sinus exhibits no maxillary sinus tenderness and no frontal sinus tenderness.  Mouth/Throat: Uvula is midline, oropharynx is clear and moist and mucous membranes are normal.  Eyes: Conjunctivae, EOM and lids are normal. Pupils are equal, round,  and reactive to light. Lids are everted and swept, no foreign bodies found.  Neck: Trachea normal and normal range of motion. Neck supple. Carotid bruit is not present. No thyroid mass and no thyromegaly present.  Cardiovascular: Normal rate, regular rhythm, S1 normal, S2 normal, normal heart sounds, intact distal pulses and normal pulses.  Exam reveals no gallop and no friction rub.   No murmur heard. Pulmonary/Chest: Effort normal and breath sounds normal. No tachypnea. No respiratory distress. She has no decreased breath sounds. She has no wheezes. She has no rhonchi. She has no rales.  Neurological: She is alert.  Skin: Skin is warm, dry and intact. No rash noted.  Psychiatric: Her speech is normal and behavior is normal. Judgment normal. Her mood appears not anxious. Cognition and memory are normal. She does not exhibit a depressed mood.          Assessment & Plan:

## 2014-10-28 ENCOUNTER — Other Ambulatory Visit: Payer: Self-pay | Admitting: Family Medicine

## 2014-10-30 NOTE — Telephone Encounter (Signed)
Last f/u appt 03/2014 

## 2014-10-30 NOTE — Telephone Encounter (Signed)
Rx called in to requested pharmacy 

## 2014-11-28 ENCOUNTER — Telehealth: Payer: Self-pay | Admitting: Family Medicine

## 2014-11-28 ENCOUNTER — Other Ambulatory Visit: Payer: Self-pay | Admitting: Family Medicine

## 2014-11-28 MED ORDER — FLUOCINONIDE-E 0.05 % EX CREA: 1.0000 "application " | TOPICAL_CREAM | Freq: Two times a day (BID) | CUTANEOUS | Status: DC

## 2014-11-28 NOTE — Telephone Encounter (Signed)
Pt called wanting to get something called in for poison ivey. She has tried everything over the counter Offered appointment today with kate.  Pt refused she stated she was covered in ca limine lotion   [facility]

## 2014-11-28 NOTE — Telephone Encounter (Signed)
Last f/u appt 03/2014 

## 2014-11-28 NOTE — Telephone Encounter (Signed)
Spoke to pt and advised. Pt verbally expressed understanding. Advised Rx has been sent to pharmacy

## 2014-11-28 NOTE — Telephone Encounter (Signed)
Rx called in to requested pharmacy 

## 2014-11-28 NOTE — Telephone Encounter (Signed)
We can call in prescription strength topical rx but if symptoms do not improve, she needs to be seen.

## 2014-12-04 ENCOUNTER — Ambulatory Visit (INDEPENDENT_AMBULATORY_CARE_PROVIDER_SITE_OTHER): Payer: Commercial Managed Care - HMO | Admitting: Internal Medicine

## 2014-12-04 ENCOUNTER — Encounter: Payer: Self-pay | Admitting: Internal Medicine

## 2014-12-04 VITALS — BP 148/82 | HR 76 | Temp 98.2°F | Wt 128.2 lb

## 2014-12-04 DIAGNOSIS — L255 Unspecified contact dermatitis due to plants, except food: Secondary | ICD-10-CM

## 2014-12-04 MED ORDER — HYDROXYZINE HCL 10 MG PO TABS: 10.0000 mg | ORAL_TABLET | Freq: Three times a day (TID) | ORAL | Status: DC | PRN

## 2014-12-04 MED ORDER — PREDNISONE 20 MG PO TABS
ORAL_TABLET | ORAL | Status: DC
Start: 2014-12-04 — End: 2015-03-22

## 2014-12-04 MED ORDER — METHYLPREDNISOLONE ACETATE 40 MG/ML IJ SUSP
80.0000 mg | Freq: Once | INTRAMUSCULAR | Status: AC
  Administered 2014-12-04: 80 mg via INTRAMUSCULAR

## 2014-12-04 NOTE — Patient Instructions (Signed)

## 2014-12-04 NOTE — Progress Notes (Signed)
Subjective:    Patient ID: [name], female    DOB: 1945/10/31, 69 y.o.   MRN: [id number]  HPI  Pt presents to the clinic today with c/o a rash. This started 9 days ago. The rash started out on her arms, but spread to her legs and abdomen. The rash is very itchy. It has been draining clear fluid. She thinks it is poison ivy. She has not changed soaps, lotions or detergents. She has tried Benadryl, Calamine and Hydrocortisone cream without any relief. Dr. Deborra Medina call her in some cream- but she did not get it because it was too expensive. Her daughter in law called her in Methylprednisolone, which she took for 6 days. She took her last pill this morning. The rash has still not dried up. The itching is driving her crazy.  Review of Systems      Past Medical History  Diagnosis Date  . Migraine   . Cardiomyopathy     nonischemic  . GERD (gastroesophageal reflux disease)   . Erosive esophagitis     LA Classification Grade B  . Hyperlipidemia   . Osteopenia   . Hiatal hernia   . Melanoma     basil cell  . Diabetes mellitus     pt states she is not diabetic  . Sessile colonic polyp 10/2011    adenoma    Current Outpatient Prescriptions  Medication Sig Dispense Refill  . amLODipine (NORVASC) 2.5 MG tablet Take 1 tablet (2.5 mg total) by mouth daily. 90 tablet 3  . Calcium Carbonate-Vitamin D (CALCIUM 600 + D PO) Take 1 tablet by mouth 2 (two) times daily.    . carvedilol (COREG) 12.5 MG tablet Take 1 tablet (12.5 mg total) by mouth 2 (two) times daily with a meal. 180 tablet 3  . enalapril (VASOTEC) 10 MG tablet Take 1 tablet (10 mg total) by mouth daily. 90 tablet 3  . escitalopram (LEXAPRO) 20 MG tablet Take 1 tablet (20 mg total) by mouth daily. 30 tablet 11  . fluocinonide-emollient (LIDEX-E) 0.05 % cream Apply 1 application topically 2 (two) times daily. 30 g 0  . Multiple Vitamin (MULTIVITAMIN) capsule Take 1 capsule by mouth daily.      . pantoprazole (PROTONIX) 40 MG  tablet TAKE ONE TABLET BY MOUTH TWICE DAILY 180 tablet 0  . promethazine (PHENERGAN) 12.5 MG tablet Take 1 tablet (12.5 mg total) by mouth every 8 (eight) hours as needed for nausea or vomiting. 20 tablet 0  . simvastatin (ZOCOR) 20 MG tablet TAKE ONE TABLET BY MOUTH AT BEDTIME 90 tablet 2  . zolpidem (AMBIEN) 10 MG tablet TAKE ONE TABLET BY MOUTH AT BEDTIME 30 tablet 0   No current facility-administered medications for this visit.    Allergies  Allergen Reactions  . Penicillins Hives  . Lansoprazole     REACTION: headaches  . Sulfa Antibiotics Hives  . Ceclor [Cefaclor] Itching and Rash  . Ceftin [Cefuroxime Axetil] Rash  . Clindamycin/Lincomycin Itching and Rash  . Latex Itching and Rash  . Septra [Sulfamethoxazole-Trimethoprim] Rash    Family History  Problem Relation Age of Onset  . Heart disease Father   . Alzheimer's disease Mother   . Colon cancer Neg Hx   . Stomach cancer Neg Hx   . Rectal cancer Neg Hx   . Esophageal cancer Neg Hx   . Arthritis/Rheumatoid Sister     Social History   Social History  . Marital Status: Married    Spouse  Name: N/A  . Number of Children: 3  . Years of Education: N/A   Occupational History  . Housewife    Social History Main Topics  . Smoking status: Former Smoker    Quit date: 06/03/1981  . Smokeless tobacco: Never Used  . Alcohol Use: 1.8 oz/week    3 Glasses of wine per week     Comment: Wine on occasional  . Drug Use: No  . Sexual Activity: Not on file   Other Topics Concern  . Not on file   Social History Narrative   Lives alone in two-story home.     Her husband recently passed away unexpectedly.  They have three children.     Constitutional: Denies fever, malaise, fatigue, headache or abrupt weight changes.  Respiratory: Denies difficulty breathing, shortness of breath, cough or sputum production.   Cardiovascular: Denies chest pain, chest tightness, palpitations or swelling in the hands or feet.  Skin: Pt  reports rash. Denies ulcercations.   No other specific complaints in a complete review of systems (except as listed in HPI above).  Objective:   Physical Exam   BP 148/82 mmHg  Pulse 76  Temp(Src) 98.2 F (36.8 C) (Oral)  Wt 128 lb 4 oz (58.174 kg)  SpO2 97% Wt Readings from Last 3 Encounters:  12/04/14 128 lb 4 oz (58.174 kg)  10/27/14 130 lb 8 oz (59.194 kg)  10/17/14 131 lb (59.421 kg)    General: Appears her stated age, well developed, well nourished in NAD. Skin: Scattered, weeping, vesicular rash on erythematous base, noted on arms, abdomen and lower legs. Cardiovascular: Normal rate and rhythm. S1,S2 noted.   Pulmonary/Chest: Normal effort and positive vesicular breath sounds. No respiratory distress. No wheezes, rales or ronchi noted.    BMET    Component Value Date/Time   NA 137 02/24/2014 1049   K 4.1 02/24/2014 1049   CL 103 02/24/2014 1049   CO2 26 02/24/2014 1049   GLUCOSE 102* 02/24/2014 1049   BUN 14 02/24/2014 1049   CREATININE 0.7 02/24/2014 1049   CALCIUM 9.6 02/24/2014 1049   GFRNONAA 110.83 10/11/2009 1058   GFRAA 130 11/22/2007 1514    Lipid Panel     Component Value Date/Time   CHOL 147 02/24/2014 1049   TRIG 149.0 02/24/2014 1049   HDL 47.40 02/24/2014 1049   CHOLHDL 3 02/24/2014 1049   VLDL 29.8 02/24/2014 1049   LDLCALC 70 02/24/2014 1049    CBC    Component Value Date/Time   WBC 6.0 02/24/2014 1049   RBC 4.44 02/24/2014 1049   HGB 13.0 02/24/2014 1049   HCT 38.8 02/24/2014 1049   PLT 242.0 02/24/2014 1049   MCV 87.4 02/24/2014 1049   MCH 28.0 03/11/2012 1341   MCHC 33.4 02/24/2014 1049   RDW 13.3 02/24/2014 1049   LYMPHSABS 1.4 02/22/2013 0840   MONOABS 0.5 02/22/2013 0840   EOSABS 0.3 02/22/2013 0840   BASOSABS 0.0 02/22/2013 0840    Hgb A1C Lab Results  Component Value Date   HGBA1C 5.6 01/13/2013        Assessment & Plan:   Contact Dermatitis due to plant:  80 mg Depo IM today eRx for Pred Taper eRx for  Atarax 10 mg TID prn Try an oatmeal bath to see if this helps soothe the skin  RTC as needed or if symptoms persist or worsen

## 2014-12-04 NOTE — Progress Notes (Signed)
Pre visit review using our clinic review tool, if applicable. No additional management support is needed unless otherwise documented below in the visit note. 

## 2014-12-05 ENCOUNTER — Telehealth: Payer: Self-pay | Admitting: Family Medicine

## 2014-12-05 NOTE — Telephone Encounter (Signed)
Patient Name: [name] DOB: 31-Jul-1945 Initial Comment Caller states she has poison ivy. She is taking prednisone and now her right arm is swollen Nurse Assessment Nurse: Marcelline Deist, RN, Kermit Balo Date/Time (Eastern Time): 12/05/2014 12:17:14 PM Confirm and document reason for call. If symptomatic, describe symptoms. ---Caller states she has poison ivy. She is taking prednisone and now her right arm is swollen. It was swollen & red already, but she is wondering if it is from the Prednisone, or the poison ivy. Was seen yesterday, received a shot for the inflammation & itching there at office. The skin is tight & fluid filled with blisters on the top of her hand, especially on the right. Has the patient traveled out of the country within the last 30 days? ---Not Applicable Does the patient require triage? ---No Please document clinical information provided and list any resource used. ---Nurse provided general information/exit care advice on poison ivy & side effects of Prednisone for caller. Referenced Drugs.com which lists swelling of hands & fingers as a potential common side effect. Caller advised to try to elevate arm & hand as much as possible for the swelling & that it could take a day or 2 to notice improvement once the rx gets in her system. Caller asked if it would be ok to put a bandage or cover the areas between fingers that are oozing. Nurse responded that a loose gauze covering would be ok. Also, for unopened areas that itch she can use 1% Hydrocortisone cream that has been in refrigerator, or baking soda soaks for itching. Caller verbalized understanding. Guidelines Guideline Title Affirmed Question Affirmed Notes Final Disposition User Clinical Call Dupont, RN, Kermit Balo

## 2014-12-07 ENCOUNTER — Other Ambulatory Visit: Payer: Self-pay | Admitting: Internal Medicine

## 2014-12-07 DIAGNOSIS — L237 Allergic contact dermatitis due to plants, except food: Secondary | ICD-10-CM | POA: Diagnosis not present

## 2014-12-08 ENCOUNTER — Other Ambulatory Visit: Payer: Self-pay | Admitting: Internal Medicine

## 2014-12-25 DIAGNOSIS — H4311 Vitreous hemorrhage, right eye: Secondary | ICD-10-CM | POA: Diagnosis not present

## 2014-12-27 ENCOUNTER — Other Ambulatory Visit: Payer: Self-pay | Admitting: Family Medicine

## 2014-12-27 ENCOUNTER — Other Ambulatory Visit: Payer: Self-pay | Admitting: Internal Medicine

## 2014-12-27 NOTE — Telephone Encounter (Signed)
Last f/u 03/2014. 

## 2014-12-27 NOTE — Telephone Encounter (Signed)
Rx called in to requested pharmacy 

## 2015-01-09 DIAGNOSIS — H43811 Vitreous degeneration, right eye: Secondary | ICD-10-CM | POA: Diagnosis not present

## 2015-01-09 DIAGNOSIS — H4311 Vitreous hemorrhage, right eye: Secondary | ICD-10-CM | POA: Diagnosis not present

## 2015-01-16 ENCOUNTER — Other Ambulatory Visit: Payer: Self-pay | Admitting: Primary Care

## 2015-01-16 NOTE — Telephone Encounter (Signed)
Electronically refill request for   promethazine (PHENERGAN) 12.5 MG tablet   Take 1 tablet (12.5 mg total) by mouth every 8 (eight) hours as needed for nausea or vomiting.  Dispense: 20 tablet   Refills: 0     Last prescribed on 07/24/2014. Last seen on 12/04/2014. No future appointment.

## 2015-01-17 ENCOUNTER — Other Ambulatory Visit: Payer: Self-pay | Admitting: Internal Medicine

## 2015-01-23 ENCOUNTER — Encounter: Payer: Self-pay | Admitting: Gastroenterology

## 2015-01-29 ENCOUNTER — Other Ambulatory Visit: Payer: Self-pay | Admitting: Family Medicine

## 2015-01-29 NOTE — Telephone Encounter (Signed)
Last f/u 03/2014. 

## 2015-01-29 NOTE — Telephone Encounter (Signed)
Rx called in to requested pharmacy 

## 2015-02-09 DIAGNOSIS — H43811 Vitreous degeneration, right eye: Secondary | ICD-10-CM | POA: Diagnosis not present

## 2015-02-26 ENCOUNTER — Other Ambulatory Visit: Payer: Self-pay | Admitting: Family Medicine

## 2015-02-26 NOTE — Telephone Encounter (Signed)
Last f/u 03/2014-CPE 

## 2015-02-27 NOTE — Telephone Encounter (Signed)
Rx called to pharmacy as instructed. 

## 2015-03-21 DIAGNOSIS — L570 Actinic keratosis: Secondary | ICD-10-CM | POA: Diagnosis not present

## 2015-03-21 DIAGNOSIS — L821 Other seborrheic keratosis: Secondary | ICD-10-CM | POA: Diagnosis not present

## 2015-03-21 DIAGNOSIS — Z85828 Personal history of other malignant neoplasm of skin: Secondary | ICD-10-CM | POA: Diagnosis not present

## 2015-03-22 ENCOUNTER — Encounter: Payer: Self-pay | Admitting: Internal Medicine

## 2015-03-22 ENCOUNTER — Ambulatory Visit (INDEPENDENT_AMBULATORY_CARE_PROVIDER_SITE_OTHER): Payer: Commercial Managed Care - HMO | Admitting: Internal Medicine

## 2015-03-22 VITALS — BP 124/74 | HR 76 | Ht 62.0 in | Wt 132.0 lb

## 2015-03-22 DIAGNOSIS — E7800 Pure hypercholesterolemia, unspecified: Secondary | ICD-10-CM | POA: Diagnosis not present

## 2015-03-22 DIAGNOSIS — I429 Cardiomyopathy, unspecified: Secondary | ICD-10-CM | POA: Diagnosis not present

## 2015-03-22 DIAGNOSIS — I428 Other cardiomyopathies: Secondary | ICD-10-CM | POA: Diagnosis not present

## 2015-03-22 LAB — CBC
HCT: 36.9 % (ref 36.0–46.0)
HEMOGLOBIN: 12.5 g/dL (ref 12.0–15.0)
MCH: 29.8 pg (ref 26.0–34.0)
MCHC: 33.9 g/dL (ref 30.0–36.0)
MCV: 87.9 fL (ref 78.0–100.0)
MPV: 9.9 fL (ref 8.6–12.4)
PLATELETS: 235 10*3/uL (ref 150–400)
RBC: 4.2 MIL/uL (ref 3.87–5.11)
RDW: 12.5 % (ref 11.5–15.5)
WBC: 5.1 10*3/uL (ref 4.0–10.5)

## 2015-03-22 LAB — LIPID PANEL
CHOLESTEROL: 169 mg/dL (ref 125–200)
HDL: 48 mg/dL (ref >=46)
LDL Cholesterol: 68 mg/dL (ref <130)
TRIGLYCERIDES: 265 mg/dL — AB (ref <150)
Total CHOL/HDL Ratio: 3.5 Ratio (ref <=5.0)
VLDL: 53 mg/dL — AB (ref <30)

## 2015-03-22 LAB — BASIC METABOLIC PANEL
BUN: 14 mg/dL (ref 7–25)
CALCIUM: 9.4 mg/dL (ref 8.6–10.4)
CO2: 26 mmol/L (ref 20–31)
CREATININE: 0.56 mg/dL — AB (ref 0.60–0.93)
Chloride: 105 mmol/L (ref 98–110)
GLUCOSE: 95 mg/dL (ref 65–99)
Potassium: 4 mmol/L (ref 3.5–5.3)
SODIUM: 140 mmol/L (ref 135–146)

## 2015-03-22 MED ORDER — CARVEDILOL 12.5 MG PO TABS: 12.5000 mg | ORAL_TABLET | Freq: Two times a day (BID) | ORAL | Status: DC

## 2015-03-22 MED ORDER — AMLODIPINE BESYLATE 2.5 MG PO TABS: 2.5000 mg | ORAL_TABLET | Freq: Every day | ORAL | Status: DC

## 2015-03-22 MED ORDER — ENALAPRIL MALEATE 10 MG PO TABS: 10.0000 mg | ORAL_TABLET | Freq: Every day | ORAL | Status: DC

## 2015-03-22 MED ORDER — SIMVASTATIN 20 MG PO TABS: 20.0000 mg | ORAL_TABLET | Freq: Every day | ORAL | Status: DC

## 2015-03-22 NOTE — Progress Notes (Signed)
Cardiology Office Note   Date:  03/22/2015   ID:  [name], DOB Jun 30, 1945, MRN [id number]  PCP:  Arnette Norris, MD  Cardiologist:   Dorris Carnes, MD   No chief complaint on file.  F/U of NICM and LBBB   History of Present Illness: [name] is a 70 y.o. female with a history f LBBB, NICM (normalization of LVEF) Also a history of CP (normal myoview in Jan 2014) and significant GERD I saw her 1 year ago  Since I saw her she remains busy  Helps with grandchildrenDoes have some SOB with actvity which she attrib to age Has occasional palpitations  Short lived Also has some episodes fo dizziness  Again Short lived  Not always associated wit change in position      Current Outpatient Prescriptions  Medication Sig Dispense Refill  . amLODipine (NORVASC) 2.5 MG tablet Take 1 tablet (2.5 mg total) by mouth daily. 90 tablet 3  . Calcium Carbonate-Vitamin D (CALCIUM 600 + D PO) Take 1 tablet by mouth 2 (two) times daily.    . carvedilol (COREG) 12.5 MG tablet Take 1 tablet (12.5 mg total) by mouth 2 (two) times daily with a meal. 180 tablet 3  . enalapril (VASOTEC) 10 MG tablet TAKE ONE TABLET BY MOUTH EVERY DAY 90 tablet 0  . escitalopram (LEXAPRO) 20 MG tablet Take 1 tablet (20 mg total) by mouth daily. 30 tablet 11  . Multiple Vitamin (MULTIVITAMIN) capsule Take 1 capsule by mouth daily.      . pantoprazole (PROTONIX) 40 MG tablet TAKE ONE TABLET BY MOUTH TWICE DAILY 180 tablet 0  . promethazine (PHENERGAN) 12.5 MG tablet TAKE ONE TABLET BY MOUTH EVERY EIGHT HOURS AS NEEDED FOR NAUSEA / VOMITING 20 tablet 0  . simvastatin (ZOCOR) 20 MG tablet TAKE ONE TABLET BY MOUTH AT BEDTIME 90 tablet 1  . zolpidem (AMBIEN) 10 MG tablet TAKE ONE TABLET AT BEDTIME 30 tablet 0   No current facility-administered medications for this visit.    Allergies:   Penicillins; Lansoprazole; Sulfa antibiotics; Ceclor; Ceftin; Clindamycin/lincomycin; Latex; and Septra   Past Medical History    Diagnosis Date  . Migraine   . Cardiomyopathy     nonischemic  . GERD (gastroesophageal reflux disease)   . Erosive esophagitis     LA Classification Grade B  . Hyperlipidemia   . Osteopenia   . Hiatal hernia   . Melanoma ([place])     basil cell  . Diabetes mellitus     pt states she is not diabetic  . Sessile colonic polyp 10/2011    adenoma    Past Surgical History  Procedure Laterality Date  . Tubal ligation    . Vaginal hysterectomy    . Appendectomy       Social History:  The patient  reports that she quit smoking about 33 years ago. She has never used smokeless tobacco. She reports that she drinks about 1.8 oz of alcohol per week. She reports that she does not use illicit drugs.   Family History:  The patient's family history includes Alzheimer's disease in her mother; Arthritis/Rheumatoid in her sister; Heart disease in her father. There is no history of Colon cancer, Stomach cancer, Rectal cancer, or Esophageal cancer.    ROS:  Please see the history of present illness. All other systems are reviewed and  Negative to the above problem except as noted.    PHYSICAL EXAM: VS:  BP 124/74 mmHg  Pulse  76  Ht 5\' 2"  (1.575 m)  Wt 59.875 kg (132 lb)  BMI 24.14 kg/m2  SpO2 98%  GEN: Well nourished, well developed, in no acute distress HEENT: normal Neck: no JVD, carotid bruits, or masses Cardiac: RRR; no murmurs, rubs, or gallops,no edema  Respiratory:  clear to auscultation bilaterally, normal work of breathing GI: soft, nontender, nondistended, + BS  No hepatomegaly  MS: no deformity Moving all extremities   Skin: warm and dry, no rash Neuro:  Strength and sensation are intact Psych: euthymic mood, full affect   EKG:  EKG is ordered today.  SR 63 bpm     Lipid Panel    Component Value Date/Time   CHOL 147 02/24/2014 1049   TRIG 149.0 02/24/2014 1049   HDL 47.40 02/24/2014 1049   CHOLHDL 3 02/24/2014 1049   VLDL 29.8 02/24/2014 1049   LDLCALC 70 02/24/2014  1049   LDLDIRECT 89.3 04/03/2011 1009      Wt Readings from Last 3 Encounters:  03/22/15 59.875 kg (132 lb)  12/04/14 58.174 kg (128 lb 4 oz)  10/27/14 59.194 kg (130 lb 8 oz)      ASSESSMENT AND PLAN:  1  Dizziness  Very transient spells  I have asked her to call next month with how she is feeling  IF continues or worsens would set up for event monitor  Conider treadmill  To evaluate for chronotropic competence  2.  Hx chronic systolic CHF  Pt has had normlaization of function  Follow  Volume status looks OK       Signed, Dorris Carnes, MD  03/22/2015 9:15 AM    [address], [place], [place]  [id number] Phone: [phone]; Fax: [phone]

## 2015-03-22 NOTE — Patient Instructions (Signed)
Your physician recommends that you continue on your current medications as directed. Please refer to the Current Medication list given to you today. Your physician recommends that you return for lab work today (cbc, lipids, bmet)  Your physician wants you to follow-up in: 1 year with Dr. Harrington Challenger.  You will receive a reminder letter in the mail two months in advance. If you don't receive a letter, please call our office to schedule the follow-up appointment.

## 2015-03-26 ENCOUNTER — Encounter: Payer: Self-pay | Admitting: Family Medicine

## 2015-03-26 ENCOUNTER — Ambulatory Visit (INDEPENDENT_AMBULATORY_CARE_PROVIDER_SITE_OTHER): Payer: Commercial Managed Care - HMO | Admitting: Family Medicine

## 2015-03-26 VITALS — BP 126/62 | HR 67 | Temp 97.9°F | Wt 132.2 lb

## 2015-03-26 DIAGNOSIS — F32A Depression, unspecified: Secondary | ICD-10-CM

## 2015-03-26 DIAGNOSIS — F329 Major depressive disorder, single episode, unspecified: Secondary | ICD-10-CM | POA: Diagnosis not present

## 2015-03-26 DIAGNOSIS — F4321 Adjustment disorder with depressed mood: Secondary | ICD-10-CM

## 2015-03-26 DIAGNOSIS — G4700 Insomnia, unspecified: Secondary | ICD-10-CM

## 2015-03-26 MED ORDER — ZOLPIDEM TARTRATE 10 MG PO TABS: 10.0000 mg | ORAL_TABLET | Freq: Every day | ORAL | Status: DC

## 2015-03-26 MED ORDER — ESCITALOPRAM OXALATE 20 MG PO TABS: 20.0000 mg | ORAL_TABLET | Freq: Every day | ORAL | Status: DC

## 2015-03-26 NOTE — Assessment & Plan Note (Signed)
>  25 minutes spent in face to face time with patient, >50% spent in counselling or coordination of care She would like to continue lexapro which is reasonable, eRx sent.

## 2015-03-26 NOTE — Progress Notes (Signed)
Pre visit review using our clinic review tool, if applicable. No additional management support is needed unless otherwise documented below in the visit note. 

## 2015-03-26 NOTE — Progress Notes (Signed)
Subjective:    Patient ID: [name], female    DOB: Dec 26, 1945, 70 y.o.   MRN: [id number]  HPI  Very pleasant 70 yo female here today to f/u insomnia and depression. This started after she unexpectedly lost her husband in 06/2012.   She feels lexapro 20 mg daily is working well and feels she needs to continue this indefinitely.   Insomnia-   Tried trazodone shortly after her husband passed away.  It was not effective and made her feel "drugged" the following day, so we started prn ambien.  Spends a lot of time back and forth from [place] spending time with her kids and grandkids which she loves.    Still has not sold her home here.  Wants to downsize eventually.      Review of Systems  Constitutional: Negative.   Musculoskeletal: Negative.   Skin: Negative.   Neurological: Negative.   Psychiatric/Behavioral: Negative.   All other systems reviewed and are negative.       Past Medical History  Diagnosis Date  . Migraine   . Cardiomyopathy     nonischemic  . GERD (gastroesophageal reflux disease)   . Erosive esophagitis     LA Classification Grade B  . Hyperlipidemia   . Osteopenia   . Hiatal hernia   . Melanoma ([place])     basil cell  . Diabetes mellitus     pt states she is not diabetic  . Sessile colonic polyp 10/2011    adenoma    Current Outpatient Prescriptions  Medication Sig Dispense Refill  . amLODipine (NORVASC) 2.5 MG tablet Take 1 tablet (2.5 mg total) by mouth daily. 90 tablet 3  . Calcium Carbonate-Vitamin D (CALCIUM 600 + D PO) Take 1 tablet by mouth 2 (two) times daily.    . carvedilol (COREG) 12.5 MG tablet Take 1 tablet (12.5 mg total) by mouth 2 (two) times daily with a meal. 180 tablet 3  . enalapril (VASOTEC) 10 MG tablet Take 1 tablet (10 mg total) by mouth daily. 90 tablet 3  . escitalopram (LEXAPRO) 20 MG tablet Take 1 tablet (20 mg total) by mouth daily. 30 tablet 11  . Multiple Vitamin (MULTIVITAMIN) capsule Take 1 capsule by mouth  daily.      . pantoprazole (PROTONIX) 40 MG tablet TAKE ONE TABLET BY MOUTH TWICE DAILY 180 tablet 0  . promethazine (PHENERGAN) 12.5 MG tablet TAKE ONE TABLET BY MOUTH EVERY EIGHT HOURS AS NEEDED FOR NAUSEA / VOMITING 20 tablet 0  . simvastatin (ZOCOR) 20 MG tablet Take 1 tablet (20 mg total) by mouth at bedtime. 90 tablet 3  . zolpidem (AMBIEN) 10 MG tablet TAKE ONE TABLET AT BEDTIME 30 tablet 0   No current facility-administered medications for this visit.    Allergies  Allergen Reactions  . Penicillins Hives  . Lansoprazole     REACTION: headaches  . Sulfa Antibiotics Hives  . Ceclor [Cefaclor] Itching and Rash  . Ceftin [Cefuroxime Axetil] Rash  . Clindamycin/Lincomycin Itching and Rash  . Latex Itching and Rash  . Septra [Sulfamethoxazole-Trimethoprim] Rash    Family History  Problem Relation Age of Onset  . Heart disease Father   . Alzheimer's disease Mother   . Colon cancer Neg Hx   . Stomach cancer Neg Hx   . Rectal cancer Neg Hx   . Esophageal cancer Neg Hx   . Arthritis/Rheumatoid Sister     Social History   Social History  . Marital Status: Married  Spouse Name: N/A  . Number of Children: 3  . Years of Education: N/A   Occupational History  . Housewife    Social History Main Topics  . Smoking status: Former Smoker    Quit date: 06/03/1981  . Smokeless tobacco: Never Used  . Alcohol Use: 1.8 oz/week    3 Glasses of wine per week     Comment: Wine on occasional  . Drug Use: No  . Sexual Activity: Not on file   Other Topics Concern  . Not on file   Social History Narrative   Lives alone in two-story home.     Her husband recently passed away unexpectedly.  They have three children.       Objective:   Physical Exam  Constitutional: She is oriented to person, place, and time. She appears well-developed and well-nourished. No distress.  HENT:  Head: Normocephalic.  Eyes: Conjunctivae are normal.  Cardiovascular: Normal rate and regular  rhythm.   Pulmonary/Chest: Effort normal and breath sounds normal.  Musculoskeletal: Normal range of motion.  Neurological: She is alert and oriented to person, place, and time. No cranial nerve deficit.  Skin: Skin is warm and dry.  Psychiatric: She has a normal mood and affect. Her behavior is normal. Judgment and thought content normal.  Nursing note and vitals reviewed.   BP 126/62 mmHg  Pulse 67  Temp(Src) 97.9 F (36.6 C) (Oral)  Wt 132 lb 4 oz (59.988 kg)  SpO2 97% Wt Readings from Last 3 Encounters:  03/26/15 132 lb 4 oz (59.988 kg)  03/22/15 132 lb (59.875 kg)  12/04/14 128 lb 4 oz (58.174 kg)            Assessment & Plan:

## 2015-03-26 NOTE — Assessment & Plan Note (Signed)
  The problem of recurrent insomnia is discussed. Avoidance of caffeine sources is strongly encouraged. Sleep hygiene issues are reviewed. The use of sedative hypnotics is intended for temporary relief but she feels she cannot sleep without ambien and is willing to take the risk of using this medication nightly- discussed fall risk, addiction potential.  She is aware.  Rx printed and given to pt.

## 2015-03-28 DIAGNOSIS — L570 Actinic keratosis: Secondary | ICD-10-CM | POA: Diagnosis not present

## 2015-03-28 DIAGNOSIS — R52 Pain, unspecified: Secondary | ICD-10-CM | POA: Diagnosis not present

## 2015-05-11 ENCOUNTER — Other Ambulatory Visit: Payer: Self-pay | Admitting: Family Medicine

## 2015-05-11 MED ORDER — OSELTAMIVIR PHOSPHATE 75 MG PO CAPS: 75.0000 mg | ORAL_CAPSULE | Freq: Every day | ORAL | Status: DC

## 2015-05-11 NOTE — Telephone Encounter (Signed)
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

## 2015-05-11 NOTE — Telephone Encounter (Signed)
Ok to send eRx sent for prophylactic dose entered.  I couldn't find [address] in [place].

## 2015-05-11 NOTE — Telephone Encounter (Signed)
Satia called back with phone number to pharm[facility] [phone]

## 2015-05-11 NOTE — Telephone Encounter (Signed)
Ok to call in as requested.

## 2015-05-15 ENCOUNTER — Telehealth: Payer: Self-pay | Admitting: Gastroenterology

## 2015-05-15 MED ORDER — PANTOPRAZOLE SODIUM 40 MG PO TBEC: 40.0000 mg | DELAYED_RELEASE_TABLET | Freq: Two times a day (BID) | ORAL | Status: DC

## 2015-05-15 NOTE — Telephone Encounter (Signed)
OK to refill until her REV 

## 2015-05-15 NOTE — Telephone Encounter (Signed)
Patient states she will be out of Protonix next week and she knows she is over due for a follow up appt but she made and appt for April. Informed patient that if she was taking it twice daily then she would be out out of medication in November 2016. Patient states she does not take it twice daily every day since her husband died. She states she does still take it every day though. Informed patient that I will ask Dr. Fuller Plan if I can send in a refill until her scheduled appt in April. Please advise Dr. Fuller Plan.

## 2015-05-15 NOTE — Telephone Encounter (Signed)
Prescription sent to patient's pharmacy.

## 2015-05-23 DIAGNOSIS — L570 Actinic keratosis: Secondary | ICD-10-CM | POA: Diagnosis not present

## 2015-05-23 DIAGNOSIS — R52 Pain, unspecified: Secondary | ICD-10-CM | POA: Diagnosis not present

## 2015-05-28 ENCOUNTER — Other Ambulatory Visit: Payer: Self-pay | Admitting: Family Medicine

## 2015-05-29 ENCOUNTER — Other Ambulatory Visit: Payer: Self-pay | Admitting: Family Medicine

## 2015-05-29 NOTE — Telephone Encounter (Signed)
Last f/u 03/2015 

## 2015-05-29 NOTE — Telephone Encounter (Signed)
Rx called in to requested pharmacy 

## 2015-06-04 ENCOUNTER — Encounter: Payer: Self-pay | Admitting: Family Medicine

## 2015-06-04 ENCOUNTER — Ambulatory Visit (INDEPENDENT_AMBULATORY_CARE_PROVIDER_SITE_OTHER): Payer: Commercial Managed Care - HMO | Admitting: Family Medicine

## 2015-06-04 VITALS — BP 120/70 | HR 63 | Temp 98.2°F | Ht 62.0 in | Wt 134.8 lb

## 2015-06-04 DIAGNOSIS — G5603 Carpal tunnel syndrome, bilateral upper limbs: Secondary | ICD-10-CM

## 2015-06-04 DIAGNOSIS — M25531 Pain in right wrist: Secondary | ICD-10-CM | POA: Diagnosis not present

## 2015-06-04 DIAGNOSIS — M25532 Pain in left wrist: Secondary | ICD-10-CM

## 2015-06-04 MED ORDER — METHYLPREDNISOLONE ACETATE 40 MG/ML IJ SUSP
20.0000 mg | Freq: Once | INTRAMUSCULAR | Status: AC
  Administered 2015-06-04: 20 mg via INTRA_ARTICULAR

## 2015-06-04 NOTE — Progress Notes (Signed)
Dr. Karleen Hampshire T. Harvest Deist, MD, CAQ [facility] [address] [place], [address] Phone: [phone] Fax: [phone]  06/04/2015  Patient: [name], MRN: [id number], DOB: 01/17/1946, 70 y.o.  Primary Physician:  Ruthe Mannan, MD   Chief Complaint  Patient presents with  . Wrist Pain    Wants Bilateral Wrist Injections    Procedure only: Known CTS. Improvement last time x 12 months.   Worsening clinically. Recommend hand consultation.  CTS inj.  CTS Injection, R Verbal consent was obtained. Risks (including rare risk of infection), benefits, and alternatives were discussed. Prepped with Chloraprep and Ethyl Chloride used for anesthesia. Under sterile conditions,  the patient was injected just ulnar to the palmaris longus tendon at the wrist flexion crease.  The needle was inserted at 45 degree angle aiming distally. Aspiration showed no blood. Medication flowed freely without resistance.  Needle size: 22 gauge 1 1/2 inch Injection: 1/2 cc of Lidocaine 1% and Depo-Medrol 20 mg   CTS Injection, L Verbal consent was obtained. Risks (including rare risk of infection), benefits, and alternatives were discussed. Prepped with Chloraprep and Ethyl Chloride used for anesthesia. Under sterile conditions,  the patient was injected just ulnar to the palmaris longus tendon at the wrist flexion crease.  The needle was inserted at 45 degree angle aiming distally. Aspiration showed no blood. Medication flowed freely without resistance.  Needle size: 22 gauge 1 1/2 inch Injection: 1/2 cc of Lidocaine 1% and Depo-Medrol 20 mg   Signed,  Sarah Langham T. Caitlinn Klinker, MD

## 2015-06-04 NOTE — Progress Notes (Signed)
Pre visit review using our clinic review tool, if applicable. No additional management support is needed unless otherwise documented below in the visit note. 

## 2015-06-14 DIAGNOSIS — L821 Other seborrheic keratosis: Secondary | ICD-10-CM | POA: Diagnosis not present

## 2015-06-14 DIAGNOSIS — L570 Actinic keratosis: Secondary | ICD-10-CM | POA: Diagnosis not present

## 2015-06-25 ENCOUNTER — Other Ambulatory Visit: Payer: Self-pay | Admitting: Family Medicine

## 2015-06-25 NOTE — Telephone Encounter (Signed)
Received refill electronically Last refill Zolpidem 3/21/817 #30 Last refill Promethazine 01/16/15 #20 Last office visit 06/04/15/acute

## 2015-06-25 NOTE — Telephone Encounter (Signed)
Rx called to pharmacy as instructed. 

## 2015-07-02 ENCOUNTER — Ambulatory Visit: Payer: Commercial Managed Care - HMO | Admitting: Family Medicine

## 2015-07-03 ENCOUNTER — Ambulatory Visit (INDEPENDENT_AMBULATORY_CARE_PROVIDER_SITE_OTHER): Payer: Commercial Managed Care - HMO | Admitting: Gastroenterology

## 2015-07-03 ENCOUNTER — Encounter: Payer: Self-pay | Admitting: Gastroenterology

## 2015-07-03 VITALS — BP 102/60 | HR 72 | Ht 62.0 in | Wt 131.2 lb

## 2015-07-03 DIAGNOSIS — K2100 Gastro-esophageal reflux disease with esophagitis, without bleeding: Secondary | ICD-10-CM

## 2015-07-03 DIAGNOSIS — Z86010 Personal history of colonic polyps: Secondary | ICD-10-CM

## 2015-07-03 MED ORDER — PANTOPRAZOLE SODIUM 40 MG PO TBEC: 40.0000 mg | DELAYED_RELEASE_TABLET | Freq: Two times a day (BID) | ORAL | Status: DC

## 2015-07-03 NOTE — Progress Notes (Signed)
    History of Present Illness: This is a 70 year old female returning for follow-up of GERD with a history of erosive esophagitis. Symptoms are under very good control on pantoprazole 40 mg twice daily. She takes an occasional Pepcid AC for breakthrough symptoms, perhaps once or twice per month. She has no gastrointestinal complaints. She states she hurt her lower back shooting baskets with her grandson.  Current Medications, Allergies, Past Medical History, Past Surgical History, Family History and Social History were reviewed in [employer] record.  Physical Exam: General: Well developed, well nourished, no acute distress Head: Normocephalic and atraumatic Eyes:  sclerae anicteric, EOMI Ears: Normal auditory acuity Mouth: No deformity or lesions Lungs: Clear throughout to auscultation Heart: Regular rate and rhythm; no murmurs, rubs or bruits Abdomen: Soft, non tender and non distended. No masses, hepatosplenomegaly or hernias noted. Normal Bowel sounds Musculoskeletal: Symmetrical with no gross deformities  Pulses:  Normal pulses noted Extremities: No clubbing, cyanosis, edema or deformities noted Neurological: Alert oriented x 4, grossly nonfocal Psychological:  Alert and cooperative. Normal mood and affect  Assessment and Recommendations:  1. GERD with a history of erosive esophagitis. Continue pantoprazole 40 mg twice daily with all standard antireflux measures. Pepcid AC bid as needed for breakthrough symptoms.  2. Personal history of adenomatous colon polyps. Five-year interval surveillance colonoscopy recommended in August 2018.  3. Low back pain. Advised to follow-up with her PCP.

## 2015-07-03 NOTE — Patient Instructions (Signed)
We have sent the following medications to your pharmacy for you to pick up at your convenience: pantoprazole.   Thank you for choosing me and [facility].  Malcolm T. Stark, Jr., MD., FACG   

## 2015-07-04 ENCOUNTER — Ambulatory Visit (INDEPENDENT_AMBULATORY_CARE_PROVIDER_SITE_OTHER): Payer: Commercial Managed Care - HMO | Admitting: Primary Care

## 2015-07-04 ENCOUNTER — Encounter: Payer: Self-pay | Admitting: Primary Care

## 2015-07-04 ENCOUNTER — Other Ambulatory Visit: Payer: Self-pay | Admitting: Primary Care

## 2015-07-04 ENCOUNTER — Ambulatory Visit (INDEPENDENT_AMBULATORY_CARE_PROVIDER_SITE_OTHER)
Admission: RE | Admit: 2015-07-04 | Discharge: 2015-07-04 | Disposition: A | Discharge status: Home / Self Care | Payer: Commercial Managed Care - HMO | Source: Ambulatory Visit | Attending: Primary Care | Admitting: Primary Care

## 2015-07-04 VITALS — BP 102/60 | HR 63 | Temp 98.0°F | Ht 62.0 in | Wt 132.1 lb

## 2015-07-04 DIAGNOSIS — M4316 Spondylolisthesis, lumbar region: Secondary | ICD-10-CM | POA: Diagnosis not present

## 2015-07-04 DIAGNOSIS — M47816 Spondylosis without myelopathy or radiculopathy, lumbar region: Secondary | ICD-10-CM | POA: Diagnosis not present

## 2015-07-04 DIAGNOSIS — M545 Low back pain, unspecified: Secondary | ICD-10-CM

## 2015-07-04 MED ORDER — TIZANIDINE HCL 2 MG PO TABS: 2.0000 mg | ORAL_TABLET | Freq: Three times a day (TID) | ORAL | Status: DC | PRN

## 2015-07-04 NOTE — Patient Instructions (Signed)
Complete xray(s) prior to leaving today. I will notify you of your results once received.  You may take the Tizanidine tablets every 8 hours as needed for muscle spasms/back pain. Caution as this may cause drowsiness.  Continue application of heat, hot showers, and advance activity as tolerated.   I will be in touch with you soon.  It was a pleasure to see you today!  'Back Pain, Adult Back pain is very common in adults.The cause of back pain is rarely dangerous and the pain often gets better over time.The cause of your back pain may not be known. Some common causes of back pain include:  Strain of the muscles or ligaments supporting the spine.  Wear and tear (degeneration) of the spinal disks.  Arthritis.  Direct injury to the back. For many people, back pain may return. Since back pain is rarely dangerous, most people can learn to manage this condition on their own. HOME CARE INSTRUCTIONS Watch your back pain for any changes. The following actions may help to lessen any discomfort you are feeling:  Remain active. It is stressful on your back to sit or stand in one place for long periods of time. Do not sit, drive, or stand in one place for more than 30 minutes at a time. Take short walks on even surfaces as soon as you are able.Try to increase the length of time you walk each day.  Exercise regularly as directed by your health care provider. Exercise helps your back heal faster. It also helps avoid future injury by keeping your muscles strong and flexible.  Do not stay in bed.Resting more than 1-2 days can delay your recovery.  Pay attention to your body when you bend and lift. The most comfortable positions are those that put less stress on your recovering back. Always use proper lifting techniques, including:  Bending your knees.  Keeping the load close to your body.  Avoiding twisting.  Find a comfortable position to sleep. Use a firm mattress and lie on your side with  your knees slightly bent. If you lie on your back, put a pillow under your knees.  Avoid feeling anxious or stressed.Stress increases muscle tension and can worsen back pain.It is important to recognize when you are anxious or stressed and learn ways to manage it, such as with exercise.  Take medicines only as directed by your health care provider. Over-the-counter medicines to reduce pain and inflammation are often the most helpful.Your health care provider may prescribe muscle relaxant drugs.These medicines help dull your pain so you can more quickly return to your normal activities and healthy exercise.  Apply ice to the injured area:  Put ice in a plastic bag.  Place a towel between your skin and the bag.  Leave the ice on for 20 minutes, 2-3 times a day for the first 2-3 days. After that, ice and heat may be alternated to reduce pain and spasms.  Maintain a healthy weight. Excess weight puts extra stress on your back and makes it difficult to maintain good posture. SEEK MEDICAL CARE IF:  You have pain that is not relieved with rest or medicine.  You have increasing pain going down into the legs or buttocks.  You have pain that does not improve in one week.  You have night pain.  You lose weight.  You have a fever or chills. SEEK IMMEDIATE MEDICAL CARE IF:   You develop new bowel or bladder control problems.  You have unusual weakness or  numbness in your arms or legs.  You develop nausea or vomiting.  You develop abdominal pain.  You feel faint.   This information is not intended to replace advice given to you by your health care provider. Make sure you discuss any questions you have with your health care provider.   Document Released: 02/24/2005 Document Revised: 03/17/2014 Document Reviewed: 06/28/2013 Elsevier Interactive Patient Education [employer].

## 2015-07-04 NOTE — Progress Notes (Signed)
Subjective:    Patient ID: [name], female    DOB: 07-14-1945, 70 y.o.   MRN: [id number]  HPI  [name] is a 70 year old female who presents today with a chief complaint of back pain. Her pain is located to the left lower back and radiates down to her hip and left lower extremity. Her pain has been present for the past 9 days. She describes her pain as a spasm. Her pain is constant, during sitting, standing, driving, etc. She's tried limiting her activity, taken ibuprofen, applying heat without much improvement. Denies recent injury or trauma, numbness/tingling.   Review of Systems  Musculoskeletal: Positive for myalgias and back pain.  Neurological: Negative for numbness.       Past Medical History  Diagnosis Date  . Migraine   . Cardiomyopathy     nonischemic  . GERD (gastroesophageal reflux disease)   . Erosive esophagitis     LA Classification Grade B  . Hyperlipidemia   . Osteopenia   . Hiatal hernia   . Melanoma ([place])     basil cell  . Diabetes mellitus     pt states she is not diabetic  . Sessile colonic polyp 10/2011    adenoma     Social History   Social History  . Marital Status: Married    Spouse Name: N/A  . Number of Children: 3  . Years of Education: N/A   Occupational History  . Housewife    Social History Main Topics  . Smoking status: Former Smoker    Quit date: 06/03/1981  . Smokeless tobacco: Never Used  . Alcohol Use: 1.8 oz/week    3 Glasses of wine per week     Comment: Wine on occasional  . Drug Use: No  . Sexual Activity: Not on file   Other Topics Concern  . Not on file   Social History Narrative   Lives alone in two-story home.     Her husband recently passed away unexpectedly.  They have three children.    Past Surgical History  Procedure Laterality Date  . Tubal ligation    . Vaginal hysterectomy    . Appendectomy      Family History  Problem Relation Age of Onset  . Heart disease Father   . Alzheimer's  disease Mother   . Colon cancer Neg Hx   . Stomach cancer Neg Hx   . Rectal cancer Neg Hx   . Esophageal cancer Neg Hx   . Arthritis/Rheumatoid Sister     Allergies  Allergen Reactions  . Penicillins Hives  . Lansoprazole     REACTION: headaches  . Sulfa Antibiotics Hives  . Ceclor [Cefaclor] Itching and Rash  . Ceftin [Cefuroxime Axetil] Rash  . Clindamycin/Lincomycin Itching and Rash  . Latex Itching and Rash  . Lincomycin Hcl Itching and Rash  . Septra [Sulfamethoxazole-Trimethoprim] Rash    Current Outpatient Prescriptions on File Prior to Visit  Medication Sig Dispense Refill  . amLODipine (NORVASC) 2.5 MG tablet Take 1 tablet (2.5 mg total) by mouth daily. 90 tablet 3  . carvedilol (COREG) 12.5 MG tablet Take 1 tablet (12.5 mg total) by mouth 2 (two) times daily with a meal. 180 tablet 3  . enalapril (VASOTEC) 10 MG tablet Take 1 tablet (10 mg total) by mouth daily. 90 tablet 3  . escitalopram (LEXAPRO) 20 MG tablet Take 1 tablet (20 mg total) by mouth daily. 30 tablet 11  . Multiple Vitamin (MULTIVITAMIN)  capsule Take 1 capsule by mouth daily.      . pantoprazole (PROTONIX) 40 MG tablet Take 1 tablet (40 mg total) by mouth 2 (two) times daily. 60 tablet 11  . promethazine (PHENERGAN) 12.5 MG tablet TAKE ONE TABLET EVERY EIGHT HOURS AS NEEDED FOR NAUSEA / VOMITING 20 tablet 0  . simvastatin (ZOCOR) 20 MG tablet Take 1 tablet (20 mg total) by mouth at bedtime. 90 tablet 3  . zolpidem (AMBIEN) 10 MG tablet TAKE ONE TABLET AT BEDTIME 30 tablet 0   No current facility-administered medications on file prior to visit.    BP 102/60 mmHg  Pulse 63  Temp(Src) 98 F (36.7 C) (Oral)  Ht 5\' 2"  (1.575 m)  Wt 132 lb 1.9 oz (59.929 kg)  BMI 24.16 kg/m2  SpO2 97%    Objective:   Physical Exam  Constitutional: She appears well-nourished.  Cardiovascular: Normal rate and regular rhythm.   Pulmonary/Chest: Effort normal and breath sounds normal.  Musculoskeletal:       Lumbar  back: She exhibits decreased range of motion and pain. She exhibits no tenderness and no deformity.  Skin: Skin is warm and dry.          Assessment & Plan:  Back Pain:  Located to left lower back x 9 days. No improvement with OTC treatment and heat. No injury/trauma. Exam with decrease in ROM to lumbar spine. Non tender upon palpation. Could likely be related to muscle spasm, although due to movement of pain into lower extremity, will obtain lumbar imaging to rule out any abnormality. RX for Tizanidine sent to pharmacy. Discussed heat application, advance activity as tolerated.  Follow up PRN.

## 2015-07-04 NOTE — Progress Notes (Signed)
Pre visit review using our clinic review tool, if applicable. No additional management support is needed unless otherwise documented below in the visit note. 

## 2015-07-10 ENCOUNTER — Ambulatory Visit: Payer: Commercial Managed Care - HMO | Attending: Internal Medicine

## 2015-07-10 DIAGNOSIS — M545 Low back pain, unspecified: Secondary | ICD-10-CM

## 2015-07-10 DIAGNOSIS — R293 Abnormal posture: Secondary | ICD-10-CM | POA: Diagnosis not present

## 2015-07-10 DIAGNOSIS — M5442 Lumbago with sciatica, left side: Secondary | ICD-10-CM | POA: Insufficient documentation

## 2015-07-10 DIAGNOSIS — M5441 Lumbago with sciatica, right side: Secondary | ICD-10-CM | POA: Diagnosis not present

## 2015-07-10 NOTE — Patient Instructions (Signed)
Abduction    Lift leg up toward ceiling. Return. Keep foot parallel to floor and leg in line with body. Repeat __10__ times each leg and then repeat. Do _2___ sessions per day.   Knee-to-Chest Stretch: Unilateral    With hand behind right knee, pull knee in to chest until a comfortable stretch is felt in lower back and buttocks. Keep back relaxed. Hold _30___ seconds. Repeat _3___ times per set. Do __3__ sets per session/leg. Do _2___ sessions per day.

## 2015-07-11 DIAGNOSIS — R52 Pain, unspecified: Secondary | ICD-10-CM | POA: Diagnosis not present

## 2015-07-11 DIAGNOSIS — L570 Actinic keratosis: Secondary | ICD-10-CM | POA: Diagnosis not present

## 2015-07-12 NOTE — Therapy (Signed)
[facility] [address], [place], [address] Phone: [phone]   Fax:  [phone]  Physical Therapy Evaluation  Patient Details  Name: [name] MRN: [id number] Date of Birth: 19-Feb-1946 Referring Provider: Alma [name]  Encounter Date: 07/10/2015      PT End of Session - 07/12/15 2208    Visit Number 1   Number of Visits 9   Date for PT Re-Evaluation 2015-08-12   Authorization Type g codes   PT Start Time 1350   PT Stop Time [id number]   PT Time Calculation (min) 55 min   Activity Tolerance Patient tolerated treatment well   Behavior During Therapy [facility] for tasks assessed/performed      Past Medical History  Diagnosis Date  . Migraine   . Cardiomyopathy     nonischemic  . GERD (gastroesophageal reflux disease)   . Erosive esophagitis     LA Classification Grade B  . Hyperlipidemia   . Osteopenia   . Hiatal hernia   . Diabetes mellitus     pt states she is not diabetic  . Sessile colonic polyp 10/2011    adenoma  . Melanoma ([place])     basil cell, pt reports no melanoma    Past Surgical History  Procedure Laterality Date  . Tubal ligation    . Vaginal hysterectomy    . Appendectomy      There were no vitals filed for this visit.       Subjective Assessment - 07/12/15 2158    Subjective Low back pain   Pertinent History [name] is a 70 year old female who presents today with a chief complaint of back pain. Her pain is located on both the left lower back as well as the center. Pain initially radiated down into the left hip and down the leg but she hasn't noticed that recently. No numbness/tingling in either LE. Her pain has been present for 2.5 weeks. It started while she was down at the beach. She denies any change in her regular activity. No long walks on the beach. She feels like she had muscle spasms initially. Currently the pain is a deep ache and pulling sensation. Her pain is  constant all day. Pt reports that the pain worsens as the day progresses. Pain wakes her up at night. Pt likes to sleep on her right side. Aggravating: bending, sitting, driving. Easing: icy/hot, no change with ice/heat, no change with ibuprofen. Pt cannot describe a position of comfort. Denies recent injury or trauma. Recent lumbar radiographs showed mild lumbar scoliosis, convex right. Diffuse multilevel degenerative change with 2 mm anterolisthesis L4 on L5. No acute or focal abnormality identified. ROS negative for red flags with the exception of night and constant pain. No recent changes in weight. No chills, fevers, or night sweats. No history of cancer.    Patient Stated Goals Return to normal level of function without pain   Currently in Pain? Yes   Pain Score 5   Worst 6/10, Best: 5/10   Pain Location Back   Pain Orientation Left;Lower  central   Pain Descriptors / Indicators Aching  Pulling   Pain Type Other (Comment)  Subacute   Pain Radiating Towards L hip intermittently but improving   Pain Onset 1 to 4 weeks ago   Pain Frequency Constant   Aggravating Factors  See history   Multiple Pain Sites No  [facility] PT Assessment - 07/12/15 2200    Assessment   Medical Diagnosis Left low back pain, with sciatica presence unspecified   Referring Provider [name]   Onset Date/Surgical Date 06/23/15   Next MD Visit Not reported   Prior Therapy No   Precautions   Precautions None   Restrictions   Weight Bearing Restrictions No   Balance Screen   Has the patient fallen in the past 6 months No   Has the patient had a decrease in activity level because of a fear of falling?  Yes   Is the patient reluctant to leave their home because of a fear of falling?  No   Home Environment   Living Environment Private residence   [place] to enter   Entrance Stairs-Number of Steps 3   Entrance Stairs-Rails None   Home Layout Two level    Alternate Level Stairs-Number of Steps 16   Alternate Level Stairs-Rails Left   Home Equipment Grab bars - tub/shower   Prior Function   Level of Independence Independent   Vocation Works at [employer], reading   Cognition   Overall Cognitive Status Within Functional Limits for tasks assessed   Observation/Other Assessments   Other Surveys  Other Surveys   Modified [place] 46%   Sensation   Light Touch Appears Intact   Additional Comments L2-S2 appears intact   Posture/Postural Control   Posture Comments Flat lower back with decreased natural lumbar lordosis   ROM / Strength   AROM / PROM / Strength AROM;Strength   AROM   Overall AROM Comments Mild decrease in hip extension bilaterally. Bilateral hip IR/ER is at least 30 degrees in both directions and painless. Hip flexion WNL. Lumbar AROM flexion, extension, and rotation is moderately limited. Pt reports painless forward flexion but painful return to neutral. Painless extension.    Strength   Strength Assessment Site Hip;Knee;Ankle   Right/Left Hip Right;Left   Right Hip Flexion 4/5   Right Hip Extension 4-/5   Right Hip External Rotation  4/5   Right Hip Internal Rotation 4/5   Right Hip ABduction 4-/5   Right Hip ADduction 3+/5   Left Hip Flexion 4/5   Left Hip Extension 4-/5   Left Hip External Rotation 4/5   Left Hip Internal Rotation 4/5   Left Hip ABduction 4-/5   Left Hip ADduction 3+/5   Right/Left Knee Right;Left   Right Knee Flexion 4+/5   Right Knee Extension 4+/5   Left Knee Flexion 4+/5   Left Knee Extension 4+/5   Right/Left Ankle Right;Left   Right Ankle Dorsiflexion 4+/5   Left Ankle Dorsiflexion 4+/5   Palpation   Palpation comment Pt is painful to palpation along L lumbar paraspinals especially between L3-L5. Painless on R side. Painful and spasms with CPA assessment to L3-L5. Appears to have normal mobility. Positive reproduction of pain with L hip scour. Negative hip scour on right.  Negative FABER and FADIR bilaterally. Negative sacral thrust, SI compression, and SI distraction.    Ambulation/Gait   Gait Comments Full gait assessment deferred        OBJECTIVE  Ther-ex Sidelying hip abduction x 10 bilateral; Supine knee to chest stretch 30 second hold x 2 bilateral; Pt educated about HEP and provided written handout. Ensured proper form and technique.                    PT Education -  07/12/15 06-12-05    Education provided Yes   Education Details HEP, plan of care, prognosis   Person(s) Educated Patient   Methods Explanation;Demonstration;Tactile cues;Verbal cues;Handout   Comprehension Verbalized understanding;Returned demonstration             PT Long Term Goals - 07/12/15 2211/06/13    PT LONG TERM GOAL #1   Title Pt will be independent with HEP in order to manage symptoms and improve function at home   Time 4   Period Weeks   Status New   PT LONG TERM GOAL #2   Title Pt will report worst pain as 4/10 on NPRS in order to demonstrates clinically significant reduction in pain   Baseline 07/10/15: worst 6/10   Time 4   Period Weeks   Status New   PT LONG TERM GOAL #3   Title Pt will demonstrate decrease in mODI by at least 13% in order to demonstrate clinically significant reduction in pain   Baseline 07/10/15: 46%   Time 4   Period Weeks   Status New               Plan - 07/12/15 06/13/07    Clinical Impression Statement Pt is a pleasant 70 yo Caucasian female referred for low back pain. Pt with mild scoliosis and anterolithesis on radiographs. PT evaluation reveals tenderness to palpation along L lumbar paraspinals from L3-L5. She also reports positive reproduction of pain and palpable spasms with central passive accesorry motion testing of L3-L5 segments. Overall loss of AROM of lumbar spine as well as loss of natural lumbar lordosis. Pt with poor hip strength in abduction, adduction, and rotation. Pt will benefit from skilled PT  services to address deficits in lumbar motion, strength, and pain in order to decrease pain and return to full function at home.   Rehab Potential Good   Clinical Impairments Affecting Rehab Potential Positive: motivation, Negative: degenerative changes, anterolithesis   PT Frequency 2x / week   PT Duration 4 weeks   PT Treatment/Interventions Aquatic Therapy;Cryotherapy;Electrical Stimulation;Iontophoresis 4mg /ml Dexamethasone;Moist Heat;Traction;Ultrasound;Gait training;Stair training;Therapeutic activities;Therapeutic exercise;Neuromuscular re-education;Patient/family education;Manual techniques;Dry needling   PT Next Visit Plan Continue with hip and core strengthening and well as soft tissue mobilizations and grade I-II mobilizations (grade III/IV contraindicated due to anterolithesis)   PT Home Exercise Plan Sidelying hip abduction, knee to chest stretch    Consulted and Agree with Plan of Care Patient      Patient will benefit from skilled therapeutic intervention in order to improve the following deficits and impairments:  Decreased range of motion, Pain, Improper body mechanics  Visit Diagnosis: Midline low back pain without sciatica - Plan: PT plan of care cert/re-cert  Abnormal posture - Plan: PT plan of care cert/re-cert      G-Codes - [id number] 06-13-2215    Functional Assessment Tool Used clinical judgement, MMT, mODI   Functional Limitation Mobility: Walking and moving around   Mobility: Walking and Moving Around Current Status [phone]) At least 40 percent but less than 60 percent impaired, limited or restricted   Mobility: Walking and Moving Around Goal Status [phone]) At least 1 percent but less than 20 percent impaired, limited or restricted       Problem List Patient Active Problem List   Diagnosis Date Noted  . Eustachian tube dysfunction 10/27/2014  . Depressed 01/05/2013  . Insomnia 07/12/2012  . Grief reaction 07/12/2012  . HIATAL HERNIA 10/08/2007  . COLONIC  POLYPS, HYPERPLASTIC, HX OF 10/08/2007  . Cardiomyopathy ([place])  06/22/2007  . EROSIVE ESOPHAGITIS 06/22/2007  . GERD 06/22/2007  . HYPERCHOLESTEROLEMIA, PURE 08/11/2006   Phillips Grout PT, DPT   Huprich,Jason 07/12/2015, 10:21 PM  [facility] [address], [place], [address] Phone: [phone]   Fax:  [phone]  Name: [name] MRN: [id number] Date of Birth: 02-Jun-1945

## 2015-07-16 ENCOUNTER — Ambulatory Visit: Payer: Commercial Managed Care - HMO | Admitting: Physical Therapy

## 2015-07-16 ENCOUNTER — Encounter: Payer: Self-pay | Admitting: Physical Therapy

## 2015-07-16 DIAGNOSIS — M5442 Lumbago with sciatica, left side: Secondary | ICD-10-CM

## 2015-07-16 DIAGNOSIS — M5441 Lumbago with sciatica, right side: Secondary | ICD-10-CM | POA: Diagnosis not present

## 2015-07-16 DIAGNOSIS — R293 Abnormal posture: Secondary | ICD-10-CM | POA: Diagnosis not present

## 2015-07-16 DIAGNOSIS — M545 Low back pain: Secondary | ICD-10-CM | POA: Diagnosis not present

## 2015-07-16 NOTE — Therapy (Signed)
[facility] [address] [place], [address] Phone: [phone]   Fax:  [phone]  Physical Therapy Treatment  Patient Details  Name: [name] MRN: [id number] Date of Birth: [birth year] Referring Provider: Alma Friendly  Encounter Date: 07/16/2015      PT End of Session - 07/16/15 1836    Visit Number 2   Number of Visits 9   Date for PT Re-Evaluation 08/07/15   Authorization Type 2   Authorization Time Period 10 (g code)   PT Start Time 1030   PT Stop Time 1120   PT Time Calculation (min) 50 min   Activity Tolerance Patient tolerated treatment well   Behavior During Therapy [facility] for tasks assessed/performed      Past Medical History  Diagnosis Date  . Migraine   . Cardiomyopathy     nonischemic  . GERD (gastroesophageal reflux disease)   . Erosive esophagitis     LA Classification Grade B  . Hyperlipidemia   . Osteopenia   . Hiatal hernia   . Diabetes mellitus     pt states she is not diabetic  . Sessile colonic polyp 10/2011    adenoma  . Melanoma ([place])     basil cell, pt reports no melanoma    Past Surgical History  Procedure Laterality Date  . Tubal ligation    . Vaginal hysterectomy    . Appendectomy      There were no vitals filed for this visit.      Subjective Assessment - 07/16/15 1037    Subjective Patient states she's experiencing low back pain spanning across the entirity of the low back. Increased pain with sitting,vacuuming, and bending and decreased pain with standing. States pain radiating down the side of left leg which started after onset of low back pain. States she hasn't been doing as much activity at home to avoid the pain.    Pertinent History [name] is a 70 year old female who presents today with a chief complaint of back pain. Her pain is located on both the left lower back as well as the center. Pain initially radiated down into the left hip and  down the leg but she hasn't noticed that recently. No numbness/tingling in either LE. Her pain has been present for 2.5 weeks. It started while she was down at the beach. She denies any change in her regular activity. No long walks on the beach. She feels like she had muscle spasms initially. Currently the pain is a deep ache and pulling sensation. Her pain is constant all day. Pt reports that the pain worsens as the day progresses. Pain wakes her up at night. Pt likes to sleep on her right side. Aggravating: bending, sitting, driving. Easing: icy/hot, no change with ice/heat, no change with ibuprofen. Pt cannot describe a position of comfort. Denies recent injury or trauma. Recent lumbar radiographs showed mild lumbar scoliosis, convex right. Diffuse multilevel degenerative change with 2 mm anterolisthesis L4 on L5. No acute or focal abnormality identified. ROS negative for red flags with the exception of night and constant pain. No recent changes in weight. No chills, fevers, or night sweats. No history of cancer.    Patient Stated Goals Return to normal level of function without pain   Currently in Pain? Yes   Pain Score 5    Pain Location Back   Pain Orientation Left;Lower   Pain Descriptors / Indicators Aching   Pain  Onset 1 to 4 weeks ago     Objective: Observation: Stiff/guarded stance posture. Decreased lumbar lordosis in sitting. Palpation: Increase muscle spasms and guarding throughout the lumbar extensors and superior aspect of the left glute max.  Measurement: Lumbar AROM/MMT: Flexion: WNL, Extension: WNL, Left lateral flexion: WNL, Right lateral flexion: WNL, Right rotation: WNL, Left rotation: WNL -- ( No increase or decrease in pain at end range) Multifidus activation: Good activation (with hip extension straight leg raise)  Therapeutic Execise: Patient performed exercises with guidance, verbal and tactile cues and demonstration of therapist: Ball/glute squeezes in sitting -- x  15 Hip extension in prone -- x 5 bilaterally Standing lumbar extension -- x10 -- to decrease lumbar pain.  Laying prone on a pillow -- 10 minutes Educated on posture/positioning with sitting and lying. Reviewed proper ergonomics when using the computer and when lifting items from the ground. Educated on proper technique when transferring from lying to sitting  Modalities: Ice pack applied to lumbar spine to decrease spasms and inflammation with patient in prone for 10 mins. Skin checked before and after treatment with no adverse signs noted.   Patient response to treatment: Decreased pain after lying for 10 minutes and ice pack modalities. No change in pain after performing repeated extension in standing.         PT Education - 07/17/15 0834    Education provided Yes   Education Details HEP: positioning at home with pillow under stomach to decrease pain    Person(s) Educated Patient   Methods Explanation;Demonstration;handout   Comprehension Verbalized understanding;Returned demonstration             PT Long Term Goals - 07/12/15 2213    PT LONG TERM GOAL #1   Title Pt will be independent with HEP in order to manage symptoms and improve function at home   Time 4   Period Weeks   Status New   PT LONG TERM GOAL #2   Title Pt will report worst pain as 4/10 on NPRS in order to demonstrates clinically significant reduction in pain   Baseline 07/10/15: worst 6/10   Time 4   Period Weeks   Status New   PT LONG TERM GOAL #3   Title Pt will demonstrate decrease in mODI by at least 13% in order to demonstrate clinically significant reduction in pain   Baseline 07/10/15: 46%   Time 4   Period Weeks   Status New               Plan - 07/16/15 1850    Clinical Impression Statement Focused today's treatment on decreasing patient's pain and educating on proper positioning and posture at home to maintain decreased pain. Patient's pain decreased after prolonged positioning in  prone and modalities indicating decreased inflammation in the affected area and patienet will benefit from further skilled therapy aimed at decreasing pain to allow for the performance of core stabiilization exercises and return to prior level of funciton.    Rehab Potential Good   Clinical Impairments Affecting Rehab Potential Positive: motivation, Negative: degenerative changes, anterolithesis   PT Frequency 2x / week   PT Duration 4 weeks   PT Treatment/Interventions Aquatic Therapy;Cryotherapy;Electrical Stimulation;Iontophoresis 4mg /ml Dexamethasone;Moist Heat;Traction;Ultrasound;Gait training;Stair training;Therapeutic activities;Therapeutic exercise;Neuromuscular re-education;Patient/family education;Manual techniques;Dry needling   PT Next Visit Plan Continue with hip and core strengthening and well as soft tissue mobilizations and grade I-II mobilizations (grade III/IV contraindicated due to anterolithesis)   PT Home Exercise Plan Sidelying hip abduction, knee to  chest stretch    Consulted and Agree with Plan of Care Patient      Patient will benefit from skilled therapeutic intervention in order to improve the following deficits and impairments:  Decreased range of motion, Pain, Improper body mechanics  Visit Diagnosis: Midline low back pain with left-sided sciatica  Abnormal posture     Problem List Patient Active Problem List   Diagnosis Date Noted  . Eustachian tube dysfunction 10/27/2014  . Depressed 01/05/2013  . Insomnia 07/12/2012  . Grief reaction 07/12/2012  . HIATAL HERNIA 10/08/2007  . COLONIC POLYPS, HYPERPLASTIC, HX OF 10/08/2007  . Cardiomyopathy ([place]) 06/22/2007  . EROSIVE ESOPHAGITIS 06/22/2007  . GERD 06/22/2007  . HYPERCHOLESTEROLEMIA, PURE 08/11/2006    Blythe Stanford, SPT 07/17/2015, 8:59 AM  [facility] [address] [place], [address] Phone: [phone]   Fax:   [phone]  Name: ANALUCIA WILBURN MRN: [id number] Date of Birth: 10-Oct-1945

## 2015-07-17 ENCOUNTER — Encounter: Payer: Self-pay | Admitting: Family Medicine

## 2015-07-17 ENCOUNTER — Ambulatory Visit (INDEPENDENT_AMBULATORY_CARE_PROVIDER_SITE_OTHER): Payer: Commercial Managed Care - HMO | Admitting: Family Medicine

## 2015-07-17 VITALS — BP 114/62 | HR 66 | Temp 97.7°F | Ht 62.25 in | Wt 133.5 lb

## 2015-07-17 DIAGNOSIS — E7800 Pure hypercholesterolemia, unspecified: Secondary | ICD-10-CM | POA: Diagnosis not present

## 2015-07-17 DIAGNOSIS — G4700 Insomnia, unspecified: Secondary | ICD-10-CM

## 2015-07-17 DIAGNOSIS — Z23 Encounter for immunization: Secondary | ICD-10-CM

## 2015-07-17 DIAGNOSIS — Z0000 Encounter for general adult medical examination without abnormal findings: Secondary | ICD-10-CM

## 2015-07-17 DIAGNOSIS — M549 Dorsalgia, unspecified: Secondary | ICD-10-CM

## 2015-07-17 DIAGNOSIS — Z780 Asymptomatic menopausal state: Secondary | ICD-10-CM

## 2015-07-17 DIAGNOSIS — I429 Cardiomyopathy, unspecified: Secondary | ICD-10-CM

## 2015-07-17 DIAGNOSIS — M545 Low back pain: Secondary | ICD-10-CM

## 2015-07-17 DIAGNOSIS — K208 Other esophagitis without bleeding: Secondary | ICD-10-CM

## 2015-07-17 DIAGNOSIS — G8929 Other chronic pain: Secondary | ICD-10-CM | POA: Insufficient documentation

## 2015-07-17 LAB — CBC WITH DIFFERENTIAL/PLATELET
BASOS ABS: 0 10*3/uL (ref 0.0–0.1)
Basophils Relative: 0.5 % (ref 0.0–3.0)
EOS ABS: 0.2 10*3/uL (ref 0.0–0.7)
Eosinophils Relative: 4.4 % (ref 0.0–5.0)
HCT: 37.7 % (ref 36.0–46.0)
Hemoglobin: 12.7 g/dL (ref 12.0–15.0)
LYMPHS ABS: 1.4 10*3/uL (ref 0.7–4.0)
Lymphocytes Relative: 25.8 % (ref 12.0–46.0)
MCHC: 33.6 g/dL (ref 30.0–36.0)
MCV: 87.2 fl (ref 78.0–100.0)
MONO ABS: 0.6 10*3/uL (ref 0.1–1.0)
Monocytes Relative: 10.3 % (ref 3.0–12.0)
NEUTROS ABS: 3.2 10*3/uL (ref 1.4–7.7)
Neutrophils Relative %: 59 % (ref 43.0–77.0)
PLATELETS: 260 10*3/uL (ref 150.0–400.0)
RBC: 4.32 Mil/uL (ref 3.87–5.11)
RDW: 13.3 % (ref 11.5–15.5)
WBC: 5.4 10*3/uL (ref 4.0–10.5)

## 2015-07-17 LAB — TSH: TSH: 2.53 u[IU]/mL (ref 0.35–4.50)

## 2015-07-17 LAB — VITAMIN D 25 HYDROXY (VIT D DEFICIENCY, FRACTURES): VITD: 33.02 ng/mL (ref 30.00–100.00)

## 2015-07-17 NOTE — Progress Notes (Signed)
Pre visit review using our clinic review tool, if applicable. No additional management support is needed unless otherwise documented below in the visit note. 

## 2015-07-17 NOTE — Progress Notes (Signed)
Subjective:   Patient ID: [name], female    DOB: 07-25-1945, 70 y.o.   MRN: [id number]  [name] is a pleasant 70 y.o. year old female who presents to clinic today with Annual Exam and Back Pain  and follow up of chronic medical conditions on 07/17/2015  HPI: I have personally reviewed the Medicare Annual Wellness questionnaire and have noted 1. The patient's medical and social history 2. Their use of alcohol, tobacco or illicit drugs 3. Their current medications and supplements 4. The patient's functional ability including ADL's, fall risks, home safety risks and hearing or visual             impairment. 5. Diet and physical activities 6. Evidence for depression or mood disorders  End of life wishes discussed and updated in Social History.  The roster of all physicians providing medical care to patient - is listed in the CareTeams section of the chart.  Colonoscopy 10/10/11 Zostavax 12/17/10 DEXA UTD Mammogram 08/08/14   Back pain- saw Allie Bossier on 07/04/15 for left sided low back pain.  Note reviewed. eRx sent for tizandine, lumbar xray done which showed Diffuse multilevel degenerative change with 2 mm anterolisthesis L4 on L5.   Referred for PT which she has attended twice- 5/2 and 07/16/15.  Has another appointment with PT on Thursday.   GERD- recently saw Dr. Fuller Plan. Note reviewed from 07/03/15.  Given history of erosive esophagitis, he did advise to continue Protonix 40 mg twice daily and Pepcid AC twice daily as needed for breakthrough symptoms.  Cardiomyopathy- saw Dr. Harrington Challenger on 03/22/15.  Note reviewed.  No changes made.  Lab Results  Component Value Date   CHOL 169 03/22/2015   HDL 48 03/22/2015   LDLCALC 68 03/22/2015   LDLDIRECT 89.3 04/03/2011   TRIG 265* 03/22/2015   CHOLHDL 3.5 03/22/2015   Lab Results  Component Value Date   CREATININE 0.56* 03/22/2015   Lab Results  Component Value Date   TSH 2.09 03/27/2011    Insomnia and grief- She feels  lexapro 20 mg daily is working well and feels she needs to continue this indefinitely.  She did try trazodone shortly after her husband passed away. It was not effective and made her feel "drugged" the following day, so we started prn ambien. Spends a lot of time back and forth from [place] spending time with her kids and grandkids which she loves.   Current Outpatient Prescriptions on File Prior to Visit  Medication Sig Dispense Refill  . amLODipine (NORVASC) 2.5 MG tablet Take 1 tablet (2.5 mg total) by mouth daily. 90 tablet 3  . carvedilol (COREG) 12.5 MG tablet Take 1 tablet (12.5 mg total) by mouth 2 (two) times daily with a meal. 180 tablet 3  . enalapril (VASOTEC) 10 MG tablet Take 1 tablet (10 mg total) by mouth daily. 90 tablet 3  . escitalopram (LEXAPRO) 20 MG tablet Take 1 tablet (20 mg total) by mouth daily. 30 tablet 11  . Multiple Vitamin (MULTIVITAMIN) capsule Take 1 capsule by mouth daily.      . pantoprazole (PROTONIX) 40 MG tablet Take 1 tablet (40 mg total) by mouth 2 (two) times daily. 60 tablet 11  . promethazine (PHENERGAN) 12.5 MG tablet TAKE ONE TABLET EVERY EIGHT HOURS AS NEEDED FOR NAUSEA / VOMITING 20 tablet 0  . simvastatin (ZOCOR) 20 MG tablet Take 1 tablet (20 mg total) by mouth at bedtime. 90 tablet 3  . tiZANidine (ZANAFLEX) 2 MG  tablet Take 1 tablet (2 mg total) by mouth every 8 (eight) hours as needed for muscle spasms. 30 tablet 0  . zolpidem (AMBIEN) 10 MG tablet TAKE ONE TABLET AT BEDTIME 30 tablet 0   No current facility-administered medications on file prior to visit.    Allergies  Allergen Reactions  . Penicillins Hives  . Lansoprazole     REACTION: headaches  . Sulfa Antibiotics Hives  . Ceclor [Cefaclor] Itching and Rash  . Ceftin [Cefuroxime Axetil] Rash  . Clindamycin/Lincomycin Itching and Rash  . Latex Itching and Rash  . Lincomycin Hcl Itching and Rash  . Septra [Sulfamethoxazole-Trimethoprim] Rash    Past Medical History    Diagnosis Date  . Migraine   . Cardiomyopathy     nonischemic  . GERD (gastroesophageal reflux disease)   . Erosive esophagitis     LA Classification Grade B  . Hyperlipidemia   . Osteopenia   . Hiatal hernia   . Diabetes mellitus     pt states she is not diabetic  . Sessile colonic polyp 10/2011    adenoma  . Melanoma ([place])     basil cell, pt reports no melanoma    Past Surgical History  Procedure Laterality Date  . Tubal ligation    . Vaginal hysterectomy    . Appendectomy      Family History  Problem Relation Age of Onset  . Heart disease Father   . Alzheimer's disease Mother   . Colon cancer Neg Hx   . Stomach cancer Neg Hx   . Rectal cancer Neg Hx   . Esophageal cancer Neg Hx   . Arthritis/Rheumatoid Sister     Social History   Social History  . Marital Status: Married    Spouse Name: N/A  . Number of Children: 3  . Years of Education: N/A   Occupational History  . Housewife    Social History Main Topics  . Smoking status: Former Smoker    Quit date: 06/03/1981  . Smokeless tobacco: Never Used  . Alcohol Use: 1.8 oz/week    3 Glasses of wine per week     Comment: Wine on occasional  . Drug Use: No  . Sexual Activity: Not on file   Other Topics Concern  . Not on file   Social History Narrative   Lives alone in two-story home.     Her husband passed away unexpectedly.  They have three children- one is a pediatrician.   Desires CPR.   The PMH, PSH, Social History, Family History, Medications, and allergies have been reviewed in [facility], and have been updated if relevant.   Review of Systems     Objective:    BP 114/62 mmHg  Pulse 66  Temp(Src) 97.7 F (36.5 C) (Oral)  Ht 5' 2.25" (1.581 m)  Wt 133 lb 8 oz (60.555 kg)  BMI 24.23 kg/m2  SpO2 97%   Physical Exam   General:  Well-developed,well-nourished,in no acute distress; alert,appropriate and cooperative throughout examination Head:  normocephalic and atraumatic.   Eyes:  vision  grossly intact, pupils equal, pupils round, and pupils reactive to light.   Ears:  R ear normal and L ear normal.   Nose:  no external deformity.   Mouth:  good dentition.   Neck:  No deformities, masses, or tenderness noted. Lungs:  Normal respiratory effort, chest expands symmetrically. Lungs are clear to auscultation, no crackles or wheezes. Heart:  Normal rate and regular rhythm. S1 and S2 normal without  gallop, murmur, click, rub or other extra sounds. Abdomen:  Bowel sounds positive,abdomen soft and non-tender without masses, organomegaly or hernias noted. Msk:  No deformity or scoliosis noted of thoracic or lumbar spine.   Extremities:  No clubbing, cyanosis, edema, or deformity noted with normal full range of motion of all joints.   Neurologic:  alert & oriented X3 and gait normal.   Skin:  Intact without suspicious lesions or rashes Cervical Nodes:  No lymphadenopathy noted Axillary Nodes:  No palpable lymphadenopathy Psych:  Cognition and judgment appear intact. Alert and cooperative with normal attention span and concentration. No apparent delusions, illusions, hallucinations       Assessment & Plan:   Medicare annual wellness visit, subsequent No Follow-up on file.

## 2015-07-17 NOTE — Addendum Note (Signed)
Addended by: Modena Nunnery on: 07/17/2015 10:02 AM   Modules accepted: Orders

## 2015-07-17 NOTE — Patient Instructions (Signed)
Great to see you.  We will call you with your lab results. 

## 2015-07-17 NOTE — Assessment & Plan Note (Signed)
The patients weight, height, BMI and visual acuity have been recorded in the chart I have made referrals, counseling and provided education to the patient based review of the above and I have provided the pt with a written personalized care plan for preventive services.  

## 2015-07-17 NOTE — Assessment & Plan Note (Signed)
Followed by cardiology. Cholesterol at goal.

## 2015-07-17 NOTE — Assessment & Plan Note (Signed)
Continue current rxs. No changes made. 

## 2015-07-17 NOTE — Assessment & Plan Note (Signed)
Followed by GI. Continue PPI with as needed H2 blocker.

## 2015-07-18 ENCOUNTER — Encounter: Payer: Self-pay | Admitting: *Deleted

## 2015-07-18 ENCOUNTER — Ambulatory Visit: Payer: Commercial Managed Care - HMO | Admitting: Physical Therapy

## 2015-07-18 ENCOUNTER — Telehealth: Payer: Self-pay

## 2015-07-18 NOTE — Telephone Encounter (Signed)
Alexander with [facility] left v/m requesting order for bone density faxed to [phone]; done.

## 2015-07-24 ENCOUNTER — Ambulatory Visit: Payer: Commercial Managed Care - HMO | Admitting: Physical Therapy

## 2015-07-24 ENCOUNTER — Encounter: Payer: Self-pay | Admitting: Physical Therapy

## 2015-07-24 DIAGNOSIS — R293 Abnormal posture: Secondary | ICD-10-CM

## 2015-07-24 DIAGNOSIS — M545 Low back pain, unspecified: Secondary | ICD-10-CM

## 2015-07-24 DIAGNOSIS — M5442 Lumbago with sciatica, left side: Secondary | ICD-10-CM | POA: Diagnosis not present

## 2015-07-24 DIAGNOSIS — M5441 Lumbago with sciatica, right side: Secondary | ICD-10-CM | POA: Diagnosis not present

## 2015-07-24 NOTE — Therapy (Signed)
[facility] [address] [place], [address] Phone: [phone]   Fax:  [phone]  Physical Therapy Treatment  Patient Details  Name: [name] MRN: [id number] Date of Birth: 05/29/1945 Referring Provider: Alma Friendly  Encounter Date: 07/24/2015      PT End of Session - 07/24/15 1157    Visit Number 3   Number of Visits 9   Date for PT Re-Evaluation 08/07/15   Authorization Type 3   Authorization Time Period 10 (g code)   PT Start Time 1038   PT Stop Time 1127   PT Time Calculation (min) 49 min   Activity Tolerance Patient tolerated treatment well   Behavior During Therapy [facility] for tasks assessed/performed      Past Medical History  Diagnosis Date  . Migraine   . Cardiomyopathy     nonischemic  . GERD (gastroesophageal reflux disease)   . Erosive esophagitis     LA Classification Grade B  . Hyperlipidemia   . Osteopenia   . Hiatal hernia   . Diabetes mellitus     pt states she is not diabetic  . Sessile colonic polyp 10/2011    adenoma  . Melanoma ([place])     basil cell, pt reports no melanoma    Past Surgical History  Procedure Laterality Date  . Tubal ligation    . Vaginal hysterectomy    . Appendectomy      There were no vitals filed for this visit.      Subjective Assessment - 07/24/15 1037    Subjective Patient states she's still feeling constant low back pain. Reports she's been following precautions when performing cleaning activities but states increased pain after performing most activities at home.    Pertinent History [name] is a 70 year old female who presents today with a chief complaint of back pain. Her pain is located on both the left lower back as well as the center. Pain initially radiated down into the left hip and down the leg but she hasn't noticed that recently. No numbness/tingling in either LE. Her pain has been present for 2.5 weeks. It started  while she was down at the beach. She denies any change in her regular activity. No long walks on the beach. She feels like she had muscle spasms initially. Currently the pain is a deep ache and pulling sensation. Her pain is constant all day. Pt reports that the pain worsens as the day progresses. Pain wakes her up at night. Pt likes to sleep on her right side. Aggravating: bending, sitting, driving. Easing: icy/hot, no change with ice/heat, no change with ibuprofen. Pt cannot describe a position of comfort. Denies recent injury or trauma. Recent lumbar radiographs showed mild lumbar scoliosis, convex right. Diffuse multilevel degenerative change with 2 mm anterolisthesis L4 on L5. No acute or focal abnormality identified. ROS negative for red flags with the exception of night and constant pain. No recent changes in weight. No chills, fevers, or night sweats. No history of cancer.    Patient Stated Goals Return to normal level of function without pain   Currently in Pain? Yes   Pain Score 6    Pain Location Back   Pain Orientation Right;Left   Pain Descriptors / Indicators Aching   Pain Type Other (Comment)   Pain Onset 1 to 4 weeks ago   Pain Frequency Constant      Objective: Observation: Stiff/guarded stance posture. Decreased lumbar  lordosis in sitting. Palpation: Increase muscle spasms and guarding throughout the lumbar extensors and superior aspect of the right glute max.   Therapeutic Execise: Patient performed exercises with guidance, verbal and tactile cues and demonstration of therapist: Hip extension in prone -- x 10 bilaterally Laying prone on a pillow -- 15 minutes Prone on elbows -- 2x 2 min  Clamshells in sidelying -- x 10  Modalities: High volt per clinical protocol for 15 min with 4 large electrodes placed bilaterally along lower lumbar segments with ice pack applied to lumbar spine to decrease spasms and inflammation with patient in prone lying. Skin checked before and  after treatment with no adverse reactions noted.   Patient response to treatment:  Decrease in pain from 6/10 to ~4/10 after performing modalities treatment indicating decreased inflammation and muscle spasms. Aggravation of symptoms after performing active hip extension exercise which is slightly decreased with STM to the left glute.          PT Education - 07/24/15 1159    Education provided Yes   Education Details HEP: clamshells, ice and positioning for pain control   Person(s) Educated Patient   Methods Explanation;Demonstration   Comprehension Verbalized understanding;Returned demonstration             PT Long Term Goals - 07/12/15 2213    PT LONG TERM GOAL #1   Title Pt will be independent with HEP in order to manage symptoms and improve function at home   Time 4   Period Weeks   Status New   PT LONG TERM GOAL #2   Title Pt will report worst pain as 4/10 on NPRS in order to demonstrates clinically significant reduction in pain   Baseline 07/10/15: worst 6/10   Time 4   Period Weeks   Status New   PT LONG TERM GOAL #3   Title Pt will demonstrate decrease in mODI by at least 13% in order to demonstrate clinically significant reduction in pain   Baseline 07/10/15: 46%   Time 4   Period Weeks   Status New               Plan - 07/24/15 1442    Clinical Impression Statement Focused today's treatment on relieving increased back pain as it was aggravated all weekend and patient demonstrates decreased pain after performing modalities and manual therapy indicating improved muscle spasms and tissue elasticity. Hip extension in prone aggravated low back pain indicating increased inflammation and patient will benefit from further skilled therapy focusing on performing core stabilization exercise to return to prior level of function.    Rehab Potential Good   Clinical Impairments Affecting Rehab Potential Positive: motivation, Negative: degenerative changes,  anterolithesis   PT Frequency 2x / week   PT Duration 4 weeks   PT Treatment/Interventions Aquatic Therapy;Cryotherapy;Electrical Stimulation;Iontophoresis 4mg /ml Dexamethasone;Moist Heat;Traction;Ultrasound;Gait training;Stair training;Therapeutic activities;Therapeutic exercise;Neuromuscular re-education;Patient/family education;Manual techniques;Dry needling   PT Next Visit Plan Continue with hip and core strengthening and well as soft tissue mobilizations    PT Home Exercise Plan Sidelying hip abduction, knee to chest stretch    Consulted and Agree with Plan of Care Patient      Patient will benefit from skilled therapeutic intervention in order to improve the following deficits and impairments:  Decreased range of motion, Pain, Improper body mechanics  Visit Diagnosis: Midline low back pain with left-sided sciatica  Abnormal posture  Midline low back pain without sciatica     Problem List Patient Active Problem List  Diagnosis Date Noted  . Medicare annual wellness visit, subsequent 07/17/2015  . Back pain 07/17/2015  . Eustachian tube dysfunction 10/27/2014  . Depressed 01/05/2013  . Insomnia 07/12/2012  . Grief reaction 07/12/2012  . HIATAL HERNIA 10/08/2007  . COLONIC POLYPS, HYPERPLASTIC, HX OF 10/08/2007  . Cardiomyopathy ([place]) 06/22/2007  . EROSIVE ESOPHAGITIS 06/22/2007  . GERD 06/22/2007  . HYPERCHOLESTEROLEMIA, PURE 08/11/2006    Blythe Stanford, SPT 07/24/2015, 2:48 PM  [facility] [address] [place], [address] Phone: [phone]   Fax:  [phone]  Name: KAELAN BRANDLI MRN: [id number] Date of Birth: May 16, 1945

## 2015-07-26 ENCOUNTER — Other Ambulatory Visit: Payer: Self-pay | Admitting: Family Medicine

## 2015-07-26 ENCOUNTER — Ambulatory Visit: Payer: Commercial Managed Care - HMO | Admitting: Physical Therapy

## 2015-07-26 DIAGNOSIS — L821 Other seborrheic keratosis: Secondary | ICD-10-CM | POA: Diagnosis not present

## 2015-07-26 DIAGNOSIS — L570 Actinic keratosis: Secondary | ICD-10-CM | POA: Diagnosis not present

## 2015-07-26 NOTE — Telephone Encounter (Signed)
Rx called in to requested pharmacy 

## 2015-07-26 NOTE — Telephone Encounter (Signed)
Last f/u 07/2015-CPE 

## 2015-07-31 ENCOUNTER — Ambulatory Visit: Payer: Commercial Managed Care - HMO | Admitting: Physical Therapy

## 2015-07-31 ENCOUNTER — Encounter: Payer: Self-pay | Admitting: Physical Therapy

## 2015-07-31 DIAGNOSIS — M5442 Lumbago with sciatica, left side: Secondary | ICD-10-CM | POA: Diagnosis not present

## 2015-07-31 DIAGNOSIS — M5441 Lumbago with sciatica, right side: Secondary | ICD-10-CM

## 2015-07-31 DIAGNOSIS — R293 Abnormal posture: Secondary | ICD-10-CM | POA: Diagnosis not present

## 2015-07-31 DIAGNOSIS — M545 Low back pain: Secondary | ICD-10-CM | POA: Diagnosis not present

## 2015-07-31 NOTE — Therapy (Signed)
[facility] [address] [place], [address] Phone: [phone]   Fax:  [phone]  Physical Therapy Treatment  Patient Details  Name: [name] MRN: [id number] Date of Birth: 02-25-1946 Referring Provider: Alma Friendly  Encounter Date: 07/31/2015      PT End of Session - 07/31/15 0938    Visit Number 4   Number of Visits 9   Date for PT Re-Evaluation 08/07/15   Authorization Type 4   Authorization Time Period 10 (g code)   PT Start Time 0935   PT Stop Time 1020   PT Time Calculation (min) 45 min   Activity Tolerance Patient tolerated treatment well   Behavior During Therapy [facility] for tasks assessed/performed      Past Medical History  Diagnosis Date  . Migraine   . Cardiomyopathy     nonischemic  . GERD (gastroesophageal reflux disease)   . Erosive esophagitis     LA Classification Grade B  . Hyperlipidemia   . Osteopenia   . Hiatal hernia   . Diabetes mellitus     pt states she is not diabetic  . Sessile colonic polyp 10/2011    adenoma  . Melanoma ([place])     basil cell, pt reports no melanoma    Past Surgical History  Procedure Laterality Date  . Tubal ligation    . Vaginal hysterectomy    . Appendectomy      There were no vitals filed for this visit.      Subjective Assessment - 07/31/15 0936    Subjective Patient states she's still feeling constant low back pain. Reports she's been following precautions when performing cleaning activities and continues with pain with prolonged sitting. She is also having increased pain with rolling over in bed and on waking in the morning.    Patient Stated Goals Return to normal level of function without pain   Currently in Pain? Yes   Pain Score 5    Pain Location Back   Pain Orientation Right   Pain Descriptors / Indicators Aching;Dull   Pain Onset 1 to 4 weeks ago   Pain Frequency Constant      Objective: Observation:  Stiff/guarded stance posture. Decreased lumbar lordosis in sitting. AROM: lumbar spine: decreased forward flexion minimally with pulling into lower back/bilateral gluteal region, extension 50% decreased, with repeated extension demonstrated decreased spasms in back Palpation: Increased muscle spasms and guarding throughout the lumbar extensors on right lower thoracic spine to lumbar spine  Therapeutic Execise: Patient performed exercises with guidance, verbal and tactile cues and demonstration of therapist: Laying prone on a pillow -- 15 minutes Prone on elbows --  2 min  Prone press  Ups through partial ROM: no reproduction of symptoms Roll to side to sit: reproduced symptoms into lower back right buttock especially on standing and beginning to walk Discussed posture and use of lumbar roll for sitting, lying and the need to lay on side or prone throughout the day to decrease symptoms  Manual therapy: STM performed along right side thoracic to lumbar spine and into upper gluteal region superficial techniques to decreased pain/spasms prior to modalities/exercise  Modalities: High volt per clinical protocol for 15 min with 4 large electrodes placed bilaterally along lower lumbar segments with ice pack applied to lumbar spine to decrease spasms and inflammation with patient in prone lying. Skin checked before and after treatment with no adverse reactions noted.   Patient response to treatment:  Decrease in pain from 5/10 to ~2/10 after performing modalities treatment indicating decreased inflammation and muscle spasms. Pain increased on standing and moving demonstrating continued inflammation with standing weight bearing activities.  demonstrated good understanding of home program         PT Education - 07/31/15 1014    Education provided Yes   Education Details HEP; continue with prone progression, posture awareness and pain contorl; watch prolonged sitting   Person(s) Educated Patient    Methods Explanation;Demonstration;Verbal cues   Comprehension Verbalized understanding;Returned demonstration;Verbal cues required             PT Long Term Goals - 07/12/15 2213    PT LONG TERM GOAL #1   Title Pt will be independent with HEP in order to manage symptoms and improve function at home   Time 4   Period Weeks   Status New   PT LONG TERM GOAL #2   Title Pt will report worst pain as 4/10 on NPRS in order to demonstrates clinically significant reduction in pain   Baseline 07/10/15: worst 6/10   Time 4   Period Weeks   Status New   PT LONG TERM GOAL #3   Title Pt will demonstrate decrease in mODI by at least 13% in order to demonstrate clinically significant reduction in pain   Baseline 07/10/15: 46%   Time 4   Period Weeks   Status New               Plan - 07/31/15 1716    Clinical Impression Statement Patient demonstrated improved understanding of prone progression exercises and the need to avoid prolonged bending, sitting activties to improve back and LE symptoms. She should continue to progress with additional physical therapy intervetnion.    Rehab Potential Good   PT Frequency 2x / week   PT Duration 4 weeks   PT Treatment/Interventions Aquatic Therapy;Cryotherapy;Electrical Stimulation;Iontophoresis 4mg /ml Dexamethasone;Moist Heat;Traction;Ultrasound;Gait training;Stair training;Therapeutic activities;Therapeutic exercise;Neuromuscular re-education;Patient/family education;Manual techniques;Dry needling   PT Next Visit Plan modalities to decreased pain, spasms, progress exercise as indicated   PT Home Exercise Plan prone, on elbows, back extension in standing, use of ice      Patient will benefit from skilled therapeutic intervention in order to improve the following deficits and impairments:  Decreased range of motion, Pain, Improper body mechanics  Visit Diagnosis: Bilateral low back pain with sciatica, sciatica laterality unspecified     Problem  List Patient Active Problem List   Diagnosis Date Noted  . Medicare annual wellness visit, subsequent 07/17/2015  . Back pain 07/17/2015  . Eustachian tube dysfunction 10/27/2014  . Depressed 01/05/2013  . Insomnia 07/12/2012  . Grief reaction 07/12/2012  . HIATAL HERNIA 10/08/2007  . COLONIC POLYPS, HYPERPLASTIC, HX OF 10/08/2007  . Cardiomyopathy ([place]) 06/22/2007  . EROSIVE ESOPHAGITIS 06/22/2007  . GERD 06/22/2007  . HYPERCHOLESTEROLEMIA, PURE 08/11/2006    Jomarie Longs PT 07/31/2015, 5:34 PM  [facility] [address] [place], [address] Phone: [phone]   Fax:  [phone]  Name: RONICE ONISHI MRN: [id number] Date of Birth: Apr 26, 1945

## 2015-08-02 ENCOUNTER — Encounter: Payer: Self-pay | Admitting: Physical Therapy

## 2015-08-02 ENCOUNTER — Ambulatory Visit: Payer: Commercial Managed Care - HMO | Admitting: Physical Therapy

## 2015-08-02 DIAGNOSIS — M5442 Lumbago with sciatica, left side: Secondary | ICD-10-CM

## 2015-08-02 DIAGNOSIS — M545 Low back pain: Secondary | ICD-10-CM | POA: Diagnosis not present

## 2015-08-02 DIAGNOSIS — R293 Abnormal posture: Secondary | ICD-10-CM | POA: Diagnosis not present

## 2015-08-02 DIAGNOSIS — M5441 Lumbago with sciatica, right side: Secondary | ICD-10-CM

## 2015-08-02 NOTE — Therapy (Signed)
[facility] [address], [place], [address] Phone: [phone]   Fax:  [phone]  Physical Therapy Treatment  Patient Details  Name: [name] MRN: [id number] Date of Birth: 09-Jan-1946 Referring Provider: Alma Friendly  Encounter Date: 08/02/2015      PT End of Session - 08/02/15 1614    Visit Number 5   Number of Visits 9   Date for PT Re-Evaluation 08/07/15   Authorization Type 5   Authorization Time Period 10 (g code)   PT Start Time 0945   PT Stop Time 1029   PT Time Calculation (min) 44 min   Activity Tolerance Patient tolerated treatment well   Behavior During Therapy [facility] for tasks assessed/performed      Past Medical History  Diagnosis Date  . Migraine   . Cardiomyopathy     nonischemic  . GERD (gastroesophageal reflux disease)   . Erosive esophagitis     LA Classification Grade B  . Hyperlipidemia   . Osteopenia   . Hiatal hernia   . Diabetes mellitus     pt states she is not diabetic  . Sessile colonic polyp 10/2011    adenoma  . Melanoma ([place])     basil cell, pt reports no melanoma    Past Surgical History  Procedure Laterality Date  . Tubal ligation    . Vaginal hysterectomy    . Appendectomy      There were no vitals filed for this visit.      Subjective Assessment - 08/02/15 0952    Subjective Patient states her back is starting to feel better and mentions she is avoiding flexion positions and observing her precautions. Reports she is no longer performing forward bending activites.    Patient Stated Goals Return to normal level of function without pain   Currently in Pain? Yes   Pain Score 4    Pain Location Back   Pain Orientation Right   Pain Descriptors / Indicators Aching;Dull   Pain Onset 1 to 4 weeks ago      Objective: Observation: Stiff/guarded stance posture. Decreased lumbar lordosis in sitting. AROM: With repeated extension demonstrated  decreased spasms in back Palpation: Increased muscle spasms and guarding throughout the lumbar and thoracic extensors on right lower thoracic spine to lumbar spine  Therapeutic Execise: Patient performed exercises with guidance, verbal and tactile cues and demonstration of therapist: Laying prone on a pillow -- 15 minutes Prone on elbows -- 1 min  Lumbar extension in standing -- x10 pre and post lying prone Discussed maintaining precautions at home.  Demonstrated golfer's bend for picking objects up off floor  Modalities: Electrical stimulation:  High volt per clinical protocol for muscle spasmms for 15 min with 4 large electrodes placed bilaterally along lower lumbar segments with ice pack applied to lumbar spine to decrease spasms and inflammation with patient in prone lying. Skin checked before and after treatment with no adverse reactions noted.   Patient response to treatment:  Decrease in pain from 4/10 to ~3/10 after performing modalities treatment indicating decreased muscle guarding and spasms. Decrease in pain after performing standing lumbar extension indicating decreased inflammation in the lumbar region.            PT Education - 08/02/15 1615    Education provided Yes   Education Details HEP: continue with prone lying and positioning. To continue posture awareness at home.   Person(s) Educated Patient   Methods Explanation;Demonstration  Comprehension Verbalized understanding;Returned demonstration             PT Long Term Goals - 07/12/15 2213    PT LONG TERM GOAL #1   Title Pt will be independent with HEP in order to manage symptoms and improve function at home   Time 4   Period Weeks   Status New   PT LONG TERM GOAL #2   Title Pt will report worst pain as 4/10 on NPRS in order to demonstrates clinically significant reduction in pain   Baseline 07/10/15: worst 6/10   Time 4   Period Weeks   Status New   PT LONG TERM GOAL #3   Title Pt will  demonstrate decrease in mODI by at least 13% in order to demonstrate clinically significant reduction in pain   Baseline 07/10/15: 46%   Time 4   Period Weeks   Status New               Plan - 08/02/15 1528    Clinical Impression Statement Patient demonstrated improved symptoms after performing prolonged prone lying, repeated extension, and adherance to lumbar precautions. Patient continues to benefit from positioning training and decreasing inflammation techniques to allow for performance of lumbar stabilization exercises.    Rehab Potential Good   PT Frequency 2x / week   PT Duration 4 weeks   PT Treatment/Interventions Aquatic Therapy;Cryotherapy;Electrical Stimulation;Iontophoresis 4mg /ml Dexamethasone;Moist Heat;Traction;Ultrasound;Gait training;Stair training;Therapeutic activities;Therapeutic exercise;Neuromuscular re-education;Patient/family education;Manual techniques;Dry needling   PT Next Visit Plan modalities to decreased pain, spasms, progress exercise as indicated   PT Home Exercise Plan prone, on elbows, back extension in standing, use of ice      Patient will benefit from skilled therapeutic intervention in order to improve the following deficits and impairments:  Decreased range of motion, Pain, Improper body mechanics  Visit Diagnosis: Bilateral low back pain with sciatica, sciatica laterality unspecified  Midline low back pain with left-sided sciatica  Abnormal posture     Problem List Patient Active Problem List   Diagnosis Date Noted  . Medicare annual wellness visit, subsequent 07/17/2015  . Back pain 07/17/2015  . Eustachian tube dysfunction 10/27/2014  . Depressed 01/05/2013  . Insomnia 07/12/2012  . Grief reaction 07/12/2012  . HIATAL HERNIA 10/08/2007  . COLONIC POLYPS, HYPERPLASTIC, HX OF 10/08/2007  . Cardiomyopathy ([place]) 06/22/2007  . EROSIVE ESOPHAGITIS 06/22/2007  . GERD 06/22/2007  . HYPERCHOLESTEROLEMIA, PURE 08/11/2006    Blythe Stanford, SPT 08/02/2015, 4:18 PM  [facility] [address] [place], [address] Phone: [phone]   Fax:  [phone]  Name: [name] MRN: [id number] Date of Birth: 1945-05-06

## 2015-08-07 ENCOUNTER — Telehealth: Payer: Self-pay

## 2015-08-07 DIAGNOSIS — M5410 Radiculopathy, site unspecified: Secondary | ICD-10-CM

## 2015-08-07 NOTE — Telephone Encounter (Signed)
Pt left v/m; pt last seen annual exam and back pain on 07/17/15; pt has been going to PT and can see no improvement; back pain continues to hurt; last 4 days has numbness in legs; pt is not resting at night due to pain. PT recommended pt get MRI. Pt request cb.

## 2015-08-07 NOTE — Telephone Encounter (Signed)
I would recommend that she see Dr. Lorelei Pont before we proceed with further imaging.  He can order an MRI if he feels it is warranted.

## 2015-08-07 NOTE — Telephone Encounter (Signed)
I can certainly order the MRI but I would like for her to follow up with Dr. Lorelei Pont as well.  Referral placed for MRI.

## 2015-08-07 NOTE — Telephone Encounter (Signed)
Spoke to pt who states she has been taking PT for 4wks and it was suggested by the therapist, that a MRI be completed

## 2015-08-08 ENCOUNTER — Ambulatory Visit: Payer: Commercial Managed Care - HMO | Admitting: Physical Therapy

## 2015-08-08 NOTE — Telephone Encounter (Signed)
Spoke to pt and advised per Dr Deborra Medina. F/u scheduled with Dr Lorelei Pont and advised pt to await a call with appt details

## 2015-08-09 DIAGNOSIS — Z1231 Encounter for screening mammogram for malignant neoplasm of breast: Secondary | ICD-10-CM | POA: Diagnosis not present

## 2015-08-09 DIAGNOSIS — M85851 Other specified disorders of bone density and structure, right thigh: Secondary | ICD-10-CM | POA: Diagnosis not present

## 2015-08-09 DIAGNOSIS — M85852 Other specified disorders of bone density and structure, left thigh: Secondary | ICD-10-CM | POA: Diagnosis not present

## 2015-08-10 ENCOUNTER — Ambulatory Visit: Payer: Commercial Managed Care - HMO | Admitting: Physical Therapy

## 2015-08-11 ENCOUNTER — Ambulatory Visit
Admission: RE | Admit: 2015-08-11 | Discharge: 2015-08-11 | Disposition: A | Discharge status: Home / Self Care | Payer: Commercial Managed Care - HMO | Source: Ambulatory Visit | Attending: Family Medicine | Admitting: Family Medicine

## 2015-08-11 DIAGNOSIS — M5126 Other intervertebral disc displacement, lumbar region: Secondary | ICD-10-CM | POA: Diagnosis not present

## 2015-08-11 DIAGNOSIS — M5410 Radiculopathy, site unspecified: Secondary | ICD-10-CM

## 2015-08-15 ENCOUNTER — Ambulatory Visit (INDEPENDENT_AMBULATORY_CARE_PROVIDER_SITE_OTHER): Payer: Commercial Managed Care - HMO | Admitting: Family Medicine

## 2015-08-15 ENCOUNTER — Encounter: Payer: Self-pay | Admitting: Family Medicine

## 2015-08-15 VITALS — BP 130/78 | HR 68 | Temp 98.5°F | Ht 62.0 in | Wt 134.2 lb

## 2015-08-15 DIAGNOSIS — M5416 Radiculopathy, lumbar region: Secondary | ICD-10-CM | POA: Diagnosis not present

## 2015-08-15 MED ORDER — GABAPENTIN 300 MG PO CAPS: 300.0000 mg | ORAL_CAPSULE | Freq: Three times a day (TID) | ORAL | Status: DC

## 2015-08-15 MED ORDER — PREDNISONE 20 MG PO TABS: ORAL_TABLET | ORAL | Status: DC

## 2015-08-15 NOTE — Progress Notes (Signed)
Pre visit review using our clinic review tool, if applicable. No additional management support is needed unless otherwise documented below in the visit note. 

## 2015-08-15 NOTE — Patient Instructions (Signed)

## 2015-08-15 NOTE — Progress Notes (Signed)
Dr. Karleen Hampshire T. Mckinnley Cottier, MD, CAQ [facility] [address] [place], [address] Phone: [phone] Fax: [phone]  08/15/2015  Patient: [name], MRN: [id number], DOB: November 19, 1945, 70 y.o.  Primary Physician:  Ruthe Mannan, MD   Chief Complaint  Patient presents with  . Back Pain    per Dr. Dayton Martes   Subjective:   [name] is a 70 y.o. very pleasant female patient who presents with the following:  Back pain: all started Easter Sunday, ongoing since with going down bilaterally. Hurting a lot and using a lot of ice packs. She is known very well, and I have known her for approximately 40 years.  Thus far, she has used some Zanaflex, which she did not think helped all that much.  She is also been to physical therapy, which also did not seem to help her pain and symptoms all too much.  She has not had any weakness, saddle anesthesia or bowel or bladder incontinence.  Exercises - not helping too much.   I reviewed her MRI results with her face-to-face, which was previously ordered.  Past Medical History, Surgical History, Social History, Family History, Problem List, Medications, and Allergies have been reviewed and updated if relevant.  Patient Active Problem List   Diagnosis Date Noted  . Bilateral radicular pain 08/07/2015  . Medicare annual wellness visit, subsequent 07/17/2015  . Back pain 07/17/2015  . Eustachian tube dysfunction 10/27/2014  . Depressed 01/05/2013  . Insomnia 07/12/2012  . Grief reaction 07/12/2012  . HIATAL HERNIA 10/08/2007  . COLONIC POLYPS, HYPERPLASTIC, HX OF 10/08/2007  . Cardiomyopathy ([facility]) 06/22/2007  . EROSIVE ESOPHAGITIS 06/22/2007  . GERD 06/22/2007  . HYPERCHOLESTEROLEMIA, PURE 08/11/2006    Past Medical History  Diagnosis Date  . Migraine   . Cardiomyopathy     nonischemic  . GERD (gastroesophageal reflux disease)   . Erosive esophagitis     LA Classification Grade B  .  Hyperlipidemia   . Osteopenia   . Hiatal hernia   . Diabetes mellitus     pt states she is not diabetic  . Sessile colonic polyp 10/2011    adenoma  . Melanoma ([facility])     basil cell, pt reports no melanoma    Past Surgical History  Procedure Laterality Date  . Tubal ligation    . Vaginal hysterectomy    . Appendectomy      Social History   Social History  . Marital Status: Married    Spouse Name: N/A  . Number of Children: 3  . Years of Education: N/A   Occupational History  . Housewife    Social History Main Topics  . Smoking status: Former Smoker    Quit date: 06/03/1981  . Smokeless tobacco: Never Used  . Alcohol Use: 1.8 oz/week    3 Glasses of wine per week     Comment: Wine on occasional  . Drug Use: No  . Sexual Activity: Not on file   Other Topics Concern  . Not on file   Social History Narrative   Lives alone in two-story home.     Her husband passed away unexpectedly.  They have three children- one is a pediatrician.   Desires CPR.    Family History  Problem Relation Age of Onset  . Heart disease Father   . Alzheimer's disease Mother   . Colon cancer Neg Hx   . Stomach cancer Neg Hx   . Rectal  cancer Neg Hx   . Esophageal cancer Neg Hx   . Arthritis/Rheumatoid Sister     Allergies  Allergen Reactions  . Penicillins Hives  . Lansoprazole     REACTION: headaches  . Sulfa Antibiotics Hives  . Ceclor [Cefaclor] Itching and Rash  . Ceftin [Cefuroxime Axetil] Rash  . Clindamycin/Lincomycin Itching and Rash  . Latex Itching and Rash  . Lincomycin Hcl Itching and Rash  . Septra [Sulfamethoxazole-Trimethoprim] Rash    Medication list reviewed and updated in full in [facility].  GEN: no acute illness or fever CV: No chest pain or shortness of breath MSK: detailed above Neuro: neurological signs are described above ROS O/w per HPI  Objective:   BP 130/78 mmHg  Pulse 68  Temp(Src) 98.5 F (36.9 C) (Oral)  Ht 5\' 2"  (1.575 m)  Wt  134 lb 4 oz (60.895 kg)  BMI 24.55 kg/m2   GEN: Well-developed,well-nourished,in no acute distress; alert,appropriate and cooperative throughout examination HEENT: Normocephalic and atraumatic without obvious abnormalities. Ears, externally no deformities PULM: Breathing comfortably in no respiratory distress EXT: No clubbing, cyanosis, or edema PSYCH: Normally interactive. Cooperative during the interview. Pleasant. Friendly and conversant. Not anxious or depressed appearing. Normal, full affect.  Range of motion at  the waist: Flexion, extension, lateral bending and rotation: modest restriction in flexion.  Extension is normal.  Lateral bending is grossly normal.   No echymosis or edema Rises to examination table with mild difficulty Gait: minimally antalgic  Inspection/Deformity: N Paraspinus Tenderness: mild tenderness from L2-S1 bilaterally  B Ankle Dorsiflexion (L5,4): 5/5 B Great Toe Dorsiflexion (L5,4): 5/5 Heel Walk (L5): WNL Toe Walk (S1): WNL Rise/Squat (L4): WNL, mild pain  SENSORY B Medial Foot (L4): WNL B Dorsum (L5): WNL B Lateral (S1): WNL Light Touch: WNL Pinprick: WNL  REFLEXES Knee (L4): 2+ Ankle (S1): 2+  B SLR, seated: neg B SLR, supine: neg B FABER: neg B Reverse FABER: neg B Greater Troch: NT B Log Roll: neg B Stork: NT B Sciatic Notch: NT   Radiology: Mr Lumbar Spine Wo Contrast  08/11/2015  CLINICAL DATA:  Low back pain extending into the lower extremities bilaterally. EXAM: MRI LUMBAR SPINE WITHOUT CONTRAST TECHNIQUE: Multiplanar, multisequence MR imaging of the lumbar spine was performed. No intravenous contrast was administered. COMPARISON:  Lumbar spine radiographs 07/04/2015 FINDINGS: Segmentation: 5 non rib-bearing lumbar type vertebral bodies are present. Alignment: Slight anterolisthesis is present at L4-5. AP alignment is otherwise anatomic. Mild rightward curvature centered at L4. Vertebrae: There is mild diffuse fatty infiltration of  the marrow containing spaces. A hemangioma is present along the inferior aspect of L1. Conus medullaris: Extends to the T12 level and appears normal. Paraspinal and other soft tissues: Limited imaging of the abdomen is unremarkable. No significant adenopathy is present. Disc levels: L1-2:  Negative. L2-3: Mild disc bulging and facet hypertrophy is present bilaterally without significant stenosis. L3-4: A broad-based disc protrusion is present. Mild facet hypertrophy is noted bilaterally. Mild foraminal narrowing is worse on the right. L4-5: There is uncovering of a mild broad-based disc protrusion. Moderate facet hypertrophy is noted bilaterally. No significant stenosis is present. L5-S1: Moderate right and mild left facet hypertrophy is present. There is no significant stenosis. IMPRESSION: 1. Mild broad-based disc protrusion and facet hypertrophy at L3-4 with mild foraminal narrowing bilaterally, worse on the right. 2. Moderate facet hypertrophy bilaterally at L4-5 and on the right L5-S1 without focal disc protrusion. Electronically Signed   By: Virl Son.D.  On: 08/11/2015 15:52    Assessment and Plan:   Lumbar radiculopathy, acute  Upcoming trip with her grandchildren.  No red flags. Bilateral radiculopathy, but her MRI shows no emergent findings. For now, continue conservative management.  Prednisone 10 days.  If she become symptomatic from side effects, suggested that she could change her dose to 20 mg and do this alone for 10 days.  Trial of neuropathic pain medication.  Follow-up: Return in about 6 weeks (around 09/26/2015).  New Prescriptions   GABAPENTIN (NEURONTIN) 300 MG CAPSULE    Take 1 capsule (300 mg total) by mouth 3 (three) times daily.   PREDNISONE (DELTASONE) 20 MG TABLET    2 tabs po for 5 days, then 1 tab po daily x 5 days   Signed,  Jemima Petko T. Needham Biggins, MD   Patient's Medications  New Prescriptions   GABAPENTIN (NEURONTIN) 300 MG CAPSULE    Take 1 capsule  (300 mg total) by mouth 3 (three) times daily.   PREDNISONE (DELTASONE) 20 MG TABLET    2 tabs po for 5 days, then 1 tab po daily x 5 days  Previous Medications   AMLODIPINE (NORVASC) 2.5 MG TABLET    Take 1 tablet (2.5 mg total) by mouth daily.   CARVEDILOL (COREG) 12.5 MG TABLET    Take 1 tablet (12.5 mg total) by mouth 2 (two) times daily with a meal.   ENALAPRIL (VASOTEC) 10 MG TABLET    Take 1 tablet (10 mg total) by mouth daily.   ESCITALOPRAM (LEXAPRO) 20 MG TABLET    Take 1 tablet (20 mg total) by mouth daily.   MULTIPLE VITAMIN (MULTIVITAMIN) CAPSULE    Take 1 capsule by mouth daily.     PANTOPRAZOLE (PROTONIX) 40 MG TABLET    Take 1 tablet (40 mg total) by mouth 2 (two) times daily.   PROMETHAZINE (PHENERGAN) 12.5 MG TABLET    TAKE ONE TABLET EVERY EIGHT HOURS AS NEEDED FOR NAUSEA / VOMITING   SIMVASTATIN (ZOCOR) 20 MG TABLET    Take 1 tablet (20 mg total) by mouth at bedtime.   ZOLPIDEM (AMBIEN) 10 MG TABLET    TAKE ONE TABLET AT BEDTIME  Modified Medications   No medications on file  Discontinued Medications   TIZANIDINE (ZANAFLEX) 2 MG TABLET    Take 1 tablet (2 mg total) by mouth every 8 (eight) hours as needed for muscle spasms.   TRIAMCINOLONE CREAM (KENALOG) 0.1 %

## 2015-08-16 ENCOUNTER — Encounter: Payer: Self-pay | Admitting: *Deleted

## 2015-08-27 ENCOUNTER — Other Ambulatory Visit: Payer: Self-pay | Admitting: Family Medicine

## 2015-08-27 ENCOUNTER — Telehealth: Payer: Self-pay

## 2015-08-27 NOTE — Telephone Encounter (Signed)
Pt left v/m; pt was seen 08/15/15; pt's back pain continues; pt cannot take neurontin 300 mg tid because pt feels "out of it during the day". Pt request [facility] for [facility]. Pt request cb.

## 2015-08-27 NOTE — Telephone Encounter (Signed)
Rx called in to requested pharmacy 

## 2015-08-27 NOTE — Telephone Encounter (Signed)
Significant sedation from gabapentin -   Still with significant pain.  Send in Lyrica in the AM

## 2015-08-27 NOTE — Telephone Encounter (Signed)
Last f/u 07/2015-CPE 

## 2015-08-28 ENCOUNTER — Other Ambulatory Visit: Payer: Self-pay | Admitting: Family Medicine

## 2015-08-28 MED ORDER — PREGABALIN 50 MG PO CAPS: 50.0000 mg | ORAL_CAPSULE | Freq: Two times a day (BID) | ORAL | Status: DC

## 2015-08-28 NOTE — Telephone Encounter (Signed)
Printed rx for lyrica - please fax to [facility] in [place]

## 2015-08-28 NOTE — Telephone Encounter (Addendum)
Lyrica Rx faxed to [facility] at [phone].  Ms. Meduna notified as instructed by telephone.

## 2015-09-10 ENCOUNTER — Other Ambulatory Visit: Payer: Self-pay | Admitting: Primary Care

## 2015-09-18 ENCOUNTER — Telehealth: Payer: Self-pay

## 2015-09-18 DIAGNOSIS — M5440 Lumbago with sciatica, unspecified side: Secondary | ICD-10-CM

## 2015-09-18 NOTE — Telephone Encounter (Signed)
Pt left v/m; pt last seen 08/15/15; pt is no better with back pain; pain keeps pt from resting at night; pt request referral to [facility] to see anyone of the doctors there for some type of injections. Pt has family member that is a doctor and he recommends pt seeing a neurosurgeon. Pt request cb with referral.

## 2015-09-18 NOTE — Telephone Encounter (Signed)
Referral placed for neurosurgery.

## 2015-09-26 ENCOUNTER — Other Ambulatory Visit: Payer: Self-pay | Admitting: *Deleted

## 2015-09-26 MED ORDER — ZOLPIDEM TARTRATE 10 MG PO TABS: 10.0000 mg | ORAL_TABLET | Freq: Every day | ORAL | Status: DC

## 2015-09-26 NOTE — Telephone Encounter (Signed)
Rx called in to requested pharmacy 

## 2015-09-26 NOTE — Telephone Encounter (Signed)
Last f/u 07/2015-CPE 

## 2015-09-28 ENCOUNTER — Ambulatory Visit (INDEPENDENT_AMBULATORY_CARE_PROVIDER_SITE_OTHER): Payer: Commercial Managed Care - HMO | Admitting: Family Medicine

## 2015-09-28 ENCOUNTER — Encounter: Payer: Self-pay | Admitting: Family Medicine

## 2015-09-28 VITALS — BP 120/66 | HR 73 | Temp 98.3°F | Ht 62.0 in | Wt 137.5 lb

## 2015-09-28 DIAGNOSIS — M5440 Lumbago with sciatica, unspecified side: Secondary | ICD-10-CM | POA: Diagnosis not present

## 2015-09-28 MED ORDER — PREGABALIN 75 MG PO CAPS: 75.0000 mg | ORAL_CAPSULE | Freq: Two times a day (BID) | ORAL | Status: DC

## 2015-09-28 NOTE — Progress Notes (Signed)
Pre visit review using our clinic review tool, if applicable. No additional management support is needed unless otherwise documented below in the visit note. 

## 2015-09-28 NOTE — Progress Notes (Signed)
Dr. Karleen Hampshire T. Khylie Larmore, MD, CAQ Sports Medicine Primary [facility] and Sports Medicine [address] [place], [address] Phone: [phone] Fax: [phone]  09/28/2015  Patient: [name], MRN: [id number], DOB: March 15, 1945, 70 y.o.  Primary Physician:  Ruthe Mannan, MD   Chief Complaint  Patient presents with  . Follow-up    Lumbar Radiculopathy   Subjective:   [name] is a 70 y.o. very pleasant female patient who presents with the following:  Painful some, but not as painful, tking some muscle relaxant. No side effects.  Overall, she is doing better, her pain is diminished, and her radicular pain is improved quite a bit compared to the last time I saw her.  Overall she feels well, but not completely back to baseline.  08/15/2015 Last OV with Hannah Beat, MD  Back pain: all started Easter Sunday, ongoing since with going down bilaterally. Hurting a lot and using a lot of ice packs. She is known very well, and I have known her for approximately 40 years.  Thus far, she has used some Zanaflex, which she did not think helped all that much.  She is also been to physical therapy, which also did not seem to help her pain and symptoms all too much.  She has not had any weakness, saddle anesthesia or bowel or bladder incontinence.  Exercises - not helping too much.   I reviewed her MRI results with her face-to-face, which was previously ordered.  Past Medical History, Surgical History, Social History, Family History, Problem List, Medications, and Allergies have been reviewed and updated if relevant.  Patient Active Problem List   Diagnosis Date Noted  . Bilateral radicular pain 08/07/2015  . Medicare annual wellness visit, subsequent 07/17/2015  . Back pain 07/17/2015  . Eustachian tube dysfunction 10/27/2014  . Depressed 01/05/2013  . Insomnia 07/12/2012  . Grief reaction 07/12/2012  . HIATAL HERNIA 10/08/2007  . COLONIC POLYPS, HYPERPLASTIC, HX OF 10/08/2007  .  Cardiomyopathy (HCC) 06/22/2007  . EROSIVE ESOPHAGITIS 06/22/2007  . GERD 06/22/2007  . HYPERCHOLESTEROLEMIA, PURE 08/11/2006    Past Medical History  Diagnosis Date  . Migraine   . Cardiomyopathy     nonischemic  . GERD (gastroesophageal reflux disease)   . Erosive esophagitis     LA Classification Grade B  . Hyperlipidemia   . Osteopenia   . Hiatal hernia   . Diabetes mellitus     pt states she is not diabetic  . Sessile colonic polyp 10/2011    adenoma  . Melanoma (HCC)     basil cell, pt reports no melanoma    Past Surgical History  Procedure Laterality Date  . Tubal ligation    . Vaginal hysterectomy    . Appendectomy      Social History   Social History  . Marital Status: Married    Spouse Name: N/A  . Number of Children: 3  . Years of Education: N/A   Occupational History  . Housewife    Social History Main Topics  . Smoking status: Former Smoker    Quit date: 06/03/1981  . Smokeless tobacco: Never Used  . Alcohol Use: 1.8 oz/week    3 Glasses of wine per week     Comment: Wine on occasional  . Drug Use: No  . Sexual Activity: Not on file   Other Topics Concern  . Not on file   Social History Narrative   Lives alone in two-story home.  Her husband passed away unexpectedly.  They have three children- one is a pediatrician.   Desires CPR.    Family History  Problem Relation Age of Onset  . Heart disease Father   . Alzheimer's disease Mother   . Colon cancer Neg Hx   . Stomach cancer Neg Hx   . Rectal cancer Neg Hx   . Esophageal cancer Neg Hx   . Arthritis/Rheumatoid Sister     Allergies  Allergen Reactions  . Penicillins Hives  . Lansoprazole     REACTION: headaches  . Sulfa Antibiotics Hives  . Ceclor [Cefaclor] Itching and Rash  . Ceftin [Cefuroxime Axetil] Rash  . Clindamycin/Lincomycin Itching and Rash  . Latex Itching and Rash  . Lincomycin Hcl Itching and Rash  . Septra [Sulfamethoxazole-Trimethoprim] Rash     Medication list reviewed and updated in full in [facility].  GEN: no acute illness or fever CV: No chest pain or shortness of breath MSK: detailed above Neuro: neurological signs are described above ROS O/w per HPI  Objective:   BP 120/66 mmHg  Pulse 73  Temp(Src) 98.3 F (36.8 C) (Oral)  Ht 5\' 2"  (1.575 m)  Wt 137 lb 8 oz (62.37 kg)  BMI 25.14 kg/m2   GEN: Well-developed,well-nourished,in no acute distress; alert,appropriate and cooperative throughout examination HEENT: Normocephalic and atraumatic without obvious abnormalities. Ears, externally no deformities PULM: Breathing comfortably in no respiratory distress EXT: No clubbing, cyanosis, or edema PSYCH: Normally interactive. Cooperative during the interview. Pleasant. Friendly and conversant. Not anxious or depressed appearing. Normal, full affect.  Range of motion at  the waist: Flexion, extension, lateral bending and rotation: modest restriction in flexion.  Extension is normal.  Lateral bending is grossly normal.   No echymosis or edema Rises to examination table with mild difficulty Gait: minimally antalgic  Inspection/Deformity: N Paraspinus Tenderness: mild tenderness from L2-S1 bilaterally  B Ankle Dorsiflexion (L5,4): 5/5 B Great Toe Dorsiflexion (L5,4): 5/5 Heel Walk (L5): WNL Toe Walk (S1): WNL Rise/Squat (L4): WNL, mild pain  SENSORY B Medial Foot (L4): WNL B Dorsum (L5): WNL B Lateral (S1): WNL Light Touch: WNL Pinprick: WNL  REFLEXES Knee (L4): 2+ Ankle (S1): 2+  B SLR, seated: neg B SLR, supine: neg B FABER: neg B Reverse FABER: neg B Greater Troch: NT B Log Roll: neg B Stork: NT B Sciatic Notch: NT   Radiology: No results found.  Assessment and Plan:   Low back pain with sciatica, sciatica laterality unspecified, unspecified back pain laterality  Doing well, increase her Lyrica to 75 mg p.o. B.i.d.  If think that it is certainly reasonable to also use some muscle  relaxants that she has been doing and try to remain active as she can.  Follow-up: prn only  Modified Medications   Modified Medication Previous Medication   PREGABALIN (LYRICA) 75 MG CAPSULE pregabalin (LYRICA) 50 MG capsule      Take 1 capsule (75 mg total) by mouth 2 (two) times daily.    Take 1 capsule (50 mg total) by mouth 2 (two) times daily.    Signed,  Elpidio Galea. Labrisha Wuellner, MD   Patient's Medications  New Prescriptions   No medications on file  Previous Medications   AMLODIPINE (NORVASC) 2.5 MG TABLET    Take 1 tablet (2.5 mg total) by mouth daily.   CARVEDILOL (COREG) 12.5 MG TABLET    Take 1 tablet (12.5 mg total) by mouth 2 (two) times daily with a meal.   ENALAPRIL (VASOTEC)  10 MG TABLET    Take 1 tablet (10 mg total) by mouth daily.   ESCITALOPRAM (LEXAPRO) 20 MG TABLET    Take 1 tablet (20 mg total) by mouth daily.   MULTIPLE VITAMIN (MULTIVITAMIN) CAPSULE    Take 1 capsule by mouth daily.     PANTOPRAZOLE (PROTONIX) 40 MG TABLET    Take 1 tablet (40 mg total) by mouth 2 (two) times daily.   PROMETHAZINE (PHENERGAN) 12.5 MG TABLET    TAKE ONE TABLET EVERY EIGHT HOURS AS NEEDED FOR NAUSEA / VOMITING   SIMVASTATIN (ZOCOR) 20 MG TABLET    Take 1 tablet (20 mg total) by mouth at bedtime.   TIZANIDINE (ZANAFLEX) 2 MG TABLET    TAKE 1 TABLET BY MOUTH EVERY 8 HOURS AS NEEDED FOR MUSCLE SPASMS   ZOLPIDEM (AMBIEN) 10 MG TABLET    Take 1 tablet (10 mg total) by mouth at bedtime.  Modified Medications   Modified Medication Previous Medication   PREGABALIN (LYRICA) 75 MG CAPSULE pregabalin (LYRICA) 50 MG capsule      Take 1 capsule (75 mg total) by mouth 2 (two) times daily.    Take 1 capsule (50 mg total) by mouth 2 (two) times daily.  Discontinued Medications   PREDNISONE (DELTASONE) 20 MG TABLET    2 tabs po for 5 days, then 1 tab po daily x 5 days

## 2015-10-18 ENCOUNTER — Telehealth: Payer: Self-pay

## 2015-10-18 DIAGNOSIS — M5416 Radiculopathy, lumbar region: Secondary | ICD-10-CM | POA: Diagnosis not present

## 2015-10-18 DIAGNOSIS — M47816 Spondylosis without myelopathy or radiculopathy, lumbar region: Secondary | ICD-10-CM | POA: Diagnosis not present

## 2015-10-18 DIAGNOSIS — M5126 Other intervertebral disc displacement, lumbar region: Secondary | ICD-10-CM | POA: Diagnosis not present

## 2015-10-18 NOTE — Telephone Encounter (Signed)
Will defer to Dr. Deborra Medina on this.

## 2015-10-18 NOTE — Telephone Encounter (Signed)
Pt left v/m; pt saw Dr Dema Severin, pain mgt doctor and is scheduled for injection in her back on 10/23/15; when pt was leaving office pt had 2 people advise her not to let Dr Dema Severin inject her back; pt is concerned and wants to know if Dr Deborra Medina can refer pt to radiologist at [facility] imaging for spinal injection. Pt request cb.

## 2015-10-22 NOTE — Telephone Encounter (Signed)
Dr. Lorelei Pont, do you have any suggestions?

## 2015-10-22 NOTE — Telephone Encounter (Signed)
I tend to send the most people to either  Dr. Leanord Hawking at [facility] in [place]  Or  Dr. Irene Shipper at [facility]  They do a good job and are both very nice.  Primary training is physical medicine and rehab, but practice is primarily interventional spine work

## 2015-10-24 ENCOUNTER — Telehealth: Payer: Self-pay | Admitting: Family Medicine

## 2015-10-24 ENCOUNTER — Other Ambulatory Visit: Payer: Self-pay

## 2015-10-24 DIAGNOSIS — M549 Dorsalgia, unspecified: Secondary | ICD-10-CM

## 2015-10-24 MED ORDER — ZOLPIDEM TARTRATE 10 MG PO TABS: 10.0000 mg | ORAL_TABLET | Freq: Every day | ORAL | 1 refills | Status: DC

## 2015-10-24 NOTE — Telephone Encounter (Signed)
Lm on pts vm requesting a call back 

## 2015-10-24 NOTE — Telephone Encounter (Signed)
You can put another referral in to [facility] and in notes put Dr. Nelva Bush. Thank you

## 2015-10-24 NOTE — Telephone Encounter (Signed)
Pt would like to go see Dr. Nelva Bush at [facility]. Thanks.

## 2015-10-24 NOTE — Telephone Encounter (Signed)
Phoned in to [place], [place]: [phone]

## 2015-10-24 NOTE — Telephone Encounter (Signed)
Thank you!  Referral placed.

## 2015-10-24 NOTE — Telephone Encounter (Signed)
Do I need to do anything ?

## 2015-10-24 NOTE — Telephone Encounter (Signed)
Please see Dr. Lillie Fragmin recommendations.

## 2015-10-24 NOTE — Telephone Encounter (Signed)
Filled 09/26/15 #30, last OV 7/21. Ok to refill?

## 2015-10-25 NOTE — Telephone Encounter (Signed)
Spoke to pt and advised per Dr Lorelei Pont. Pt prefers [facility] and was advised to await a call with appt details

## 2015-11-14 DIAGNOSIS — R52 Pain, unspecified: Secondary | ICD-10-CM | POA: Diagnosis not present

## 2015-11-14 DIAGNOSIS — L570 Actinic keratosis: Secondary | ICD-10-CM | POA: Diagnosis not present

## 2015-12-05 DIAGNOSIS — M5136 Other intervertebral disc degeneration, lumbar region: Secondary | ICD-10-CM | POA: Diagnosis not present

## 2015-12-05 DIAGNOSIS — M47816 Spondylosis without myelopathy or radiculopathy, lumbar region: Secondary | ICD-10-CM | POA: Diagnosis not present

## 2015-12-17 ENCOUNTER — Other Ambulatory Visit: Payer: Self-pay | Admitting: Family Medicine

## 2015-12-19 DIAGNOSIS — H40003 Preglaucoma, unspecified, bilateral: Secondary | ICD-10-CM | POA: Diagnosis not present

## 2015-12-25 ENCOUNTER — Other Ambulatory Visit: Payer: Self-pay | Admitting: Internal Medicine

## 2015-12-26 ENCOUNTER — Telehealth: Payer: Self-pay | Admitting: *Deleted

## 2015-12-26 DIAGNOSIS — M47816 Spondylosis without myelopathy or radiculopathy, lumbar region: Secondary | ICD-10-CM | POA: Diagnosis not present

## 2015-12-26 NOTE — Telephone Encounter (Signed)
Last f/u 07/2015-CPE 

## 2015-12-27 NOTE — Telephone Encounter (Signed)
Pt called to check on the status of her refill for Ambien.  Can you please contact her to let her know if it will be refilled.  Thanks.

## 2015-12-27 NOTE — Telephone Encounter (Signed)
Ok to refill as requested 

## 2015-12-28 MED ORDER — ZOLPIDEM TARTRATE 10 MG PO TABS: 10.0000 mg | ORAL_TABLET | Freq: Every day | ORAL | 0 refills | Status: DC

## 2015-12-28 NOTE — Telephone Encounter (Signed)
Rx called in to requested pharmacy 

## 2016-01-09 DIAGNOSIS — M47816 Spondylosis without myelopathy or radiculopathy, lumbar region: Secondary | ICD-10-CM | POA: Diagnosis not present

## 2016-01-09 DIAGNOSIS — M5136 Other intervertebral disc degeneration, lumbar region: Secondary | ICD-10-CM | POA: Diagnosis not present

## 2016-01-10 DIAGNOSIS — M19042 Primary osteoarthritis, left hand: Secondary | ICD-10-CM | POA: Diagnosis not present

## 2016-01-10 DIAGNOSIS — M19041 Primary osteoarthritis, right hand: Secondary | ICD-10-CM | POA: Diagnosis not present

## 2016-01-10 DIAGNOSIS — G5603 Carpal tunnel syndrome, bilateral upper limbs: Secondary | ICD-10-CM | POA: Diagnosis not present

## 2016-01-21 DIAGNOSIS — Z85828 Personal history of other malignant neoplasm of skin: Secondary | ICD-10-CM | POA: Diagnosis not present

## 2016-01-21 DIAGNOSIS — L570 Actinic keratosis: Secondary | ICD-10-CM | POA: Diagnosis not present

## 2016-01-21 DIAGNOSIS — D485 Neoplasm of uncertain behavior of skin: Secondary | ICD-10-CM | POA: Diagnosis not present

## 2016-01-21 DIAGNOSIS — D2239 Melanocytic nevi of other parts of face: Secondary | ICD-10-CM | POA: Diagnosis not present

## 2016-01-21 DIAGNOSIS — D044 Carcinoma in situ of skin of scalp and neck: Secondary | ICD-10-CM | POA: Diagnosis not present

## 2016-01-21 DIAGNOSIS — L821 Other seborrheic keratosis: Secondary | ICD-10-CM | POA: Diagnosis not present

## 2016-01-24 ENCOUNTER — Other Ambulatory Visit: Payer: Self-pay | Admitting: *Deleted

## 2016-01-24 MED ORDER — ZOLPIDEM TARTRATE 10 MG PO TABS: 10.0000 mg | ORAL_TABLET | Freq: Every day | ORAL | 0 refills | Status: DC

## 2016-01-24 NOTE — Telephone Encounter (Signed)
Ok to phone in Ambien 

## 2016-01-24 NOTE — Telephone Encounter (Signed)
Last f/u 07/2015 

## 2016-01-24 NOTE — Telephone Encounter (Signed)
Rx called in to requested pharmacy 

## 2016-02-11 ENCOUNTER — Other Ambulatory Visit: Payer: Self-pay | Admitting: Internal Medicine

## 2016-02-13 DIAGNOSIS — M47816 Spondylosis without myelopathy or radiculopathy, lumbar region: Secondary | ICD-10-CM | POA: Diagnosis not present

## 2016-02-20 ENCOUNTER — Other Ambulatory Visit: Payer: Self-pay | Admitting: Family Medicine

## 2016-02-20 NOTE — Telephone Encounter (Signed)
Last f/u 07/2015 

## 2016-02-21 DIAGNOSIS — D044 Carcinoma in situ of skin of scalp and neck: Secondary | ICD-10-CM | POA: Diagnosis not present

## 2016-02-21 NOTE — Telephone Encounter (Signed)
Rx called in to requested pharmacy 

## 2016-02-26 DIAGNOSIS — H2511 Age-related nuclear cataract, right eye: Secondary | ICD-10-CM | POA: Diagnosis not present

## 2016-03-04 ENCOUNTER — Encounter: Payer: Self-pay | Admitting: *Deleted

## 2016-03-18 ENCOUNTER — Encounter: Payer: Self-pay | Admitting: *Deleted

## 2016-03-18 ENCOUNTER — Ambulatory Visit
Admission: RE | Admit: 2016-03-18 | Discharge: 2016-03-18 | Disposition: A | Discharge status: Home / Self Care | Payer: Commercial Managed Care - HMO | Source: Ambulatory Visit | Attending: Ophthalmology | Admitting: Ophthalmology

## 2016-03-18 ENCOUNTER — Ambulatory Visit: Payer: Commercial Managed Care - HMO | Admitting: Anesthesiology

## 2016-03-18 ENCOUNTER — Encounter
Admission: RE | Disposition: A | Discharge status: Home / Self Care | Payer: Self-pay | Source: Ambulatory Visit | Attending: Ophthalmology

## 2016-03-18 DIAGNOSIS — E785 Hyperlipidemia, unspecified: Secondary | ICD-10-CM | POA: Diagnosis not present

## 2016-03-18 DIAGNOSIS — E1136 Type 2 diabetes mellitus with diabetic cataract: Secondary | ICD-10-CM | POA: Diagnosis not present

## 2016-03-18 DIAGNOSIS — K449 Diaphragmatic hernia without obstruction or gangrene: Secondary | ICD-10-CM | POA: Insufficient documentation

## 2016-03-18 DIAGNOSIS — Z87891 Personal history of nicotine dependence: Secondary | ICD-10-CM | POA: Diagnosis not present

## 2016-03-18 DIAGNOSIS — F329 Major depressive disorder, single episode, unspecified: Secondary | ICD-10-CM | POA: Insufficient documentation

## 2016-03-18 DIAGNOSIS — R51 Headache: Secondary | ICD-10-CM | POA: Insufficient documentation

## 2016-03-18 DIAGNOSIS — K219 Gastro-esophageal reflux disease without esophagitis: Secondary | ICD-10-CM | POA: Diagnosis not present

## 2016-03-18 DIAGNOSIS — H2511 Age-related nuclear cataract, right eye: Secondary | ICD-10-CM | POA: Diagnosis not present

## 2016-03-18 DIAGNOSIS — E119 Type 2 diabetes mellitus without complications: Secondary | ICD-10-CM | POA: Diagnosis not present

## 2016-03-18 HISTORY — PX: CATARACT EXTRACTION W/PHACO: SHX586

## 2016-03-18 SURGERY — PHACOEMULSIFICATION, CATARACT, WITH IOL INSERTION
Anesthesia: Monitor Anesthesia Care | Site: Eye | Laterality: Right | Wound class: Clean

## 2016-03-18 MED ORDER — ARMC OPHTHALMIC DILATING DROPS
1.0000 "application " | OPHTHALMIC | Status: AC
  Administered 2016-03-18 (×3): 1 via OPHTHALMIC

## 2016-03-18 MED ORDER — MOXIFLOXACIN HCL 0.5 % OP SOLN
1.0000 [drp] | OPHTHALMIC | Status: AC
  Administered 2016-03-18 (×2): 1 [drp] via OPHTHALMIC

## 2016-03-18 MED ORDER — MIDAZOLAM HCL 2 MG/2ML IJ SOLN
INTRAMUSCULAR | Status: DC | PRN
  Administered 2016-03-18 (×2): 1 mg via INTRAVENOUS

## 2016-03-18 MED ORDER — ACETYLCHOLINE CHLORIDE 20 MG IO SOLR
INTRAOCULAR | Status: DC | PRN
  Administered 2016-03-18: 10 mg via INTRAOCULAR

## 2016-03-18 MED ORDER — MIDAZOLAM HCL 2 MG/2ML IJ SOLN
INTRAMUSCULAR | Status: AC
  Filled 2016-03-18: qty 2

## 2016-03-18 MED ORDER — EPINEPHRINE PF 1 MG/ML IJ SOLN
INTRAOCULAR | Status: DC | PRN
  Administered 2016-03-18: 10:00:00 via OPHTHALMIC

## 2016-03-18 MED ORDER — FENTANYL CITRATE (PF) 100 MCG/2ML IJ SOLN
INTRAMUSCULAR | Status: AC
  Filled 2016-03-18: qty 2

## 2016-03-18 MED ORDER — LIDOCAINE HCL (PF) 4 % IJ SOLN
INTRAOCULAR | Status: DC | PRN
  Administered 2016-03-18: 4 mL via OPHTHALMIC

## 2016-03-18 MED ORDER — NA CHONDROIT SULF-NA HYALURON 40-17 MG/ML IO SOLN
INTRAOCULAR | Status: DC | PRN
  Administered 2016-03-18: 1 mL via INTRAOCULAR

## 2016-03-18 MED ORDER — ARMC OPHTHALMIC DILATING DROPS
OPHTHALMIC | Status: AC
  Filled 2016-03-18: qty 0.4

## 2016-03-18 MED ORDER — ACETYLCHOLINE CHLORIDE 20 MG IO SOLR
INTRAOCULAR | Status: AC
  Filled 2016-03-18: qty 1

## 2016-03-18 MED ORDER — CARBACHOL 0.01 % IO SOLN: INTRAOCULAR | Status: DC | PRN

## 2016-03-18 MED ORDER — FENTANYL CITRATE (PF) 100 MCG/2ML IJ SOLN
INTRAMUSCULAR | Status: DC | PRN
  Administered 2016-03-18: 50 ug via INTRAVENOUS
  Administered 2016-03-18: 25 ug via INTRAVENOUS

## 2016-03-18 MED ORDER — SODIUM CHLORIDE 0.9 % IV SOLN
INTRAVENOUS | Status: DC
  Administered 2016-03-18: 08:00:00 via INTRAVENOUS

## 2016-03-18 MED ORDER — MOXIFLOXACIN HCL 0.5 % OP SOLN
OPHTHALMIC | Status: DC | PRN
  Administered 2016-03-18: 1 [drp] via OPHTHALMIC

## 2016-03-18 MED ORDER — MOXIFLOXACIN HCL 0.5 % OP SOLN
OPHTHALMIC | Status: AC
  Filled 2016-03-18: qty 3

## 2016-03-18 SURGICAL SUPPLY — 21 items
CANNULA ANT/CHMB 27GA (MISCELLANEOUS) ×2 IMPLANT
CUP MEDICINE 2OZ PLAST GRAD ST (MISCELLANEOUS) ×2 IMPLANT
GLOVE BIO SURGEON STRL SZ8 (GLOVE) ×2 IMPLANT
GLOVE BIOGEL M 6.5 STRL (GLOVE) ×2 IMPLANT
GLOVE SURG LX 8.0 MICRO (GLOVE) ×1
GLOVE SURG LX STRL 8.0 MICRO (GLOVE) ×1 IMPLANT
GOWN STRL REUS W/ TWL LRG LVL3 (GOWN DISPOSABLE) ×2 IMPLANT
GOWN STRL REUS W/TWL LRG LVL3 (GOWN DISPOSABLE) ×2
LENS IOL TECNIS ITEC 20.0 (Intraocular Lens) ×2 IMPLANT
PACK CATARACT (MISCELLANEOUS) ×2 IMPLANT
PACK CATARACT BRASINGTON LX (MISCELLANEOUS) ×2 IMPLANT
PACK EYE AFTER SURG (MISCELLANEOUS) ×2 IMPLANT
SOL BSS BAG (MISCELLANEOUS) ×2
SOL PREP PVP 2OZ (MISCELLANEOUS) ×2
SOLUTION BSS BAG (MISCELLANEOUS) ×1 IMPLANT
SOLUTION PREP PVP 2OZ (MISCELLANEOUS) ×1 IMPLANT
SYR 3ML LL SCALE MARK (SYRINGE) ×2 IMPLANT
SYR 5ML LL (SYRINGE) ×2 IMPLANT
SYR TB 1ML 27GX1/2 LL (SYRINGE) ×2 IMPLANT
WATER STERILE IRR 250ML POUR (IV SOLUTION) ×2 IMPLANT
WIPE NON LINTING 3.25X3.25 (MISCELLANEOUS) ×2 IMPLANT

## 2016-03-18 NOTE — Anesthesia Procedure Notes (Signed)
Procedure Name: MAC Date/Time: 03/18/2016 9:31 AM Performed by: Johnna Acosta Pre-anesthesia Checklist: Patient identified, Emergency Drugs available, Suction available, Patient being monitored and Timeout performed Patient Re-evaluated:Patient Re-evaluated prior to inductionOxygen Delivery Method: Nasal cannula

## 2016-03-18 NOTE — Op Note (Signed)
PREOPERATIVE DIAGNOSIS:  Nuclear sclerotic cataract of the right eye.   POSTOPERATIVE DIAGNOSIS:  Nuclear Sclerotic Cataract Right Eye   OPERATIVE PROCEDURE: Procedure(s): CATARACT EXTRACTION PHACO AND INTRAOCULAR LENS PLACEMENT (IOC)   SURGEON:  Birder [name], MD.   ANESTHESIA:  Anesthesiologist: Emmie Niemann, MD CRNA: Johnna Acosta, CRNA  1.      Managed anesthesia care. 2.      0.65ml of Shugarcaine was instilled in the eye following the paracentesis.   COMPLICATIONS:  None.   TECHNIQUE:   Stop and chop   DESCRIPTION OF PROCEDURE:  The patient was examined and consented in the preoperative holding area where the aforementioned topical anesthesia was applied to the right eye and then brought back to the Operating Room where the right eye was prepped and draped in the usual sterile ophthalmic fashion and a lid speculum was placed. A paracentesis was created with the side port blade and the anterior chamber was filled with viscoelastic. A near clear corneal incision was performed with the steel keratome. A continuous curvilinear capsulorrhexis was performed with a cystotome followed by the capsulorrhexis forceps. Hydrodissection and hydrodelineation were carried out with BSS on a blunt cannula. The lens was removed in a stop and chop  technique and the remaining cortical material was removed with the irrigation-aspiration handpiece. The capsular bag was inflated with viscoelastic and the Technis [id number]  lens was placed in the capsular bag without complication. The remaining viscoelastic was removed from the eye with the irrigation-aspiration handpiece. The wounds were hydrated. The anterior chamber was flushed with Miostat and the eye was inflated to physiologic pressure. 0.35ml of Vigamox was placed in the anterior chamber. The wounds were found to be water tight. The eye was dressed with Vigamox. The patient was given protective glasses to wear throughout the day and a shield with which  to sleep tonight. The patient was also given drops with which to begin a drop regimen today and will follow-up with me in one day.  Implant Name Type Inv. Item Serial No. Manufacturer Lot No. LRB No. Used  LENS IOL DIOP 20.0 - [id number] Intraocular Lens LENS IOL DIOP 20.0 [id number]   Right 1   Procedure(s) with comments: CATARACT EXTRACTION PHACO AND INTRAOCULAR LENS PLACEMENT (IOC) (Right) - [place] 45.3 AP% 22.2 CDE 10.07 Fluid pack lot # [id number]  Electronically signed: [place] 03/18/2016 9:48 AM

## 2016-03-18 NOTE — Transfer of Care (Signed)
Immediate Anesthesia Transfer of Care Note  Patient: [name]  Procedure(s) Performed: Procedure(s) with comments: CATARACT EXTRACTION PHACO AND INTRAOCULAR LENS PLACEMENT (IOC) (Right) - [place] 45.3 AP% 22.2 CDE 10.07 Fluid pack lot # [id number]  Patient Location: PACU  Anesthesia Type:MAC  Level of Consciousness: awake, alert  and oriented  Airway & Oxygen Therapy: Patient Spontanous Breathing  Post-op Assessment: Report given to RN and Post -op Vital signs reviewed and stable  Post vital signs: Reviewed and stable  Last Vitals:  Vitals:   03/18/16 0759 03/18/16 0950  BP: (!) 150/84 115/62  Pulse: 69 65  Resp: 18 16  Temp: 36.7 C (!) 35.8 C    Last Pain:  Vitals:   03/18/16 0950  TempSrc: Temporal         Complications: No apparent anesthesia complications

## 2016-03-18 NOTE — H&P (Signed)
All labs reviewed. Abnormal studies sent to patients PCP when indicated.  Previous H&P reviewed, patient examined, there are NO CHANGES.  [name] LOUIS1/9/20189:24 AM

## 2016-03-18 NOTE — Discharge Instructions (Signed)
Eye Surgery Discharge Instructions  Expect mild scratchy sensation or mild soreness. DO NOT RUB YOUR EYE!  The day of surgery:  Minimal physical activity, but bed rest is not required  No reading, computer work, or close hand work  No bending, lifting, or straining.  May watch TV  For 24 hours:  No driving, legal decisions, or alcoholic beverages  Safety precautions  Eat anything you prefer: It is better to start with liquids, then soup then solid foods.  _____ Eye patch should be worn until postoperative exam tomorrow.  ____ Solar shield eyeglasses should be worn for comfort in the sunlight/patch while sleeping  Resume all regular medications including aspirin or Coumadin if these were discontinued prior to surgery. You may shower, bathe, shave, or wash your hair. Tylenol may be taken for mild discomfort.  Call your doctor if you experience significant pain, nausea, or vomiting, fever > 101 or other signs of infection. [phone] or [phone] Specific instructions:  Follow-up Information    PORFILIO,WILLIAM LOUIS, MD Follow up.   Specialty:  Ophthalmology Why:  03/19/16 at 9:10 Contact information: [place] [address] [phone]

## 2016-03-18 NOTE — Anesthesia Preprocedure Evaluation (Signed)
Anesthesia Evaluation  Patient identified by MRN, date of birth, ID band Patient awake    Reviewed: Allergy & Precautions, NPO status , Patient's Chart, lab work & pertinent test results  History of Anesthesia Complications Negative for: history of anesthetic complications  Airway Mallampati: II  TM Distance: >3 FB Neck ROM: Full    Dental no notable dental hx.    Pulmonary neg sleep apnea, neg COPD, former smoker,    breath sounds clear to auscultation- rhonchi (-) wheezing      Cardiovascular Exercise Tolerance: Good (-) hypertension(-) CAD and (-) Past MI  Rhythm:Regular Rate:Normal - Systolic murmurs and - Diastolic murmurs    Neuro/Psych  Headaches, Depression    GI/Hepatic Neg liver ROS, hiatal hernia, PUD, GERD  ,  Endo/Other  diabetes  Renal/GU negative Renal ROS     Musculoskeletal negative musculoskeletal ROS (+)   Abdominal (+) - obese,   Peds  Hematology negative hematology ROS (+)   Anesthesia Other Findings Past Medical History: No date: Cardiomyopathy     Comment: nonischemic No date: Diabetes mellitus     Comment: pt states she is not diabetic No date: Erosive esophagitis     Comment: LA Classification Grade B No date: GERD (gastroesophageal reflux disease) No date: Hiatal hernia No date: Hyperlipidemia No date: Melanoma ([place])     Comment: basil cell, pt reports no melanoma No date: Migraine No date: Osteopenia 10/2011: Sessile colonic polyp     Comment: adenoma   Reproductive/Obstetrics                             Anesthesia Physical Anesthesia Plan  ASA: III  Anesthesia Plan: MAC   Post-op Pain Management:    Induction: Intravenous  Airway Management Planned: Natural Airway  Additional Equipment:   Intra-op Plan:   Post-operative Plan:   Informed Consent: I have reviewed the patients History and Physical, chart, labs and discussed the procedure  including the risks, benefits and alternatives for the proposed anesthesia with the patient or authorized representative who has indicated his/her understanding and acceptance.     Plan Discussed with: CRNA and Anesthesiologist  Anesthesia Plan Comments:         Anesthesia Quick Evaluation

## 2016-03-18 NOTE — Anesthesia Postprocedure Evaluation (Signed)
Anesthesia Post Note  Patient: Ismael Treptow Petrosky  Procedure(s) Performed: Procedure(s) (LRB): CATARACT EXTRACTION PHACO AND INTRAOCULAR LENS PLACEMENT (IOC) (Right)  Patient location during evaluation: PACU Anesthesia Type: MAC Level of consciousness: awake and alert and oriented Pain management: pain level controlled Vital Signs Assessment: post-procedure vital signs reviewed and stable Respiratory status: spontaneous breathing, nonlabored ventilation, respiratory function stable and patient connected to nasal cannula oxygen Cardiovascular status: stable and blood pressure returned to baseline Anesthetic complications: no     Last Vitals:  Vitals:   03/18/16 0950 03/18/16 1000  BP: 115/62 (!) 123/55  Pulse: 65 62  Resp: 16   Temp: (!) 35.8 C     Last Pain:  Vitals:   03/18/16 0950  TempSrc: Temporal                 Linton Stolp

## 2016-03-19 ENCOUNTER — Encounter: Payer: Self-pay | Admitting: Ophthalmology

## 2016-03-20 ENCOUNTER — Encounter: Payer: Self-pay | Admitting: Family Medicine

## 2016-03-20 ENCOUNTER — Ambulatory Visit (INDEPENDENT_AMBULATORY_CARE_PROVIDER_SITE_OTHER): Payer: Commercial Managed Care - HMO | Admitting: Family Medicine

## 2016-03-20 VITALS — BP 122/64 | HR 62 | Temp 98.1°F | Wt 137.2 lb

## 2016-03-20 DIAGNOSIS — F329 Major depressive disorder, single episode, unspecified: Secondary | ICD-10-CM

## 2016-03-20 DIAGNOSIS — K2100 Gastro-esophageal reflux disease with esophagitis, without bleeding: Secondary | ICD-10-CM

## 2016-03-20 DIAGNOSIS — F32A Depression, unspecified: Secondary | ICD-10-CM

## 2016-03-20 DIAGNOSIS — F4321 Adjustment disorder with depressed mood: Secondary | ICD-10-CM

## 2016-03-20 DIAGNOSIS — G4700 Insomnia, unspecified: Secondary | ICD-10-CM | POA: Diagnosis not present

## 2016-03-20 DIAGNOSIS — F4320 Adjustment disorder, unspecified: Secondary | ICD-10-CM

## 2016-03-20 MED ORDER — ZOLPIDEM TARTRATE 10 MG PO TABS: 10.0000 mg | ORAL_TABLET | Freq: Every evening | ORAL | 0 refills | Status: DC | PRN

## 2016-03-20 NOTE — Patient Instructions (Signed)
Great to see you!  Happy New Year! 

## 2016-03-20 NOTE — Progress Notes (Signed)
Pre visit review using our clinic review tool, if applicable. No additional management support is needed unless otherwise documented below in the visit note. 

## 2016-03-20 NOTE — Assessment & Plan Note (Addendum)
>  25 minutes spent in face to face time with patient, >50% spent in counselling or coordination of care discussing depression, GERD and insomnia. She will continue current rxs.

## 2016-03-20 NOTE — Assessment & Plan Note (Signed)
Stable on current rxs. No changes made. 

## 2016-03-20 NOTE — Progress Notes (Signed)
Subjective:   Patient ID: [name], female    DOB: 1945/07/03, 71 y.o.   MRN: [id number]  [name] is a pleasant 71 y.o. year old female who presents to clinic today with Follow-up and Medication Refill  on 03/20/2016  HPI:  Doing well.  Had cataract extraction two days ago.  GERD- h/o erosive esophagitis, followed by Dr. Silvio Pate. Remains on PPI and pepcid AC for breakthrough symptoms.   Insomnia, prolonged grief and depression- has been doing well with lexapro since her husbands death and would like to continue this. She did try trazodone shortly after his unexpected death but it made her feel groggy.  Only Lorrin Mais has been effective without side effects the following day. Still going back and forth to [place] quite a bit to see her children and grandchildren.  Current Outpatient Prescriptions on File Prior to Visit  Medication Sig Dispense Refill  . amLODipine (NORVASC) 2.5 MG tablet TAKE 1 TABLET BY MOUTH DAILY 90 tablet 0  . carvedilol (COREG) 12.5 MG tablet TAKE ONE TABLET TWICE DAILY WITH A MEAL 180 tablet 0  . enalapril (VASOTEC) 10 MG tablet Take 1 tablet (10 mg total) by mouth daily. 90 tablet 3  . escitalopram (LEXAPRO) 20 MG tablet TAKE ONE TABLET EVERY DAY 30 tablet 3  . Multiple Vitamin (MULTIVITAMIN) capsule Take 1 capsule by mouth daily.      . pantoprazole (PROTONIX) 40 MG tablet Take 1 tablet (40 mg total) by mouth 2 (two) times daily. 60 tablet 11  . promethazine (PHENERGAN) 12.5 MG tablet TAKE ONE TABLET EVERY EIGHT HOURS AS NEEDED FOR NAUSEA / VOMITING 20 tablet 0  . simvastatin (ZOCOR) 20 MG tablet Take 1 tablet (20 mg total) by mouth at bedtime. Please call and schedule a one year follow up appointment 90 tablet 0   No current facility-administered medications on file prior to visit.     Allergies  Allergen Reactions  . Penicillins Anaphylaxis and Hives    Has patient had a PCN reaction causing immediate rash, facial/tongue/throat swelling, SOB or  lightheadedness with hypotension: {Yes Has patient had a PCN reaction causing severe rash involving mucus membranes or skin necrosis: {no Has patient had a PCN reaction that required hospitalization {yes Has patient had a PCN reaction occurring within the last 10 years: {no If all of the above answers are "NO", then may proceed with Cephalosporin use.  . Lansoprazole     REACTION: headaches  . Sulfa Antibiotics Hives  . Ceclor [Cefaclor] Itching and Rash  . Ceftin [Cefuroxime Axetil] Rash  . Clindamycin/Lincomycin Itching and Rash  . Latex Itching and Rash  . Lincomycin Hcl Itching and Rash  . Septra [Sulfamethoxazole-Trimethoprim] Rash    Past Medical History:  Diagnosis Date  . Cardiomyopathy    nonischemic  . Diabetes mellitus    pt states she is not diabetic  . Erosive esophagitis    LA Classification Grade B  . GERD (gastroesophageal reflux disease)   . Hiatal hernia   . Hyperlipidemia   . Melanoma ([place])    basil cell, pt reports no melanoma  . Migraine   . Osteopenia   . Sessile colonic polyp 10/2011   adenoma    Past Surgical History:  Procedure Laterality Date  . APPENDECTOMY    . CATARACT EXTRACTION W/PHACO Right 03/18/2016   Procedure: CATARACT EXTRACTION PHACO AND INTRAOCULAR LENS PLACEMENT (IOC);  Surgeon: Birder [name], MD;  Location: [facility] ORS;  Service: Ophthalmology;  Laterality: Right;  [place]  45.3 AP% 22.2 CDE 10.07 Fluid pack lot # [id number]  . TUBAL LIGATION    . VAGINAL HYSTERECTOMY      Family History  Problem Relation Age of Onset  . Alzheimer's disease Mother   . Heart disease Father   . Arthritis/Rheumatoid Sister   . Colon cancer Neg Hx   . Stomach cancer Neg Hx   . Rectal cancer Neg Hx   . Esophageal cancer Neg Hx     Social History   Social History  . Marital status: Married    Spouse name: N/A  . Number of children: 3  . Years of education: N/A   Occupational History  . Housewife    Social History Main Topics  . Smoking  status: Former Smoker    Quit date: 06/03/1981  . Smokeless tobacco: Never Used  . Alcohol use 1.8 oz/week    3 Glasses of wine per week     Comment: Wine on occasional  . Drug use: No  . Sexual activity: Not on file   Other Topics Concern  . Not on file   Social History Narrative   Lives alone in two-story home.     Her husband passed away unexpectedly.  They have three children- one is a pediatrician.   Desires CPR.   The PMH, PSH, Social History, Family History, Medications, and allergies have been reviewed in [facility], and have been updated if relevant.     Review of Systems  Constitutional: Negative.   HENT: Negative.   Musculoskeletal: Negative.   Hematological: Negative.   Psychiatric/Behavioral: Negative.   All other systems reviewed and are negative.      Objective:    BP 122/64   Pulse 62   Temp 98.1 F (36.7 C) (Oral)   Wt 137 lb 4 oz (62.3 kg)   SpO2 98%   BMI 25.10 kg/m    Physical Exam  Constitutional: She is oriented to person, place, and time. She appears well-developed and well-nourished. No distress.  HENT:  Head: Normocephalic and atraumatic.  Eyes: Conjunctivae are normal.  Cardiovascular: Normal rate and regular rhythm.   Pulmonary/Chest: Effort normal and breath sounds normal.  Musculoskeletal: Normal range of motion.  Neurological: She is alert and oriented to person, place, and time. No cranial nerve deficit.  Skin: Skin is warm and dry. She is not diaphoretic.  Psychiatric: She has a normal mood and affect. Her behavior is normal. Judgment normal.  Nursing note and vitals reviewed.        Assessment & Plan:   Grief reaction  Insomnia, unspecified type  Depression, unspecified depression type No Follow-up on file.

## 2016-03-21 ENCOUNTER — Other Ambulatory Visit: Payer: Self-pay | Admitting: *Deleted

## 2016-03-21 ENCOUNTER — Ambulatory Visit: Payer: Commercial Managed Care - HMO | Admitting: Internal Medicine

## 2016-03-21 MED ORDER — SIMVASTATIN 20 MG PO TABS: 20.0000 mg | ORAL_TABLET | Freq: Every day | ORAL | 0 refills | Status: DC

## 2016-03-25 DIAGNOSIS — Z85828 Personal history of other malignant neoplasm of skin: Secondary | ICD-10-CM | POA: Diagnosis not present

## 2016-03-25 DIAGNOSIS — L821 Other seborrheic keratosis: Secondary | ICD-10-CM | POA: Diagnosis not present

## 2016-03-25 DIAGNOSIS — D0471 Carcinoma in situ of skin of right lower limb, including hip: Secondary | ICD-10-CM | POA: Diagnosis not present

## 2016-03-25 DIAGNOSIS — D225 Melanocytic nevi of trunk: Secondary | ICD-10-CM | POA: Diagnosis not present

## 2016-03-25 DIAGNOSIS — C44612 Basal cell carcinoma of skin of right upper limb, including shoulder: Secondary | ICD-10-CM | POA: Diagnosis not present

## 2016-03-25 DIAGNOSIS — L570 Actinic keratosis: Secondary | ICD-10-CM | POA: Diagnosis not present

## 2016-03-25 DIAGNOSIS — D485 Neoplasm of uncertain behavior of skin: Secondary | ICD-10-CM | POA: Diagnosis not present

## 2016-04-04 DIAGNOSIS — H2512 Age-related nuclear cataract, left eye: Secondary | ICD-10-CM | POA: Diagnosis not present

## 2016-04-07 ENCOUNTER — Encounter: Payer: Self-pay | Admitting: *Deleted

## 2016-04-08 ENCOUNTER — Ambulatory Visit: Payer: Medicare HMO | Admitting: Anesthesiology

## 2016-04-08 ENCOUNTER — Encounter: Payer: Self-pay | Admitting: *Deleted

## 2016-04-08 ENCOUNTER — Ambulatory Visit
Admission: RE | Admit: 2016-04-08 | Discharge: 2016-04-08 | Disposition: A | Discharge status: Home / Self Care | Payer: Medicare HMO | Source: Ambulatory Visit | Attending: Ophthalmology | Admitting: Ophthalmology

## 2016-04-08 ENCOUNTER — Encounter
Admission: RE | Disposition: A | Discharge status: Home / Self Care | Payer: Self-pay | Source: Ambulatory Visit | Attending: Ophthalmology

## 2016-04-08 DIAGNOSIS — Z79899 Other long term (current) drug therapy: Secondary | ICD-10-CM | POA: Diagnosis not present

## 2016-04-08 DIAGNOSIS — E785 Hyperlipidemia, unspecified: Secondary | ICD-10-CM | POA: Diagnosis not present

## 2016-04-08 DIAGNOSIS — Z87891 Personal history of nicotine dependence: Secondary | ICD-10-CM | POA: Insufficient documentation

## 2016-04-08 DIAGNOSIS — K219 Gastro-esophageal reflux disease without esophagitis: Secondary | ICD-10-CM | POA: Insufficient documentation

## 2016-04-08 DIAGNOSIS — Z85828 Personal history of other malignant neoplasm of skin: Secondary | ICD-10-CM | POA: Insufficient documentation

## 2016-04-08 DIAGNOSIS — H2512 Age-related nuclear cataract, left eye: Secondary | ICD-10-CM | POA: Diagnosis not present

## 2016-04-08 DIAGNOSIS — F329 Major depressive disorder, single episode, unspecified: Secondary | ICD-10-CM | POA: Diagnosis not present

## 2016-04-08 DIAGNOSIS — I429 Cardiomyopathy, unspecified: Secondary | ICD-10-CM | POA: Diagnosis not present

## 2016-04-08 HISTORY — DX: Other specified postprocedural states: Z98.890

## 2016-04-08 HISTORY — DX: Other complications of anesthesia, initial encounter: T88.59XA

## 2016-04-08 HISTORY — PX: CATARACT EXTRACTION W/PHACO: SHX586

## 2016-04-08 HISTORY — DX: Nausea with vomiting, unspecified: R11.2

## 2016-04-08 HISTORY — DX: Adverse effect of unspecified anesthetic, initial encounter: T41.45XA

## 2016-04-08 SURGERY — PHACOEMULSIFICATION, CATARACT, WITH IOL INSERTION
Anesthesia: Monitor Anesthesia Care | Site: Eye | Laterality: Left | Wound class: Clean

## 2016-04-08 MED ORDER — MIDAZOLAM HCL 2 MG/2ML IJ SOLN
INTRAMUSCULAR | Status: AC
  Filled 2016-04-08: qty 2

## 2016-04-08 MED ORDER — ARMC OPHTHALMIC DILATING DROPS
1.0000 "application " | OPHTHALMIC | Status: AC
  Administered 2016-04-08 (×3): 1 via OPHTHALMIC

## 2016-04-08 MED ORDER — MOXIFLOXACIN HCL 0.5 % OP SOLN
OPHTHALMIC | Status: DC | PRN
  Administered 2016-04-08: .2 mL via OPHTHALMIC

## 2016-04-08 MED ORDER — NA CHONDROIT SULF-NA HYALURON 40-17 MG/ML IO SOLN
INTRAOCULAR | Status: DC | PRN
  Administered 2016-04-08: 1 mL via INTRAOCULAR

## 2016-04-08 MED ORDER — ACETYLCHOLINE CHLORIDE 20 MG IO SOLR
INTRAOCULAR | Status: AC
  Filled 2016-04-08: qty 1

## 2016-04-08 MED ORDER — ACETYLCHOLINE CHLORIDE 20 MG IO SOLR
INTRAOCULAR | Status: DC | PRN
  Administered 2016-04-08: 10 mg via INTRAOCULAR

## 2016-04-08 MED ORDER — FENTANYL CITRATE (PF) 100 MCG/2ML IJ SOLN
INTRAMUSCULAR | Status: DC | PRN
  Administered 2016-04-08: 50 ug via INTRAVENOUS

## 2016-04-08 MED ORDER — NA CHONDROIT SULF-NA HYALURON 40-17 MG/ML IO SOLN
INTRAOCULAR | Status: AC
  Filled 2016-04-08: qty 1

## 2016-04-08 MED ORDER — FENTANYL CITRATE (PF) 100 MCG/2ML IJ SOLN
INTRAMUSCULAR | Status: AC
  Filled 2016-04-08: qty 2

## 2016-04-08 MED ORDER — EPINEPHRINE PF 1 MG/ML IJ SOLN
INTRAMUSCULAR | Status: DC | PRN
  Administered 2016-04-08: 1 mL via OPHTHALMIC

## 2016-04-08 MED ORDER — SODIUM CHLORIDE 0.9 % IV SOLN
INTRAVENOUS | Status: DC
  Administered 2016-04-08: 07:00:00 via INTRAVENOUS

## 2016-04-08 MED ORDER — MOXIFLOXACIN HCL 0.5 % OP SOLN: 1.0000 [drp] | OPHTHALMIC | Status: DC | PRN

## 2016-04-08 MED ORDER — ARMC OPHTHALMIC DILATING DROPS
OPHTHALMIC | Status: AC
  Filled 2016-04-08: qty 0.4

## 2016-04-08 MED ORDER — LIDOCAINE HCL (PF) 4 % IJ SOLN
INTRAMUSCULAR | Status: DC | PRN
  Administered 2016-04-08: 2.25 mL via OPHTHALMIC

## 2016-04-08 MED ORDER — POVIDONE-IODINE 5 % OP SOLN
OPHTHALMIC | Status: AC
  Filled 2016-04-08: qty 30

## 2016-04-08 MED ORDER — MIDAZOLAM HCL 2 MG/2ML IJ SOLN
INTRAMUSCULAR | Status: DC | PRN
  Administered 2016-04-08 (×2): 1 mg via INTRAVENOUS

## 2016-04-08 MED ORDER — LIDOCAINE HCL (PF) 4 % IJ SOLN
INTRAMUSCULAR | Status: AC
  Filled 2016-04-08: qty 5

## 2016-04-08 MED ORDER — MOXIFLOXACIN HCL 0.5 % OP SOLN
OPHTHALMIC | Status: AC
  Filled 2016-04-08: qty 3

## 2016-04-08 SURGICAL SUPPLY — 21 items
CANNULA ANT/CHMB 27GA (MISCELLANEOUS) ×2 IMPLANT
CUP MEDICINE 2OZ PLAST GRAD ST (MISCELLANEOUS) ×2 IMPLANT
GLOVE BIO SURGEON STRL SZ8 (GLOVE) ×2 IMPLANT
GLOVE BIOGEL M 6.5 STRL (GLOVE) ×2 IMPLANT
GLOVE SURG LX 8.0 MICRO (GLOVE) ×1
GLOVE SURG LX STRL 8.0 MICRO (GLOVE) ×1 IMPLANT
GOWN STRL REUS W/ TWL LRG LVL3 (GOWN DISPOSABLE) ×2 IMPLANT
GOWN STRL REUS W/TWL LRG LVL3 (GOWN DISPOSABLE) ×2
LENS IOL TECNIS ITEC 19.5 (Intraocular Lens) ×2 IMPLANT
PACK CATARACT (MISCELLANEOUS) ×2 IMPLANT
PACK CATARACT BRASINGTON LX (MISCELLANEOUS) ×2 IMPLANT
PACK EYE AFTER SURG (MISCELLANEOUS) ×2 IMPLANT
SOL BSS BAG (MISCELLANEOUS) ×2
SOL PREP PVP 2OZ (MISCELLANEOUS) ×2
SOLUTION BSS BAG (MISCELLANEOUS) ×1 IMPLANT
SOLUTION PREP PVP 2OZ (MISCELLANEOUS) ×1 IMPLANT
SYR 3ML LL SCALE MARK (SYRINGE) ×2 IMPLANT
SYR 5ML LL (SYRINGE) ×2 IMPLANT
SYR TB 1ML 27GX1/2 LL (SYRINGE) ×2 IMPLANT
WATER STERILE IRR 250ML POUR (IV SOLUTION) ×2 IMPLANT
WIPE NON LINTING 3.25X3.25 (MISCELLANEOUS) ×2 IMPLANT

## 2016-04-08 NOTE — Op Note (Signed)
PREOPERATIVE DIAGNOSIS:  Nuclear sclerotic cataract of the left eye.   POSTOPERATIVE DIAGNOSIS:  Nuclear sclerotic cataract of the left eye.   OPERATIVE PROCEDURE: Procedure(s): CATARACT EXTRACTION PHACO AND INTRAOCULAR LENS PLACEMENT (IOC)   SURGEON:  Birder [name], MD.   ANESTHESIA:  Anesthesiologist: Andria Frames, MD CRNA: Hedda Slade, CRNA  1.      Managed anesthesia care. 2.     0.37ml of Shugarcaine was instilled following the paracentesis   COMPLICATIONS:  None.   TECHNIQUE:   Stop and chop   DESCRIPTION OF PROCEDURE:  The patient was examined and consented in the preoperative holding area where the aforementioned topical anesthesia was applied to the left eye and then brought back to the Operating Room where the left eye was prepped and draped in the usual sterile ophthalmic fashion and a lid speculum was placed. A paracentesis was created with the side port blade and the anterior chamber was filled with viscoelastic. A near clear corneal incision was performed with the steel keratome. A continuous curvilinear capsulorrhexis was performed with a cystotome followed by the capsulorrhexis forceps. Hydrodissection and hydrodelineation were carried out with BSS on a blunt cannula. The lens was removed in a stop and chop  technique and the remaining cortical material was removed with the irrigation-aspiration handpiece. The capsular bag was inflated with viscoelastic and the Technis [id number] lens was placed in the capsular bag without complication. The remaining viscoelastic was removed from the eye with the irrigation-aspiration handpiece. The wounds were hydrated. The anterior chamber was flushed with Miostat and the eye was inflated to physiologic pressure. 0.87ml Vigamox was placed in the anterior chamber. The wounds were found to be water tight. The eye was dressed with Vigamox. The patient was given protective glasses to wear throughout the day and a shield with which to sleep  tonight. The patient was also given drops with which to begin a drop regimen today and will follow-up with me in one day.  Implant Name Type Inv. Item Serial No. Manufacturer Lot No. LRB No. Used  LENS IOL DIOP 19.5 - [id number] Intraocular Lens LENS IOL DIOP 19.5 [id number] AMO   Left 1    Procedure(s) with comments: CATARACT EXTRACTION PHACO AND INTRAOCULAR LENS PLACEMENT (IOC) (Left) - [place]  AP% CDE Fluid pack lot # [id number]  Electronically signed: [place] 04/08/2016 7:54 AM

## 2016-04-08 NOTE — Transfer of Care (Signed)
Immediate Anesthesia Transfer of Care Note  Patient: [name]  Procedure(s) Performed: Procedure(s) with comments: CATARACT EXTRACTION PHACO AND INTRAOCULAR LENS PLACEMENT (IOC) (Left) - [place]  AP% CDE Fluid pack lot # [id number]  Patient Location: PACU  Anesthesia Type:MAC  Level of Consciousness: awake, alert  and oriented  Airway & Oxygen Therapy: Patient Spontanous Breathing  Post-op Assessment: Report given to RN and Post -op Vital signs reviewed and stable  Post vital signs: Reviewed and stable  Last Vitals:  Vitals:   04/08/16 0617 04/08/16 0806  BP: 128/69 123/65  Pulse: 80 64  Resp: 16 12  Temp: 36.7 C (!) 35.8 C    Last Pain:  Vitals:   04/08/16 0806  TempSrc: Temporal         Complications: No apparent anesthesia complications

## 2016-04-08 NOTE — Anesthesia Preprocedure Evaluation (Signed)
Anesthesia Evaluation  Patient identified by MRN, date of birth, ID band Patient awake    Reviewed: Allergy & Precautions, NPO status , Patient's Chart, lab work & pertinent test results  History of Anesthesia Complications (+) PONV and history of anesthetic complications  Airway Mallampati: II  TM Distance: >3 FB Neck ROM: Full    Dental no notable dental hx.    Pulmonary neg sleep apnea, neg COPD, former smoker,    breath sounds clear to auscultation- rhonchi (-) wheezing      Cardiovascular Exercise Tolerance: Good (-) hypertension(-) CAD and (-) Past MI  Rhythm:Regular Rate:Normal - Systolic murmurs and - Diastolic murmurs    Neuro/Psych  Headaches, PSYCHIATRIC DISORDERS Depression  Neuromuscular disease    GI/Hepatic Neg liver ROS, hiatal hernia, PUD, GERD  ,  Endo/Other  diabetes  Renal/GU negative Renal ROS     Musculoskeletal negative musculoskeletal ROS (+)   Abdominal (+) - obese,   Peds  Hematology negative hematology ROS (+)   Anesthesia Other Findings Past Medical History: No date: Cardiomyopathy     Comment: nonischemic No date: Diabetes mellitus     Comment: pt states she is not diabetic No date: Erosive esophagitis     Comment: LA Classification Grade B No date: GERD (gastroesophageal reflux disease) No date: Hiatal hernia No date: Hyperlipidemia No date: Melanoma ([place])     Comment: basil cell, pt reports no melanoma No date: Migraine No date: Osteopenia 10/2011: Sessile colonic polyp     Comment: adenoma   Reproductive/Obstetrics                             Anesthesia Physical  Anesthesia Plan  ASA: III  Anesthesia Plan: MAC   Post-op Pain Management:    Induction: Intravenous  Airway Management Planned: Natural Airway  Additional Equipment:   Intra-op Plan:   Post-operative Plan:   Informed Consent: I have reviewed the patients History and  Physical, chart, labs and discussed the procedure including the risks, benefits and alternatives for the proposed anesthesia with the patient or authorized representative who has indicated his/her understanding and acceptance.     Plan Discussed with: CRNA and Anesthesiologist  Anesthesia Plan Comments:         Anesthesia Quick Evaluation

## 2016-04-08 NOTE — Anesthesia Postprocedure Evaluation (Signed)
Anesthesia Post Note  Patient: [name]  Procedure(s) Performed: Procedure(s) (LRB): CATARACT EXTRACTION PHACO AND INTRAOCULAR LENS PLACEMENT (IOC) (Left)  Patient location during evaluation: PACU Anesthesia Type: MAC Level of consciousness: awake, awake and alert and oriented Pain management: pain level controlled Vital Signs Assessment: post-procedure vital signs reviewed and stable Respiratory status: spontaneous breathing Cardiovascular status: blood pressure returned to baseline Postop Assessment: no signs of nausea or vomiting Anesthetic complications: no     Last Vitals:  Vitals:   04/08/16 0759 04/08/16 0806  BP: 123/65 123/65  Pulse: 62 64  Resp: 18 12  Temp: (!) 35.8 C (!) 35.8 C    Last Pain:  Vitals:   04/08/16 0806  TempSrc: Temporal                 Zakira Ressel Lorenza Chick

## 2016-04-08 NOTE — H&P (Signed)
All labs reviewed. Abnormal studies sent to patients PCP when indicated.  Previous H&P reviewed, patient examined, there are NO CHANGES.  [name] LOUIS1/30/20187:18 AM

## 2016-04-08 NOTE — Anesthesia Procedure Notes (Signed)
Procedure Name: MAC Date/Time: 04/08/2016 7:35 AM Performed by: Hedda Slade Pre-anesthesia Checklist: Patient identified, Emergency Drugs available, Suction available and Patient being monitored Oxygen Delivery Method: Nasal cannula

## 2016-04-08 NOTE — Discharge Instructions (Signed)
Eye Surgery Discharge Instructions  Expect mild scratchy sensation or mild soreness. DO NOT RUB YOUR EYE!  The day of surgery:  Minimal physical activity, but bed rest is not required  No reading, computer work, or close hand work  No bending, lifting, or straining.  May watch TV  For 24 hours:  No driving, legal decisions, or alcoholic beverages  Safety precautions  Eat anything you prefer: It is better to start with liquids, then soup then solid foods.  _____ Eye patch should be worn until postoperative exam tomorrow.  ____ Solar shield eyeglasses should be worn for comfort in the sunlight/patch while sleeping  Resume all regular medications including aspirin or Coumadin if these were discontinued prior to surgery. You may shower, bathe, shave, or wash your hair. Tylenol may be taken for mild discomfort.  Call your doctor if you experience significant pain, nausea, or vomiting, fever > 101 or other signs of infection. [phone] or [phone] Specific instructions:  Follow-up Information    [name],Sarah LOUIS, MD Follow up.   Specialty:  Ophthalmology Why:  04-09-16 at 9:40 Contact information: [address] [place] [address] [phone]          Eye Surgery Discharge Instructions  Expect mild scratchy sensation or mild soreness. DO NOT RUB YOUR EYE!  The day of surgery:  Minimal physical activity, but bed rest is not required  No reading, computer work, or close hand work  No bending, lifting, or straining.  May watch TV  For 24 hours:  No driving, legal decisions, or alcoholic beverages  Safety precautions  Eat anything you prefer: It is better to start with liquids, then soup then solid foods.  _____ Eye patch should be worn until postoperative exam tomorrow.  ____ Solar shield eyeglasses should be worn for comfort in the sunlight/patch while sleeping  Resume all regular medications including aspirin or Coumadin if these  were discontinued prior to surgery. You may shower, bathe, shave, or wash your hair. Tylenol may be taken for mild discomfort.  Call your doctor if you experience significant pain, nausea, or vomiting, fever > 101 or other signs of infection. [phone] or [phone] Specific instructions:  Follow-up Information    [name],Sarah LOUIS, MD Follow up.   Specialty:  Ophthalmology Why:  04-09-16 at 9:40 Contact information: [place] [address] [phone]

## 2016-04-08 NOTE — Anesthesia Post-op Follow-up Note (Cosign Needed)
Anesthesia QCDR form completed.        

## 2016-04-09 DIAGNOSIS — H2512 Age-related nuclear cataract, left eye: Secondary | ICD-10-CM | POA: Diagnosis not present

## 2016-04-14 ENCOUNTER — Ambulatory Visit (INDEPENDENT_AMBULATORY_CARE_PROVIDER_SITE_OTHER): Payer: Medicare HMO | Admitting: Internal Medicine

## 2016-04-14 ENCOUNTER — Encounter: Payer: Self-pay | Admitting: Internal Medicine

## 2016-04-14 VITALS — BP 110/60 | HR 69 | Ht 62.0 in | Wt 137.8 lb

## 2016-04-14 DIAGNOSIS — I429 Cardiomyopathy, unspecified: Secondary | ICD-10-CM

## 2016-04-14 MED ORDER — AMLODIPINE BESYLATE 2.5 MG PO TABS: 2.5000 mg | ORAL_TABLET | Freq: Every day | ORAL | 3 refills | Status: DC

## 2016-04-14 MED ORDER — CARVEDILOL 12.5 MG PO TABS: ORAL_TABLET | ORAL | 3 refills | Status: DC

## 2016-04-14 MED ORDER — ENALAPRIL MALEATE 10 MG PO TABS: 10.0000 mg | ORAL_TABLET | Freq: Every day | ORAL | 3 refills | Status: DC

## 2016-04-14 MED ORDER — SIMVASTATIN 20 MG PO TABS: 20.0000 mg | ORAL_TABLET | Freq: Every day | ORAL | 3 refills | Status: DC

## 2016-04-14 NOTE — Patient Instructions (Addendum)
Your physician recommends that you continue on your current medications as directed. Please refer to the Current Medication list given to you today. Your physician wants you to follow-up in: 1 year with Dr. Ross.  You will receive a reminder letter in the mail two months in advance. If you don't receive a letter, please call our office to schedule the follow-up appointment.  

## 2016-04-14 NOTE — Progress Notes (Signed)
Cardiology Office Note   Date:  04/14/2016   ID:  [name], DOB March 05, 1946, MRN [id number]  PCP:  Arnette Norris, MD  Cardiologist:   Dorris Carnes, MD   F/U of NICM, LBBB   History of Present Illness: [name] is a 71 y.o. female with a history of LBBB< NICM (LVEF 50 to 55% 2011) CP  Normal myovue in 2014.  Also history of signif GERD I saw her in Jan 2017 Since seen she says breathing is OK  Last month has been difficult for her  Still recovering for cataract surgery    Current Meds  Medication Sig  . acetaminophen (TYLENOL) 500 MG tablet Take 1,000 mg by mouth every 6 (six) hours as needed for moderate pain or headache.  Marland Kitchen amLODipine (NORVASC) 2.5 MG tablet TAKE 1 TABLET BY MOUTH DAILY  . carvedilol (COREG) 12.5 MG tablet TAKE ONE TABLET TWICE DAILY WITH A MEAL  . Cholecalciferol (VITAMIN D3) 2000 units capsule Take 2,000 Units by mouth daily.  . enalapril (VASOTEC) 10 MG tablet Take 1 tablet (10 mg total) by mouth daily.  Marland Kitchen escitalopram (LEXAPRO) 20 MG tablet TAKE ONE TABLET EVERY DAY  . metroNIDAZOLE (METROCREAM) 0.75 % cream Apply 1 application topically at bedtime.  . Multiple Vitamin (MULTIVITAMIN) capsule Take 1 capsule by mouth daily.    . pantoprazole (PROTONIX) 40 MG tablet Take 1 tablet (40 mg total) by mouth 2 (two) times daily.  . promethazine (PHENERGAN) 12.5 MG tablet TAKE ONE TABLET EVERY EIGHT HOURS AS NEEDED FOR NAUSEA / VOMITING  . simvastatin (ZOCOR) 20 MG tablet Take 1 tablet (20 mg total) by mouth at bedtime.  . traMADol (ULTRAM) 50 MG tablet Take 50 mg by mouth daily as needed for moderate pain.  Marland Kitchen zolpidem (AMBIEN) 10 MG tablet Take 1 tablet (10 mg total) by mouth at bedtime as needed for sleep.     Allergies:   Penicillins; Lansoprazole; Sulfa antibiotics; Ceclor [cefaclor]; Ceftin [cefuroxime axetil]; Clindamycin/lincomycin; Latex; Lincomycin hcl; and Septra [sulfamethoxazole-trimethoprim]   Past Medical History:  Diagnosis Date  .  Cardiomyopathy    nonischemic  . Complication of anesthesia   . Erosive esophagitis    LA Classification Grade B  . GERD (gastroesophageal reflux disease)   . Hiatal hernia   . Hyperlipidemia   . Melanoma ([place])    basil cell, pt reports no melanoma  . Migraine   . Osteopenia   . PONV (postoperative nausea and vomiting)   . Sessile colonic polyp 10/2011   adenoma    Past Surgical History:  Procedure Laterality Date  . APPENDECTOMY    . CARDIAC CATHETERIZATION    . CATARACT EXTRACTION W/PHACO Right 03/18/2016   Procedure: CATARACT EXTRACTION PHACO AND INTRAOCULAR LENS PLACEMENT (IOC);  Surgeon: Birder [name], MD;  Location: [facility] ORS;  Service: Ophthalmology;  Laterality: Right;  [place] 45.3 AP% 22.2 CDE 10.07 Fluid pack lot # [id number]  . CATARACT EXTRACTION W/PHACO Left 04/08/2016   Procedure: CATARACT EXTRACTION PHACO AND INTRAOCULAR LENS PLACEMENT (IOC);  Surgeon: Birder [name], MD;  Location: [facility] ORS;  Service: Ophthalmology;  Laterality: Left;  [place] 00:41 AP% 15.0 CDE 6.27 Fluid pack lot # [id number]  . TUBAL LIGATION    . VAGINAL HYSTERECTOMY       Social History:  The patient  reports that she quit smoking about 34 years ago. She has never used smokeless tobacco. She reports that she drinks about 1.8 oz of alcohol per week . She reports that she  does not use drugs.   Family History:  The patient's family history includes Alzheimer's disease in her mother; Arthritis/Rheumatoid in her sister; Heart disease in her father.    ROS:  Please see the history of present illness. All other systems are reviewed and  Negative to the above problem except as noted.    PHYSICAL EXAM: VS:  BP 110/60   Pulse 69   Ht 5\' 2"  (1.575 m)   Wt 137 lb 12.8 oz (62.5 kg)   SpO2 98%   BMI 25.20 kg/m   GEN: Well nourished, well developed, in no acute distress  HEENT: normal  Neck: no JVD, carotid bruits, or masses Cardiac: RRR; no murmurs, rubs, or gallops,no edema  Respiratory:  clear to  auscultation bilaterally, normal work of breathing GI: soft, nontender, nondistended, + BS  No hepatomegaly  MS: no deformity Moving all extremities   Skin: warm and dry, no rash Neuro:  Strength and sensation are intact Psych: euthymic mood, full affect   EKG:  EKG is not ordered today.   Lipid Panel    Component Value Date/Time   CHOL 169 03/22/2015 1000   TRIG 265 (H) 03/22/2015 1000   HDL 48 03/22/2015 1000   CHOLHDL 3.5 03/22/2015 1000   VLDL 53 (H) 03/22/2015 1000   LDLCALC 68 03/22/2015 1000   LDLDIRECT 89.3 04/03/2011 1009      Wt Readings from Last 3 Encounters:  04/14/16 137 lb 12.8 oz (62.5 kg)  04/08/16 134 lb (60.8 kg)  03/20/16 137 lb 4 oz (62.3 kg)      ASSESSMENT AND PLAN:  1  NICM   LVEFimproved by echo  Volume good  No change in meds  2  Dyslipidemia  Will get labs in May with primary MD   Encouraged her to stay active  Consider eval by [facility] [place] for backproblems   I will see next winter    Current medicines are reviewed at length with the patient today.  The patient does not have concerns regarding medicines.  Signed, Dorris Carnes, MD  04/14/2016 2:04 PM    [facility] [place], [place], [place]  [address] Phone: [phone]; Fax: [phone]

## 2016-04-17 DIAGNOSIS — D0471 Carcinoma in situ of skin of right lower limb, including hip: Secondary | ICD-10-CM | POA: Diagnosis not present

## 2016-04-17 DIAGNOSIS — C44612 Basal cell carcinoma of skin of right upper limb, including shoulder: Secondary | ICD-10-CM | POA: Diagnosis not present

## 2016-04-29 DIAGNOSIS — Z0100 Encounter for examination of eyes and vision without abnormal findings: Secondary | ICD-10-CM | POA: Diagnosis not present

## 2016-05-21 DIAGNOSIS — Z961 Presence of intraocular lens: Secondary | ICD-10-CM | POA: Diagnosis not present

## 2016-05-22 ENCOUNTER — Other Ambulatory Visit: Payer: Self-pay | Admitting: Family Medicine

## 2016-05-22 NOTE — Telephone Encounter (Signed)
Zolpidem last filled 04-22-16 #30  Promethazine last filled 02-20-16 #20  Last OV 03-20-16 No Future OV

## 2016-05-22 NOTE — Telephone Encounter (Signed)
Verbal refill given to Arbie Cookey at pharmacy

## 2016-06-16 ENCOUNTER — Other Ambulatory Visit: Payer: Self-pay | Admitting: Family Medicine

## 2016-06-24 ENCOUNTER — Telehealth: Payer: Self-pay

## 2016-06-24 MED ORDER — OSELTAMIVIR PHOSPHATE 75 MG PO CAPS: 75.0000 mg | ORAL_CAPSULE | Freq: Every day | ORAL | 0 refills | Status: DC

## 2016-06-24 NOTE — Telephone Encounter (Signed)
Pt left v/m; pt was exposed to type B flu over weekend; pt feels achy, h/a and lt side hurts; pt had appendectomy. Pt request tamiflu for preventative; no fever. Pt request cb.

## 2016-06-24 NOTE — Telephone Encounter (Signed)
eRx sent to pharmacy on file

## 2016-06-25 ENCOUNTER — Telehealth: Payer: Self-pay | Admitting: Family Medicine

## 2016-06-25 MED ORDER — ONDANSETRON HCL 4 MG PO TABS: 4.0000 mg | ORAL_TABLET | Freq: Three times a day (TID) | ORAL | 0 refills | Status: DC | PRN

## 2016-06-25 NOTE — Telephone Encounter (Signed)
Please call to check on pt. 

## 2016-06-25 NOTE — Telephone Encounter (Signed)
Patient Name: [name]  DOB: 1946/02/26    Initial Comment Caller states she has had a headache for two days, body aches, sore throat. She has been exposed to Flu-B. Chills. She has had Tamiflu called in. Abdominal pain.   Nurse Assessment  Nurse: Raphael Gibney, RN, Vanita Ingles Date/Time (Eastern Time): 06/25/2016 11:38:45 AM  Confirm and document reason for call. If symptomatic, describe symptoms. ---Caller states she was exposed to influenza B. Has a sore throat and headache. She has leg aches. She has chills. She is just getting out of bed. Tamiflu has been called in this am and she has not started it yet.  Does the patient have any new or worsening symptoms? ---Yes  Will a triage be completed? ---Yes  Related visit to physician within the last 2 weeks? ---No  Does the PT have any chronic conditions? (i.e. diabetes, asthma, etc.) ---Yes  List chronic conditions. ---cardiac myeopathy  Is this a behavioral health or substance abuse call? ---No     Guidelines    Guideline Title Affirmed Question Affirmed Notes  Influenza - Seasonal Influenza, questions about    Final Disposition User   [facility] [place], RN, Vanita Ingles    Disagree/Comply: Comply

## 2016-06-25 NOTE — Telephone Encounter (Signed)
Pt notified as instructed and pt voiced understanding. 

## 2016-06-25 NOTE — Telephone Encounter (Signed)
PLEASE NOTE: All timestamps contained within this report are represented as [place] Standard Time. CONFIDENTIALTY NOTICE: This fax transmission is intended only for the addressee. It contains information that is legally privileged, confidential or otherwise protected from use or disclosure. If you are not the intended recipient, you are strictly prohibited from reviewing, disclosing, copying using or disseminating any of this information or taking any action in reliance on or regarding this information. If you have received this fax in error, please notify us immediately by telephone so that we can arrange for its return to [place]. Phone: [phone], Toll-Free: [phone], Fax: [phone] Page: 1 of 2 Call Id: [id number] [place] Patient Name: [name] Gender: Female DOB: 11-07-1945 Age: 71 Y 10 M 21 D Return Phone Number: [phone] (Primary) City/State/Zip: [place] [address] Client Cora Day - Client Client Site [place] - Day Physician Arnette Norris - MD Who Is Calling Patient / Member / Family / Caregiver Call Type Triage / Clinical Caller Name Ioma Relationship To Patient Self Return Phone Number [phone] (Primary) Chief Complaint Headache Reason for Call Symptomatic / Request for [place] states she has had a headache for two days, body aches, sore throat. She has been exposed to Flu-B. Chills. She has had Tamiflu called in. Abdominal pain. Appointment Disposition EMR Appointment Not Necessary Info pasted into Epic Yes Nurse Assessment Nurse: Raphael Gibney, RN, Vanita Ingles Date/Time Eilene Ghazi Time): 06/25/2016 11:38:45 AM Confirm and document reason for call. If symptomatic, describe symptoms. ---Caller states she was exposed to influenza B. Has a sore throat and headache. She has leg aches. She has  chills. She is just getting out of bed. Tamiflu has been called in this am and she has not started it yet. Does the PT have any chronic conditions? (i.e. diabetes, asthma, etc.) ---Yes List chronic conditions. ---cardiac myeopathy Guidelines Guideline Title Affirmed Question Influenza - Seasonal Influenza, questions about Disp. Time Eilene Ghazi Time) Disposition Final User 06/25/2016 11:48:21 AM Home Care Yes Raphael Gibney, RN, [place] Advice Given Per Guideline HOME CARE: You should be able to treat this at home. INFLUENZA - KEY POINTS: * Influenza is commonly known as the 'flu'. * Cough: Use cough drops. INFLUENZA - GENERAL CARE ADVICE * Feeling dehydrated: Drink extra liquids. If the air in your home is dry, use a humidifier. INFLUENZA - TREATMENT WITH ANTIVIRAL DRUGS: * There are two antiviral medications that are helpful in treating this infection: oseltamivir (brand name Tamiflu) and zanamivir (brand name Relenza). * Treatment is recommended for HIGH RISK patients (e.g., age over 12 years, pregnant, HIV+, or chronic medical condition) with influenza or any patient with severe symptoms (per CDC). * The benefits are limited. Antiviral medications may reduce the time you are sick by 1 to 1.5 days. It helps reduce the symptoms, but does not cure the disease. CALL BACK IF: * You have more questions. CARE ADVICE given per INFLUENZA - SEASONAL (Adult) guideline. PLEASE NOTE: All timestamps contained within this report are represented as [place] Standard Time. CONFIDENTIALTY NOTICE: This fax transmission is intended only for the addressee. It contains information that is legally privileged, confidential or otherwise protected from use or disclosure. If you are not the intended recipient, you are strictly prohibited from reviewing, disclosing, copying using or disseminating any of this information or taking any action in reliance on or regarding this information. If you have received this  fax in error,  please notify us immediately by telephone so that we can arrange for its return to [place]. Phone: [phone], Toll-Free: [phone], Fax: [phone] Page: 2 of 2 Call Id: [id number]

## 2016-06-25 NOTE — Telephone Encounter (Signed)
Pt called back; in the last 30 mins pt has started to vomit. [facility]; pt also wants to know if should continue the tamiflu or not. Pt request cb.

## 2016-06-25 NOTE — Telephone Encounter (Signed)
Spoke to pt

## 2016-06-25 NOTE — Telephone Encounter (Signed)
That is up to her.  I would suggest she stop if if she does not have a fever. Tamiflu can certainly cause stomach upset and so can the flu.  If she is symptomatic with fever and body aches, once daily dosing x 10 days (prophylactic dosing) should be changed to twice daily dosing x 5 days - she has the correct quantity for both.  I will send in zofran now.  Please keep [place] updated and go to the ER if symptoms worsen overnight.

## 2016-07-01 ENCOUNTER — Other Ambulatory Visit: Payer: Self-pay | Admitting: Gastroenterology

## 2016-07-02 ENCOUNTER — Encounter: Payer: Self-pay | Admitting: Gastroenterology

## 2016-07-02 ENCOUNTER — Encounter (INDEPENDENT_AMBULATORY_CARE_PROVIDER_SITE_OTHER): Payer: Self-pay

## 2016-07-02 ENCOUNTER — Ambulatory Visit (INDEPENDENT_AMBULATORY_CARE_PROVIDER_SITE_OTHER): Payer: Medicare HMO | Admitting: Gastroenterology

## 2016-07-02 VITALS — BP 132/68 | HR 60 | Ht 62.0 in | Wt 132.0 lb

## 2016-07-02 DIAGNOSIS — K2100 Gastro-esophageal reflux disease with esophagitis, without bleeding: Secondary | ICD-10-CM

## 2016-07-02 DIAGNOSIS — R079 Chest pain, unspecified: Secondary | ICD-10-CM

## 2016-07-02 DIAGNOSIS — Z86010 Personal history of colonic polyps: Secondary | ICD-10-CM | POA: Diagnosis not present

## 2016-07-02 DIAGNOSIS — R1012 Left upper quadrant pain: Secondary | ICD-10-CM

## 2016-07-02 MED ORDER — PANTOPRAZOLE SODIUM 40 MG PO TBEC: 40.0000 mg | DELAYED_RELEASE_TABLET | Freq: Two times a day (BID) | ORAL | 3 refills | Status: DC

## 2016-07-02 NOTE — Progress Notes (Signed)
    History of Present Illness: This is a 71 year old female with GERD with esophagitis complaining of LUQ and L chest pain for 1 week. She relates her reflux symptoms have been under good control. She developed an illness with malaise sore throat and fatigue which was associated with nausea and vomiting for 1 day. She developed left upper quadrant and left lateral chest pain which is exacerbated by movement and bending and does not change with meals.  Current Medications, Allergies, Past Medical History, Past Surgical History, Family History and Social History were reviewed in [employer] record.  Physical Exam: General: Well developed, well nourished, no acute distress Head: Normocephalic and atraumatic Eyes:  sclerae anicteric, EOMI Ears: Normal auditory acuity Mouth: No deformity or lesions Lungs: Clear throughout to auscultation. Right lower lateral rib tenderness Heart: Regular rate and rhythm; no murmurs, rubs or bruits Abdomen: Soft, mild left upper quadrant tenderness and non distended. No masses, hepatosplenomegaly or hernias noted. Normal Bowel sounds Musculoskeletal: Symmetrical with no gross deformities  Pulses:  Normal pulses noted Extremities: No clubbing, cyanosis, edema or deformities noted Neurological: Alert oriented x 4, grossly nonfocal Psychological:  Alert and cooperative. Normal mood and affect  Assessment and Recommendations:  1. L chest pain and LUQ pain. Symptoms appear to be musculoskeletal. Tylenol 501,000 mg 3 times a day for the next 7-10 days. Heating pad as needed. Follow-up with PCP if symptoms not resolved.  2. GERD. Continue pantoprazole 40 mg twice a day and follow standard antireflux measures.  3. Personal history of adenomatous colon polyps. Surveillance colonoscopy due 10/2016.

## 2016-07-02 NOTE — Patient Instructions (Addendum)
We have sent the following medications to your pharmacy for you to pick up at your convenience: pantoprazole.   Take Tylenol 500 mg extra strength 1-2 three times a day as needed.   You will be due for a recall colonoscopy in 10/2016. We will send you a reminder in the mail when it gets closer to that time.    Thank you for choosing me and [facility].  Pricilla Riffle. Dagoberto Ligas., MD., Marval Regal

## 2016-07-04 ENCOUNTER — Encounter: Payer: Self-pay | Admitting: Primary Care

## 2016-07-04 ENCOUNTER — Ambulatory Visit (INDEPENDENT_AMBULATORY_CARE_PROVIDER_SITE_OTHER): Payer: Medicare HMO | Admitting: Primary Care

## 2016-07-04 VITALS — BP 120/78 | HR 62 | Temp 98.0°F | Ht 62.0 in | Wt 133.8 lb

## 2016-07-04 DIAGNOSIS — R109 Unspecified abdominal pain: Secondary | ICD-10-CM | POA: Diagnosis not present

## 2016-07-04 LAB — CBC WITH DIFFERENTIAL/PLATELET
BASOS PCT: 0.8 % (ref 0.0–3.0)
Basophils Absolute: 0.1 10*3/uL (ref 0.0–0.1)
EOS ABS: 0.4 10*3/uL (ref 0.0–0.7)
EOS PCT: 4.9 % (ref 0.0–5.0)
HEMATOCRIT: 39 % (ref 36.0–46.0)
HEMOGLOBIN: 13.2 g/dL (ref 12.0–15.0)
LYMPHS PCT: 22.9 % (ref 12.0–46.0)
Lymphs Abs: 1.6 10*3/uL (ref 0.7–4.0)
MCHC: 33.9 g/dL (ref 30.0–36.0)
MCV: 88.1 fl (ref 78.0–100.0)
Monocytes Absolute: 0.7 10*3/uL (ref 0.1–1.0)
Monocytes Relative: 9 % (ref 3.0–12.0)
Neutro Abs: 4.5 10*3/uL (ref 1.4–7.7)
Neutrophils Relative %: 62.4 % (ref 43.0–77.0)
Platelets: 314 10*3/uL (ref 150.0–400.0)
RBC: 4.43 Mil/uL (ref 3.87–5.11)
RDW: 12.6 % (ref 11.5–15.5)
WBC: 7.2 10*3/uL (ref 4.0–10.5)

## 2016-07-04 LAB — POC URINALSYSI DIPSTICK (AUTOMATED)
Bilirubin, UA: NEGATIVE
Blood, UA: NEGATIVE
Glucose, UA: NEGATIVE
Ketones, UA: NEGATIVE
NITRITE UA: NEGATIVE
PROTEIN UA: NEGATIVE
Spec Grav, UA: 1.015 (ref 1.010–1.025)
UROBILINOGEN UA: NEGATIVE U/dL — AB
pH, UA: 7 (ref 5.0–8.0)

## 2016-07-04 LAB — COMPREHENSIVE METABOLIC PANEL
ALBUMIN: 4.1 g/dL (ref 3.5–5.2)
ALT: 50 U/L — ABNORMAL HIGH (ref 0–35)
AST: 28 U/L (ref 0–37)
Alkaline Phosphatase: 65 U/L (ref 39–117)
BUN: 11 mg/dL (ref 6–23)
CALCIUM: 9.8 mg/dL (ref 8.4–10.5)
CHLORIDE: 104 meq/L (ref 96–112)
CO2: 31 mEq/L (ref 19–32)
CREATININE: 0.65 mg/dL (ref 0.40–1.20)
GFR: 95.25 mL/min (ref >60.00)
Glucose, Bld: 96 mg/dL (ref 70–99)
Potassium: 4.4 mEq/L (ref 3.5–5.1)
Sodium: 141 mEq/L (ref 135–145)
TOTAL PROTEIN: 6.9 g/dL (ref 6.0–8.3)
Total Bilirubin: 0.4 mg/dL (ref 0.2–1.2)

## 2016-07-04 NOTE — Progress Notes (Signed)
Pre visit review using our clinic review tool, if applicable. No additional management support is needed unless otherwise documented below in the visit note. 

## 2016-07-04 NOTE — Patient Instructions (Signed)
Complete lab work prior to leaving today. I will notify you of your results once received.   Your urine doesn't show evidence of infection.  It was a pleasure to see you today!

## 2016-07-04 NOTE — Addendum Note (Signed)
Addended by: Jacqualin Combes on: 07/04/2016 10:40 AM   Modules accepted: Orders

## 2016-07-04 NOTE — Progress Notes (Signed)
Subjective:    Patient ID: [name], female    DOB: [birth year], 71 y.o.   MRN: [id number]  HPI  [name] is a 71 year old female with a history of hiatal hernia, GERD who presents today with a chief complaint of abdominal pain. Her pain is located to the left lower abdomen that she first noticed about 5 days ago. Her pain initially was noticeable to the left lateral side which has since moved to the LLQ. She describes her pain as a "labor pain" that occurs intermittently. Over the past 3 days she's noticed an increase in pain. She's been taking tylenol and applying heat without much improvement.   She saw her GI doctor 3 days ago who thought she had a pulled muscle. She was exposed to influenza 2 weeks ago and has had decrease in appetite, headaches, body aches. Overall these symptoms have improved except for nausea and lack of appetite.   She denies changes in frequency, hematuria, flank pain, vaginal discharge, no recent fever, vomiting. Her symptoms are not effected by eating or drinking.   Review of Systems  Constitutional: Positive for fatigue. Negative for fever.  Respiratory: Negative for shortness of breath.   Cardiovascular: Negative for chest pain.  Gastrointestinal: Positive for abdominal pain, constipation, diarrhea and nausea. Negative for blood in stool and vomiting.  Musculoskeletal: Positive for myalgias.       Past Medical History:  Diagnosis Date  . Cardiomyopathy    nonischemic  . Complication of anesthesia   . Erosive esophagitis    LA Classification Grade B  . GERD (gastroesophageal reflux disease)   . Hiatal hernia   . Hyperlipidemia   . Melanoma ([place])    basil cell, pt reports no melanoma  . Migraine   . Osteopenia   . PONV (postoperative nausea and vomiting)   . Sessile colonic polyp 10/2011   adenoma     Social History   Social History  . Marital status: Married    Spouse name: N/A  . Number of children: 3  . Years of education: N/A    Occupational History  . Housewife    Social History Main Topics  . Smoking status: Former Smoker    Quit date: 06/03/1981  . Smokeless tobacco: Never Used  . Alcohol use 1.8 oz/week    3 Glasses of wine per week     Comment: Wine on occasional  . Drug use: No  . Sexual activity: Not on file   Other Topics Concern  . Not on file   Social History Narrative   Lives alone in two-story home.     Her husband passed away unexpectedly.  They have three children- one is a pediatrician.   Desires CPR.    Past Surgical History:  Procedure Laterality Date  . APPENDECTOMY    . CARDIAC CATHETERIZATION    . CATARACT EXTRACTION W/PHACO Right 03/18/2016   Procedure: CATARACT EXTRACTION PHACO AND INTRAOCULAR LENS PLACEMENT (IOC);  Surgeon: Birder [name], MD;  Location: [facility] ORS;  Service: Ophthalmology;  Laterality: Right;  [place] 45.3 AP% 22.2 CDE 10.07 Fluid pack lot # [id number]  . CATARACT EXTRACTION W/PHACO Left 04/08/2016   Procedure: CATARACT EXTRACTION PHACO AND INTRAOCULAR LENS PLACEMENT (IOC);  Surgeon: Birder [name], MD;  Location: [facility] ORS;  Service: Ophthalmology;  Laterality: Left;  [place] 00:41 AP% 15.0 CDE 6.27 Fluid pack lot # [id number]  . TUBAL LIGATION    . VAGINAL HYSTERECTOMY      Family History  Problem Relation Age of Onset  . Alzheimer's disease Mother   . Heart disease Father   . Arthritis/Rheumatoid Sister   . Colon cancer Neg Hx   . Stomach cancer Neg Hx   . Rectal cancer Neg Hx   . Esophageal cancer Neg Hx     Allergies  Allergen Reactions  . Penicillins Anaphylaxis and Hives    Has patient had a PCN reaction causing immediate rash, facial/tongue/throat swelling, SOB or lightheadedness with hypotension: {Yes Has patient had a PCN reaction causing severe rash involving mucus membranes or skin necrosis: {no Has patient had a PCN reaction that required hospitalization {yes Has patient had a PCN reaction occurring within the last 10 years: {no If all of the  above answers are "NO", then may proceed with Cephalosporin use.  . Lansoprazole     REACTION: headaches  . Sulfa Antibiotics Hives  . Ceclor [Cefaclor] Itching and Rash  . Ceftin [Cefuroxime Axetil] Rash  . Clindamycin/Lincomycin Itching and Rash  . Latex Itching and Rash  . Lincomycin Hcl Itching and Rash  . Septra [Sulfamethoxazole-Trimethoprim] Rash    Current Outpatient Prescriptions on File Prior to Visit  Medication Sig Dispense Refill  . acetaminophen (TYLENOL) 500 MG tablet Take 1,000 mg by mouth every 6 (six) hours as needed for moderate pain or headache.    Marland Kitchen amLODipine (NORVASC) 2.5 MG tablet Take 1 tablet (2.5 mg total) by mouth daily. 90 tablet 3  . carvedilol (COREG) 12.5 MG tablet TAKE ONE TABLET TWICE DAILY WITH A MEAL 180 tablet 3  . Cholecalciferol (VITAMIN D3) 2000 units capsule Take 2,000 Units by mouth daily.    . enalapril (VASOTEC) 10 MG tablet Take 1 tablet (10 mg total) by mouth daily. 90 tablet 3  . escitalopram (LEXAPRO) 20 MG tablet TAKE ONE TABLET BY MOUTH EVERY DAY 30 tablet 1  . metroNIDAZOLE (METROCREAM) 0.75 % cream Apply 1 application topically at bedtime.    . Multiple Vitamin (MULTIVITAMIN) capsule Take 1 capsule by mouth daily.      . pantoprazole (PROTONIX) 40 MG tablet Take 1 tablet (40 mg total) by mouth 2 (two) times daily. 180 tablet 3  . promethazine (PHENERGAN) 12.5 MG tablet TAKE ONE TABLET BY MOUTH EVERY 8 HOURS AS NEEDED FOR NAUSEA/VOMITING 20 tablet 0  . simvastatin (ZOCOR) 20 MG tablet Take 1 tablet (20 mg total) by mouth at bedtime. 90 tablet 3  . tiZANidine (ZANAFLEX) 2 MG tablet TAKE ONE TABLET BY MOUTH EVERY EIGHT HOURS AS NEEDED FOR MUSCLE SPASM 30 tablet 1  . zolpidem (AMBIEN) 10 MG tablet TAKE ONE TABLET BY MOUTH AT BEDTIME 30 tablet 0  . traMADol (ULTRAM) 50 MG tablet Take 50 mg by mouth daily as needed for moderate pain.     No current facility-administered medications on file prior to visit.     BP 120/78   Pulse 62   Temp  98 F (36.7 C) (Oral)   Ht 5\' 2"  (1.575 m)   Wt 133 lb 12.8 oz (60.7 kg)   SpO2 97%   BMI 24.47 kg/m    Objective:   Physical Exam  Constitutional: She appears well-nourished. She does not appear ill.  Neck: Neck supple.  Cardiovascular: Normal rate and regular rhythm.   Pulmonary/Chest: Effort normal and breath sounds normal.  Abdominal: Soft. Normal appearance and bowel sounds are normal. There is tenderness in the left upper quadrant and left lower quadrant. There is no CVA tenderness.  Skin: Skin is warm and dry.  Assessment & Plan:  Abdominal Pain:  Located to left lateral abdomen and now LLQ x 5 days. Exam today with tenderness as noted above. UA today: negative, no suspicion of renal stones Check CBC, CMP to rule out infection. Could be constipation, no history of diverticulosis based off of colonoscopy in 2013.  She appears well and is in no distress. She does not appear sickly.  Sheral Flow, NP

## 2016-07-07 DIAGNOSIS — Z961 Presence of intraocular lens: Secondary | ICD-10-CM | POA: Diagnosis not present

## 2016-07-09 ENCOUNTER — Encounter: Payer: Self-pay | Admitting: Family Medicine

## 2016-07-09 ENCOUNTER — Ambulatory Visit (INDEPENDENT_AMBULATORY_CARE_PROVIDER_SITE_OTHER): Payer: Medicare HMO | Admitting: Family Medicine

## 2016-07-09 VITALS — BP 106/70 | HR 67 | Wt 132.8 lb

## 2016-07-09 DIAGNOSIS — E785 Hyperlipidemia, unspecified: Secondary | ICD-10-CM

## 2016-07-09 DIAGNOSIS — F4320 Adjustment disorder, unspecified: Secondary | ICD-10-CM | POA: Diagnosis not present

## 2016-07-09 DIAGNOSIS — G4700 Insomnia, unspecified: Secondary | ICD-10-CM

## 2016-07-09 DIAGNOSIS — F4321 Adjustment disorder with depressed mood: Secondary | ICD-10-CM

## 2016-07-09 LAB — LIPID PANEL
CHOLESTEROL: 156 mg/dL (ref 0–200)
HDL: 51.5 mg/dL (ref >39.00)
NONHDL: 104.15
TRIGLYCERIDES: 218 mg/dL — AB (ref 0.0–149.0)
Total CHOL/HDL Ratio: 3
VLDL: 43.6 mg/dL — ABNORMAL HIGH (ref 0.0–40.0)

## 2016-07-09 LAB — LDL CHOLESTEROL, DIRECT: Direct LDL: 70 mg/dL

## 2016-07-09 LAB — TSH: TSH: 1.76 u[IU]/mL (ref 0.35–4.50)

## 2016-07-09 MED ORDER — ESCITALOPRAM OXALATE 20 MG PO TABS: 20.0000 mg | ORAL_TABLET | Freq: Every day | ORAL | 11 refills | Status: DC

## 2016-07-09 MED ORDER — ZOLPIDEM TARTRATE 10 MG PO TABS: 10.0000 mg | ORAL_TABLET | Freq: Every day | ORAL | 1 refills | Status: DC

## 2016-07-09 NOTE — Assessment & Plan Note (Signed)
>  25 minutes spent in face to face time with patient, >50% spent in counselling or coordination of care discuss depression, anxiety and insomnia. No changes made to current rxs. Lexapro eRx refills sent to pharmacy on file. Ambien rx printed and given to pt.

## 2016-07-09 NOTE — Progress Notes (Signed)
Subjective:   Patient ID: [name], female    DOB: 07/10/1945, 71 y.o.   MRN: [id number]  [name] is a pleasant 71 y.o. year old femalemale who presents to clinic today with Follow-up  on 07/09/2016  HPI:  Insomnia, prolonged grief and depression- has been doing well with lexapro since her husbands death and would like to continue this. She did try trazodone shortly after his unexpected death but it made her feel groggy.  Only Lorrin Mais has been effective without side effects the following day.  She has been more "depressed" lately since she has not been able to go to [place] to see her grandchildren over the past month.  She had cataracts surgery with complications followed by influenza and gastroenteritis. Starting to feel better and plans on seeing them soon.  Denies any current symptoms of anxiety or depression.  No SI or HI.   Current Outpatient Prescriptions on File Prior to Visit  Medication Sig Dispense Refill  . acetaminophen (TYLENOL) 500 MG tablet Take 1,000 mg by mouth every 6 (six) hours as needed for moderate pain or headache.    Marland Kitchen amLODipine (NORVASC) 2.5 MG tablet Take 1 tablet (2.5 mg total) by mouth daily. 90 tablet 3  . carvedilol (COREG) 12.5 MG tablet TAKE ONE TABLET TWICE DAILY WITH A MEAL 180 tablet 3  . Cholecalciferol (VITAMIN D3) 2000 units capsule Take 2,000 Units by mouth daily.    . enalapril (VASOTEC) 10 MG tablet Take 1 tablet (10 mg total) by mouth daily. 90 tablet 3  . metroNIDAZOLE (METROCREAM) 0.75 % cream Apply 1 application topically at bedtime.    . Multiple Vitamin (MULTIVITAMIN) capsule Take 1 capsule by mouth daily.      . pantoprazole (PROTONIX) 40 MG tablet Take 1 tablet (40 mg total) by mouth 2 (two) times daily. 180 tablet 3  . promethazine (PHENERGAN) 12.5 MG tablet TAKE ONE TABLET BY MOUTH EVERY 8 HOURS AS NEEDED FOR NAUSEA/VOMITING 20 tablet 0  . simvastatin (ZOCOR) 20 MG tablet Take 1 tablet (20 mg total) by mouth at bedtime. 90  tablet 3  . tiZANidine (ZANAFLEX) 2 MG tablet TAKE ONE TABLET BY MOUTH EVERY EIGHT HOURS AS NEEDED FOR MUSCLE SPASM 30 tablet 1  . traMADol (ULTRAM) 50 MG tablet Take 50 mg by mouth daily as needed for moderate pain.     No current facility-administered medications on file prior to visit.     Allergies  Allergen Reactions  . Penicillins Anaphylaxis and Hives    Has patient had a PCN reaction causing immediate rash, facial/tongue/throat swelling, SOB or lightheadedness with hypotension: {Yes Has patient had a PCN reaction causing severe rash involving mucus membranes or skin necrosis: {no Has patient had a PCN reaction that required hospitalization {yes Has patient had a PCN reaction occurring within the last 10 years: {no If all of the above answers are "NO", then may proceed with Cephalosporin use.  . Lansoprazole     REACTION: headaches  . Sulfa Antibiotics Hives  . Ceclor [Cefaclor] Itching and Rash  . Ceftin [Cefuroxime Axetil] Rash  . Clindamycin/Lincomycin Itching and Rash  . Latex Itching and Rash  . Lincomycin Hcl Itching and Rash  . Septra [Sulfamethoxazole-Trimethoprim] Rash    Past Medical History:  Diagnosis Date  . Cardiomyopathy    nonischemic  . Complication of anesthesia   . Erosive esophagitis    LA Classification Grade B  . GERD (gastroesophageal reflux disease)   . Hiatal hernia   .  Hyperlipidemia   . Melanoma ([place])    basil cell, pt reports no melanoma  . Migraine   . Osteopenia   . PONV (postoperative nausea and vomiting)   . Sessile colonic polyp 10/2011   adenoma    Past Surgical History:  Procedure Laterality Date  . APPENDECTOMY    . CARDIAC CATHETERIZATION    . CATARACT EXTRACTION W/PHACO Right 03/18/2016   Procedure: CATARACT EXTRACTION PHACO AND INTRAOCULAR LENS PLACEMENT (IOC);  Surgeon: Birder [name], MD;  Location: [facility] ORS;  Service: Ophthalmology;  Laterality: Right;  [place] 45.3 AP% 22.2 CDE 10.07 Fluid pack lot # [id number]  .  CATARACT EXTRACTION W/PHACO Left 04/08/2016   Procedure: CATARACT EXTRACTION PHACO AND INTRAOCULAR LENS PLACEMENT (IOC);  Surgeon: Birder [name], MD;  Location: [facility] OR[facility];  Service: Ophthalmology;  Laterality: Left;  [place] 00:41 AP% 15.0 CDE 6.27 Fluid pack lot # [id number]  . TUBAL LIGATION    . VAGINAL HYSTERECTOMY      Family History  Problem Relation Age of Onset  . Alzheimer's disease Mother   . Heart disease Father   . Arthritis/Rheumatoid Sister   . Colon cancer Neg Hx   . Stomach cancer Neg Hx   . Rectal cancer Neg Hx   . Esophageal cancer Neg Hx     Social History   Social History  . Marital status: Widowed    Spouse name: N/A  . Number of children: 3  . Years of education: N/A   Occupational History  . Not on file.   Social History Main Topics  . Smoking status: Former Smoker    Quit date: 06/03/1981  . Smokeless tobacco: Never Used  . Alcohol use 1.8 oz/week    3 Glasses of wine per week     Comment: Wine on occasional  . Drug use: No  . Sexual activity: Not on file   Other Topics Concern  . Not on file   Social History Narrative   Lives alone in two-story home.     Her husband passed away unexpectedly.  They have three children- one is a pediatrician.   Desires CPR.   The PMH, PSH, Social History, Family History, Medications, and allergies have been reviewed in [facility], and have been updated if relevant.   Review of Systems  Neurological: Negative.   Psychiatric/Behavioral: Positive for sleep disturbance. Negative for agitation, behavioral problems, confusion, decreased concentration, dysphoric mood, hallucinations, self-injury and suicidal ideas. The patient is not nervous/anxious and is not hyperactive.   All other systems reviewed and are negative.      Objective:    BP 106/70 (BP Location: Left Arm, Patient Position: Sitting, Cuff Size: Normal)   Pulse 67   Wt 132 lb 12.8 oz (60.2 kg)   BMI 24.29 kg/m    Physical Exam  Constitutional: She is  oriented to person, place, and time. She appears well-developed and well-nourished. No distress.  HENT:  Head: Normocephalic and atraumatic.  Eyes: Conjunctivae are normal.  Cardiovascular: Normal rate.   Pulmonary/Chest: Effort normal.  Musculoskeletal: Normal range of motion. She exhibits no edema.  Neurological: She is alert and oriented to person, place, and time. No cranial nerve deficit.  Skin: Skin is warm and dry. She is not diaphoretic.  Psychiatric: She has a normal mood and affect. Her behavior is normal. Judgment and thought content normal.  Nursing note and vitals reviewed.         Assessment & Plan:   Insomnia, unspecified type - Plan: TSH  Grief  reaction  Hyperlipidemia, unspecified hyperlipidemia type - Plan: Lipid panel No Follow-up on file.

## 2016-07-22 DIAGNOSIS — C44722 Squamous cell carcinoma of skin of right lower limb, including hip: Secondary | ICD-10-CM | POA: Diagnosis not present

## 2016-07-22 DIAGNOSIS — D485 Neoplasm of uncertain behavior of skin: Secondary | ICD-10-CM | POA: Diagnosis not present

## 2016-07-23 DIAGNOSIS — M3501 Sicca syndrome with keratoconjunctivitis: Secondary | ICD-10-CM | POA: Diagnosis not present

## 2016-08-05 DIAGNOSIS — C44722 Squamous cell carcinoma of skin of right lower limb, including hip: Secondary | ICD-10-CM | POA: Diagnosis not present

## 2016-08-05 DIAGNOSIS — L570 Actinic keratosis: Secondary | ICD-10-CM | POA: Diagnosis not present

## 2016-08-06 ENCOUNTER — Telehealth: Payer: Self-pay | Admitting: Internal Medicine

## 2016-08-06 ENCOUNTER — Telehealth: Payer: Self-pay

## 2016-08-06 NOTE — Telephone Encounter (Signed)
Noted, agree ED eval appropriate

## 2016-08-06 NOTE — Telephone Encounter (Signed)
New message    Pt was driving her car and everything turned white and she felt like she was floating 168/89 (Yesterday), having a [place] tightness in her chest this morning

## 2016-08-06 NOTE — Telephone Encounter (Signed)
[facility]; see cardiology phone note 08/06/16; advised pt ntg given in 2013 by Dr Harrington Challenger. Advised pt Dr Deborra Medina not in office today but per the cardiology note pt was advised to go to ED. Pt is going out of town on 08/07/16. Pt is having continuous tightness in lt chest; pt thinks could be anxiety or GI related. Pt said" it cannot be my heart because I take my medicine everyday".  I advised pt she needed to be evaluated to see if it is heart related or anxiety or what could be causing the tightness. Pt said she would go to ED but she would have to get someone to drive her; I advised she could call EMS but pt said no she will get someone to drive her to [facility] ED. Dr Deborra Medina out of office will send to provider in office.

## 2016-08-06 NOTE — Telephone Encounter (Signed)
Reviewed with B. Bhagat, PA and if pt is still having tightness she should go to ED. If tightness comes and goes can be seen in office today.  I spoke with pt who reports tightness is continuous.  I told pt she should go to ED for evaluation.  She lives in [place] and will go to [place].  I told pt she should not drive and should call EMS if needed.

## 2016-08-06 NOTE — Telephone Encounter (Signed)
Received call transferred directly from operator and spoke with pt. She reports [place] "[place] white."  Felt like she was floating. She pulled over and drank [place] water and then went to pharmacy where BP was elevated. (listed below).  She has never had anything like this before. She was able to sleep OK last night but feels anxious as she is supposed to drive to [place]. This AM she is not having symptoms like yesterday but does complain of tightness in left chest.  No radiation. Wondering if this is related to anxiety. She woke up with tightness this morning.  Not related to exertion. She has not had this before.  Will review with provider in office.

## 2016-08-07 DIAGNOSIS — G450 Vertebro-basilar artery syndrome: Secondary | ICD-10-CM | POA: Diagnosis not present

## 2016-08-08 ENCOUNTER — Ambulatory Visit: Payer: Medicare HMO | Admitting: Internal Medicine

## 2016-08-10 NOTE — Progress Notes (Signed)
Cardiology Office Note   Date:  08/11/2016   ID:  [name], DOB Apr 22, 1945, MRN [id number]  PCP:  Lucille Passy, MD  Cardiologist:   Dorris Carnes, MD   Pt presents for eval of dizziness   History of Present Illness: [name] is a 71 y.o. female with a history of NICM (LVEF 50 to 55% in 2011), LBBB, CP  Normal myovue in 2014  I saw her in Feb 2018 Pt called in May 30 It was mid afternoon  She [place] [address]  Suddenly things became wihite  She was afraid she was going to pass out  She Pulled over to side of road  Felt chest tightness start.  Denies heart racing prior to the event  Waited about 10 min  Went to convenience statin  Got drink and peanuts    Since then has felt a [place] light headed  No synciope Nothing like that afternoon  She is very active Helps with grand kids    She is scared       Current Meds  Medication Sig  . acetaminophen (TYLENOL) 500 MG tablet Take 1,000 mg by mouth every 6 (six) hours as needed for moderate pain or headache.  Marland Kitchen amLODipine (NORVASC) 2.5 MG tablet Take 1 tablet (2.5 mg total) by mouth daily.  . carvedilol (COREG) 12.5 MG tablet TAKE ONE TABLET TWICE DAILY WITH A MEAL  . Cholecalciferol (VITAMIN D3) 2000 units capsule Take 2,000 Units by mouth daily.  . enalapril (VASOTEC) 10 MG tablet Take 1 tablet (10 mg total) by mouth daily.  Marland Kitchen escitalopram (LEXAPRO) 20 MG tablet Take 1 tablet (20 mg total) by mouth daily.  . metroNIDAZOLE (METROCREAM) 0.75 % cream Apply 1 application topically at bedtime.  . Multiple Vitamin (MULTIVITAMIN) capsule Take 1 capsule by mouth daily.    . pantoprazole (PROTONIX) 40 MG tablet Take 1 tablet (40 mg total) by mouth 2 (two) times daily.  . promethazine (PHENERGAN) 12.5 MG tablet TAKE ONE TABLET BY MOUTH EVERY 8 HOURS AS NEEDED FOR NAUSEA/VOMITING  . simvastatin (ZOCOR) 20 MG tablet Take 1 tablet (20 mg total) by mouth at bedtime.  Marland Kitchen tiZANidine (ZANAFLEX) 2 MG tablet TAKE ONE TABLET BY MOUTH EVERY  EIGHT HOURS AS NEEDED FOR MUSCLE SPASM  . zolpidem (AMBIEN) 10 MG tablet Take 1 tablet (10 mg total) by mouth at bedtime.     Allergies:   Penicillins; Lansoprazole; Sulfa antibiotics; Ceclor [cefaclor]; Ceftin [cefuroxime axetil]; Clindamycin/lincomycin; Latex; Lincomycin hcl; and Septra [sulfamethoxazole-trimethoprim]   Past Medical History:  Diagnosis Date  . Cardiomyopathy    nonischemic  . Complication of anesthesia   . Erosive esophagitis    LA Classification Grade B  . GERD (gastroesophageal reflux disease)   . Hiatal hernia   . Hyperlipidemia   . Melanoma ([place])    basil cell, pt reports no melanoma  . Migraine   . Osteopenia   . PONV (postoperative nausea and vomiting)   . Sessile colonic polyp 10/2011   adenoma    Past Surgical History:  Procedure Laterality Date  . APPENDECTOMY    . CARDIAC CATHETERIZATION    . CATARACT EXTRACTION W/PHACO Right 03/18/2016   Procedure: CATARACT EXTRACTION PHACO AND INTRAOCULAR LENS PLACEMENT (IOC);  Surgeon: Birder [name], MD;  Location: [facility];  Service: Ophthalmology;  Laterality: Right;  [place] 45.3 AP% 22.2 CDE 10.07 Fluid pack lot # [id number]  . CATARACT EXTRACTION W/PHACO Left 04/08/2016   Procedure: CATARACT EXTRACTION PHACO  AND INTRAOCULAR LENS PLACEMENT (IOC);  Surgeon: Birder [name], MD;  Location: [facility];  Service: Ophthalmology;  Laterality: Left;  [place] 00:41 AP% 15.0 CDE 6.27 Fluid pack lot # [id number]  . TUBAL LIGATION    . VAGINAL HYSTERECTOMY       Social History:  The patient  reports that she quit smoking about 35 years ago. She has never used smokeless tobacco. She reports that she drinks about 1.8 oz of alcohol per week . She reports that she does not use drugs.   Family History:  The patient's family history includes Alzheimer's disease in her mother; Arthritis/Rheumatoid in her sister; Heart disease in her father.    ROS:  Please see the history of present illness. All other systems are reviewed and   Negative to the above problem except as noted.    PHYSICAL EXAM: VS:  BP 118/62   Pulse 63   Ht 5\' 2"  (1.575 m)   Wt 60.1 kg (132 lb 6.4 oz)   SpO2 96%   BMI 24.22 kg/m   GEN: Well nourished, well developed, in no acute distress  HEENT: normal  Neck: no JVD, carotid bruits, or masses Cardiac: RRR; no murmurs, rubs, or gallops,no edema  Respiratory:  clear to auscultation bilaterally, normal work of breathing GI: soft, nontender, nondistended, + BS  No hepatomegaly  MS: no deformity Moving all extremities   Skin: warm and dry, no rash Neuro:  Strength and sensation are intact Psych: euthymic mood, full affect   EKG:  EKG is ordered today.  Sinurs bradycardia 59 bpm  LBBB  PR interval 190 msec  QRS 138 msec     Lipid Panel    Component Value Date/Time   CHOL 156 07/09/2016 1027   TRIG 218.0 (H) 07/09/2016 1027   HDL 51.50 07/09/2016 1027   CHOLHDL 3 07/09/2016 1027   VLDL 43.6 (H) 07/09/2016 1027   LDLCALC 68 03/22/2015 1000   LDLDIRECT 70.0 07/09/2016 1027      Wt Readings from Last 3 Encounters:  08/11/16 60.1 kg (132 lb 6.4 oz)  07/09/16 60.2 kg (132 lb 12.8 oz)  07/04/16 60.7 kg (133 lb 12.8 oz)      ASSESSMENT AND PLAN:  1  Spell  Episode of near syncope  ? Orthostatic  She is not orthostatic today  ? Arrhythmic   ? Perfusion WIll set up for event monitor.  Set up for echo  Set up for carotid USN Encouraged her to increase fluid intake    2.  NICM  Mild LV dysfunction on echo  Keep on same regimen    3  LBBB  EKG is unchanged    F?U based on test results     Current medicines are reviewed at length with the patient today.  The patient does not have concerns regarding medicines.  Signed, Dorris Carnes, MD  08/11/2016 11:52 AM    [facility] [place], [place], [place]  [address] Phone: [phone]; Fax: [phone]

## 2016-08-11 ENCOUNTER — Ambulatory Visit (INDEPENDENT_AMBULATORY_CARE_PROVIDER_SITE_OTHER): Payer: Medicare HMO | Admitting: Internal Medicine

## 2016-08-11 ENCOUNTER — Encounter: Payer: Self-pay | Admitting: Internal Medicine

## 2016-08-11 VITALS — BP 118/62 | HR 63 | Ht 62.0 in | Wt 132.4 lb

## 2016-08-11 DIAGNOSIS — I447 Left bundle-branch block, unspecified: Secondary | ICD-10-CM

## 2016-08-11 DIAGNOSIS — R55 Syncope and collapse: Secondary | ICD-10-CM | POA: Diagnosis not present

## 2016-08-11 DIAGNOSIS — I429 Cardiomyopathy, unspecified: Secondary | ICD-10-CM | POA: Diagnosis not present

## 2016-08-11 NOTE — Patient Instructions (Signed)
Medication Instructions:  Your physician recommends that you continue on your current medications as directed. Please refer to the Current Medication list given to you today.   Labwork: None Ordered   Testing/Procedures: Please schedule as many of these in [place] as possible Your physician has requested that you have a carotid duplex. This test is an ultrasound of the carotid arteries in your neck. It looks at blood flow through these arteries that supply the brain with blood. Allow one hour for this exam. There are no restrictions or special instructions.  Your physician has requested that you have an echocardiogram. Echocardiography is a painless test that uses sound waves to create images of your heart. It provides your doctor with information about the size and shape of your heart and how well your heart's chambers and valves are working. This procedure takes approximately one hour. There are no restrictions for this procedure.  Your physician has recommended that you wear an event monitor. Event monitors are medical devices that record the heart's electrical activity. Doctors most often [place] these monitors to diagnose arrhythmias. Arrhythmias are problems with the speed or rhythm of the heartbeat. The monitor is a small, portable device. You can wear one while you do your normal daily activities. This is usually used to diagnose what is causing palpitations/syncope (passing out).    Follow-Up: Your physician recommends that you schedule a follow-up appointment in: as needed with Dr. Harrington Challenger based on results of tests   If you need a refill on your cardiac medications before your next appointment, please call your pharmacy.   Thank you for choosing [facility]! Christen Bame, RN [phone]

## 2016-08-12 ENCOUNTER — Ambulatory Visit (INDEPENDENT_AMBULATORY_CARE_PROVIDER_SITE_OTHER): Payer: Medicare HMO

## 2016-08-12 DIAGNOSIS — R55 Syncope and collapse: Secondary | ICD-10-CM | POA: Diagnosis not present

## 2016-08-13 ENCOUNTER — Ambulatory Visit (HOSPITAL_COMMUNITY): Payer: Medicare HMO | Attending: Cardiology

## 2016-08-13 ENCOUNTER — Other Ambulatory Visit: Payer: Self-pay

## 2016-08-13 DIAGNOSIS — I371 Nonrheumatic pulmonary valve insufficiency: Secondary | ICD-10-CM | POA: Diagnosis not present

## 2016-08-13 DIAGNOSIS — I429 Cardiomyopathy, unspecified: Secondary | ICD-10-CM | POA: Insufficient documentation

## 2016-08-13 DIAGNOSIS — I081 Rheumatic disorders of both mitral and tricuspid valves: Secondary | ICD-10-CM | POA: Diagnosis not present

## 2016-08-13 DIAGNOSIS — R55 Syncope and collapse: Secondary | ICD-10-CM | POA: Insufficient documentation

## 2016-08-13 DIAGNOSIS — I447 Left bundle-branch block, unspecified: Secondary | ICD-10-CM | POA: Insufficient documentation

## 2016-08-14 ENCOUNTER — Ambulatory Visit (HOSPITAL_COMMUNITY)
Admission: RE | Admit: 2016-08-14 | Discharge: 2016-08-14 | Disposition: A | Discharge status: Home / Self Care | Payer: Medicare HMO | Source: Ambulatory Visit | Attending: Cardiovascular Disease | Admitting: Cardiovascular Disease

## 2016-08-14 DIAGNOSIS — R55 Syncope and collapse: Secondary | ICD-10-CM | POA: Diagnosis not present

## 2016-08-14 DIAGNOSIS — I6523 Occlusion and stenosis of bilateral carotid arteries: Secondary | ICD-10-CM | POA: Diagnosis not present

## 2016-08-19 ENCOUNTER — Encounter: Payer: Self-pay | Admitting: Family Medicine

## 2016-08-19 ENCOUNTER — Ambulatory Visit (INDEPENDENT_AMBULATORY_CARE_PROVIDER_SITE_OTHER): Payer: Medicare HMO | Admitting: Family Medicine

## 2016-08-19 VITALS — BP 140/80 | HR 60 | Ht 62.0 in | Wt 134.0 lb

## 2016-08-19 DIAGNOSIS — G4700 Insomnia, unspecified: Secondary | ICD-10-CM

## 2016-08-19 DIAGNOSIS — R55 Syncope and collapse: Secondary | ICD-10-CM

## 2016-08-19 DIAGNOSIS — F329 Major depressive disorder, single episode, unspecified: Secondary | ICD-10-CM | POA: Diagnosis not present

## 2016-08-19 DIAGNOSIS — F32A Depression, unspecified: Secondary | ICD-10-CM

## 2016-08-19 MED ORDER — ZOLPIDEM TARTRATE 10 MG PO TABS: 10.0000 mg | ORAL_TABLET | Freq: Every day | ORAL | 1 refills | Status: DC

## 2016-08-19 NOTE — Progress Notes (Signed)
Subjective:   Patient ID: [name], female    DOB: 08/28/1945, 71 y.o.   MRN: [id number]  Wealthy Danielski Trzcinski is a pleasant 71 y.o. year old female who presents to clinic today with personal issues; Medication Refill; and Referral (neuro)  on 08/19/2016  HPI:  ? If she should see a neurologist-   Called cardiologist on 5/30 after she had a near syncopal event on [address] while driving.  She felt she was going to pass out but she did not.  Pulled over, drank and ate something and felt better.  Saw Dr. Harrington Challenger, cardiology, on 08/11/16. Note reviewed.  Echo, carotid dopplers done- unremarkable. EKG - LBBB unchanged.  She is currently wearing a holter monitor.  She is here today because she is very scared that the symptoms will return, and that it may be something neurological.  "I just haven't felt quite right since." No headaches.  Does have intermittent "tingling" feeling in her head. No UE or LE weakness. No speech difficulty.  Neg MRI/MRA of brain in 08/2013.  Insomnia, prolonged grief and depression- has been doing well with lexapro since her husband's death and would like to continue this. She did try trazodone shortly after his unexpected death but it made her feel groggy.  Only Lorrin Mais has been effective without side effects the following day.      Current Outpatient Prescriptions on File Prior to Visit  Medication Sig Dispense Refill  . acetaminophen (TYLENOL) 500 MG tablet Take 1,000 mg by mouth every 6 (six) hours as needed for moderate pain or headache.    Marland Kitchen amLODipine (NORVASC) 2.5 MG tablet Take 1 tablet (2.5 mg total) by mouth daily. 90 tablet 3  . carvedilol (COREG) 12.5 MG tablet TAKE ONE TABLET TWICE DAILY WITH A MEAL 180 tablet 3  . Cholecalciferol (VITAMIN D3) 2000 units capsule Take 2,000 Units by mouth daily.    . enalapril (VASOTEC) 10 MG tablet Take 1 tablet (10 mg total) by mouth daily. 90 tablet 3  . escitalopram (LEXAPRO) 20 MG tablet Take 1 tablet (20 mg  total) by mouth daily. 30 tablet 11  . metroNIDAZOLE (METROCREAM) 0.75 % cream Apply 1 application topically at bedtime.    . Multiple Vitamin (MULTIVITAMIN) capsule Take 1 capsule by mouth daily.      . pantoprazole (PROTONIX) 40 MG tablet Take 1 tablet (40 mg total) by mouth 2 (two) times daily. 180 tablet 3  . promethazine (PHENERGAN) 12.5 MG tablet TAKE ONE TABLET BY MOUTH EVERY 8 HOURS AS NEEDED FOR NAUSEA/VOMITING 20 tablet 0  . simvastatin (ZOCOR) 20 MG tablet Take 1 tablet (20 mg total) by mouth at bedtime. 90 tablet 3  . tiZANidine (ZANAFLEX) 2 MG tablet TAKE ONE TABLET BY MOUTH EVERY EIGHT HOURS AS NEEDED FOR MUSCLE SPASM 30 tablet 1   No current facility-administered medications on file prior to visit.     Allergies  Allergen Reactions  . Penicillins Anaphylaxis and Hives    Has patient had a PCN reaction causing immediate rash, facial/tongue/throat swelling, SOB or lightheadedness with hypotension: {Yes Has patient had a PCN reaction causing severe rash involving mucus membranes or skin necrosis: {no Has patient had a PCN reaction that required hospitalization {yes Has patient had a PCN reaction occurring within the last 10 years: {no If all of the above answers are "NO", then may proceed with Cephalosporin use.  . Lansoprazole     REACTION: headaches  . Sulfa Antibiotics Hives  . Ceclor [  Cefaclor] Itching and Rash  . Ceftin [Cefuroxime Axetil] Rash  . Clindamycin/Lincomycin Itching and Rash  . Latex Itching and Rash  . Lincomycin Hcl Itching and Rash  . Septra [Sulfamethoxazole-Trimethoprim] Rash    Past Medical History:  Diagnosis Date  . Cardiomyopathy    nonischemic  . Complication of anesthesia   . Erosive esophagitis    LA Classification Grade B  . GERD (gastroesophageal reflux disease)   . Hiatal hernia   . Hyperlipidemia   . Melanoma ([place])    basil cell, pt reports no melanoma  . Migraine   . Osteopenia   . PONV (postoperative nausea and vomiting)   .  Sessile colonic polyp 10/2011   adenoma    Past Surgical History:  Procedure Laterality Date  . APPENDECTOMY    . CARDIAC CATHETERIZATION    . CATARACT EXTRACTION W/PHACO Right 03/18/2016   Procedure: CATARACT EXTRACTION PHACO AND INTRAOCULAR LENS PLACEMENT (IOC);  Surgeon: Birder [name], MD;  Location: [facility];  Service: Ophthalmology;  Laterality: Right;  [place] 45.3 AP% 22.2 CDE 10.07 Fluid pack lot # [id number]  . CATARACT EXTRACTION W/PHACO Left 04/08/2016   Procedure: CATARACT EXTRACTION PHACO AND INTRAOCULAR LENS PLACEMENT (IOC);  Surgeon: Birder [name], MD;  Location: [facility];  Service: Ophthalmology;  Laterality: Left;  [place] 00:41 AP% 15.0 CDE 6.27 Fluid pack lot # [id number]  . TUBAL LIGATION    . VAGINAL HYSTERECTOMY      Family History  Problem Relation Age of Onset  . Alzheimer's disease Mother   . Heart disease Father   . Arthritis/Rheumatoid Sister   . Colon cancer Neg Hx   . Stomach cancer Neg Hx   . Rectal cancer Neg Hx   . Esophageal cancer Neg Hx     Social History   Social History  . Marital status: Widowed    Spouse name: N/A  . Number of children: 3  . Years of education: N/A   Occupational History  . Not on file.   Social History Main Topics  . Smoking status: Former Smoker    Quit date: 06/03/1981  . Smokeless tobacco: Never Used  . Alcohol use 1.8 oz/week    3 Glasses of wine per week     Comment: Wine on occasional  . Drug use: No  . Sexual activity: Not on file   Other Topics Concern  . Not on file   Social History Narrative   Lives alone in two-story home.     Her husband passed away unexpectedly.  They have three children- one is a pediatrician.   Desires CPR.   The PMH, PSH, Social History, Family History, Medications, and allergies have been reviewed in [facility], and have been updated if relevant.   Review of Systems  Neurological: Positive for dizziness, light-headedness and numbness. Negative for tremors, seizures, syncope, facial  asymmetry, speech difficulty, weakness and headaches.  Psychiatric/Behavioral: Positive for sleep disturbance. Negative for agitation, behavioral problems, confusion, decreased concentration, dysphoric mood, hallucinations, self-injury and suicidal ideas. The patient is not nervous/anxious and is not hyperactive.   All other systems reviewed and are negative.      Objective:    BP 140/80   Pulse 60   Ht 5\' 2"  (1.575 m)   Wt 134 lb (60.8 kg)   SpO2 99%   BMI 24.51 kg/m    Physical Exam  Constitutional: She is oriented to person, place, and time. She appears well-developed and well-nourished. No distress.  HENT:  Head: Normocephalic and atraumatic.  Eyes: Conjunctivae are normal.  Cardiovascular: Normal rate.   Pulmonary/Chest: Effort normal.  Musculoskeletal: Normal range of motion. She exhibits no edema.  Neurological: She is alert and oriented to person, place, and time. No cranial nerve deficit.  Skin: Skin is warm and dry. She is not diaphoretic.  Psychiatric: She has a normal mood and affect. Her behavior is normal. Judgment and thought content normal.  Nursing note and vitals reviewed.         Assessment & Plan:   Depression, unspecified depression type  Insomnia, unspecified type  Near syncope - Plan: Ambulatory referral to Neurology No Follow-up on file.

## 2016-08-19 NOTE — Assessment & Plan Note (Signed)
Continue current rxs. Ambien rx printed and given to pt.

## 2016-08-19 NOTE — Assessment & Plan Note (Signed)
Agree neurology work up is appropriate. Referral placed.

## 2016-08-19 NOTE — Patient Instructions (Signed)
Great to see you. Please stop by to see Marion on your way out.   

## 2016-08-27 ENCOUNTER — Encounter (HOSPITAL_COMMUNITY): Payer: Medicare HMO

## 2016-08-27 ENCOUNTER — Other Ambulatory Visit (HOSPITAL_COMMUNITY): Payer: Medicare HMO

## 2016-08-28 ENCOUNTER — Ambulatory Visit (INDEPENDENT_AMBULATORY_CARE_PROVIDER_SITE_OTHER): Payer: Medicare HMO | Admitting: Neurology

## 2016-08-28 ENCOUNTER — Encounter: Payer: Self-pay | Admitting: Neurology

## 2016-08-28 VITALS — BP 132/75 | HR 58 | Ht 62.0 in | Wt 133.0 lb

## 2016-08-28 DIAGNOSIS — R55 Syncope and collapse: Secondary | ICD-10-CM

## 2016-08-28 NOTE — Patient Instructions (Signed)
    We will check MRI of the brain. 

## 2016-08-28 NOTE — Progress Notes (Signed)
Reason for visit: Near syncope  Referring physician: Dr. Burman Nieves [name] is a 71 y.o. female  History of present illness:  [name] is a 71 year old right-handed white female with a history of an episode of near syncope that occurred on 08/06/2016. [place] she began to feel weak all over as if her arms were heavy. The patient pulled over and noted a "white out" of vision. The patient felt as if she was "going to go out of the world". The patient does not recall any chest pain or palpitations of the heart. She does not recall blacking out. The patient felt floaty headed during the episode. She had symptoms for less than 5 minutes with full recovery, she was able to drive afterwards. The patient did not have a headache immediately following the event. The patient does have a history of migraine headaches in the past however, but she has never had visual complaints such as this with her migraine. The patient does have bilateral carpal tunnel syndrome, otherwise she does not have any numbness of the extremities with exception of some slight numbness of the left foot. The patient denies weakness of the extremities. She has had some mild gait instability, she denies issues controlling the bowels or the bladder. She has been seen through cardiology, a 2-D echocardiogram has been done and shows an ejection fraction of 40-45%. The patient claims that she has had a carotid Doppler study as well and that this study was normal. She currently is wearing a 30 day cardiac monitor. She denies any recurrence of symptoms. She is sent to this office for an evaluation.  The patient also notes different events of brief lightheaded sensations that may last less than one second. The events occur daily and have been going on for about 6 months. The patient has been on Lexapro taking 20 mg daily for several years.  Past Medical History:  Diagnosis Date  . Cardiomyopathy    nonischemic  . Complication of anesthesia   . Erosive esophagitis    [place] Classification Grade B  . GERD (gastroesophageal reflux disease)   . Hiatal hernia   . Hyperlipidemia   . Melanoma ([place])    basil cell, pt reports no melanoma  . Migraine   . Osteopenia   . PONV (postoperative nausea and vomiting)   . Sessile colonic polyp 10/2011   adenoma    Past Surgical History:  Procedure Laterality Date  . APPENDECTOMY    . CARDIAC CATHETERIZATION    . CATARACT EXTRACTION W/PHACO Right 03/18/2016   Procedure: CATARACT EXTRACTION PHACO AND INTRAOCULAR LENS PLACEMENT (IOC);  Surgeon: Birder [name], MD;  Location: [facility] ORS;  Service: Ophthalmology;  Laterality: Right;  [place] 45.3 AP% 22.2 CDE 10.07 Fluid pack lot # [id number]  . CATARACT EXTRACTION W/PHACO Left 04/08/2016   Procedure: CATARACT EXTRACTION PHACO AND INTRAOCULAR LENS PLACEMENT (IOC);  Surgeon: Birder [name], MD;  Location: [facility] ORS;  Service: Ophthalmology;  Laterality: Left;  [place] 00:41 AP% 15.0 CDE 6.27 Fluid pack lot # [id number]  . TUBAL LIGATION    . VAGINAL HYSTERECTOMY      Family History  Problem Relation Age of Onset  . Alzheimer's disease Mother   . Heart disease Father   . Arthritis/Rheumatoid Sister   . Colon cancer Neg Hx   . Stomach cancer Neg Hx   . Rectal cancer Neg Hx   . Esophageal cancer Neg Hx     Social  history:  reports that she quit smoking about 35 years ago. She has never used smokeless tobacco. She reports that she drinks about 1.8 oz of alcohol per week . She reports that she does not use drugs.  Medications:  Prior to Admission medications   Medication Sig Start Date End Date Taking? Authorizing Provider  acetaminophen (TYLENOL) 500 MG tablet Take 1,000 mg by mouth every 6 (six) hours as needed for moderate pain or headache.   Yes [provider]  amLODipine (NORVASC) 2.5 MG tablet Take 1 tablet (2.5 mg total) by mouth daily. 04/14/16  Yes Fay Records, MD  carvedilol (COREG) 12.5  MG tablet TAKE ONE TABLET TWICE DAILY WITH A MEAL 04/14/16  Yes Fay Records, MD  Cholecalciferol (VITAMIN D3) 2000 units capsule Take 2,000 Units by mouth daily.   Yes [provider]  enalapril (VASOTEC) 10 MG tablet Take 1 tablet (10 mg total) by mouth daily. 04/14/16  Yes Fay Records, MD  escitalopram (LEXAPRO) 20 MG tablet Take 1 tablet (20 mg total) by mouth daily. 07/09/16  Yes Lucille Passy, MD  metroNIDAZOLE (METROCREAM) 0.75 % cream Apply 1 application topically at bedtime. 03/25/16  Yes [provider]  Multiple Vitamin (MULTIVITAMIN) capsule Take 1 capsule by mouth daily.     Yes [provider]  pantoprazole (PROTONIX) 40 MG tablet Take 1 tablet (40 mg total) by mouth 2 (two) times daily. 07/02/16  Yes Ladene Artist, MD  promethazine (PHENERGAN) 12.5 MG tablet TAKE ONE TABLET BY MOUTH EVERY 8 HOURS AS NEEDED FOR NAUSEA/VOMITING 05/22/16  Yes Lucille Passy, MD  simvastatin (ZOCOR) 20 MG tablet Take 1 tablet (20 mg total) by mouth at bedtime. 04/14/16  Yes Fay Records, MD  tiZANidine (ZANAFLEX) 2 MG tablet TAKE ONE TABLET BY MOUTH EVERY EIGHT HOURS AS NEEDED FOR MUSCLE SPASM 06/16/16  Yes Lucille Passy, MD  zolpidem (AMBIEN) 10 MG tablet Take 1 tablet (10 mg total) by mouth at bedtime. 08/19/16  Yes Lucille Passy, MD      Allergies  Allergen Reactions  . Penicillins Anaphylaxis and Hives    Has patient had a PCN reaction causing immediate rash, facial/tongue/throat swelling, SOB or lightheadedness with hypotension: {Yes Has patient had a PCN reaction causing severe rash involving mucus membranes or skin necrosis: {no Has patient had a PCN reaction that required hospitalization {yes Has patient had a PCN reaction occurring within the last 10 years: {no If all of the above answers are "NO", then may proceed with Cephalosporin use.  . Lansoprazole     REACTION: headaches  . Sulfa Antibiotics Hives  . Ceclor [Cefaclor] Itching and Rash  . Ceftin [Cefuroxime  Axetil] Rash  . Clindamycin/Lincomycin Itching and Rash  . Latex Itching and Rash  . Lincomycin Hcl Itching and Rash  . Septra [Sulfamethoxazole-Trimethoprim] Rash    ROS:  Out of a complete 14 system review of symptoms, the patient complains only of the following symptoms, and all other reviewed systems are negative.  Chest pain Blurred vision Weakness, numbness Insomnia  Blood pressure 132/75, pulse (!) 58, height 5\' 2"  (1.575 m), weight 133 lb (60.3 kg).  Physical Exam  General: The patient is alert and cooperative at the time of the examination.  Eyes: Pupils are equal, round, and reactive to light. Discs are flat bilaterally.  Neck: The neck is supple, no carotid bruits are noted.  Respiratory: The respiratory examination is clear.  Cardiovascular: The cardiovascular examination reveals a  regular rate and rhythm, no obvious murmurs or rubs are noted.  Skin: Extremities are without significant edema.  Neurologic Exam  Mental status: The patient is alert and oriented x 3 at the time of the examination. The patient has apparent normal recent and remote memory, with an apparently normal attention span and concentration ability.  Cranial nerves: Facial symmetry is present. There is good sensation of the face to pinprick and soft touch bilaterally. The strength of the facial muscles and the muscles to head turning and shoulder shrug are normal bilaterally. Speech is well enunciated, no aphasia or dysarthria is noted. Extraocular movements are full. Visual fields are full. The tongue is midline, and the patient has symmetric elevation of the soft palate. No obvious hearing deficits are noted.  Motor: The motor testing reveals 5 over 5 strength of all 4 extremities. Good symmetric motor tone is noted throughout.  Sensory: Sensory testing is intact to pinprick, soft touch, vibration sensation, and position sense on all 4 extremities. No evidence of extinction is  noted.  Coordination: Cerebellar testing reveals good finger-nose-finger and heel-to-shin bilaterally.  Gait and station: Gait is normal. Tandem gait is slightly unsteady. Romberg is negative. No drift is seen.  Reflexes: Deep tendon reflexes are symmetric and normal bilaterally. Toes are downgoing bilaterally.   MRI brain MRA head 08/09/13:  IMPRESSION: Mild atrophy and small vessel disease. No acute intracranial findings.  Mild intracranial atherosclerotic change. No proximal carotid or basilar stenosis.  * MRI scan images were reviewed online. I agree with the written report.   2D echo 08/13/16:  Study Conclusions  - Left ventricle: moderate basal septal hypertrophy Inferobasal   posterior lateral and septal hypolinesis. The cavity size was   moderately dilated. Systolic function was mildly to moderately   reduced. The estimated ejection fraction was in the range of 40%   to 45%. Left ventricular diastolic function parameters were   normal. - Left atrium: The atrium was mildly dilated. - Atrial septum: No defect or patent foramen ovale was identified.    Assessment/Plan:  1. Near syncopal event  2. Brief episodes of dizziness  The patient has been on Lexapro for a number of years, she is having brief episodes of dizziness associated with this. The patient has had an event of near-syncope, she reported a white out of vision which usually does not occur with events that result in a drop in blood pressure. Such events may be more likely with migraine or with panic attacks. The patient is undergoing a full cardiac workup, we will check MRI of the brain. If these studies are unremarkable, the patient will be watched on a conservative basis.  Jill Alexanders MD 08/28/2016 11:47 AM  [facility] [address] [place], [place] [address]  Phone [phone] Fax [phone]

## 2016-09-03 ENCOUNTER — Encounter: Payer: Self-pay | Admitting: Gastroenterology

## 2016-09-05 ENCOUNTER — Telehealth: Payer: Self-pay | Admitting: *Deleted

## 2016-09-05 ENCOUNTER — Telehealth: Payer: Self-pay | Admitting: Internal Medicine

## 2016-09-05 NOTE — Telephone Encounter (Signed)
Received notification from [employer] cardiac monitoring service that patient had 8 beats of nonsustained VT on monitoring device. Attempt was made to contact patient, voicemail left with return instructions. Will alert Dr. Harrington Challenger and team so that additional attempt can be made to contact patient.  Buford Dresser, cardiology overnight physician.

## 2016-09-05 NOTE — Telephone Encounter (Signed)
Received call back from patient-reports she was asleep during time of event, asymptomatic at current.  Patient is on vacation and reports feeling good, has not had any episodes or symptoms.   Advised we would contact her if Dr. Harrington Challenger has any further recommendations.

## 2016-09-05 NOTE — Telephone Encounter (Signed)
Received critical notification from [employer]: 10 beats of Vtach (rate of 151) occurred at 1:17AM on 6/29.   Auto trigger.   Attempt to contact patient, left message on cell and home phone to call back.    Dr. Harrington Challenger aware, no new orders at this time.

## 2016-09-05 NOTE — Telephone Encounter (Signed)
Left message to call back  

## 2016-09-05 NOTE — Telephone Encounter (Signed)
Follow up    Pt is calling back for Hayley.

## 2016-09-12 ENCOUNTER — Other Ambulatory Visit: Payer: Medicare HMO

## 2016-09-13 ENCOUNTER — Ambulatory Visit
Admission: RE | Admit: 2016-09-13 | Discharge: 2016-09-13 | Disposition: A | Discharge status: Home / Self Care | Payer: Medicare HMO | Source: Ambulatory Visit | Attending: Neurology | Admitting: Neurology

## 2016-09-13 DIAGNOSIS — R55 Syncope and collapse: Secondary | ICD-10-CM | POA: Diagnosis not present

## 2016-09-14 ENCOUNTER — Telehealth: Payer: Self-pay | Admitting: Neurology

## 2016-09-14 NOTE — Telephone Encounter (Signed)
I called the patient. MRI the brain shows very nonspecific white matter changes in the brain, no change from 2015.   MRI brain 09/11/16:  IMPRESSION: Abnormal MRI scan of brain showing mild changes of chronic microvascular ischemia and generalized mild cerebral atrophy. No significant changes compared with MRI 08/09/2013.

## 2016-09-17 DIAGNOSIS — Z1231 Encounter for screening mammogram for malignant neoplasm of breast: Secondary | ICD-10-CM | POA: Diagnosis not present

## 2016-09-18 ENCOUNTER — Encounter: Payer: Self-pay | Admitting: Family Medicine

## 2016-09-18 ENCOUNTER — Telehealth: Payer: Self-pay | Admitting: Internal Medicine

## 2016-09-18 NOTE — Telephone Encounter (Signed)
Patient calling to event monitor results. Patient made aware that results have not been reviewed by Dr. Harrington Challenger yet and that we would call her once she results them. Patient verbalized understanding and thanked me for the call.

## 2016-09-18 NOTE — Telephone Encounter (Signed)
Pt asking for a call back as she has a few more questions re: the MRI that she failed to ask when she spoke with Dr Jannifer Franklin on Sunday the 8th, please call.

## 2016-09-18 NOTE — Telephone Encounter (Signed)
New message      Calling to get event monitor results

## 2016-09-18 NOTE — Telephone Encounter (Signed)
I called patient back. The white matter changes are quite minimal on MRI, the patient does have a history of migraine headaches in this could be consistent with this history. Nothing on the MRI would explain the episode of near-syncope.

## 2016-09-23 ENCOUNTER — Encounter: Payer: Self-pay | Admitting: *Deleted

## 2016-09-23 DIAGNOSIS — H04123 Dry eye syndrome of bilateral lacrimal glands: Secondary | ICD-10-CM | POA: Diagnosis not present

## 2016-09-29 DIAGNOSIS — M3501 Sicca syndrome with keratoconjunctivitis: Secondary | ICD-10-CM | POA: Diagnosis not present

## 2016-10-07 ENCOUNTER — Encounter: Payer: Self-pay | Admitting: Internal Medicine

## 2016-10-07 ENCOUNTER — Ambulatory Visit (INDEPENDENT_AMBULATORY_CARE_PROVIDER_SITE_OTHER): Payer: Medicare HMO | Admitting: Internal Medicine

## 2016-10-07 VITALS — BP 120/70 | HR 68 | Ht 62.0 in | Wt 132.4 lb

## 2016-10-07 DIAGNOSIS — I447 Left bundle-branch block, unspecified: Secondary | ICD-10-CM | POA: Diagnosis not present

## 2016-10-07 DIAGNOSIS — I429 Cardiomyopathy, unspecified: Secondary | ICD-10-CM | POA: Diagnosis not present

## 2016-10-07 DIAGNOSIS — R55 Syncope and collapse: Secondary | ICD-10-CM

## 2016-10-07 NOTE — Patient Instructions (Addendum)
Medication Instructions:  Your physician recommends that you continue on your current medications as directed. Please refer to the Current Medication list given to you today.   Labwork: None ordered.  Testing/Procedures: Dr. Lovena Le recommends you get a loop insertion to monitor your heart rhythms.  The loop insertion is set up for 10/10/2016 @ 730 am.  Please arrive to admissions @ 630 am.     Follow-Up:  You will follow up with the device clinic 7-10 days after insertion for a wound check.  You will follow up with Dr. Lovena Le 91 days after insertion.     Any Other Special Instructions Will Be Listed Below (If Applicable).     If you need a refill on your cardiac medications before your next appointment, please call your pharmacy.

## 2016-10-07 NOTE — Progress Notes (Signed)
HPI [name] is referred today by Dr. Harrington Challenger for evaluation of syncope. She is a pleasant 71 yo woman with a h/o a fairly mild cardiomyopathy, EF by echo 45%, with LBBB. She was driving when she had an episode where she felt like she was floating. She was able to get her car to the side of the road and does not think but is not sure that she lost consciousness. She has undegone an extensive workup which has been negative. She has a QRS duration of 172ms. This has been unchanged for several years. She has class 1 symptoms. No edema. Allergies  Allergen Reactions  . Penicillins Anaphylaxis and Hives    Has patient had a PCN reaction causing immediate rash, facial/tongue/throat swelling, SOB or lightheadedness with hypotension: {Yes Has patient had a PCN reaction causing severe rash involving mucus membranes or skin necrosis: {no Has patient had a PCN reaction that required hospitalization {yes Has patient had a PCN reaction occurring within the last 10 years: {no If all of the above answers are "NO", then may proceed with Cephalosporin use.  . Lansoprazole     REACTION: headaches  . Sulfa Antibiotics Hives  . Ceclor [Cefaclor] Itching and Rash  . Ceftin [Cefuroxime Axetil] Rash  . Clindamycin/Lincomycin Itching and Rash  . Latex Itching and Rash  . Lincomycin Hcl Itching and Rash  . Septra [Sulfamethoxazole-Trimethoprim] Rash     Current Outpatient Prescriptions  Medication Sig Dispense Refill  . acetaminophen (TYLENOL) 500 MG tablet Take 1,000 mg by mouth every 6 (six) hours as needed for moderate pain or headache.    Marland Kitchen amLODipine (NORVASC) 2.5 MG tablet Take 1 tablet (2.5 mg total) by mouth daily. 90 tablet 3  . carvedilol (COREG) 12.5 MG tablet TAKE ONE TABLET TWICE DAILY WITH A MEAL 180 tablet 3  . Cholecalciferol (VITAMIN D3) 2000 units capsule Take 2,000 Units by mouth daily.    . enalapril (VASOTEC) 10 MG tablet Take 1 tablet (10 mg total) by mouth daily. 90 tablet 3  .  escitalopram (LEXAPRO) 20 MG tablet Take 1 tablet (20 mg total) by mouth daily. 30 tablet 11  . metroNIDAZOLE (METROCREAM) 0.75 % cream Apply 1 application topically at bedtime.    . Multiple Vitamin (MULTIVITAMIN) capsule Take 1 capsule by mouth daily.      . pantoprazole (PROTONIX) 40 MG tablet Take 1 tablet (40 mg total) by mouth 2 (two) times daily. 180 tablet 3  . promethazine (PHENERGAN) 12.5 MG tablet TAKE ONE TABLET BY MOUTH EVERY 8 HOURS AS NEEDED FOR NAUSEA/VOMITING 20 tablet 0  . simvastatin (ZOCOR) 20 MG tablet Take 1 tablet (20 mg total) by mouth at bedtime. 90 tablet 3  . tiZANidine (ZANAFLEX) 2 MG tablet TAKE ONE TABLET BY MOUTH EVERY EIGHT HOURS AS NEEDED FOR MUSCLE SPASM 30 tablet 1  . zolpidem (AMBIEN) 10 MG tablet Take 1 tablet (10 mg total) by mouth at bedtime. 30 tablet 1   No current facility-administered medications for this visit.      Past Medical History:  Diagnosis Date  . Cardiomyopathy    nonischemic  . Complication of anesthesia   . Erosive esophagitis    LA Classification Grade B  . GERD (gastroesophageal reflux disease)   . Hiatal hernia   . Hyperlipidemia   . Melanoma ([place])    basil cell, pt reports no melanoma  . Migraine   . Osteopenia   . PONV (postoperative nausea and vomiting)   .  Sessile colonic polyp 10/2011   adenoma    ROS:   All systems reviewed and negative except as noted in the HPI.   Past Surgical History:  Procedure Laterality Date  . APPENDECTOMY    . CARDIAC CATHETERIZATION    . CATARACT EXTRACTION W/PHACO Right 03/18/2016   Procedure: CATARACT EXTRACTION PHACO AND INTRAOCULAR LENS PLACEMENT (IOC);  Surgeon: Birder [name], MD;  Location: [facility] ORS;  Service: Ophthalmology;  Laterality: Right;  [place] 45.3 AP% 22.2 CDE 10.07 Fluid pack lot # [id number]  . CATARACT EXTRACTION W/PHACO Left 04/08/2016   Procedure: CATARACT EXTRACTION PHACO AND INTRAOCULAR LENS PLACEMENT (IOC);  Surgeon: Birder [name], MD;  Location: [facility] ORS;   Service: Ophthalmology;  Laterality: Left;  [place] 00:41 AP% 15.0 CDE 6.27 Fluid pack lot # [id number]  . TUBAL LIGATION    . VAGINAL HYSTERECTOMY       Family History  Problem Relation Age of Onset  . Alzheimer's disease Mother   . Heart disease Father   . Arthritis/Rheumatoid Sister   . Colon cancer Neg Hx   . Stomach cancer Neg Hx   . Rectal cancer Neg Hx   . Esophageal cancer Neg Hx      Social History   Social History  . Marital status: Widowed    Spouse name: N/A  . Number of children: 3  . Years of education: 5   Occupational History  . Not on file.   Social History Main Topics  . Smoking status: Former Smoker    Quit date: 06/03/1981  . Smokeless tobacco: Never Used  . Alcohol use 1.8 oz/week    3 Glasses of wine per week     Comment: Wine per night  . Drug use: No  . Sexual activity: Not on file   Other Topics Concern  . Not on file   Social History Narrative   Lives alone in two-story home.     Her husband passed away unexpectedly.     They have three children- one is a pediatrician.   Desires CPR.   Caffeine use: Decaf coffee   Right handed     BP 120/70   Pulse 68   Ht 5\' 2"  (1.575 m)   Wt 132 lb 6.4 oz (60.1 kg)   SpO2 96%   BMI 24.22 kg/m   Physical Exam:  Well appearing NAD HEENT: Unremarkable Neck:  No JVD, no thyromegally Lymphatics:  No adenopathy Back:  No CVA tenderness Lungs:  Clear HEART:  Regular rate rhythm, no murmurs, no rubs, no clicks Abd:  soft, positive bowel sounds, no organomegally, no rebound, no guarding Ext:  2 plus pulses, no edema, no cyanosis, no clubbing Skin:  No rashes no nodules Neuro:  CN II through XII intact, motor grossly intact  EKG -reviewed  Cardiac monitor Normal device function.  See PaceArt for details.   Assess/Plan: 1. Altered mental status/possible syncope - I have discussed the treatment options with the patient. I have recommended she undergo insertion of an ILR.  2. Chronic systolic  heart failure - her symptoms are class 1. Her EF is 45%. Continue her current medical therapy.  3. LBBB - her QRS duration has not changed in years. I suspect that her episode was due to transient heart block. The ILR should help [place] make a diagnosis.  Mikle Bosworth.D

## 2016-10-10 ENCOUNTER — Encounter (HOSPITAL_COMMUNITY)
Admission: RE | Disposition: A | Discharge status: Home / Self Care | Payer: Self-pay | Source: Ambulatory Visit | Attending: Internal Medicine

## 2016-10-10 ENCOUNTER — Ambulatory Visit (HOSPITAL_COMMUNITY)
Admission: RE | Admit: 2016-10-10 | Discharge: 2016-10-10 | Disposition: A | Discharge status: Home / Self Care | Payer: Medicare HMO | Source: Ambulatory Visit | Attending: Internal Medicine | Admitting: Internal Medicine

## 2016-10-10 ENCOUNTER — Encounter (HOSPITAL_COMMUNITY): Payer: Self-pay | Admitting: Internal Medicine

## 2016-10-10 DIAGNOSIS — M8580 Other specified disorders of bone density and structure, unspecified site: Secondary | ICD-10-CM | POA: Diagnosis not present

## 2016-10-10 DIAGNOSIS — I447 Left bundle-branch block, unspecified: Secondary | ICD-10-CM | POA: Diagnosis not present

## 2016-10-10 DIAGNOSIS — R55 Syncope and collapse: Secondary | ICD-10-CM | POA: Insufficient documentation

## 2016-10-10 DIAGNOSIS — E785 Hyperlipidemia, unspecified: Secondary | ICD-10-CM | POA: Diagnosis not present

## 2016-10-10 DIAGNOSIS — I429 Cardiomyopathy, unspecified: Secondary | ICD-10-CM | POA: Insufficient documentation

## 2016-10-10 DIAGNOSIS — G43909 Migraine, unspecified, not intractable, without status migrainosus: Secondary | ICD-10-CM | POA: Insufficient documentation

## 2016-10-10 DIAGNOSIS — I5022 Chronic systolic (congestive) heart failure: Secondary | ICD-10-CM | POA: Insufficient documentation

## 2016-10-10 DIAGNOSIS — K219 Gastro-esophageal reflux disease without esophagitis: Secondary | ICD-10-CM | POA: Insufficient documentation

## 2016-10-10 DIAGNOSIS — Z87891 Personal history of nicotine dependence: Secondary | ICD-10-CM | POA: Diagnosis not present

## 2016-10-10 DIAGNOSIS — Z880 Allergy status to penicillin: Secondary | ICD-10-CM | POA: Insufficient documentation

## 2016-10-10 DIAGNOSIS — Z91040 Latex allergy status: Secondary | ICD-10-CM | POA: Diagnosis not present

## 2016-10-10 DIAGNOSIS — Z882 Allergy status to sulfonamides status: Secondary | ICD-10-CM | POA: Diagnosis not present

## 2016-10-10 HISTORY — PX: LOOP RECORDER INSERTION: EP1214

## 2016-10-10 SURGERY — LOOP RECORDER INSERTION

## 2016-10-10 MED ORDER — LIDOCAINE-EPINEPHRINE 1 %-1:100000 IJ SOLN
INTRAMUSCULAR | Status: AC
  Filled 2016-10-10: qty 1

## 2016-10-10 MED ORDER — LIDOCAINE-EPINEPHRINE 1 %-1:100000 IJ SOLN
INTRAMUSCULAR | Status: DC | PRN
  Administered 2016-10-10: 20 mL

## 2016-10-10 SURGICAL SUPPLY — 2 items
LOOP REVEAL LINQSYS (Prosthesis & Implant Heart) ×2 IMPLANT
PACK LOOP INSERTION (CUSTOM PROCEDURE TRAY) ×2 IMPLANT

## 2016-10-10 NOTE — Interval H&P Note (Signed)
History and Physical Interval Note:  10/10/2016 7:25 AM  [name]  has presented today for surgery, with the diagnosis of syncope  The various methods of treatment have been discussed with the patient and family. After consideration of risks, benefits and other options for treatment, the patient has consented to  Procedure(s): Loop Recorder Insertion (N/A) as a surgical intervention .  The patient's history has been reviewed, patient examined, no change in status, stable for surgery.  I have reviewed the patient's chart and labs.  Questions were answered to the patient's satisfaction.     [name]

## 2016-10-10 NOTE — H&P (View-Only) (Signed)
HPI [name] is referred today by Dr. Harrington Challenger for evaluation of syncope. She is a pleasant 71 yo woman with a h/o a fairly mild cardiomyopathy, EF by echo 45%, with LBBB. She was driving when she had an episode where she felt like she was floating. She was able to get her car to the side of the road and does not think but is not sure that she lost consciousness. She has undegone an extensive workup which has been negative. She has a QRS duration of 114ms. This has been unchanged for several years. She has class 1 symptoms. No edema. Allergies  Allergen Reactions  . Penicillins Anaphylaxis and Hives    Has patient had a PCN reaction causing immediate rash, facial/tongue/throat swelling, SOB or lightheadedness with hypotension: {Yes Has patient had a PCN reaction causing severe rash involving mucus membranes or skin necrosis: {no Has patient had a PCN reaction that required hospitalization {yes Has patient had a PCN reaction occurring within the last 10 years: {no If all of the above answers are "NO", then may proceed with Cephalosporin use.  . Lansoprazole     REACTION: headaches  . Sulfa Antibiotics Hives  . Ceclor [Cefaclor] Itching and Rash  . Ceftin [Cefuroxime Axetil] Rash  . Clindamycin/Lincomycin Itching and Rash  . Latex Itching and Rash  . Lincomycin Hcl Itching and Rash  . Septra [Sulfamethoxazole-Trimethoprim] Rash     Current Outpatient Prescriptions  Medication Sig Dispense Refill  . acetaminophen (TYLENOL) 500 MG tablet Take 1,000 mg by mouth every 6 (six) hours as needed for moderate pain or headache.    Marland Kitchen amLODipine (NORVASC) 2.5 MG tablet Take 1 tablet (2.5 mg total) by mouth daily. 90 tablet 3  . carvedilol (COREG) 12.5 MG tablet TAKE ONE TABLET TWICE DAILY WITH A MEAL 180 tablet 3  . Cholecalciferol (VITAMIN D3) 2000 units capsule Take 2,000 Units by mouth daily.    . enalapril (VASOTEC) 10 MG tablet Take 1 tablet (10 mg total) by mouth daily. 90 tablet 3  .  escitalopram (LEXAPRO) 20 MG tablet Take 1 tablet (20 mg total) by mouth daily. 30 tablet 11  . metroNIDAZOLE (METROCREAM) 0.75 % cream Apply 1 application topically at bedtime.    . Multiple Vitamin (MULTIVITAMIN) capsule Take 1 capsule by mouth daily.      . pantoprazole (PROTONIX) 40 MG tablet Take 1 tablet (40 mg total) by mouth 2 (two) times daily. 180 tablet 3  . promethazine (PHENERGAN) 12.5 MG tablet TAKE ONE TABLET BY MOUTH EVERY 8 HOURS AS NEEDED FOR NAUSEA/VOMITING 20 tablet 0  . simvastatin (ZOCOR) 20 MG tablet Take 1 tablet (20 mg total) by mouth at bedtime. 90 tablet 3  . tiZANidine (ZANAFLEX) 2 MG tablet TAKE ONE TABLET BY MOUTH EVERY EIGHT HOURS AS NEEDED FOR MUSCLE SPASM 30 tablet 1  . zolpidem (AMBIEN) 10 MG tablet Take 1 tablet (10 mg total) by mouth at bedtime. 30 tablet 1   No current facility-administered medications for this visit.      Past Medical History:  Diagnosis Date  . Cardiomyopathy    nonischemic  . Complication of anesthesia   . Erosive esophagitis    LA Classification Grade B  . GERD (gastroesophageal reflux disease)   . Hiatal hernia   . Hyperlipidemia   . Melanoma ([place])    basil cell, pt reports no melanoma  . Migraine   . Osteopenia   . PONV (postoperative nausea and vomiting)   .  Sessile colonic polyp 10/2011   adenoma    ROS:   All systems reviewed and negative except as noted in the HPI.   Past Surgical History:  Procedure Laterality Date  . APPENDECTOMY    . CARDIAC CATHETERIZATION    . CATARACT EXTRACTION W/PHACO Right 03/18/2016   Procedure: CATARACT EXTRACTION PHACO AND INTRAOCULAR LENS PLACEMENT (IOC);  Surgeon: Birder [name], MD;  Location: [facility];  Service: Ophthalmology;  Laterality: Right;  [place] 45.3 AP% 22.2 CDE 10.07 Fluid pack lot # [id number]  . CATARACT EXTRACTION W/PHACO Left 04/08/2016   Procedure: CATARACT EXTRACTION PHACO AND INTRAOCULAR LENS PLACEMENT (IOC);  Surgeon: Birder [name], MD;  Location: [facility];   Service: Ophthalmology;  Laterality: Left;  [place] 00:41 AP% 15.0 CDE 6.27 Fluid pack lot # [id number]  . TUBAL LIGATION    . VAGINAL HYSTERECTOMY       Family History  Problem Relation Age of Onset  . Alzheimer's disease Mother   . Heart disease Father   . Arthritis/Rheumatoid Sister   . Colon cancer Neg Hx   . Stomach cancer Neg Hx   . Rectal cancer Neg Hx   . Esophageal cancer Neg Hx      Social History   Social History  . Marital status: Widowed    Spouse name: N/A  . Number of children: 3  . Years of education: 27   Occupational History  . Not on file.   Social History Main Topics  . Smoking status: Former Smoker    Quit date: 06/03/1981  . Smokeless tobacco: Never Used  . Alcohol use 1.8 oz/week    3 Glasses of wine per week     Comment: Wine per night  . Drug use: No  . Sexual activity: Not on file   Other Topics Concern  . Not on file   Social History Narrative   Lives alone in two-story home.     Her husband passed away unexpectedly.     They have three children- one is a pediatrician.   Desires CPR.   Caffeine use: Decaf coffee   Right handed     BP 120/70   Pulse 68   Ht 5\' 2"  (1.575 m)   Wt 132 lb 6.4 oz (60.1 kg)   SpO2 96%   BMI 24.22 kg/m   Physical Exam:  Well appearing NAD HEENT: Unremarkable Neck:  No JVD, no thyromegally Lymphatics:  No adenopathy Back:  No CVA tenderness Lungs:  Clear HEART:  Regular rate rhythm, no murmurs, no rubs, no clicks Abd:  soft, positive bowel sounds, no organomegally, no rebound, no guarding Ext:  2 plus pulses, no edema, no cyanosis, no clubbing Skin:  No rashes no nodules Neuro:  CN II through XII intact, motor grossly intact  EKG -reviewed  Cardiac monitor Normal device function.  See PaceArt for details.   Assess/Plan: 1. Altered mental status/possible syncope - I have discussed the treatment options with the patient. I have recommended she undergo insertion of an ILR.  2. Chronic systolic  heart failure - her symptoms are class 1. Her EF is 45%. Continue her current medical therapy.  3. LBBB - her QRS duration has not changed in years. I suspect that her episode was due to transient heart block. The ILR should help [place] make a diagnosis.  Mikle Bosworth.D

## 2016-10-20 ENCOUNTER — Other Ambulatory Visit: Payer: Self-pay | Admitting: Family Medicine

## 2016-10-22 NOTE — Telephone Encounter (Signed)
Last filled 07-25-16 #30 Last OV 08-19-16 Next OV 11-25-16

## 2016-10-23 ENCOUNTER — Ambulatory Visit (INDEPENDENT_AMBULATORY_CARE_PROVIDER_SITE_OTHER): Payer: Medicare HMO | Admitting: *Deleted

## 2016-10-23 DIAGNOSIS — R55 Syncope and collapse: Secondary | ICD-10-CM

## 2016-10-23 NOTE — Progress Notes (Signed)
Wound Loop check in clinic. Steri-Strips removed, wound well healed, incision edges approximated.  Battery status: Good. R-waves 0.27 mV. 1 symptom episodes from implant. 0 tachy episodes, 0 pause episodes, 0 brady episodes. 0 AF . Monthly summary reports and ROV with w/ GT 01/2017

## 2016-10-23 NOTE — Telephone Encounter (Signed)
Verbal refill given to [name] at the pharmacy 

## 2016-10-27 DIAGNOSIS — M3501 Sicca syndrome with keratoconjunctivitis: Secondary | ICD-10-CM | POA: Diagnosis not present

## 2016-11-11 ENCOUNTER — Ambulatory Visit (INDEPENDENT_AMBULATORY_CARE_PROVIDER_SITE_OTHER): Payer: Medicare HMO | Admitting: *Deleted

## 2016-11-11 DIAGNOSIS — I429 Cardiomyopathy, unspecified: Secondary | ICD-10-CM

## 2016-11-13 NOTE — Progress Notes (Signed)
Carelink Summary Report / Loop Recorder 

## 2016-11-16 LAB — CUP PACEART REMOTE DEVICE CHECK
Date Time Interrogation Session: 20180902113546
MDC IDC PG IMPLANT DT: 20180803

## 2016-11-18 ENCOUNTER — Other Ambulatory Visit: Payer: Self-pay | Admitting: Family Medicine

## 2016-11-18 NOTE — Telephone Encounter (Signed)
Called into [facility] - [place], [place] - [address]: [phone]  

## 2016-11-18 NOTE — Telephone Encounter (Signed)
Last refill 10/23/16  Last OV 08/19/16  Ok to refill?

## 2016-11-20 ENCOUNTER — Telehealth: Payer: Self-pay | Admitting: Internal Medicine

## 2016-11-20 NOTE — Telephone Encounter (Signed)
New message      Pt had a loop recorder put in august 3rd.  She states that the loop recorder area hurt.  When she takes a shower, the water makes that area hurt more.  Pt describes it as a "pulling sensation", a "throbbing" feeling.  There is no redness or swelling in the area  Is this normal?

## 2016-11-20 NOTE — Telephone Encounter (Signed)
Spoke with patient who reported pain at ILR site beginning within the last week. She denies relief of pain with tylenol and describes it as throbbing and "tearing". I offered her an appointment to be seen in the device clinic today but she described difficulty with transportation and stated that she would like to give it some more time. She will try some more pain medication and a warm compress. I encouraged her to call back if she chose to come in and we would be happy to see her today or tomorrow. She verbalized understanding.

## 2016-11-25 ENCOUNTER — Ambulatory Visit (INDEPENDENT_AMBULATORY_CARE_PROVIDER_SITE_OTHER): Payer: Medicare HMO | Admitting: Family Medicine

## 2016-11-25 ENCOUNTER — Encounter: Payer: Self-pay | Admitting: Family Medicine

## 2016-11-25 VITALS — BP 118/74 | HR 67 | Temp 98.2°F | Wt 131.0 lb

## 2016-11-25 DIAGNOSIS — N390 Urinary tract infection, site not specified: Secondary | ICD-10-CM

## 2016-11-25 DIAGNOSIS — R319 Hematuria, unspecified: Secondary | ICD-10-CM

## 2016-11-25 LAB — POC URINALSYSI DIPSTICK (AUTOMATED)
Bilirubin, UA: NEGATIVE
GLUCOSE UA: NEGATIVE
Ketones, UA: NEGATIVE
NITRITE UA: NEGATIVE
PH UA: 6 (ref 5.0–8.0)
Protein, UA: NEGATIVE
RBC UA: NEGATIVE
Spec Grav, UA: 1.025 (ref 1.010–1.025)
UROBILINOGEN UA: 0.2 U/dL

## 2016-11-25 MED ORDER — PROMETHAZINE HCL 12.5 MG PO TABS: ORAL_TABLET | ORAL | 5 refills | Status: DC

## 2016-11-25 MED ORDER — CIPROFLOXACIN HCL 250 MG PO TABS: 250.0000 mg | ORAL_TABLET | Freq: Two times a day (BID) | ORAL | 0 refills | Status: DC

## 2016-11-25 NOTE — Progress Notes (Signed)
SUBJECTIVE: [name] is a 71 y.o. female who complains of urinary frequency, urgency and dysuria x 8 days, without flank pain, fever, chills, or abnormal vaginal discharge or bleeding.   Current Outpatient Prescriptions on File Prior to Visit  Medication Sig Dispense Refill  . acetaminophen (TYLENOL) 500 MG tablet Take 1,000 mg by mouth every 6 (six) hours as needed for moderate pain or headache.    Marland Kitchen amLODipine (NORVASC) 2.5 MG tablet Take 1 tablet (2.5 mg total) by mouth daily. 90 tablet 3  . Carboxymeth-Glyc-Polysorb PF (REFRESH OPTIVE MEGA-3) 0.5-1-0.5 % SOLN Place 1 drop into both eyes 6 (six) times daily.    . carvedilol (COREG) 12.5 MG tablet TAKE ONE TABLET TWICE DAILY WITH A MEAL 180 tablet 3  . Cholecalciferol (VITAMIN D3) 2000 units capsule Take 2,000 Units by mouth daily.    . enalapril (VASOTEC) 10 MG tablet Take 1 tablet (10 mg total) by mouth daily. 90 tablet 3  . escitalopram (LEXAPRO) 20 MG tablet Take 1 tablet (20 mg total) by mouth daily. (Patient taking differently: Take 20 mg by mouth at bedtime. ) 30 tablet 11  . metroNIDAZOLE (METROCREAM) 0.75 % cream Apply 1 application topically once a week.     . Multiple Vitamin (MULTIVITAMIN) capsule Take 1 capsule by mouth daily.      . pantoprazole (PROTONIX) 40 MG tablet Take 1 tablet (40 mg total) by mouth 2 (two) times daily. 180 tablet 3  . simvastatin (ZOCOR) 20 MG tablet Take 1 tablet (20 mg total) by mouth at bedtime. 90 tablet 3  . tiZANidine (ZANAFLEX) 2 MG tablet TAKE ONE TABLET BY MOUTH EVERY EIGHT HOURS AS NEEDED FOR MUSCLE SPASM 30 tablet 1  . zolpidem (AMBIEN) 10 MG tablet TAKE 1 TABLET BY MOUTH AT BEDTIME 30 tablet 0   No current facility-administered medications on file prior to visit.     Allergies  Allergen Reactions  . Penicillins Anaphylaxis and Hives    Has patient had a PCN reaction causing immediate rash, facial/tongue/throat swelling, SOB or lightheadedness with hypotension: {Yes Has patient had a  PCN reaction causing severe rash involving mucus membranes or skin necrosis: {no Has patient had a PCN reaction that required hospitalization {yes Has patient had a PCN reaction occurring within the last 10 years: {no If all of the above answers are "NO", then may proceed with Cephalosporin use.  . Lansoprazole     REACTION: headaches  . Sulfa Antibiotics Hives  . Ceclor [Cefaclor] Itching and Rash  . Ceftin [Cefuroxime Axetil] Rash  . Clindamycin/Lincomycin Itching and Rash  . Latex Itching and Rash  . Lincomycin Hcl Itching and Rash  . Septra [Sulfamethoxazole-Trimethoprim] Rash    Past Medical History:  Diagnosis Date  . Cardiomyopathy    nonischemic  . Complication of anesthesia   . Erosive esophagitis    LA Classification Grade B  . GERD (gastroesophageal reflux disease)   . Hiatal hernia   . Hyperlipidemia   . Melanoma ([place])    basil cell, pt reports no melanoma  . Migraine   . Osteopenia   . PONV (postoperative nausea and vomiting)   . Sessile colonic polyp 10/2011   adenoma    Past Surgical History:  Procedure Laterality Date  . APPENDECTOMY    . CARDIAC CATHETERIZATION    . CATARACT EXTRACTION W/PHACO Right 03/18/2016   Procedure: CATARACT EXTRACTION PHACO AND INTRAOCULAR LENS PLACEMENT (IOC);  Surgeon: Birder [name], MD;  Location: [facility];  Service: Ophthalmology;  Laterality:  Right;  [place] 45.3 AP% 22.2 CDE 10.07 Fluid pack lot # [id number]  . CATARACT EXTRACTION W/PHACO Left 04/08/2016   Procedure: CATARACT EXTRACTION PHACO AND INTRAOCULAR LENS PLACEMENT (IOC);  Surgeon: Birder [name], MD;  Location: [facility];  Service: Ophthalmology;  Laterality: Left;  [place] 00:41 AP% 15.0 CDE 6.27 Fluid pack lot # [id number]  . LOOP RECORDER INSERTION N/A 10/10/2016   Procedure: Loop Recorder Insertion;  Surgeon: Evans Lance, MD;  Location: [facility];  Service: Cardiovascular;  Laterality: N/A;  . TUBAL LIGATION    . VAGINAL HYSTERECTOMY      Family History   Problem Relation Age of Onset  . Alzheimer's disease Mother   . Heart disease Father   . Arthritis/Rheumatoid Sister   . Colon cancer Neg Hx   . Stomach cancer Neg Hx   . Rectal cancer Neg Hx   . Esophageal cancer Neg Hx     Social History   Social History  . Marital status: Widowed    Spouse name: N/A  . Number of children: 3  . Years of education: 35   Occupational History  . Not on file.   Social History Main Topics  . Smoking status: Former Smoker    Quit date: 06/03/1981  . Smokeless tobacco: Never Used  . Alcohol use 1.8 oz/week    3 Glasses of wine per week     Comment: Wine per night  . Drug use: No  . Sexual activity: Not on file   Other Topics Concern  . Not on file   Social History Narrative   Lives alone in two-story home.     Her husband passed away unexpectedly.     They have three children- one is a pediatrician.   Desires CPR.   Caffeine use: Decaf coffee   Right handed   The PMH, PSH, Social History, Family History, Medications, and allergies have been reviewed in [facility], and have been updated if relevant.  OBJECTIVE:  BP 118/74 (BP Location: Left Arm, Patient Position: Sitting, Cuff Size: Normal)   Pulse 67   Temp 98.2 F (36.8 C) (Oral)   Wt 131 lb (59.4 kg)   SpO2 97%   BMI 23.96 kg/m   Appears well, in no apparent distress.  Vital signs are normal. The abdomen is soft without tenderness, guarding, mass, rebound or organomegaly. No CVA tenderness or inguinal adenopathy noted. Urine dipstick shows positive for WBC's.  ASSESSMENT: UTI uncomplicated without evidence of pyelonephritis  PLAN: Treatment per orders - cipro 250 mg twice daily x 7 days, also push fluids, may use Pyridium OTC prn. Call or return to clinic prn if these symptoms worsen or fail to improve as anticipated.

## 2016-11-25 NOTE — Patient Instructions (Signed)

## 2016-11-25 NOTE — Addendum Note (Signed)
Addended by: Pilar Grammes on: 11/25/2016 11:09 AM   Modules accepted: Orders

## 2016-11-27 ENCOUNTER — Telehealth: Payer: Self-pay | Admitting: Internal Medicine

## 2016-11-27 NOTE — Telephone Encounter (Signed)
New message   Pt is calling stating that her device site is painful. And it's been bothering her since she got it. She said even in the shower, water hitting it it hurts.

## 2016-11-27 NOTE — Telephone Encounter (Signed)
Spoke to patient about pain in her device area. Patient states that the pain has been going on for one week. She states that she doesn't remember hitting the site. She states that the site appears to be normal without any redness, drainage, or swelling. I told patient that for the pain she could continue to take ES tylenol. I told her that she could call and schedule an appt with Dr.Taylor if the site begins to show any signs of infection or if the pain does not improve within the next couple of weeks. Patient verbalized understanding.

## 2016-12-02 DIAGNOSIS — Z85828 Personal history of other malignant neoplasm of skin: Secondary | ICD-10-CM | POA: Diagnosis not present

## 2016-12-02 DIAGNOSIS — L821 Other seborrheic keratosis: Secondary | ICD-10-CM | POA: Diagnosis not present

## 2016-12-02 DIAGNOSIS — L570 Actinic keratosis: Secondary | ICD-10-CM | POA: Diagnosis not present

## 2016-12-05 ENCOUNTER — Telehealth: Payer: Self-pay | Admitting: Internal Medicine

## 2016-12-05 NOTE — Telephone Encounter (Signed)
New message    Pt is calling to ask if her episode this morning recorded. She said she hit the button on her home monitor.

## 2016-12-05 NOTE — Telephone Encounter (Signed)
Spoke with patient.  Manual transmission was received at 0912 this morning, presenting rhythm is sinus rhythm at ~80bpm.  No symptom, tachy, brady, pause, or AF episodes noted.  Patient reports that she felt "weird" and did not want to get out of bed to get her symptom activator downstairs so she sent a manual transmission with her home monitor.  Advised patient that transmission does not show any abnormalities.  Reviewed appropriate use of symptom activator and advised patient that using the Carelink monitor does not replace using the symptom activator.  Attempted to discuss patient's symptoms.  She said she woke up this morning and felt "weird" and hot all over so she sent a transmission.  Still does not feel "right".  Patient denies ShOB, chest discomfort, dizziness, palpitations, or visual changes.  Speech is clear and appropriate, oriented at baseline.  Patient has no way to check her BP at home and does not feel comfortable leaving the house to find a BP cuff.  Encouraged patient to check her temperature and contact PCP office for any recommendations, and advised that ED is available if symptoms worsen over the weekend.  Patient verbalizes understanding and appreciation.  Upon reviewing chart, noted that patient was recently diagnosed with possible UTI.  Called patient back and she states that she was told to hold off on starting cipro unless symptoms worsened.  Reiterated that she should check her temperature and especially if febrile, she should contact PCP's office.  Patient verbalizes understanding and states she will contact the triage RN at PCP office for further recommendations.  She is appreciative and denies additional questions at this time.

## 2016-12-08 DIAGNOSIS — H01009 Unspecified blepharitis unspecified eye, unspecified eyelid: Secondary | ICD-10-CM | POA: Diagnosis not present

## 2016-12-08 DIAGNOSIS — H16223 Keratoconjunctivitis sicca, not specified as Sjogren's, bilateral: Secondary | ICD-10-CM | POA: Diagnosis not present

## 2016-12-09 ENCOUNTER — Ambulatory Visit (INDEPENDENT_AMBULATORY_CARE_PROVIDER_SITE_OTHER): Payer: Medicare HMO | Admitting: *Deleted

## 2016-12-09 DIAGNOSIS — R55 Syncope and collapse: Secondary | ICD-10-CM

## 2016-12-09 NOTE — Progress Notes (Signed)
Carelink Summary Report / Loop Recorder 

## 2016-12-10 ENCOUNTER — Telehealth: Payer: Self-pay | Admitting: Family Medicine

## 2016-12-10 NOTE — Telephone Encounter (Signed)
Left pt message asking to call Ebony Hail back directly at [phone] to schedule AWV + labs with Katha Cabal and CPE with PCP.  *NOTE* Last AWV 07/17/15; please schedule anytime

## 2016-12-11 LAB — CUP PACEART REMOTE DEVICE CHECK
Date Time Interrogation Session: 20181002120611
MDC IDC PG IMPLANT DT: 20180803

## 2016-12-16 ENCOUNTER — Ambulatory Visit: Payer: Medicare HMO | Admitting: Primary Care

## 2016-12-22 ENCOUNTER — Encounter: Payer: Self-pay | Admitting: Primary Care

## 2016-12-22 ENCOUNTER — Ambulatory Visit (INDEPENDENT_AMBULATORY_CARE_PROVIDER_SITE_OTHER): Payer: Medicare HMO | Admitting: Primary Care

## 2016-12-22 VITALS — BP 126/70 | HR 59 | Temp 98.6°F | Ht 62.0 in | Wt 133.0 lb

## 2016-12-22 DIAGNOSIS — E7800 Pure hypercholesterolemia, unspecified: Secondary | ICD-10-CM | POA: Diagnosis not present

## 2016-12-22 DIAGNOSIS — R51 Headache: Secondary | ICD-10-CM | POA: Diagnosis not present

## 2016-12-22 DIAGNOSIS — R55 Syncope and collapse: Secondary | ICD-10-CM

## 2016-12-22 DIAGNOSIS — M25541 Pain in joints of right hand: Secondary | ICD-10-CM

## 2016-12-22 DIAGNOSIS — Z0283 Encounter for blood-alcohol and blood-drug test: Secondary | ICD-10-CM | POA: Diagnosis not present

## 2016-12-22 DIAGNOSIS — G4700 Insomnia, unspecified: Secondary | ICD-10-CM | POA: Diagnosis not present

## 2016-12-22 DIAGNOSIS — M25542 Pain in joints of left hand: Secondary | ICD-10-CM | POA: Diagnosis not present

## 2016-12-22 DIAGNOSIS — K2100 Gastro-esophageal reflux disease with esophagitis, without bleeding: Secondary | ICD-10-CM

## 2016-12-22 DIAGNOSIS — F3341 Major depressive disorder, recurrent, in partial remission: Secondary | ICD-10-CM

## 2016-12-22 DIAGNOSIS — I429 Cardiomyopathy, unspecified: Secondary | ICD-10-CM | POA: Diagnosis not present

## 2016-12-22 DIAGNOSIS — R519 Headache, unspecified: Secondary | ICD-10-CM

## 2016-12-22 DIAGNOSIS — Z8261 Family history of arthritis: Secondary | ICD-10-CM

## 2016-12-22 LAB — COMPREHENSIVE METABOLIC PANEL
ALT: 16 U/L (ref 0–35)
AST: 19 U/L (ref 0–37)
Albumin: 4.4 g/dL (ref 3.5–5.2)
Alkaline Phosphatase: 64 U/L (ref 39–117)
BUN: 11 mg/dL (ref 6–23)
CO2: 27 mEq/L (ref 19–32)
Calcium: 9.9 mg/dL (ref 8.4–10.5)
Chloride: 104 mEq/L (ref 96–112)
Creatinine, Ser: 0.64 mg/dL (ref 0.40–1.20)
GFR: 96.85 mL/min (ref >60.00)
GLUCOSE: 105 mg/dL — AB (ref 70–99)
POTASSIUM: 4.6 meq/L (ref 3.5–5.1)
Sodium: 142 mEq/L (ref 135–145)
Total Bilirubin: 0.6 mg/dL (ref 0.2–1.2)
Total Protein: 7.2 g/dL (ref 6.0–8.3)

## 2016-12-22 LAB — CBC WITH DIFFERENTIAL/PLATELET
Basophils Absolute: 0.1 10*3/uL (ref 0.0–0.1)
Basophils Relative: 0.9 % (ref 0.0–3.0)
Eosinophils Absolute: 0.3 10*3/uL (ref 0.0–0.7)
Eosinophils Relative: 4.1 % (ref 0.0–5.0)
HCT: 38.7 % (ref 36.0–46.0)
Hemoglobin: 12.9 g/dL (ref 12.0–15.0)
Lymphocytes Relative: 21.3 % (ref 12.0–46.0)
Lymphs Abs: 1.3 10*3/uL (ref 0.7–4.0)
MCHC: 33.4 g/dL (ref 30.0–36.0)
MCV: 91.1 fl (ref 78.0–100.0)
Monocytes Absolute: 0.5 10*3/uL (ref 0.1–1.0)
Monocytes Relative: 8.8 % (ref 3.0–12.0)
NEUTROS PCT: 64.9 % (ref 43.0–77.0)
Neutro Abs: 4 10*3/uL (ref 1.4–7.7)
Platelets: 273 10*3/uL (ref 150.0–400.0)
RBC: 4.25 Mil/uL (ref 3.87–5.11)
RDW: 12.9 % (ref 11.5–15.5)
WBC: 6.2 10*3/uL (ref 4.0–10.5)

## 2016-12-22 LAB — SEDIMENTATION RATE: SED RATE: 20 mm/h (ref 0–30)

## 2016-12-22 MED ORDER — ZOLPIDEM TARTRATE 10 MG PO TABS: 10.0000 mg | ORAL_TABLET | Freq: Every day | ORAL | 0 refills | Status: DC

## 2016-12-22 NOTE — Assessment & Plan Note (Signed)
Located behind eyes, history of cataract removal.  Uses promethazine PRN for severe headaches. Mostly uses tylenol.

## 2016-12-22 NOTE — Progress Notes (Signed)
Subjective:    Patient ID: [name], female    DOB: [birth year], 71 y.o.   MRN: [id number]  HPI  [name] is a 71 year old female who presents today to transfer care from Dr. Deborra Medina.  1) Essential Hypertension/CardiomyopathyHyperlipideia: Currently managed on amlodipine 2.5 mg, carvedilol 21.5 mg BID, enalapril 10 mg. Also managed on Simvastatin 20 mg. Lipid panel in May 2018 with TC of 156, Trigs of 218, LDL of 70.  Currently following with cardiology with last visit being July/August 2018. She has a history of near syncope/syncope with her last episode being in June 2018.  Echocardiogram and carotid dopplers completed which were stable (EF of 45%), ECG with LBBB which was unchanged (QRS duration of 138 which is unchanged). During her follow up visit in late July she and her cardiologist agreed to proceed with surgical loop recorder insertion.   She does endorse another "episode" several weeks ago. She is due for follow up in November 2018.   2) Insomnia: Currently managed on zolpidem 10 mg. Doing well on this medication and is needing refills.   3) GERD: Currently managed on pantoprazole 40 mg. Currently following with GI. Without her pantoprazole she will experience chest pressure.   4) Depression: Currently managed on Lexapro 20 mg. Overall feels well managed on this medication.  5) Chronic Back Pain: Previously following with orthopedics and receiving injections. Previously managed on Tizanidine for which she's completed. She is using tylenol and Biofreeze now.   6) Family History of Rheumatoid Arthritis: History of autoimmune diseases in sisters children and daughter; history of RA in her sister. She has noticed hand pain bilaterally for the past 3 weeks. She would like testing for RA and autoimmune disease.    Review of Systems  Eyes: Negative for visual disturbance.  Respiratory: Negative for shortness of breath.   Cardiovascular: Negative for chest pain.  Musculoskeletal:  Positive for arthralgias.       Intermittent right sided low back pain  Neurological: Negative for weakness.       Mild near syncopal episode three weeks ago, denies recent symptoms. Intermittent headaches.  Psychiatric/Behavioral:       Feels well managed on Lexapro and Ambien.       Past Medical History:  Diagnosis Date  . Cardiomyopathy    nonischemic  . Chronic back pain   . Complication of anesthesia   . Erosive esophagitis    LA Classification Grade B  . GERD (gastroesophageal reflux disease)   . Hiatal hernia   . Hyperlipidemia   . Melanoma ([place])    basil cell, pt reports no melanoma  . Migraine   . Osteopenia   . PONV (postoperative nausea and vomiting)   . Rosacea   . Sessile colonic polyp 10/2011   adenoma     Social History   Social History  . Marital status: Widowed    Spouse name: N/A  . Number of children: 3  . Years of education: 54   Occupational History  . Not on file.   Social History Main Topics  . Smoking status: Former Smoker    Quit date: 06/03/1981  . Smokeless tobacco: Never Used  . Alcohol use 1.8 oz/week    3 Glasses of wine per week     Comment: Wine per night  . Drug use: No  . Sexual activity: Not on file   Other Topics Concern  . Not on file   Social History Narrative   Lives alone  in two-story home.     Her husband passed away unexpectedly.     They have three children- one is a pediatrician.   Desires CPR.   Caffeine use: Decaf coffee   Right handed    Past Surgical History:  Procedure Laterality Date  . APPENDECTOMY    . CARDIAC CATHETERIZATION    . CATARACT EXTRACTION W/PHACO Right 03/18/2016   Procedure: CATARACT EXTRACTION PHACO AND INTRAOCULAR LENS PLACEMENT (IOC);  Surgeon: Birder [name], MD;  Location: [facility];  Service: Ophthalmology;  Laterality: Right;  [place] 45.3 AP% 22.2 CDE 10.07 Fluid pack lot # [id number]  . CATARACT EXTRACTION W/PHACO Left 04/08/2016   Procedure: CATARACT EXTRACTION PHACO AND  INTRAOCULAR LENS PLACEMENT (IOC);  Surgeon: Birder [name], MD;  Location: [facility];  Service: Ophthalmology;  Laterality: Left;  [place] 00:41 AP% 15.0 CDE 6.27 Fluid pack lot # [id number]  . LOOP RECORDER INSERTION N/A 10/10/2016   Procedure: Loop Recorder Insertion;  Surgeon: Evans Lance, MD;  Location: [place];  Service: Cardiovascular;  Laterality: N/A;  . TUBAL LIGATION    . VAGINAL HYSTERECTOMY      Family History  Problem Relation Age of Onset  . Alzheimer's disease Mother   . Heart disease Father   . Arthritis/Rheumatoid Sister   . Colon cancer Neg Hx   . Stomach cancer Neg Hx   . Rectal cancer Neg Hx   . Esophageal cancer Neg Hx     Allergies  Allergen Reactions  . Penicillins Anaphylaxis and Hives    Has patient had a PCN reaction causing immediate rash, facial/tongue/throat swelling, SOB or lightheadedness with hypotension: {Yes Has patient had a PCN reaction causing severe rash involving mucus membranes or skin necrosis: {no Has patient had a PCN reaction that required hospitalization {yes Has patient had a PCN reaction occurring within the last 10 years: {no If all of the above answers are "NO", then may proceed with Cephalosporin use.  . Lansoprazole     REACTION: headaches  . Sulfa Antibiotics Hives  . Ceclor [Cefaclor] Itching and Rash  . Ceftin [Cefuroxime Axetil] Rash  . Clindamycin/Lincomycin Itching and Rash  . Latex Itching and Rash  . Lincomycin Hcl Itching and Rash  . Septra [Sulfamethoxazole-Trimethoprim] Rash    Current Outpatient Prescriptions on File Prior to Visit  Medication Sig Dispense Refill  . acetaminophen (TYLENOL) 500 MG tablet Take 1,000 mg by mouth every 6 (six) hours as needed for moderate pain or headache.    Marland Kitchen amLODipine (NORVASC) 2.5 MG tablet Take 1 tablet (2.5 mg total) by mouth daily. 90 tablet 3  . Carboxymeth-Glyc-Polysorb PF (REFRESH OPTIVE MEGA-3) 0.5-1-0.5 % SOLN Place 1 drop into both eyes 6 (six) times daily.      . carvedilol (COREG) 12.5 MG tablet TAKE ONE TABLET TWICE DAILY WITH A MEAL 180 tablet 3  . Cholecalciferol (VITAMIN D3) 2000 units capsule Take 2,000 Units by mouth daily.    . enalapril (VASOTEC) 10 MG tablet Take 1 tablet (10 mg total) by mouth daily. 90 tablet 3  . escitalopram (LEXAPRO) 20 MG tablet Take 1 tablet (20 mg total) by mouth daily. (Patient taking differently: Take 20 mg by mouth at bedtime. ) 30 tablet 11  . metroNIDAZOLE (METROCREAM) 0.75 % cream Apply 1 application topically once a week.     . Multiple Vitamin (MULTIVITAMIN) capsule Take 1 capsule by mouth daily.      . pantoprazole (PROTONIX) 40 MG tablet Take 1 tablet (40 mg total) by  mouth 2 (two) times daily. 180 tablet 3  . simvastatin (ZOCOR) 20 MG tablet Take 1 tablet (20 mg total) by mouth at bedtime. 90 tablet 3  . tiZANidine (ZANAFLEX) 2 MG tablet TAKE ONE TABLET BY MOUTH EVERY EIGHT HOURS AS NEEDED FOR MUSCLE SPASM 30 tablet 1  . promethazine (PHENERGAN) 12.5 MG tablet TAKE ONE TABLET BY MOUTH EVERY 8 HOURS AS NEEDED FOR NAUSEA/VOMITING (Patient not taking: Reported on 12/22/2016) 20 tablet 5   No current facility-administered medications on file prior to visit.     BP 126/70   Pulse (!) 59   Temp 98.6 F (37 C) (Oral)   Ht 5\' 2"  (1.575 m)   Wt 133 lb (60.3 kg)   SpO2 98%   BMI 24.33 kg/m    Objective:   Physical Exam  Constitutional: She appears well-nourished.  Neck: Neck supple.  Cardiovascular: Normal rate and regular rhythm.   Pulmonary/Chest: Effort normal and breath sounds normal.  Musculoskeletal:  No obvious deformity or joint swelling to hands.  Skin: Skin is warm and dry.  Psychiatric: She has a normal mood and affect.          Assessment & Plan:  Arthralgia of Hands:  Patient nervous about possibly having RA or other autoimmune disease. General arthralgias to hands, no swelling. Exam today unremarkable. Will test for RA and other autoimmune diseases given family  history. Discussed use of tylenol PRN as this works.  Sheral Flow, NP

## 2016-12-22 NOTE — Assessment & Plan Note (Signed)
Doing well on Lexapro, continue same. Denies Si/HI.

## 2016-12-22 NOTE — Assessment & Plan Note (Signed)
Doing well on Ambien, refills provided today.  UDS and controlled substance contract obtained.

## 2016-12-22 NOTE — Assessment & Plan Note (Signed)
Continue carvedilol, enalapril, amlodipine.

## 2016-12-22 NOTE — Patient Instructions (Signed)
Complete lab work prior to leaving today. I will notify you of your results once received.   Schedule a 30 minute follow up visit in 6 months for re-evaluation.  It was a pleasure to see you today!

## 2016-12-22 NOTE — Assessment & Plan Note (Signed)
Lipids in May 2018 stable, continue simvastatin. Repeat LFT's given slight elevation in May.

## 2016-12-22 NOTE — Assessment & Plan Note (Signed)
Doing well on pantoprazole 40 mg, continue GI follow up.

## 2016-12-22 NOTE — Assessment & Plan Note (Signed)
Now with loop recorder, following with cardiology.

## 2016-12-23 LAB — RHEUMATOID FACTOR: RHEUMATOID FACTOR: 30 [IU]/mL — AB (ref <14)

## 2016-12-23 LAB — ANA: ANA: NEGATIVE

## 2016-12-24 ENCOUNTER — Other Ambulatory Visit: Payer: Self-pay | Admitting: Primary Care

## 2016-12-24 DIAGNOSIS — M159 Polyosteoarthritis, unspecified: Secondary | ICD-10-CM

## 2016-12-24 DIAGNOSIS — M150 Primary generalized (osteo)arthritis: Principal | ICD-10-CM

## 2016-12-26 LAB — PAIN MGMT, PROFILE 8 W/CONF, U
6 ACETYLMORPHINE: NEGATIVE ng/mL (ref <10)
AMPHETAMINES: NEGATIVE ng/mL (ref <500)
Alcohol Metabolites: POSITIVE ng/mL — AB (ref <500)
Benzodiazepines: NEGATIVE ng/mL (ref <100)
Buprenorphine, Urine: NEGATIVE ng/mL (ref <5)
Cocaine Metabolite: NEGATIVE ng/mL (ref <150)
Creatinine: 51.1 mg/dL
Ethyl Glucuronide (ETG): 2096 ng/mL — ABNORMAL HIGH (ref <500)
Ethyl Sulfate (ETS): 387 ng/mL — ABNORMAL HIGH (ref <100)
MARIJUANA METABOLITE: NEGATIVE ng/mL (ref <20)
MDMA: NEGATIVE ng/mL (ref <500)
OXYCODONE: NEGATIVE ng/mL (ref <100)
Opiates: NEGATIVE ng/mL (ref <100)
Oxidant: NEGATIVE ug/mL (ref <200)
pH: 6.95 (ref 4.5–9.0)

## 2017-01-08 ENCOUNTER — Ambulatory Visit (INDEPENDENT_AMBULATORY_CARE_PROVIDER_SITE_OTHER): Payer: Medicare HMO | Admitting: *Deleted

## 2017-01-08 DIAGNOSIS — R768 Other specified abnormal immunological findings in serum: Secondary | ICD-10-CM | POA: Diagnosis not present

## 2017-01-08 DIAGNOSIS — M19041 Primary osteoarthritis, right hand: Secondary | ICD-10-CM | POA: Diagnosis not present

## 2017-01-08 DIAGNOSIS — M19042 Primary osteoarthritis, left hand: Secondary | ICD-10-CM | POA: Diagnosis not present

## 2017-01-08 DIAGNOSIS — M79642 Pain in left hand: Secondary | ICD-10-CM | POA: Diagnosis not present

## 2017-01-08 DIAGNOSIS — R55 Syncope and collapse: Secondary | ICD-10-CM | POA: Diagnosis not present

## 2017-01-08 DIAGNOSIS — M79641 Pain in right hand: Secondary | ICD-10-CM | POA: Diagnosis not present

## 2017-01-09 NOTE — Progress Notes (Signed)
Carelink Summary Report / Loop Recorder 

## 2017-01-12 LAB — CUP PACEART REMOTE DEVICE CHECK
Date Time Interrogation Session: 20181101121400
Implantable Pulse Generator Implant Date: 20180803

## 2017-01-21 ENCOUNTER — Encounter: Payer: Self-pay | Admitting: Internal Medicine

## 2017-01-21 ENCOUNTER — Ambulatory Visit: Payer: Medicare HMO | Admitting: Internal Medicine

## 2017-01-21 VITALS — BP 116/72 | HR 67 | Ht 62.0 in | Wt 132.6 lb

## 2017-01-21 DIAGNOSIS — I447 Left bundle-branch block, unspecified: Secondary | ICD-10-CM | POA: Diagnosis not present

## 2017-01-21 DIAGNOSIS — R55 Syncope and collapse: Secondary | ICD-10-CM | POA: Diagnosis not present

## 2017-01-21 DIAGNOSIS — H43813 Vitreous degeneration, bilateral: Secondary | ICD-10-CM | POA: Diagnosis not present

## 2017-01-21 NOTE — Progress Notes (Signed)
HPI [name] returns today for followup. She is a pleasant 71 yo woman with syncope who underwent ILR insertion. Her episode occurred six months ago and she has not had a recurrence. She feels well. No chest pain or sob. No syncope.  Allergies  Allergen Reactions  . Penicillin G Swelling    Rash,  . Penicillins Anaphylaxis and Hives    Has patient had a PCN reaction causing immediate rash, facial/tongue/throat swelling, SOB or lightheadedness with hypotension: {Yes Has patient had a PCN reaction causing severe rash involving mucus membranes or skin necrosis: {no Has patient had a PCN reaction that required hospitalization {yes Has patient had a PCN reaction occurring within the last 10 years: {no If all of the above answers are "NO", then may proceed with Cephalosporin use.  . Clindamycin Itching    rash  . Lansoprazole Other (See Comments)    REACTION: headaches  . Lincomycin Itching    rash  . Sulfa Antibiotics Hives  . Sulfasalazine Hives  . Cefaclor Itching and Rash    rash  . Cefuroxime Axetil Rash and Itching    rash  . Clindamycin/Lincomycin Itching and Rash  . Latex Itching and Rash    rash  . Lincomycin Hcl Itching and Rash  . Sulfamethoxazole-Trimethoprim Rash     Current Outpatient Medications  Medication Sig Dispense Refill  . acetaminophen (TYLENOL) 500 MG tablet Take 1,000 mg by mouth every 6 (six) hours as needed for moderate pain or headache.    Marland Kitchen amLODipine (NORVASC) 2.5 MG tablet Take 1 tablet (2.5 mg total) by mouth daily. 90 tablet 3  . Carboxymeth-Glyc-Polysorb PF (REFRESH OPTIVE MEGA-3) 0.5-1-0.5 % SOLN Place 1 drop into both eyes 6 (six) times daily.    . carvedilol (COREG) 12.5 MG tablet TAKE ONE TABLET TWICE DAILY WITH A MEAL 180 tablet 3  . Cholecalciferol (VITAMIN D3) 2000 units capsule Take 2,000 Units by mouth daily.    . enalapril (VASOTEC) 10 MG tablet Take 1 tablet (10 mg total) by mouth daily. 90 tablet 3  . escitalopram (LEXAPRO)  20 MG tablet Take 20 mg daily by mouth.    . metroNIDAZOLE (METROCREAM) 0.75 % cream Apply 1 application topically once a week.     . Multiple Vitamin (MULTIVITAMIN) capsule Take 1 capsule by mouth daily.      . pantoprazole (PROTONIX) 40 MG tablet Take 1 tablet (40 mg total) by mouth 2 (two) times daily. 180 tablet 3  . promethazine (PHENERGAN) 12.5 MG tablet TAKE ONE TABLET BY MOUTH EVERY 8 HOURS AS NEEDED FOR NAUSEA/VOMITING 20 tablet 5  . simvastatin (ZOCOR) 20 MG tablet Take 1 tablet (20 mg total) by mouth at bedtime. 90 tablet 3  . tiZANidine (ZANAFLEX) 2 MG tablet TAKE ONE TABLET BY MOUTH EVERY EIGHT HOURS AS NEEDED FOR MUSCLE SPASM 30 tablet 1  . zolpidem (AMBIEN) 10 MG tablet Take 1 tablet (10 mg total) by mouth at bedtime. 90 tablet 0   No current facility-administered medications for this visit.      Past Medical History:  Diagnosis Date  . Cardiomyopathy    nonischemic  . Chronic back pain   . Complication of anesthesia   . Erosive esophagitis    LA Classification Grade B  . Essential hypertension   . GERD (gastroesophageal reflux disease)   . Hiatal hernia   . Hyperlipidemia   . Insomnia   . MDD (major depressive disorder)   . Melanoma ([place])  basil cell, pt reports no melanoma  . Migraine   . Osteopenia   . PONV (postoperative nausea and vomiting)   . Rosacea   . Sessile colonic polyp 10/2011   adenoma    ROS:   All systems reviewed and negative except as noted in the HPI.   Past Surgical History:  Procedure Laterality Date  . APPENDECTOMY    . CARDIAC CATHETERIZATION    . TUBAL LIGATION    . VAGINAL HYSTERECTOMY       Family History  Problem Relation Age of Onset  . Alzheimer's disease Mother   . Heart disease Father   . Arthritis/Rheumatoid Sister   . Colon cancer Neg Hx   . Stomach cancer Neg Hx   . Rectal cancer Neg Hx   . Esophageal cancer Neg Hx      Social History   Socioeconomic History  . Marital status: Widowed    Spouse name:  Not on file  . Number of children: 3  . Years of education: 33  . Highest education level: Not on file  Social Needs  . Financial resource strain: Not on file  . Food insecurity - worry: Not on file  . Food insecurity - inability: Not on file  . Transportation needs - medical: Not on file  . Transportation needs - non-medical: Not on file  Occupational History  . Not on file  Tobacco Use  . Smoking status: Former Smoker    Last attempt to quit: 06/03/1981    Years since quitting: 35.6  . Smokeless tobacco: Never Used  Substance and Sexual Activity  . Alcohol use: Yes    Alcohol/week: 1.8 oz    Types: 3 Glasses of wine per week    Comment: Wine per night  . Drug use: No  . Sexual activity: Not on file  Other Topics Concern  . Not on file  Social History Narrative   Lives alone in two-story home.     Her husband passed away unexpectedly.     They have three children- one is a pediatrician.   Desires CPR.   Caffeine use: Decaf coffee   Right handed     BP 116/72   Pulse 67   Ht 5\' 2"  (1.575 m)   Wt 132 lb 9.6 oz (60.1 kg)   SpO2 96%   BMI 24.25 kg/m   Physical Exam:  Well appearing NAD HEENT: Unremarkable Neck:  No JVD, no thyromegally Lymphatics:  No adenopathy Back:  No CVA tenderness Lungs:  Clear with no wheezes HEART:  Regular rate rhythm, no murmurs, no rubs, no clicks Abd:  soft, positive bowel sounds, no organomegally, no rebound, no guarding Ext:  2 plus pulses, no edema, no cyanosis, no clubbing Skin:  No rashes no nodules Neuro:  CN II through XII intact, motor grossly intact   DEVICE  Normal device function.  See PaceArt for details. No pauses or tachycardia  Assess/Plan: 1. Syncope - she has not had any additional episodes. She may return to driving at the 6 month mark. I have encouraged her to drive only when she needs to. She has ILBBB/LBBB and I expect we will see her have transient CHB and at that point PPM would be recommended 2. ILR - her  device is working normally. Will follow. 3. Chronic systolic heart failure - she has class 1 symptoms. Her EF was 45% by echo. We will follow. If she develops worsening heart block, might consider insertion of a BiV PPM.  If her EF worsens would consider ICD.   Mikle Bosworth.D.

## 2017-01-21 NOTE — Patient Instructions (Addendum)

## 2017-01-22 DIAGNOSIS — H43813 Vitreous degeneration, bilateral: Secondary | ICD-10-CM | POA: Diagnosis not present

## 2017-02-09 ENCOUNTER — Ambulatory Visit (INDEPENDENT_AMBULATORY_CARE_PROVIDER_SITE_OTHER): Payer: Medicare HMO | Admitting: *Deleted

## 2017-02-09 DIAGNOSIS — H16223 Keratoconjunctivitis sicca, not specified as Sjogren's, bilateral: Secondary | ICD-10-CM | POA: Diagnosis not present

## 2017-02-09 DIAGNOSIS — H01009 Unspecified blepharitis unspecified eye, unspecified eyelid: Secondary | ICD-10-CM | POA: Diagnosis not present

## 2017-02-09 DIAGNOSIS — R55 Syncope and collapse: Secondary | ICD-10-CM | POA: Diagnosis not present

## 2017-02-09 NOTE — Progress Notes (Signed)
Carelink Summary Report / Loop Recorder 

## 2017-02-17 LAB — CUP PACEART REMOTE DEVICE CHECK
Date Time Interrogation Session: 20181201131020
Implantable Pulse Generator Implant Date: 20180803

## 2017-02-20 LAB — CUP PACEART INCLINIC DEVICE CHECK
Implantable Pulse Generator Implant Date: 20180803
MDC IDC SESS DTM: 20181114160727

## 2017-02-24 DIAGNOSIS — H16223 Keratoconjunctivitis sicca, not specified as Sjogren's, bilateral: Secondary | ICD-10-CM | POA: Diagnosis not present

## 2017-03-09 ENCOUNTER — Ambulatory Visit (INDEPENDENT_AMBULATORY_CARE_PROVIDER_SITE_OTHER): Payer: Medicare HMO | Admitting: *Deleted

## 2017-03-09 DIAGNOSIS — R55 Syncope and collapse: Secondary | ICD-10-CM | POA: Diagnosis not present

## 2017-03-09 NOTE — Progress Notes (Signed)
Carelink Summary Report / Loop Recorder 

## 2017-03-13 DIAGNOSIS — Z961 Presence of intraocular lens: Secondary | ICD-10-CM | POA: Diagnosis not present

## 2017-03-13 DIAGNOSIS — H04123 Dry eye syndrome of bilateral lacrimal glands: Secondary | ICD-10-CM | POA: Diagnosis not present

## 2017-03-13 DIAGNOSIS — H018 Other specified inflammations of eyelid: Secondary | ICD-10-CM | POA: Diagnosis not present

## 2017-03-19 ENCOUNTER — Other Ambulatory Visit: Payer: Self-pay | Admitting: Family Medicine

## 2017-03-19 DIAGNOSIS — G4700 Insomnia, unspecified: Secondary | ICD-10-CM

## 2017-03-20 NOTE — Telephone Encounter (Signed)
Okay to phone in, #90, no refills.

## 2017-03-20 NOTE — Telephone Encounter (Signed)
KC-Plz see refill req/thx dmf 

## 2017-03-23 NOTE — Telephone Encounter (Signed)
Rx called to pharmacy as instructed. 

## 2017-03-24 LAB — CUP PACEART REMOTE DEVICE CHECK
Implantable Pulse Generator Implant Date: 20180803
MDC IDC SESS DTM: 20181231130831

## 2017-03-31 ENCOUNTER — Other Ambulatory Visit: Payer: Self-pay

## 2017-03-31 ENCOUNTER — Telehealth: Payer: Self-pay | Admitting: Internal Medicine

## 2017-03-31 DIAGNOSIS — B351 Tinea unguium: Secondary | ICD-10-CM | POA: Diagnosis not present

## 2017-03-31 DIAGNOSIS — D044 Carcinoma in situ of skin of scalp and neck: Secondary | ICD-10-CM | POA: Diagnosis not present

## 2017-03-31 DIAGNOSIS — D485 Neoplasm of uncertain behavior of skin: Secondary | ICD-10-CM | POA: Diagnosis not present

## 2017-03-31 DIAGNOSIS — L570 Actinic keratosis: Secondary | ICD-10-CM | POA: Diagnosis not present

## 2017-03-31 MED ORDER — SIMVASTATIN 20 MG PO TABS: 20.0000 mg | ORAL_TABLET | Freq: Every day | ORAL | 0 refills | Status: DC

## 2017-03-31 NOTE — Telephone Encounter (Signed)
New ME[facility]   *STAT* If patient is at the pharmacy, call can be transferred to refill team.   1. Which medications need to be refilled? (please list name of each medication and dose if known) zocor 20mg    2. Which pharmacy/location (including street and city if local pharmacy) is medication to be sent to? [facility]   3. Do they need a 30 day or 90 day supply? [place]

## 2017-04-06 DIAGNOSIS — H01009 Unspecified blepharitis unspecified eye, unspecified eyelid: Secondary | ICD-10-CM | POA: Diagnosis not present

## 2017-04-06 DIAGNOSIS — H16223 Keratoconjunctivitis sicca, not specified as Sjogren's, bilateral: Secondary | ICD-10-CM | POA: Diagnosis not present

## 2017-04-06 DIAGNOSIS — Z961 Presence of intraocular lens: Secondary | ICD-10-CM | POA: Diagnosis not present

## 2017-04-08 ENCOUNTER — Ambulatory Visit (INDEPENDENT_AMBULATORY_CARE_PROVIDER_SITE_OTHER): Payer: Medicare HMO | Admitting: *Deleted

## 2017-04-08 DIAGNOSIS — R55 Syncope and collapse: Secondary | ICD-10-CM | POA: Diagnosis not present

## 2017-04-08 NOTE — Progress Notes (Signed)
Carelink Summary Report / Loop Recorder 

## 2017-04-10 DIAGNOSIS — Z0100 Encounter for examination of eyes and vision without abnormal findings: Secondary | ICD-10-CM | POA: Diagnosis not present

## 2017-04-10 DIAGNOSIS — H02053 Trichiasis without entropian right eye, unspecified eyelid: Secondary | ICD-10-CM | POA: Diagnosis not present

## 2017-04-17 ENCOUNTER — Encounter: Payer: Self-pay | Admitting: Internal Medicine

## 2017-04-21 LAB — CUP PACEART REMOTE DEVICE CHECK
Date Time Interrogation Session: 20190130134039
MDC IDC PG IMPLANT DT: 20180803

## 2017-05-04 ENCOUNTER — Ambulatory Visit: Payer: Medicare HMO | Admitting: Internal Medicine

## 2017-05-04 ENCOUNTER — Encounter: Payer: Self-pay | Admitting: Internal Medicine

## 2017-05-04 VITALS — BP 118/68 | HR 71 | Ht 62.0 in | Wt 133.0 lb

## 2017-05-04 DIAGNOSIS — R42 Dizziness and giddiness: Secondary | ICD-10-CM

## 2017-05-04 DIAGNOSIS — I429 Cardiomyopathy, unspecified: Secondary | ICD-10-CM

## 2017-05-04 DIAGNOSIS — R55 Syncope and collapse: Secondary | ICD-10-CM | POA: Diagnosis not present

## 2017-05-04 DIAGNOSIS — R079 Chest pain, unspecified: Secondary | ICD-10-CM

## 2017-05-04 DIAGNOSIS — I447 Left bundle-branch block, unspecified: Secondary | ICD-10-CM | POA: Diagnosis not present

## 2017-05-04 DIAGNOSIS — E7800 Pure hypercholesterolemia, unspecified: Secondary | ICD-10-CM | POA: Diagnosis not present

## 2017-05-04 LAB — BASIC METABOLIC PANEL
BUN / CREAT RATIO: 20 (ref 12–28)
BUN: 13 mg/dL (ref 8–27)
CO2: 23 mmol/L (ref 20–29)
CREATININE: 0.66 mg/dL (ref 0.57–1.00)
Calcium: 9.6 mg/dL (ref 8.7–10.3)
Chloride: 102 mmol/L (ref 96–106)
GFR, EST AFRICAN AMERICAN: 102 mL/min/{1.73_m2} (ref >59)
GFR, EST NON AFRICAN AMERICAN: 89 mL/min/{1.73_m2} (ref >59)
Glucose: 99 mg/dL (ref 65–99)
Potassium: 4 mmol/L (ref 3.5–5.2)
SODIUM: 141 mmol/L (ref 134–144)

## 2017-05-04 LAB — CBC
HEMATOCRIT: 37.4 % (ref 34.0–46.6)
HEMOGLOBIN: 13 g/dL (ref 11.1–15.9)
MCH: 30.4 pg (ref 26.6–33.0)
MCHC: 34.8 g/dL (ref 31.5–35.7)
MCV: 87 fL (ref 79–97)
Platelets: 231 10*3/uL (ref 150–379)
RBC: 4.28 x10E6/uL (ref 3.77–5.28)
RDW: 12.7 % (ref 12.3–15.4)
WBC: 4.3 10*3/uL (ref 3.4–10.8)

## 2017-05-04 LAB — LIPID PANEL
Chol/HDL Ratio: 4.2 ratio (ref 0.0–4.4)
Cholesterol, Total: 186 mg/dL (ref 100–199)
HDL: 44 mg/dL (ref >39)
LDL CALC: 69 mg/dL (ref 0–99)
Triglycerides: 365 mg/dL — ABNORMAL HIGH (ref 0–149)
VLDL CHOLESTEROL CAL: 73 mg/dL — AB (ref 5–40)

## 2017-05-04 LAB — TSH: TSH: 2.43 u[IU]/mL (ref 0.450–4.500)

## 2017-05-04 LAB — TROPONIN T

## 2017-05-04 LAB — SEDIMENTATION RATE: Sed Rate: 25 mm/hr (ref 0–40)

## 2017-05-04 MED ORDER — SIMVASTATIN 20 MG PO TABS: 20.0000 mg | ORAL_TABLET | Freq: Every day | ORAL | 3 refills | Status: DC

## 2017-05-04 MED ORDER — AMLODIPINE BESYLATE 2.5 MG PO TABS: 2.5000 mg | ORAL_TABLET | Freq: Every day | ORAL | 3 refills | Status: DC

## 2017-05-04 MED ORDER — CARVEDILOL 12.5 MG PO TABS: ORAL_TABLET | ORAL | 3 refills | Status: DC

## 2017-05-04 NOTE — Patient Instructions (Signed)
Medication Instructions:  Your physician recommends that you continue on your current medications as directed. Please refer to the Current Medication list given to you today.   Labwork: Your physician recommends that you return for lab work today (CBC, BMET, TSH, LIPIDS, TROPONIN, SED RATE)   Testing/Procedures: none  Follow-Up: Follow up with your physician will depend on test results.   Any Other Special Instructions Will Be Listed Below (If Applicable).     If you need a refill on your cardiac medications before your next appointment, please call your pharmacy.

## 2017-05-04 NOTE — Progress Notes (Signed)
Cardiology Office Note   Date:  05/04/2017   ID:  [name], DOB 06/30/1945, MRN [id number]  PCP:  [name], NP  Cardiologist:   Dorris Carnes, MD   Pt presents for f/u of NICM     History of Present Illness: [name] is a 72 y.o. female with a history of NICM (LVEF 50 to 55% in 2011), LBBB, CP  Normal myovue in 2014  I saw her in Feb 2018 She also had a history ofnear syncope and chest tightness in May 2018 She has been seen by G taylor and had ILR placed     She presents today for f/u  Has had tightnes insce Friday   Not like reflux   Occasional pain   She deneis SOB   Episodes are not worse with inspiration or movement  She did move branches in yard yesterday  This did not make symptoms worse    She is worried   Current Meds  Medication Sig  . acetaminophen (TYLENOL) 500 MG tablet Take 1,000 mg by mouth every 6 (six) hours as needed for moderate pain or headache.  Marland Kitchen amLODipine (NORVASC) 2.5 MG tablet Take 1 tablet (2.5 mg total) by mouth daily.  . Carboxymeth-Glyc-Polysorb PF (REFRESH OPTIVE MEGA-3) 0.5-1-0.5 % SOLN Place 1 drop into both eyes 6 (six) times daily.  . carvedilol (COREG) 12.5 MG tablet TAKE ONE TABLET TWICE DAILY WITH A MEAL  . Cholecalciferol (VITAMIN D3) 2000 units capsule Take 2,000 Units by mouth daily.  . enalapril (VASOTEC) 10 MG tablet Take 1 tablet (10 mg total) by mouth daily.  Marland Kitchen escitalopram (LEXAPRO) 20 MG tablet Take 20 mg daily by mouth.  . metroNIDAZOLE (METROCREAM) 0.75 % cream Apply 1 application topically once a week.   . Multiple Vitamin (MULTIVITAMIN) capsule Take 1 capsule by mouth daily.    . pantoprazole (PROTONIX) 40 MG tablet Take 1 tablet (40 mg total) by mouth 2 (two) times daily.  . promethazine (PHENERGAN) 12.5 MG tablet TAKE ONE TABLET BY MOUTH EVERY 8 HOURS AS NEEDED FOR NAUSEA/VOMITING  . simvastatin (ZOCOR) 20 MG tablet Take 1 tablet (20 mg total) by mouth at bedtime.  Marland Kitchen tiZANidine (ZANAFLEX) 2 MG tablet TAKE  ONE TABLET BY MOUTH EVERY EIGHT HOURS AS NEEDED FOR MUSCLE SPASM  . zolpidem (AMBIEN) 10 MG tablet Take 1 tablet (10 mg total) by mouth at bedtime as needed for sleep.     Allergies:   Penicillin g; Penicillins; Clindamycin; Lansoprazole; Lincomycin; Sulfa antibiotics; Sulfasalazine; Cefaclor; Cefuroxime axetil; Clindamycin/lincomycin; Latex; Lincomycin hcl; and Sulfamethoxazole-trimethoprim   Past Medical History:  Diagnosis Date  . Cardiomyopathy    nonischemic  . Chronic back pain   . Complication of anesthesia   . Erosive esophagitis    LA Classification Grade B  . Essential hypertension   . GERD (gastroesophageal reflux disease)   . Hiatal hernia   . Hyperlipidemia   . Insomnia   . MDD (major depressive disorder)   . Melanoma ([place])    basil cell, pt reports no melanoma  . Migraine   . Osteopenia   . PONV (postoperative nausea and vomiting)   . Rosacea   . Sessile colonic polyp 10/2011   adenoma    Past Surgical History:  Procedure Laterality Date  . APPENDECTOMY    . CARDIAC CATHETERIZATION    . CATARACT EXTRACTION W/PHACO Right 03/18/2016   Procedure: CATARACT EXTRACTION PHACO AND INTRAOCULAR LENS PLACEMENT (IOC);  Surgeon: Birder [name], MD;  Location:  [facility];  Service: Ophthalmology;  Laterality: Right;  [place] 45.3 AP% 22.2 CDE 10.07 Fluid pack lot # [id number]  . CATARACT EXTRACTION W/PHACO Left 04/08/2016   Procedure: CATARACT EXTRACTION PHACO AND INTRAOCULAR LENS PLACEMENT (IOC);  Surgeon: Birder [name], MD;  Location: [facility];  Service: Ophthalmology;  Laterality: Left;  [place] 00:41 AP% 15.0 CDE 6.27 Fluid pack lot # [id number]  . LOOP RECORDER INSERTION N/A 10/10/2016   Procedure: Loop Recorder Insertion;  Surgeon: Evans Lance, MD;  Location: [place] [facility];  Service: Cardiovascular;  Laterality: N/A;  . TUBAL LIGATION    . VAGINAL HYSTERECTOMY       Social History:  The patient  reports that she quit smoking about 35 years ago. she has never used  smokeless tobacco. She reports that she drinks about 1.8 oz of alcohol per week. She reports that she does not use drugs.   Family History:  The patient's family history includes Alzheimer's disease in her mother; Arthritis/Rheumatoid in her sister; Heart disease in her father.    ROS:  Please see the history of present illness. All other systems are reviewed and  Negative to the above problem except as noted.    PHYSICAL EXAM: VS:  BP 118/68   Pulse 71   Ht '5\' 2"'  (1.575 m)   Wt 133 lb (60.3 kg)   SpO2 97%   BMI 24.33 kg/m   GEN: Well nourished, well developed, in no acute distress  HEENT: normal  Neck: JVP normal  No carotid bruits, or masses Cardiac: RRR; no murmurs, rubs, or gallops,no edema  Respiratory:  clear to auscultation bilaterally, normal work of breathing Chest:   Nontender GI: soft, nontender, nondistended, + BS  No hepatomegaly  MS: no deformity Moving all extremities   Skin: warm and dry, no rash Neuro:  Strength and sensation are intact Psych: euthymic mood, full affect   EKG:  EKG is ordered today.    Lipid Panel    Component Value Date/Time   CHOL 156 07/09/2016 1027   TRIG 218.0 (H) 07/09/2016 1027   HDL 51.50 07/09/2016 1027   CHOLHDL 3 07/09/2016 1027   VLDL 43.6 (H) 07/09/2016 1027   LDLCALC 68 03/22/2015 1000   LDLDIRECT 70.0 07/09/2016 1027      Wt Readings from Last 3 Encounters:  05/04/17 133 lb (60.3 kg)  01/21/17 132 lb 9.6 oz (60.1 kg)  12/22/16 133 lb (60.3 kg)      ASSESSMENT AND PLAN:  1  Chest pain   I am not convinced cardiac   SHe is on acid inhibitor    I would try motrin   Will check CBC, ESR, troponin todyay    2  NICM   Volume status is OK    Chck labs  3  Near syncope  Follows with EP   No other spells   4  HL  Will check lpipidsd    3  LBBB    Current medicines are reviewed at length with the patient today.  The patient does not have concerns regarding medicines.  Signed, Dorris Carnes, MD  05/04/2017 9:49 AM     [facility], [place], [place]  [address] Phone: [phone]; Fax: [phone]

## 2017-05-11 ENCOUNTER — Ambulatory Visit (INDEPENDENT_AMBULATORY_CARE_PROVIDER_SITE_OTHER): Payer: Medicare HMO | Admitting: *Deleted

## 2017-05-11 DIAGNOSIS — I429 Cardiomyopathy, unspecified: Secondary | ICD-10-CM | POA: Diagnosis not present

## 2017-05-11 DIAGNOSIS — R55 Syncope and collapse: Secondary | ICD-10-CM

## 2017-05-11 NOTE — Progress Notes (Signed)
Carelink Summary Report / Loop Recorder 

## 2017-05-13 DIAGNOSIS — H16223 Keratoconjunctivitis sicca, not specified as Sjogren's, bilateral: Secondary | ICD-10-CM | POA: Diagnosis not present

## 2017-05-13 DIAGNOSIS — H01009 Unspecified blepharitis unspecified eye, unspecified eyelid: Secondary | ICD-10-CM | POA: Diagnosis not present

## 2017-05-13 DIAGNOSIS — Z961 Presence of intraocular lens: Secondary | ICD-10-CM | POA: Diagnosis not present

## 2017-05-22 DIAGNOSIS — R52 Pain, unspecified: Secondary | ICD-10-CM | POA: Diagnosis not present

## 2017-05-22 DIAGNOSIS — L570 Actinic keratosis: Secondary | ICD-10-CM | POA: Diagnosis not present

## 2017-06-02 DIAGNOSIS — D044 Carcinoma in situ of skin of scalp and neck: Secondary | ICD-10-CM | POA: Diagnosis not present

## 2017-06-03 ENCOUNTER — Encounter: Payer: Self-pay | Admitting: Internal Medicine

## 2017-06-03 ENCOUNTER — Ambulatory Visit: Payer: Medicare HMO | Admitting: Internal Medicine

## 2017-06-03 VITALS — BP 124/60 | HR 71 | Ht 62.0 in | Wt 135.0 lb

## 2017-06-03 DIAGNOSIS — R55 Syncope and collapse: Secondary | ICD-10-CM

## 2017-06-03 DIAGNOSIS — I429 Cardiomyopathy, unspecified: Secondary | ICD-10-CM

## 2017-06-03 DIAGNOSIS — I447 Left bundle-branch block, unspecified: Secondary | ICD-10-CM | POA: Diagnosis not present

## 2017-06-03 DIAGNOSIS — I5022 Chronic systolic (congestive) heart failure: Secondary | ICD-10-CM | POA: Diagnosis not present

## 2017-06-03 LAB — CUP PACEART INCLINIC DEVICE CHECK
Date Time Interrogation Session: 20190327211352
MDC IDC PG IMPLANT DT: 20180803

## 2017-06-03 MED ORDER — NITROGLYCERIN 0.4 MG SL SUBL: 0.4000 mg | SUBLINGUAL_TABLET | SUBLINGUAL | 3 refills | Status: DC | PRN

## 2017-06-03 NOTE — Progress Notes (Signed)
HPI [name] returns today for ongoing evaluation and management of syncope.  She is a pleasant 72 year old woman with a history of unexplained syncope, left bundle branch block, very mild left ventricular dysfunction, who underwent insertion of an implantable loop recorder secondary to all the above.  She denies chest pain or shortness of breath and she has not had syncope. Allergies  Allergen Reactions  . Penicillin G Swelling    Rash,  . Penicillins Anaphylaxis and Hives    Has patient had a PCN reaction causing immediate rash, facial/tongue/throat swelling, SOB or lightheadedness with hypotension: {Yes Has patient had a PCN reaction causing severe rash involving mucus membranes or skin necrosis: {no Has patient had a PCN reaction that required hospitalization {yes Has patient had a PCN reaction occurring within the last 10 years: {no If all of the above answers are "NO", then may proceed with Cephalosporin use.  . Clindamycin Itching    rash  . Lansoprazole Other (See Comments)    REACTION: headaches  . Lincomycin Itching    rash  . Sulfa Antibiotics Hives  . Sulfasalazine Hives  . Cefaclor Itching and Rash    rash  . Cefuroxime Axetil Rash and Itching    rash  . Clindamycin/Lincomycin Itching and Rash  . Latex Itching and Rash    rash  . Lincomycin Hcl Itching and Rash  . Sulfamethoxazole-Trimethoprim Rash     Current Outpatient Medications  Medication Sig Dispense Refill  . acetaminophen (TYLENOL) 500 MG tablet Take 1,000 mg by mouth every 6 (six) hours as needed for moderate pain or headache.    Marland Kitchen amLODipine (NORVASC) 2.5 MG tablet Take 1 tablet (2.5 mg total) by mouth daily. 90 tablet 3  . Carboxymeth-Glyc-Polysorb PF (REFRESH OPTIVE MEGA-3) 0.5-1-0.5 % SOLN Place 1 drop into both eyes 6 (six) times daily.    . carvedilol (COREG) 12.5 MG tablet TAKE ONE TABLET TWICE DAILY WITH A MEAL 180 tablet 3  . Cholecalciferol (VITAMIN D3) 2000 units capsule Take 2,000  Units by mouth daily.    . enalapril (VASOTEC) 10 MG tablet Take 1 tablet (10 mg total) by mouth daily. 90 tablet 3  . escitalopram (LEXAPRO) 20 MG tablet Take 20 mg daily by mouth.    . metroNIDAZOLE (METROCREAM) 0.75 % cream Apply 1 application topically once a week.     . Multiple Vitamin (MULTIVITAMIN) capsule Take 1 capsule by mouth daily.      . pantoprazole (PROTONIX) 40 MG tablet Take 1 tablet (40 mg total) by mouth 2 (two) times daily. 180 tablet 3  . promethazine (PHENERGAN) 12.5 MG tablet TAKE ONE TABLET BY MOUTH EVERY 8 HOURS AS NEEDED FOR NAUSEA/VOMITING 20 tablet 5  . simvastatin (ZOCOR) 20 MG tablet Take 1 tablet (20 mg total) by mouth at bedtime. 90 tablet 3  . tiZANidine (ZANAFLEX) 2 MG tablet TAKE ONE TABLET BY MOUTH EVERY EIGHT HOURS AS NEEDED FOR MUSCLE SPASM 30 tablet 1  . zolpidem (AMBIEN) 10 MG tablet Take 1 tablet (10 mg total) by mouth at bedtime as needed for sleep. 90 tablet 0  . nitroGLYCERIN (NITROSTAT) 0.4 MG SL tablet Place 1 tablet (0.4 mg total) under the tongue every 5 (five) minutes as needed for chest pain. 30 tablet 3   No current facility-administered medications for this visit.      Past Medical History:  Diagnosis Date  . Cardiomyopathy    nonischemic  . Chronic back pain   . Complication of anesthesia   .  Erosive esophagitis    LA Classification Grade B  . Essential hypertension   . GERD (gastroesophageal reflux disease)   . Hiatal hernia   . Hyperlipidemia   . Insomnia   . MDD (major depressive disorder)   . Melanoma ([place])    basil cell, pt reports no melanoma  . Migraine   . Osteopenia   . PONV (postoperative nausea and vomiting)   . Rosacea   . Sessile colonic polyp 10/2011   adenoma    ROS:   All systems reviewed and negative except as noted in the HPI.   Past Surgical History:  Procedure Laterality Date  . APPENDECTOMY    . CARDIAC CATHETERIZATION    . CATARACT EXTRACTION W/PHACO Right 03/18/2016   Procedure: CATARACT  EXTRACTION PHACO AND INTRAOCULAR LENS PLACEMENT (IOC);  Surgeon: Birder [name], MD;  Location: [facility];  Service: Ophthalmology;  Laterality: Right;  [place] 45.3 AP% 22.2 CDE 10.07 Fluid pack lot # [id number]  . CATARACT EXTRACTION W/PHACO Left 04/08/2016   Procedure: CATARACT EXTRACTION PHACO AND INTRAOCULAR LENS PLACEMENT (IOC);  Surgeon: Birder [name], MD;  Location: [facility];  Service: Ophthalmology;  Laterality: Left;  [place] 00:41 AP% 15.0 CDE 6.27 Fluid pack lot # [id number]  . LOOP RECORDER INSERTION N/A 10/10/2016   Procedure: Loop Recorder Insertion;  Surgeon: Evans Lance, MD;  Location: [facility];  Service: Cardiovascular;  Laterality: N/A;  . TUBAL LIGATION    . VAGINAL HYSTERECTOMY       Family History  Problem Relation Age of Onset  . Alzheimer's disease Mother   . Heart disease Father   . Arthritis/Rheumatoid Sister   . Colon cancer Neg Hx   . Stomach cancer Neg Hx   . Rectal cancer Neg Hx   . Esophageal cancer Neg Hx      Social History   Socioeconomic History  . Marital status: Widowed    Spouse name: Not on file  . Number of children: 3  . Years of education: 84  . Highest education level: Not on file  Occupational History  . Not on file  Social Needs  . Financial resource strain: Not on file  . Food insecurity:    Worry: Not on file    Inability: Not on file  . Transportation needs:    Medical: Not on file    Non-medical: Not on file  Tobacco Use  . Smoking status: Former Smoker    Last attempt to quit: 06/03/1981    Years since quitting: 36.0  . Smokeless tobacco: Never Used  Substance and Sexual Activity  . Alcohol use: Yes    Alcohol/week: 1.8 oz    Types: 3 Glasses of wine per week    Comment: Wine per night  . Drug use: No  . Sexual activity: Not on file  Lifestyle  . Physical activity:    Days per week: Not on file    Minutes per session: Not on file  . Stress: Not on file  Relationships  . Social connections:    Talks on  phone: Not on file    Gets together: Not on file    Attends religious service: Not on file    Active member of club or organization: Not on file    Attends meetings of clubs or organizations: Not on file    Relationship status: Not on file  . Intimate partner violence:    Fear of current or ex partner: Not on file    Emotionally  abused: Not on file    Physically abused: Not on file    Forced sexual activity: Not on file  Other Topics Concern  . Not on file  Social History Narrative   Lives alone in two-story home.     Her husband passed away unexpectedly.     They have three children- one is a pediatrician.   Desires CPR.   Caffeine use: Decaf coffee   Right handed     BP 124/60   Pulse 71   Ht 5\' 2"  (1.575 m)   Wt 135 lb (61.2 kg)   SpO2 98%   BMI 24.69 kg/m   Physical Exam:  Well appearing 72 year old woman, NAD HEENT: Unremarkable Neck:  No JVD, no thyromegally Lymphatics:  No adenopathy Back:  No CVA tenderness Lungs:  Clear HEART:  Regular rate rhythm, no murmurs, no rubs, no clicks Abd:  soft, positive bowel sounds, no organomegally, no rebound, no guarding Ext:  2 plus pulses, no edema, no cyanosis, no clubbing Skin:  No rashes no nodules Neuro:  CN II through XII intact, motor grossly intact   DEVICE  Normal device function.  See PaceArt for details.   Assess/Plan: 1.  Syncope -the patient has had no recurrent symptoms since her loop recorder was placed.  She will undergo watchful waiting. 2.  Left bundle branch block -she is asymptomatic.  Again no progression of her heart block is noted with her loop recorder 3.  Chronic systolic heart failure -she has class I symptoms and her ejection fraction by echo was 45%.  She has had no worsening of her symptoms.  She will continue her current medications and maintain a low-sodium diet.  Crissie Sickles, MD

## 2017-06-03 NOTE — Patient Instructions (Signed)

## 2017-06-05 DIAGNOSIS — L570 Actinic keratosis: Secondary | ICD-10-CM | POA: Diagnosis not present

## 2017-06-05 DIAGNOSIS — L821 Other seborrheic keratosis: Secondary | ICD-10-CM | POA: Diagnosis not present

## 2017-06-05 DIAGNOSIS — Z85828 Personal history of other malignant neoplasm of skin: Secondary | ICD-10-CM | POA: Diagnosis not present

## 2017-06-15 ENCOUNTER — Ambulatory Visit (INDEPENDENT_AMBULATORY_CARE_PROVIDER_SITE_OTHER): Payer: Medicare HMO | Admitting: *Deleted

## 2017-06-15 DIAGNOSIS — R55 Syncope and collapse: Secondary | ICD-10-CM

## 2017-06-16 NOTE — Progress Notes (Signed)
Carelink Summary Report / Loop Recorder 

## 2017-06-19 ENCOUNTER — Other Ambulatory Visit: Payer: Self-pay | Admitting: Family Medicine

## 2017-06-19 DIAGNOSIS — F331 Major depressive disorder, recurrent, moderate: Secondary | ICD-10-CM

## 2017-06-22 ENCOUNTER — Encounter: Payer: Self-pay | Admitting: Primary Care

## 2017-06-22 ENCOUNTER — Ambulatory Visit (INDEPENDENT_AMBULATORY_CARE_PROVIDER_SITE_OTHER): Payer: Medicare HMO | Admitting: Primary Care

## 2017-06-22 DIAGNOSIS — K2100 Gastro-esophageal reflux disease with esophagitis, without bleeding: Secondary | ICD-10-CM

## 2017-06-22 DIAGNOSIS — G4700 Insomnia, unspecified: Secondary | ICD-10-CM | POA: Diagnosis not present

## 2017-06-22 DIAGNOSIS — R55 Syncope and collapse: Secondary | ICD-10-CM

## 2017-06-22 DIAGNOSIS — E7800 Pure hypercholesterolemia, unspecified: Secondary | ICD-10-CM | POA: Diagnosis not present

## 2017-06-22 DIAGNOSIS — F331 Major depressive disorder, recurrent, moderate: Secondary | ICD-10-CM

## 2017-06-22 MED ORDER — ESCITALOPRAM OXALATE 20 MG PO TABS: 20.0000 mg | ORAL_TABLET | Freq: Every day | ORAL | 3 refills | Status: DC

## 2017-06-22 MED ORDER — ZOLPIDEM TARTRATE 10 MG PO TABS: 10.0000 mg | ORAL_TABLET | Freq: Every evening | ORAL | 0 refills | Status: DC | PRN

## 2017-06-22 NOTE — Progress Notes (Signed)
Subjective:    Patient ID: [name], female    DOB: 1945/11/10, 72 y.o.   MRN: [id number]  HPI  [name] is a 72 year old female who presents today for follow up.  1) Insomnia: Currently managed on Ambien 10 mg nightly and is doing well. Her insurance is not covering this prescription but she would like to continue the prescription. She tried not taking Ambien for a few nights and did not sleep.  2) Essential Hypertension/Syncope: Currently managed on carvedilol 12.5 mg BID, enalapril 10 mg, Amlodipine 2.5 mg. History of surgical loop recorder insertion in August 2018 due to unexplained syncope. History of left bundle branch block, mild left ventricular dysfunction. She saw her cardiologist in March 2019, no symptoms of syncope, chest pain, dizziness. She does have a prescription for nitroglycerin for which she's not used.   3) Hyperlipidemia: Currently managed on simvastatin 20 mg. Her last lipid panel was in February 2019 with TC of 186, Trigs of 365, LDL of 69. She has a long history of hypertriglyceridemia, she was told never to take Fish Oil.  4) Depression: Currently managed on Lexapro 20 mg. She is doing well. She denies SI/HI. She is needed refills today.  5) GERD: Currently managed on pantoprazole 40 mg BID. She denies breakthrough GERD symptoms. Following with GI and due for follow up visit.  Review of Systems  Constitutional: Negative for fatigue.  Respiratory: Negative for shortness of breath.   Cardiovascular: Negative for chest pain.  Neurological: Negative for dizziness and headaches.  Psychiatric/Behavioral: Negative for sleep disturbance and suicidal ideas.       Past Medical History:  Diagnosis Date  . Cardiomyopathy    nonischemic  . Chronic back pain   . Complication of anesthesia   . Erosive esophagitis    LA Classification Grade B  . Essential hypertension   . GERD (gastroesophageal reflux disease)   . Hiatal hernia   . Hyperlipidemia   .  Insomnia   . MDD (major depressive disorder)   . Melanoma ([place])    basil cell, pt reports no melanoma  . Migraine   . Osteopenia   . PONV (postoperative nausea and vomiting)   . Rosacea   . Sessile colonic polyp 10/2011   adenoma     Social History   Socioeconomic History  . Marital status: Widowed    Spouse name: Not on file  . Number of children: 3  . Years of education: 70  . Highest education level: Not on file  Occupational History  . Not on file  Social Needs  . Financial resource strain: Not on file  . Food insecurity:    Worry: Not on file    Inability: Not on file  . Transportation needs:    Medical: Not on file    Non-medical: Not on file  Tobacco Use  . Smoking status: Former Smoker    Last attempt to quit: 06/03/1981    Years since quitting: 36.0  . Smokeless tobacco: Never Used  Substance and Sexual Activity  . Alcohol use: Yes    Alcohol/week: 1.8 oz    Types: 3 Glasses of wine per week    Comment: Wine per night  . Drug use: No  . Sexual activity: Not on file  Lifestyle  . Physical activity:    Days per week: Not on file    Minutes per session: Not on file  . Stress: Not on file  Relationships  . Social connections:  Talks on phone: Not on file    Gets together: Not on file    Attends religious service: Not on file    Active member of club or organization: Not on file    Attends meetings of clubs or organizations: Not on file    Relationship status: Not on file  . Intimate partner violence:    Fear of current or ex partner: Not on file    Emotionally abused: Not on file    Physically abused: Not on file    Forced sexual activity: Not on file  Other Topics Concern  . Not on file  Social History Narrative   Lives alone in two-story home.     Her husband passed away unexpectedly.     They have three children- one is a pediatrician.   Desires CPR.   Caffeine use: Decaf coffee   Right handed    Past Surgical History:  Procedure  Laterality Date  . APPENDECTOMY    . CARDIAC CATHETERIZATION    . CATARACT EXTRACTION W/PHACO Right 03/18/2016   Procedure: CATARACT EXTRACTION PHACO AND INTRAOCULAR LENS PLACEMENT (IOC);  Surgeon: Birder [name], MD;  Location: [facility];  Service: Ophthalmology;  Laterality: Right;  [place] 45.3 AP% 22.2 CDE 10.07 Fluid pack lot # [id number]  . CATARACT EXTRACTION W/PHACO Left 04/08/2016   Procedure: CATARACT EXTRACTION PHACO AND INTRAOCULAR LENS PLACEMENT (IOC);  Surgeon: Birder [name], MD;  Location: [facility];  Service: Ophthalmology;  Laterality: Left;  [place] 00:41 AP% 15.0 CDE 6.27 Fluid pack lot # [id number]  . LOOP RECORDER INSERTION N/A 10/10/2016   Procedure: Loop Recorder Insertion;  Surgeon: Evans Lance, MD;  Location: [facility];  Service: Cardiovascular;  Laterality: N/A;  . TUBAL LIGATION    . VAGINAL HYSTERECTOMY      Family History  Problem Relation Age of Onset  . Alzheimer's disease Mother   . Heart disease Father   . Arthritis/Rheumatoid Sister   . Colon cancer Neg Hx   . Stomach cancer Neg Hx   . Rectal cancer Neg Hx   . Esophageal cancer Neg Hx     Allergies  Allergen Reactions  . Penicillin G Swelling    Rash,  . Penicillins Anaphylaxis and Hives    Has patient had a PCN reaction causing immediate rash, facial/tongue/throat swelling, SOB or lightheadedness with hypotension: {Yes Has patient had a PCN reaction causing severe rash involving mucus membranes or skin necrosis: {no Has patient had a PCN reaction that required hospitalization {yes Has patient had a PCN reaction occurring within the last 10 years: {no If all of the above answers are "NO", then may proceed with Cephalosporin use.  . Clindamycin Itching    rash  . Lansoprazole Other (See Comments)    REACTION: headaches  . Lincomycin Itching    rash  . Sulfa Antibiotics Hives  . Sulfasalazine Hives  . Cefaclor Itching and Rash    rash  . Cefuroxime Axetil Rash and Itching    rash  .  Clindamycin/Lincomycin Itching and Rash  . Latex Itching and Rash    rash  . Lincomycin Hcl Itching and Rash  . Sulfamethoxazole-Trimethoprim Rash    Current Outpatient Medications on File Prior to Visit  Medication Sig Dispense Refill  . acetaminophen (TYLENOL) 500 MG tablet Take 1,000 mg by mouth every 6 (six) hours as needed for moderate pain or headache.    Marland Kitchen amLODipine (NORVASC) 2.5 MG tablet Take 1 tablet (2.5 mg total) by mouth daily.  90 tablet 3  . Carboxymeth-Glyc-Polysorb PF (REFRESH OPTIVE MEGA-3) 0.5-1-0.5 % SOLN Place 1 drop into both eyes 6 (six) times daily.    . carvedilol (COREG) 12.5 MG tablet TAKE ONE TABLET TWICE DAILY WITH A MEAL 180 tablet 3  . Cholecalciferol (VITAMIN D3) 2000 units capsule Take 2,000 Units by mouth daily.    . enalapril (VASOTEC) 10 MG tablet Take 1 tablet (10 mg total) by mouth daily. 90 tablet 3  . escitalopram (LEXAPRO) 20 MG tablet TAKE ONE TABLET EVERY DAY 90 tablet 1  . metroNIDAZOLE (METROCREAM) 0.75 % cream Apply 1 application topically once a week.     . Multiple Vitamin (MULTIVITAMIN) capsule Take 1 capsule by mouth daily.      . nitroGLYCERIN (NITROSTAT) 0.4 MG SL tablet Place 1 tablet (0.4 mg total) under the tongue every 5 (five) minutes as needed for chest pain. 30 tablet 3  . pantoprazole (PROTONIX) 40 MG tablet Take 1 tablet (40 mg total) by mouth 2 (two) times daily. 180 tablet 3  . promethazine (PHENERGAN) 12.5 MG tablet TAKE ONE TABLET BY MOUTH EVERY 8 HOURS AS NEEDED FOR NAUSEA/VOMITING 20 tablet 5  . simvastatin (ZOCOR) 20 MG tablet Take 1 tablet (20 mg total) by mouth at bedtime. 90 tablet 3  . zolpidem (AMBIEN) 10 MG tablet Take 1 tablet (10 mg total) by mouth at bedtime as needed for sleep. 90 tablet 0   No current facility-administered medications on file prior to visit.     BP 122/72   Pulse 62   Temp 98 F (36.7 C) (Oral)   Ht 5\' 2"  (1.575 m)   Wt 135 lb (61.2 kg)   SpO2 98%   BMI 24.69 kg/m    Objective:    Physical Exam  Constitutional: She appears well-nourished.  Neck: Neck supple.  Cardiovascular: Normal rate and regular rhythm.  Pulmonary/Chest: Effort normal and breath sounds normal.  Skin: Skin is warm and dry.  Psychiatric: She has a normal mood and affect.          Assessment & Plan:

## 2017-06-22 NOTE — Assessment & Plan Note (Signed)
Doing well on pantoprazole 40 mg BID, continue same. Follows with GI.

## 2017-06-22 NOTE — Assessment & Plan Note (Signed)
Lipid panel from February 2019 overall stable. Discussed to watch diet with triglyceride levels. Continue statin.

## 2017-06-22 NOTE — Patient Instructions (Addendum)
I sent refills for Ambien and Lexapro to your pharmacy.  We will see you for your physical in August as scheduled.  It was a pleasure to see you today!

## 2017-06-22 NOTE — Assessment & Plan Note (Signed)
Doing well on Lexapro, continue same. Refills sent to pharmacy. 

## 2017-06-22 NOTE — Assessment & Plan Note (Signed)
Loop recorder insertion in August 2018, no recent episodes of syncope. Continue ACE, beta blocker, calcium channel blocker.

## 2017-06-22 NOTE — Assessment & Plan Note (Addendum)
Doing well on Ambien, continue same. Refills sent to pharmacy. UDS is up to date.

## 2017-06-23 LAB — CUP PACEART REMOTE DEVICE CHECK
Date Time Interrogation Session: 20190304174110
Implantable Pulse Generator Implant Date: 20180803
Implantable Pulse Generator Implant Date: 20180803
MDC IDC SESS DTM: 20190406183907

## 2017-07-01 DIAGNOSIS — H01009 Unspecified blepharitis unspecified eye, unspecified eyelid: Secondary | ICD-10-CM | POA: Diagnosis not present

## 2017-07-01 DIAGNOSIS — H16223 Keratoconjunctivitis sicca, not specified as Sjogren's, bilateral: Secondary | ICD-10-CM | POA: Diagnosis not present

## 2017-07-01 DIAGNOSIS — Z961 Presence of intraocular lens: Secondary | ICD-10-CM | POA: Diagnosis not present

## 2017-07-16 ENCOUNTER — Ambulatory Visit (INDEPENDENT_AMBULATORY_CARE_PROVIDER_SITE_OTHER): Payer: Medicare HMO | Admitting: *Deleted

## 2017-07-16 DIAGNOSIS — R55 Syncope and collapse: Secondary | ICD-10-CM | POA: Diagnosis not present

## 2017-07-20 DIAGNOSIS — M62830 Muscle spasm of back: Secondary | ICD-10-CM | POA: Diagnosis not present

## 2017-07-20 DIAGNOSIS — M545 Low back pain: Secondary | ICD-10-CM | POA: Diagnosis not present

## 2017-07-20 DIAGNOSIS — M47816 Spondylosis without myelopathy or radiculopathy, lumbar region: Secondary | ICD-10-CM | POA: Diagnosis not present

## 2017-07-20 NOTE — Progress Notes (Signed)
Carelink Summary Report / Loop Recorder 

## 2017-07-29 ENCOUNTER — Encounter: Payer: Medicare HMO | Admitting: Internal Medicine

## 2017-08-10 ENCOUNTER — Other Ambulatory Visit: Payer: Self-pay | Admitting: Gastroenterology

## 2017-08-10 ENCOUNTER — Telehealth: Payer: Self-pay | Admitting: Gastroenterology

## 2017-08-10 LAB — CUP PACEART REMOTE DEVICE CHECK
Date Time Interrogation Session: 20190509201019
MDC IDC PG IMPLANT DT: 20180803

## 2017-08-10 MED ORDER — PANTOPRAZOLE SODIUM 40 MG PO TBEC: 40.0000 mg | DELAYED_RELEASE_TABLET | Freq: Two times a day (BID) | ORAL | 0 refills | Status: DC

## 2017-08-10 NOTE — Telephone Encounter (Signed)
Patient states that Rx needs to be sent to [facility] on [address] in [place]. This is her new pharmacy now.

## 2017-08-10 NOTE — Telephone Encounter (Signed)
Prescription sent to patient's pharmacy and patient notified. Patient verbalized understanding. 

## 2017-08-11 DIAGNOSIS — M47816 Spondylosis without myelopathy or radiculopathy, lumbar region: Secondary | ICD-10-CM | POA: Diagnosis not present

## 2017-08-11 MED ORDER — PANTOPRAZOLE SODIUM 40 MG PO TBEC: 40.0000 mg | DELAYED_RELEASE_TABLET | Freq: Two times a day (BID) | ORAL | 0 refills | Status: DC

## 2017-08-11 NOTE — Telephone Encounter (Signed)
Prescription sent to [facility] per patient's request.

## 2017-08-11 NOTE — Addendum Note (Signed)
Addended by: Marzella Schlein on: 08/11/2017 09:06 AM   Modules accepted: Orders

## 2017-08-18 ENCOUNTER — Ambulatory Visit (INDEPENDENT_AMBULATORY_CARE_PROVIDER_SITE_OTHER): Payer: Medicare HMO | Admitting: *Deleted

## 2017-08-18 DIAGNOSIS — R55 Syncope and collapse: Secondary | ICD-10-CM | POA: Diagnosis not present

## 2017-08-19 NOTE — Progress Notes (Signed)
Carelink Summary Report / Loop Recorder 

## 2017-08-25 ENCOUNTER — Encounter: Payer: Self-pay | Admitting: Physician Assistant

## 2017-08-25 ENCOUNTER — Ambulatory Visit: Payer: Medicare HMO | Admitting: Physician Assistant

## 2017-08-25 VITALS — BP 100/62 | HR 67 | Ht 62.0 in | Wt 134.0 lb

## 2017-08-25 DIAGNOSIS — K219 Gastro-esophageal reflux disease without esophagitis: Secondary | ICD-10-CM | POA: Diagnosis not present

## 2017-08-25 DIAGNOSIS — Z86010 Personal history of colonic polyps: Secondary | ICD-10-CM

## 2017-08-25 MED ORDER — PANTOPRAZOLE SODIUM 40 MG PO TBEC: 40.0000 mg | DELAYED_RELEASE_TABLET | Freq: Two times a day (BID) | ORAL | 3 refills | Status: DC

## 2017-08-25 NOTE — Progress Notes (Signed)
Reviewed and agree with management plan.  Malcolm T. Stark, MD FACG 

## 2017-08-25 NOTE — Progress Notes (Signed)
Chief Complaint: Medication refill for Protonix  HPI:    [name] is a 72 year old female with a past medical history as listed below including cardiomyopathy (08/13/2016 LVEF 40-45%), GERD and erosive esophagitis, who follows with Dr. Fuller Plan, and presents clinic today for a refill of her Protonix.    10/10/2011 colonoscopy with finding of a 7 mm polyps in the ascending colon.  Path revealed a sessile serrated polyp with no dysplasia.  Repeat was recommended in 5 years.    07/02/2016 office visit Dr. Fuller Plan, seen for nausea and vomiting for 1 day and left upper quadrant and lateral chest pain.  It was thought her symptoms were musculoskeletal as recommended she use Tylenol and a heating pad.  She was continued on Pantoprazole 40 mg twice a day.  As noted her surveillance colonoscopy was due 10/2016 for her history of adenomatous polyps.    Today, explains that her reflux is well controlled as long as she takes her Pantoprazole 40 mg twice daily.  If she misses a dose at all she feels severe chest pain and reflux "almost like I am having a heart attack".  Patient does ask the possible side effects of being on this medication as she has been on it for over 15 years now and already has osteopenia.  Though she is aware of the possible problems of coming off of it including esophageal cancer.    Patient also aware she is due for colonoscopy.  Would like to schedule today.    Denies fever, chills, weight loss, blood in her stool, melena, change in bowel habits or abdominal pain.  Past Medical History:  Diagnosis Date  . Cardiomyopathy    nonischemic  . Chronic back pain   . Complication of anesthesia   . Erosive esophagitis    LA Classification Grade B  . Essential hypertension   . GERD (gastroesophageal reflux disease)   . Hiatal hernia   . Hyperlipidemia   . Insomnia   . MDD (major depressive disorder)   . Melanoma ([place])    basil cell, pt reports no melanoma  . Migraine   . Osteopenia   . PONV  (postoperative nausea and vomiting)   . Rosacea   . Sessile colonic polyp 10/2011   adenoma    Past Surgical History:  Procedure Laterality Date  . APPENDECTOMY    . CARDIAC CATHETERIZATION    . CATARACT EXTRACTION W/PHACO Right 03/18/2016   Procedure: CATARACT EXTRACTION PHACO AND INTRAOCULAR LENS PLACEMENT (IOC);  Surgeon: Birder [name], MD;  Location: [facility];  Service: Ophthalmology;  Laterality: Right;  [place] 45.3 AP% 22.2 CDE 10.07 Fluid pack lot # [id number]  . CATARACT EXTRACTION W/PHACO Left 04/08/2016   Procedure: CATARACT EXTRACTION PHACO AND INTRAOCULAR LENS PLACEMENT (IOC);  Surgeon: Birder [name], MD;  Location: [facility] OR[facility];  Service: Ophthalmology;  Laterality: Left;  [place] 00:41 AP% 15.0 CDE 6.27 Fluid pack lot # [id number]  . LOOP RECORDER INSERTION N/A 10/10/2016   Procedure: Loop Recorder Insertion;  Surgeon: Evans Lance, MD;  Location: [facility];  Service: Cardiovascular;  Laterality: N/A;  . TUBAL LIGATION    . VAGINAL HYSTERECTOMY      Current Outpatient Medications  Medication Sig Dispense Refill  . acetaminophen (TYLENOL) 500 MG tablet Take 1,000 mg by mouth every 6 (six) hours as needed for moderate pain or headache.    Marland Kitchen amLODipine (NORVASC) 2.5 MG tablet Take 1 tablet (2.5 mg total) by mouth daily. 90 tablet 3  .  Carboxymeth-Glyc-Polysorb PF (REFRESH OPTIVE MEGA-3) 0.5-1-0.5 % SOLN Place 1 drop into both eyes 6 (six) times daily.    . carvedilol (COREG) 12.5 MG tablet TAKE ONE TABLET TWICE DAILY WITH A MEAL 180 tablet 3  . Cholecalciferol (VITAMIN D3) 2000 units capsule Take 2,000 Units by mouth daily.    . enalapril (VASOTEC) 10 MG tablet Take 1 tablet (10 mg total) by mouth daily. 90 tablet 3  . escitalopram (LEXAPRO) 20 MG tablet Take 1 tablet (20 mg total) by mouth daily. 90 tablet 3  . metroNIDAZOLE (METROCREAM) 0.75 % cream Apply 1 application topically once a week.     . Multiple Vitamin (MULTIVITAMIN) capsule Take 1 capsule by mouth daily.        . nitroGLYCERIN (NITROSTAT) 0.4 MG SL tablet Place 1 tablet (0.4 mg total) under the tongue every 5 (five) minutes as needed for chest pain. 30 tablet 3  . pantoprazole (PROTONIX) 40 MG tablet Take 1 tablet (40 mg total) by mouth 2 (two) times daily. 60 tablet 0  . promethazine (PHENERGAN) 12.5 MG tablet TAKE ONE TABLET BY MOUTH EVERY 8 HOURS AS NEEDED FOR NAUSEA/VOMITING 20 tablet 5  . simvastatin (ZOCOR) 20 MG tablet Take 1 tablet (20 mg total) by mouth at bedtime. 90 tablet 3  . zolpidem (AMBIEN) 10 MG tablet Take 1 tablet (10 mg total) by mouth at bedtime as needed for sleep. 90 tablet 0   No current facility-administered medications for this visit.     Allergies as of 08/25/2017 - Review Complete 06/22/2017  Allergen Reaction Noted  . Penicillin g Swelling 12/30/2016  . Penicillins Anaphylaxis and Hives   . Clindamycin Itching 12/30/2016  . Lansoprazole Other (See Comments) 12/30/2016  . Lincomycin Itching 12/30/2016  . Sulfa antibiotics Hives 07/15/2010  . Sulfasalazine Hives 07/15/2010  . Cefaclor Itching and Rash 09/12/2011  . Cefuroxime axetil Rash and Itching 07/15/2010  . Clindamycin/lincomycin Itching and Rash 09/12/2011  . Latex Itching and Rash 06/28/2009  . Lincomycin hcl Itching and Rash 06/04/2015  . Sulfamethoxazole-trimethoprim Rash 09/12/2011    Family History  Problem Relation Age of Onset  . Alzheimer's disease Mother   . Heart disease Father   . Arthritis/Rheumatoid Sister   . Colon cancer Neg Hx   . Stomach cancer Neg Hx   . Rectal cancer Neg Hx   . Esophageal cancer Neg Hx     Social History   Socioeconomic History  . Marital status: Widowed    Spouse name: Not on file  . Number of children: 3  . Years of education: 23  . Highest education level: Not on file  Occupational History  . Not on file  Social Needs  . Financial resource strain: Not on file  . Food insecurity:    Worry: Not on file    Inability: Not on file  . Transportation  needs:    Medical: Not on file    Non-medical: Not on file  Tobacco Use  . Smoking status: Former Smoker    Last attempt to quit: 06/03/1981    Years since quitting: 36.2  . Smokeless tobacco: Never Used  Substance and Sexual Activity  . Alcohol use: Yes    Alcohol/week: 1.8 oz    Types: 3 Glasses of wine per week    Comment: Wine per night  . Drug use: No  . Sexual activity: Not on file  Lifestyle  . Physical activity:    Days per week: Not on file    Minutes  per session: Not on file  . Stress: Not on file  Relationships  . Social connections:    Talks on phone: Not on file    Gets together: Not on file    Attends religious service: Not on file    Active member of club or organization: Not on file    Attends meetings of clubs or organizations: Not on file    Relationship status: Not on file  . Intimate partner violence:    Fear of current or ex partner: Not on file    Emotionally abused: Not on file    Physically abused: Not on file    Forced sexual activity: Not on file  Other Topics Concern  . Not on file  Social History Narrative   Lives alone in two-story home.     Her husband passed away unexpectedly.     They have three children- one is a pediatrician.   Desires CPR.   Caffeine use: Decaf coffee   Right handed    Review of Systems:    Constitutional: No weight loss, fever or chills Skin: No rash  Cardiovascular: No chest pain Respiratory: No SOB  Gastrointestinal: See HPI and otherwise negative Genitourinary: No dysuria Neurological: No headache Musculoskeletal: No new muscle or joint pain Hematologic: No bleeding  Psychiatric: No history of depression or anxiety   Physical Exam:  Vital signs: BP 100/62   Pulse 67   Ht 5\' 2"  (1.575 m)   Wt 134 lb (60.8 kg)   BMI 24.51 kg/m   Constitutional:   Pleasant Caucasian female appears to be in NAD, Well developed, Well nourished, alert and cooperative Head:  Normocephalic and atraumatic. Eyes:   PEERL,  EOMI. No icterus. Conjunctiva pink. Ears:  Normal auditory acuity. Neck:  Supple Throat: Oral cavity and pharynx without inflammation, swelling or lesion.  Respiratory: Respirations even and unlabored. Lungs clear to auscultation bilaterally.   No wheezes, crackles, or rhonchi.  Cardiovascular: Normal S1, S2. No MRG. Regular rate and rhythm. No peripheral edema, cyanosis or pallor.  Gastrointestinal:  Soft, nondistended, nontender. No rebound or guarding. Normal bowel sounds. No appreciable masses or hepatomegaly. Rectal:  Not performed.  Msk:  Symmetrical without gross deformities. Without edema, no deformity or joint abnormality.  Neurologic:  Alert and  oriented x4;  grossly normal neurologically.  Skin:   Dry and intact without significant lesions or rashes. Psychiatric:  Demonstrates good judgement and reason without abnormal affect or behaviors.  MOST RECENT LABS AND IMAGING: CBC    Component Value Date/Time   WBC 4.3 05/04/2017 1024   WBC 6.2 12/22/2016 0937   RBC 4.28 05/04/2017 1024   RBC 4.25 12/22/2016 0937   HGB 13.0 05/04/2017 1024   HCT 37.4 05/04/2017 1024   PLT 231 05/04/2017 1024   MCV 87 05/04/2017 1024   MCH 30.4 05/04/2017 1024   MCH 29.8 03/22/2015 1000   MCHC 34.8 05/04/2017 1024   MCHC 33.4 12/22/2016 0937   RDW 12.7 05/04/2017 1024   LYMPHSABS 1.3 12/22/2016 0937   MONOABS 0.5 12/22/2016 0937   EOSABS 0.3 12/22/2016 0937   BASOSABS 0.1 12/22/2016 0937    CMP     Component Value Date/Time   NA 141 05/04/2017 1024   K 4.0 05/04/2017 1024   CL 102 05/04/2017 1024   CO2 23 05/04/2017 1024   GLUCOSE 99 05/04/2017 1024   GLUCOSE 105 (H) 12/22/2016 0937   BUN 13 05/04/2017 1024   CREATININE 0.66 05/04/2017 1024   CREATININE  0.56 (L) 03/22/2015 1000   CALCIUM 9.6 05/04/2017 1024   PROT 7.2 12/22/2016 0937   ALBUMIN 4.4 12/22/2016 0937   AST 19 12/22/2016 0937   ALT 16 12/22/2016 0937   ALKPHOS 64 12/22/2016 0937   BILITOT 0.6 12/22/2016 0937    GFRNONAA 89 05/04/2017 1024   GFRAA 102 05/04/2017 1024    Assessment: 1.  GERD: Controlled on Pantoprazole 40 mg twice daily 2.  History of adenomatous polyp: Last colonoscopy in 2013 with recommendations to repeat in 5 years, patient due now  Plan: 1.  Scheduled patient for a surveillance colonoscopy in the [place] with Dr. Fuller Plan due to her history of adenomatous polyps.  Did discuss risk, benefits, limitations and alternatives and the patient agrees to proceed. 2.  Refilled patient's Pantoprazole 40 mg twice daily, 30-60 minutes before eating breakfast and dinner # 180 with 3 refills 3.  Discussed side effects of long-term PPI use.  Explained that in her case the benefits outweigh the risks, including possible esophageal cancer or other. 4.  Patient to follow in clinic per recommendations from Dr. Fuller Plan after time of procedure.  Ellouise Newer, PA-C [facility] 08/25/2017, 9:11 AM  Cc: [name], NP

## 2017-08-25 NOTE — Patient Instructions (Addendum)
If you are age 73 or older, your body mass index should be between 23-30. Your Body mass index is 24.51 kg/m. If this is out of the aforementioned range listed, please consider follow up with your Primary Care Provider.  If you are age 30 or younger, your body mass index should be between 19-25. Your Body mass index is 24.51 kg/m. If this is out of the aformentioned range listed, please consider follow up with your Primary Care Provider.   We will call you when the schedule is out for September to schedule your colonoscopy.  Thank you for choosing me and [facility].   Ellouise Newer, PA-C

## 2017-09-02 DIAGNOSIS — H5203 Hypermetropia, bilateral: Secondary | ICD-10-CM | POA: Diagnosis not present

## 2017-09-15 ENCOUNTER — Other Ambulatory Visit: Payer: Self-pay

## 2017-09-15 MED ORDER — ENALAPRIL MALEATE 10 MG PO TABS: 10.0000 mg | ORAL_TABLET | Freq: Every day | ORAL | 1 refills | Status: DC

## 2017-09-21 ENCOUNTER — Ambulatory Visit (INDEPENDENT_AMBULATORY_CARE_PROVIDER_SITE_OTHER): Payer: Medicare HMO | Admitting: *Deleted

## 2017-09-21 DIAGNOSIS — R55 Syncope and collapse: Secondary | ICD-10-CM | POA: Diagnosis not present

## 2017-09-21 NOTE — Progress Notes (Signed)
Carelink Summary Report / Loop Recorder 

## 2017-09-24 ENCOUNTER — Ambulatory Visit: Payer: Medicare HMO

## 2017-09-24 LAB — CUP PACEART REMOTE DEVICE CHECK
Date Time Interrogation Session: 20190611203918
Implantable Pulse Generator Implant Date: 20180803

## 2017-09-28 DIAGNOSIS — Z1231 Encounter for screening mammogram for malignant neoplasm of breast: Secondary | ICD-10-CM | POA: Diagnosis not present

## 2017-09-28 DIAGNOSIS — M8589 Other specified disorders of bone density and structure, multiple sites: Secondary | ICD-10-CM | POA: Diagnosis not present

## 2017-10-01 ENCOUNTER — Ambulatory Visit (INDEPENDENT_AMBULATORY_CARE_PROVIDER_SITE_OTHER): Payer: Medicare HMO | Admitting: Family

## 2017-10-01 ENCOUNTER — Encounter: Payer: Self-pay | Admitting: Family

## 2017-10-01 ENCOUNTER — Ambulatory Visit: Payer: Medicare HMO | Admitting: Family Medicine

## 2017-10-01 VITALS — BP 124/66 | HR 70 | Temp 98.2°F | Resp 16 | Ht 62.0 in | Wt 136.0 lb

## 2017-10-01 DIAGNOSIS — R059 Cough, unspecified: Secondary | ICD-10-CM

## 2017-10-01 DIAGNOSIS — R05 Cough: Secondary | ICD-10-CM

## 2017-10-01 NOTE — Patient Instructions (Signed)
Suspect viral as discussed.   Trial of flonase, nasal saline spray   Let me know right away if you are not getting better with conservative management.

## 2017-10-01 NOTE — Progress Notes (Signed)
Subjective:    Patient ID: [name], female    DOB: [birth year], 72 y.o.   MRN: [id number]  CC: [name] is a 72 y.o. female who presents today for an acute visit.    HPI: CC:dry cough, 'dull' headache x3 days, unchanged. 'Feels like summer cold, thinks viral.'   Started with cough.  She also endorses sore throat. . Bilateral ear pain which started last night. Cough is at night and day.  Taking tylenol without much relief. She has not been around young children recently and sore throat 'doesn't feel like strep.' No known strep exposure.   Children wanted her to be seen.   No nasal discharge, wheezing, sob, chest pain, orthopnea.  Former smoker  No seasonal allergies.   H/o cardiomyopathy; wearing loop recorder; follows with Dr Lovena Le and Dr Harrington Challenger  No h/o CHF.     HISTORY:  Past Medical History:  Diagnosis Date  . Cardiomyopathy    nonischemic  . Chronic back pain   . Complication of anesthesia   . Erosive esophagitis    LA Classification Grade B  . Essential hypertension   . GERD (gastroesophageal reflux disease)   . Hiatal hernia   . Hyperlipidemia   . Insomnia   . MDD (major depressive disorder)   . Melanoma ([place])    basil cell, pt reports no melanoma  . Migraine   . Osteopenia   . PONV (postoperative nausea and vomiting)   . Rosacea   . Sessile colonic polyp 10/2011   adenoma  . Squamous carcinoma    scalp   Past Surgical History:  Procedure Laterality Date  . APPENDECTOMY    . CARDIAC CATHETERIZATION    . CATARACT EXTRACTION W/PHACO Right 03/18/2016   Procedure: CATARACT EXTRACTION PHACO AND INTRAOCULAR LENS PLACEMENT (IOC);  Surgeon: Birder [name], MD;  Location: [facility] ORS;  Service: Ophthalmology;  Laterality: Right;  [place] 45.3 AP% 22.2 CDE 10.07 Fluid pack lot # [id number]  . CATARACT EXTRACTION W/PHACO Left 04/08/2016   Procedure: CATARACT EXTRACTION PHACO AND INTRAOCULAR LENS PLACEMENT (IOC);  Surgeon: Birder [name], MD;  Location: [facility]  ORS;  Service: Ophthalmology;  Laterality: Left;  [place] 00:41 AP% 15.0 CDE 6.27 Fluid pack lot # [id number]  . LOOP RECORDER INSERTION N/A 10/10/2016   Procedure: Loop Recorder Insertion;  Surgeon: Evans Lance, MD;  Location: [facility];  Service: Cardiovascular;  Laterality: N/A;  . SKIN CANCER EXCISION     scalp  . TUBAL LIGATION    . VAGINAL HYSTERECTOMY     Family History  Problem Relation Age of Onset  . Alzheimer's disease Mother   . Heart disease Father   . Arthritis/Rheumatoid Sister   . Colon cancer Neg Hx   . Stomach cancer Neg Hx   . Rectal cancer Neg Hx   . Esophageal cancer Neg Hx     Allergies: Penicillin g; Penicillins; Clindamycin; Lansoprazole; Lincomycin; Sulfa antibiotics; Sulfasalazine; Cefaclor; Cefuroxime axetil; Clindamycin/lincomycin; Latex; Lincomycin hcl; and Sulfamethoxazole-trimethoprim Current Outpatient Medications on File Prior to Visit  Medication Sig Dispense Refill  . acetaminophen (TYLENOL) 500 MG tablet Take 1,000 mg by mouth every 6 (six) hours as needed for moderate pain or headache.    Marland Kitchen amLODipine (NORVASC) 2.5 MG tablet Take 1 tablet (2.5 mg total) by mouth daily. 90 tablet 3  . carvedilol (COREG) 12.5 MG tablet TAKE ONE TABLET TWICE DAILY WITH A MEAL 180 tablet 3  . Cholecalciferol (VITAMIN D3) 2000 units capsule Take 2,000 Units  by mouth daily.    . enalapril (VASOTEC) 10 MG tablet Take 1 tablet (10 mg total) by mouth daily. 90 tablet 1  . escitalopram (LEXAPRO) 20 MG tablet Take 1 tablet (20 mg total) by mouth daily. 90 tablet 3  . metroNIDAZOLE (METROCREAM) 0.75 % cream Apply 1 application topically once a week.     . Multiple Vitamin (MULTIVITAMIN) capsule Take 1 capsule by mouth daily.      . pantoprazole (PROTONIX) 40 MG tablet Take 1 tablet (40 mg total) by mouth 2 (two) times daily. 180 tablet 3  . promethazine (PHENERGAN) 12.5 MG tablet TAKE ONE TABLET BY MOUTH EVERY 8 HOURS AS NEEDED FOR NAUSEA/VOMITING 20 tablet 5  .  simvastatin (ZOCOR) 20 MG tablet Take 1 tablet (20 mg total) by mouth at bedtime. 90 tablet 3  . zolpidem (AMBIEN) 10 MG tablet Take 1 tablet (10 mg total) by mouth at bedtime as needed for sleep. 90 tablet 0  . nitroGLYCERIN (NITROSTAT) 0.4 MG SL tablet Place 1 tablet (0.4 mg total) under the tongue every 5 (five) minutes as needed for chest pain. 30 tablet 3   No current facility-administered medications on file prior to visit.     Social History   Tobacco Use  . Smoking status: Former Smoker    Last attempt to quit: 06/03/1981    Years since quitting: 36.3  . Smokeless tobacco: Never Used  Substance Use Topics  . Alcohol use: Yes    Alcohol/week: 1.8 oz    Types: 3 Glasses of wine per week    Comment: Wine per night  . Drug use: No    Review of Systems  Constitutional: Negative for chills and fever.  HENT: Positive for ear pain and sore throat. Negative for congestion, rhinorrhea, sinus pressure, sinus pain, trouble swallowing and voice change.   Respiratory: Positive for cough. Negative for shortness of breath.   Cardiovascular: Negative for chest pain, palpitations and leg swelling.  Gastrointestinal: Negative for nausea and vomiting.  Neurological: Positive for headaches.      Objective:    BP 124/66 (BP Location: Left Arm, Patient Position: Sitting, Cuff Size: Normal)   Pulse 70   Temp 98.2 F (36.8 C) (Oral)   Resp 16   Ht 5\' 2"  (1.575 m)   Wt 136 lb (61.7 kg)   SpO2 97%   BMI 24.87 kg/m    Physical Exam  Constitutional: She appears well-developed and well-nourished.  HENT:  Head: Normocephalic and atraumatic.  Right Ear: Hearing, tympanic membrane, external ear and ear canal normal. No drainage, swelling or tenderness. No foreign bodies. Tympanic membrane is not erythematous and not bulging. No middle ear effusion. No decreased hearing is noted.  Left Ear: Hearing, tympanic membrane, external ear and ear canal normal. No drainage, swelling or tenderness. No  foreign bodies. Tympanic membrane is not erythematous and not bulging.  No middle ear effusion. No decreased hearing is noted.  Nose: Nose normal. No rhinorrhea. Right sinus exhibits no maxillary sinus tenderness and no frontal sinus tenderness. Left sinus exhibits no maxillary sinus tenderness and no frontal sinus tenderness.  Mouth/Throat: Uvula is midline, oropharynx is clear and moist and mucous membranes are normal. No oropharyngeal exudate, posterior oropharyngeal edema, posterior oropharyngeal erythema or tonsillar abscesses.  Eyes: Conjunctivae are normal.  Cardiovascular: Regular rhythm, normal heart sounds and normal pulses.  Pulmonary/Chest: Effort normal and breath sounds normal. She has no wheezes. She has no rhonchi. She has no rales.  Lymphadenopathy:  Head (right side): No submental, no submandibular, no tonsillar, no preauricular, no posterior auricular and no occipital adenopathy present.       Head (left side): No submental, no submandibular, no tonsillar, no preauricular, no posterior auricular and no occipital adenopathy present.    She has no cervical adenopathy.  Neurological: She is alert.  Skin: Skin is warm and dry.  Psychiatric: She has a normal mood and affect. Her speech is normal and behavior is normal. Thought content normal.  Vitals reviewed.      Assessment & Plan:  1. Cough Patient is well-appearing on exam.  No acute respiratory distress.  She is afebrile, SaO2 is 97%.  Duration of symptoms 3 days.  Patient politely declines a strep test today, which I think is appropriate based on her age, and she does not have any known strep exposure or recent exposure to small children.  We jointly agreed that close vigilance appropriate.  She will try over-the-counter Flonase and continue Tylenol as needed.  She will let me know if no improvement.      I am having Jomarie Longs. Hudon maintain her multivitamin, Vitamin D3, metroNIDAZOLE, acetaminophen, promethazine,  carvedilol, amLODipine, simvastatin, nitroGLYCERIN, zolpidem, escitalopram, pantoprazole, and enalapril.   No orders of the defined types were placed in this encounter.   Return precautions given.   Risks, benefits, and alternatives of the medications and treatment plan prescribed today were discussed, and patient expressed understanding.   Education regarding symptom management and diagnosis given to patient on AVS.  Continue to follow with Carlis Abbott Leticia Penna, NP for routine health maintenance.   [place] and I agreed with plan.   Mable Paris, FNP

## 2017-10-02 ENCOUNTER — Telehealth: Payer: Self-pay

## 2017-10-02 NOTE — Telephone Encounter (Signed)
Called patient and left a voicemail stating that she has been scheduled for a colonoscopy with Dr. Fuller Plan on 12/01/17 at 4 pm.  Also that she would need a previsit which was scheduled on 11/19/17.  Request that she call me back to confirm this date and time.

## 2017-10-02 NOTE — Telephone Encounter (Signed)
-----   Message from Lowell Guitar, [place] sent at 08/25/2017  9:37 AM EDT ----- Regarding: colonoscopy Schedule patient for nurses visit and colon for Sept with Dr. Fuller Plan when schedule becomes available.

## 2017-10-07 ENCOUNTER — Ambulatory Visit: Payer: Self-pay

## 2017-10-07 NOTE — Telephone Encounter (Signed)
Ret'd call to pt.  Reported she has had a croupy cough for 9 days, and questions if she is still contagious.   Denied fever; reported she has not had any fever the entire time.  Reported "yellow" mucus nasal drainage starting within the past 2 days.  Is expectorating yellow phlegm, occasionally. Stated her cough is not worse, but hasn't gotten better after 9 days.  Denied feeling more congested in her chest.  C/o sore throat, and feels this is due to the cough irritating her throat.  Denied shortness or breath or chest tightness. Reported she had ear pain, but this has improved.  Denied any sinus tenderness.  Stated had a headache, but this has also improved.  Advised that the cough may linger 2-3 weeks.  Home care recommendations given.  Advised that if she is coughing up phlegm or sneezing and blowing her nose, the resp. Droplets in the air could potentially transmit the virus to people in close proximity to her.   Offered to sched. an appt. with PCP office. Declined to sched. an appt. today.  Reported she will continue to get a lot of rest, use saline nasal spray, and take Tylenol for pain.  Stated she has been reluctant to take OTC cough or cold medications, due to the medications she is currently taking.  Given Care advice per protocol.  Verb. Understanding.  Stated she will call back if symptoms worsen.  Reason for Disposition . Cold with no complications  Answer Assessment - Initial Assessment Questions 1. ONSET: "When did the nasal discharge start?"      2 days  2. AMOUNT: "How much discharge is there?"      Intermittently blowing her nose 3. COUGH: "Do you have a cough?" If yes, ask: "Describe the color of your sputum" (clear, white, yellow, green)     Continued cough x 9 days ; intermittent coughs up yellow phlegm   4. RESPIRATORY DISTRESS: "Describe your breathing."      Denied shortness of breath  5. FEVER: "Do you have a fever?" If so, ask: "What is your temperature, how was it measured,  and when did it start?"     Denied fever 6. SEVERITY: "Overall, how bad are you feeling right now?" (e.g., doesn't interfere with normal activities, staying home from school/work, staying in bed)      Has not had a lot of energy 7. OTHER SYMPTOMS: "Do you have any other symptoms?" (e.g., sore throat, earache, wheezing, vomiting)     Sore throat and hacking croupy cough x 10 days, nasal congestion x 2  8. PREGNANCY: "Is there any chance you are pregnant?" "When was your last menstrual period?"     N/a  Protocols used: COMMON COLD-A-AH

## 2017-10-23 ENCOUNTER — Ambulatory Visit (INDEPENDENT_AMBULATORY_CARE_PROVIDER_SITE_OTHER): Payer: Medicare HMO | Admitting: Primary Care

## 2017-10-23 ENCOUNTER — Encounter: Payer: Self-pay | Admitting: Primary Care

## 2017-10-23 ENCOUNTER — Ambulatory Visit (INDEPENDENT_AMBULATORY_CARE_PROVIDER_SITE_OTHER): Payer: Medicare HMO | Admitting: *Deleted

## 2017-10-23 ENCOUNTER — Other Ambulatory Visit: Payer: Self-pay | Admitting: Primary Care

## 2017-10-23 VITALS — BP 120/82 | HR 86 | Temp 98.2°F | Ht 62.0 in | Wt 135.0 lb

## 2017-10-23 DIAGNOSIS — G4700 Insomnia, unspecified: Secondary | ICD-10-CM

## 2017-10-23 DIAGNOSIS — E7800 Pure hypercholesterolemia, unspecified: Secondary | ICD-10-CM

## 2017-10-23 DIAGNOSIS — I429 Cardiomyopathy, unspecified: Secondary | ICD-10-CM

## 2017-10-23 DIAGNOSIS — Z0000 Encounter for general adult medical examination without abnormal findings: Secondary | ICD-10-CM

## 2017-10-23 DIAGNOSIS — K2100 Gastro-esophageal reflux disease with esophagitis, without bleeding: Secondary | ICD-10-CM

## 2017-10-23 DIAGNOSIS — R55 Syncope and collapse: Secondary | ICD-10-CM | POA: Diagnosis not present

## 2017-10-23 DIAGNOSIS — R519 Headache, unspecified: Secondary | ICD-10-CM

## 2017-10-23 DIAGNOSIS — F3341 Major depressive disorder, recurrent, in partial remission: Secondary | ICD-10-CM | POA: Diagnosis not present

## 2017-10-23 DIAGNOSIS — K208 Other esophagitis without bleeding: Secondary | ICD-10-CM

## 2017-10-23 DIAGNOSIS — Z1159 Encounter for screening for other viral diseases: Secondary | ICD-10-CM

## 2017-10-23 DIAGNOSIS — R51 Headache: Secondary | ICD-10-CM

## 2017-10-23 LAB — COMPREHENSIVE METABOLIC PANEL
ALK PHOS: 70 U/L (ref 39–117)
ALT: 16 U/L (ref 0–35)
AST: 19 U/L (ref 0–37)
Albumin: 4.2 g/dL (ref 3.5–5.2)
BUN: 14 mg/dL (ref 6–23)
CO2: 32 meq/L (ref 19–32)
Calcium: 9.7 mg/dL (ref 8.4–10.5)
Chloride: 103 mEq/L (ref 96–112)
Creatinine, Ser: 0.7 mg/dL (ref 0.40–1.20)
GFR: 87.13 mL/min (ref >60.00)
GLUCOSE: 95 mg/dL (ref 70–99)
POTASSIUM: 4.3 meq/L (ref 3.5–5.1)
Sodium: 139 mEq/L (ref 135–145)
TOTAL PROTEIN: 6.9 g/dL (ref 6.0–8.3)
Total Bilirubin: 0.5 mg/dL (ref 0.2–1.2)

## 2017-10-23 LAB — LDL CHOLESTEROL, DIRECT: Direct LDL: 66 mg/dL

## 2017-10-23 LAB — LIPID PANEL
Cholesterol: 193 mg/dL (ref 0–200)
HDL: 48.4 mg/dL (ref >39.00)
Total CHOL/HDL Ratio: 4
Triglycerides: 448 mg/dL — ABNORMAL HIGH (ref 0.0–149.0)

## 2017-10-23 MED ORDER — ZOLPIDEM TARTRATE 10 MG PO TABS: 10.0000 mg | ORAL_TABLET | Freq: Every evening | ORAL | 0 refills | Status: DC | PRN

## 2017-10-23 NOTE — Assessment & Plan Note (Signed)
Immunizations UTD per patient. Mammogram and Bone Density UTD. Compliant to vitamin D. Recommended regular exercise. Diet overall healthy, limit sweets. Exam unremarkable. Labs pending. Follow up in 1 year for CPE.

## 2017-10-23 NOTE — Assessment & Plan Note (Signed)
Repeat lipids pending. Continue simvastatin.

## 2017-10-23 NOTE — Assessment & Plan Note (Signed)
Compliant to pantoprazole, following with GI.

## 2017-10-23 NOTE — Assessment & Plan Note (Signed)
Doing well on Lexapro 20 mg, continue same. Denies SI/HI.

## 2017-10-23 NOTE — Patient Instructions (Signed)
Stop by the lab prior to leaving today. I will notify you of your results once received.   Start exercising. You should be getting 150 minutes of exercise weekly.  Continue vitamin D.   Make sure to eat plenty of vegetables, fruit, whole grains, lean protein.  Ensure you are consuming 64 ounces of water daily.  Follow up in 1 year for your annual exam or sooner if needed.  It was a pleasure to see you today!

## 2017-10-23 NOTE — Assessment & Plan Note (Signed)
Intermittent, using Phenergan PRN if needed for nausea.

## 2017-10-23 NOTE — Progress Notes (Signed)
Subjective:    Patient ID: [name], female    DOB: [birth year], 72 y.o.   MRN: [id number]  HPI  [name] is a 72 year old female who presents today for complete physical.  Immunizations: -Influenza: Completed last season, due again this season.  -Pneumonia: Completed Prevnar in 2017, pneumonia  -Shingles: Completed Shingrix in 2018, both doses  Diet: She endorses a fair diet Breakfast: Egg, bacon, fruit, yogurt Lunch: Restaurant (salad, chicken) Dinner: Meat, vegetables, starch Snacks: Occasionally Desserts: 3-4 times weekly  Beverages: Water, Propel, wine   Exercise: She is not exercising Eye exam: Completes annually  Dental exam: Completes annually  Colonoscopy: Completed in 2013, due. Declines colonoscopy. Opts for Cologuard. Dexa: Completed in July 2019, osteopenia Mammogram: Completed in 2019 Hep C Screen: Due today   Review of Systems  Constitutional: Negative for unexpected weight change.  HENT: Negative for rhinorrhea.   Respiratory: Negative for cough and shortness of breath.   Cardiovascular: Negative for chest pain.  Gastrointestinal: Negative for constipation and diarrhea.  Genitourinary: Negative for difficulty urinating and menstrual problem.  Musculoskeletal: Negative for arthralgias and myalgias.  Skin: Negative for rash.  Allergic/Immunologic: Negative for environmental allergies.  Neurological: Negative for dizziness, numbness and headaches.  Psychiatric/Behavioral:       Feels well managed on Lexapro       Past Medical History:  Diagnosis Date  . Cardiomyopathy    nonischemic  . Chronic back pain   . Complication of anesthesia   . Erosive esophagitis    LA Classification Grade B  . Essential hypertension   . GERD (gastroesophageal reflux disease)   . Hiatal hernia   . Hyperlipidemia   . Insomnia   . MDD (major depressive disorder)   . Melanoma ([place])    basil cell, pt reports no melanoma  . Migraine   . Osteopenia   . PONV  (postoperative nausea and vomiting)   . Rosacea   . Sessile colonic polyp 10/2011   adenoma  . Squamous carcinoma    scalp     Social History   Socioeconomic History  . Marital status: Widowed    Spouse name: Not on file  . Number of children: 3  . Years of education: 33  . Highest education level: Not on file  Occupational History  . Not on file  Social Needs  . Financial resource strain: Not on file  . Food insecurity:    Worry: Not on file    Inability: Not on file  . Transportation needs:    Medical: Not on file    Non-medical: Not on file  Tobacco Use  . Smoking status: Former Smoker    Last attempt to quit: 06/03/1981    Years since quitting: 36.4  . Smokeless tobacco: Never Used  Substance and Sexual Activity  . Alcohol use: Yes    Alcohol/week: 3.0 standard drinks    Types: 3 Glasses of wine per week    Comment: Wine per night  . Drug use: No  . Sexual activity: Not on file  Lifestyle  . Physical activity:    Days per week: Not on file    Minutes per session: Not on file  . Stress: Not on file  Relationships  . Social connections:    Talks on phone: Not on file    Gets together: Not on file    Attends religious service: Not on file    Active member of club or organization: Not on file  Attends meetings of clubs or organizations: Not on file    Relationship status: Not on file  . Intimate partner violence:    Fear of current or ex partner: Not on file    Emotionally abused: Not on file    Physically abused: Not on file    Forced sexual activity: Not on file  Other Topics Concern  . Not on file  Social History Narrative   Lives alone in two-story home.     Her husband passed away unexpectedly.     They have three children- one is a pediatrician.   Desires CPR.   Caffeine use: Decaf coffee   Right handed    Past Surgical History:  Procedure Laterality Date  . APPENDECTOMY    . CARDIAC CATHETERIZATION    . CATARACT EXTRACTION W/PHACO Right  03/18/2016   Procedure: CATARACT EXTRACTION PHACO AND INTRAOCULAR LENS PLACEMENT (IOC);  Surgeon: Birder [name], MD;  Location: [facility];  Service: Ophthalmology;  Laterality: Right;  [place] 45.3 AP% 22.2 CDE 10.07 Fluid pack lot # [id number]  . CATARACT EXTRACTION W/PHACO Left 04/08/2016   Procedure: CATARACT EXTRACTION PHACO AND INTRAOCULAR LENS PLACEMENT (IOC);  Surgeon: Birder [name], MD;  Location: [facility];  Service: Ophthalmology;  Laterality: Left;  [place] 00:41 AP% 15.0 CDE 6.27 Fluid pack lot # [id number]  . LOOP RECORDER INSERTION N/A 10/10/2016   Procedure: Loop Recorder Insertion;  Surgeon: Evans Lance, MD;  Location: [facility];  Service: Cardiovascular;  Laterality: N/A;  . SKIN CANCER EXCISION     scalp  . TUBAL LIGATION    . VAGINAL HYSTERECTOMY      Family History  Problem Relation Age of Onset  . Alzheimer's disease Mother   . Heart disease Father   . Arthritis/Rheumatoid Sister   . Colon cancer Neg Hx   . Stomach cancer Neg Hx   . Rectal cancer Neg Hx   . Esophageal cancer Neg Hx     Allergies  Allergen Reactions  . Penicillin G Swelling    Rash,  . Penicillins Anaphylaxis and Hives    Has patient had a PCN reaction causing immediate rash, facial/tongue/throat swelling, SOB or lightheadedness with hypotension: {Yes Has patient had a PCN reaction causing severe rash involving mucus membranes or skin necrosis: {no Has patient had a PCN reaction that required hospitalization {yes Has patient had a PCN reaction occurring within the last 10 years: {no If all of the above answers are "NO", then may proceed with Cephalosporin use.  . Clindamycin Itching    rash  . Lansoprazole Other (See Comments)    REACTION: headaches  . Lincomycin Itching    rash  . Sulfa Antibiotics Hives  . Sulfasalazine Hives  . Cefaclor Itching and Rash    rash  . Cefuroxime Axetil Rash and Itching    rash  . Clindamycin/Lincomycin Itching and Rash  . Latex Itching and Rash     rash  . Lincomycin Hcl Itching and Rash  . Sulfamethoxazole-Trimethoprim Rash    Current Outpatient Medications on File Prior to Visit  Medication Sig Dispense Refill  . acetaminophen (TYLENOL) 500 MG tablet Take 1,000 mg by mouth every 6 (six) hours as needed for moderate pain or headache.    Marland Kitchen amLODipine (NORVASC) 2.5 MG tablet Take 1 tablet (2.5 mg total) by mouth daily. 90 tablet 3  . carvedilol (COREG) 12.5 MG tablet TAKE ONE TABLET TWICE DAILY WITH A MEAL 180 tablet 3  . Cholecalciferol (VITAMIN D3) 2000 units  capsule Take 2,000 Units by mouth daily.    . enalapril (VASOTEC) 10 MG tablet Take 1 tablet (10 mg total) by mouth daily. 90 tablet 1  . escitalopram (LEXAPRO) 20 MG tablet Take 1 tablet (20 mg total) by mouth daily. 90 tablet 3  . metroNIDAZOLE (METROCREAM) 0.75 % cream Apply 1 application topically once a week.     . Multiple Vitamin (MULTIVITAMIN) capsule Take 1 capsule by mouth daily.      . pantoprazole (PROTONIX) 40 MG tablet Take 1 tablet (40 mg total) by mouth 2 (two) times daily. 180 tablet 3  . promethazine (PHENERGAN) 12.5 MG tablet TAKE ONE TABLET BY MOUTH EVERY 8 HOURS AS NEEDED FOR NAUSEA/VOMITING 20 tablet 5  . simvastatin (ZOCOR) 20 MG tablet Take 1 tablet (20 mg total) by mouth at bedtime. 90 tablet 3  . nitroGLYCERIN (NITROSTAT) 0.4 MG SL tablet Place 1 tablet (0.4 mg total) under the tongue every 5 (five) minutes as needed for chest pain. 30 tablet 3   No current facility-administered medications on file prior to visit.     BP 120/82   Pulse 86   Temp 98.2 F (36.8 C) (Oral)   Ht 5\' 2"  (1.575 m)   Wt 135 lb (61.2 kg)   SpO2 98%   BMI 24.69 kg/m    Objective:   Physical Exam  Constitutional: She is oriented to person, place, and time. She appears well-nourished.  HENT:  Mouth/Throat: No oropharyngeal exudate.  Eyes: Pupils are equal, round, and reactive to light. EOM are normal.  Neck: Neck supple. No thyromegaly present.  Cardiovascular: Normal  rate and regular rhythm.  Respiratory: Effort normal and breath sounds normal.  GI: Soft. Bowel sounds are normal. There is no tenderness.  Musculoskeletal: Normal range of motion.  Neurological: She is alert and oriented to person, place, and time.  Skin: Skin is warm and dry.  Psychiatric: She has a normal mood and affect.           Assessment & Plan:

## 2017-10-23 NOTE — Assessment & Plan Note (Signed)
Doing well since implantation of LOOP recorder. Continue current regimen. Following with cardiology.

## 2017-10-23 NOTE — Assessment & Plan Note (Signed)
Doing well on pantoprazole, continue same. 

## 2017-10-23 NOTE — Assessment & Plan Note (Signed)
Doing well on Ambien 10 mg for which takes nightly. Doing well, denies SI/HI.

## 2017-10-23 NOTE — Assessment & Plan Note (Signed)
Following with cardiology. Continue ACE, beta blocker, Amlodipine.

## 2017-10-24 LAB — HEPATITIS C ANTIBODY
Hepatitis C Ab: NONREACTIVE
SIGNAL TO CUT-OFF: 0.01 (ref <1.00)

## 2017-10-26 NOTE — Progress Notes (Signed)
Carelink Summary Report / Loop Recorder 

## 2017-10-28 ENCOUNTER — Other Ambulatory Visit: Payer: Self-pay | Admitting: Primary Care

## 2017-10-28 DIAGNOSIS — E7800 Pure hypercholesterolemia, unspecified: Secondary | ICD-10-CM

## 2017-10-28 MED ORDER — ROSUVASTATIN CALCIUM 20 MG PO TABS: ORAL_TABLET | ORAL | 1 refills | Status: DC

## 2017-11-03 LAB — CUP PACEART REMOTE DEVICE CHECK
MDC IDC PG IMPLANT DT: 20180803
MDC IDC SESS DTM: 20190714224035

## 2017-11-11 DIAGNOSIS — Z1212 Encounter for screening for malignant neoplasm of rectum: Secondary | ICD-10-CM | POA: Diagnosis not present

## 2017-11-11 DIAGNOSIS — Z1211 Encounter for screening for malignant neoplasm of colon: Secondary | ICD-10-CM | POA: Diagnosis not present

## 2017-11-11 LAB — COLOGUARD: COLOGUARD: POSITIVE

## 2017-11-25 ENCOUNTER — Ambulatory Visit (INDEPENDENT_AMBULATORY_CARE_PROVIDER_SITE_OTHER): Payer: Medicare HMO | Admitting: *Deleted

## 2017-11-25 ENCOUNTER — Telehealth: Payer: Self-pay | Admitting: Primary Care

## 2017-11-25 DIAGNOSIS — L570 Actinic keratosis: Secondary | ICD-10-CM | POA: Diagnosis not present

## 2017-11-25 DIAGNOSIS — X32XXXA Exposure to sunlight, initial encounter: Secondary | ICD-10-CM | POA: Diagnosis not present

## 2017-11-25 DIAGNOSIS — L821 Other seborrheic keratosis: Secondary | ICD-10-CM | POA: Diagnosis not present

## 2017-11-25 DIAGNOSIS — D2262 Melanocytic nevi of left upper limb, including shoulder: Secondary | ICD-10-CM | POA: Diagnosis not present

## 2017-11-25 DIAGNOSIS — C44319 Basal cell carcinoma of skin of other parts of face: Secondary | ICD-10-CM | POA: Diagnosis not present

## 2017-11-25 DIAGNOSIS — R55 Syncope and collapse: Secondary | ICD-10-CM

## 2017-11-25 DIAGNOSIS — D2261 Melanocytic nevi of right upper limb, including shoulder: Secondary | ICD-10-CM | POA: Diagnosis not present

## 2017-11-25 DIAGNOSIS — Z1211 Encounter for screening for malignant neoplasm of colon: Secondary | ICD-10-CM

## 2017-11-25 DIAGNOSIS — D2271 Melanocytic nevi of right lower limb, including hip: Secondary | ICD-10-CM | POA: Diagnosis not present

## 2017-11-25 DIAGNOSIS — D2272 Melanocytic nevi of left lower limb, including hip: Secondary | ICD-10-CM | POA: Diagnosis not present

## 2017-11-25 DIAGNOSIS — D485 Neoplasm of uncertain behavior of skin: Secondary | ICD-10-CM | POA: Diagnosis not present

## 2017-11-25 DIAGNOSIS — D225 Melanocytic nevi of trunk: Secondary | ICD-10-CM | POA: Diagnosis not present

## 2017-11-25 NOTE — Telephone Encounter (Signed)
Please notify patient that her Cologuard specimen is positive. This means that she needs to undergo colonoscopy for further evaluation. Please notify me if she's agreeable. I recommend it.

## 2017-11-26 NOTE — Progress Notes (Signed)
Carelink Summary Report / Loop Recorder 

## 2017-11-27 NOTE — Telephone Encounter (Signed)
Noted, referral placed.  

## 2017-11-27 NOTE — Telephone Encounter (Signed)
Patient is agreeable to the colonoscopy. Patient stated that she see Dr Lucio Edward and she will call their office. She stated that can Anda Kraft put the referral in as well just in case they needed it

## 2017-11-30 LAB — CUP PACEART REMOTE DEVICE CHECK
Date Time Interrogation Session: 20190816223700
MDC IDC PG IMPLANT DT: 20180803

## 2017-12-01 ENCOUNTER — Encounter: Payer: Medicare HMO | Admitting: Gastroenterology

## 2017-12-07 LAB — CUP PACEART REMOTE DEVICE CHECK
Implantable Pulse Generator Implant Date: 20180803
MDC IDC SESS DTM: 20190918233618

## 2017-12-09 DIAGNOSIS — H26492 Other secondary cataract, left eye: Secondary | ICD-10-CM | POA: Diagnosis not present

## 2017-12-09 DIAGNOSIS — H16222 Keratoconjunctivitis sicca, not specified as Sjogren's, left eye: Secondary | ICD-10-CM | POA: Diagnosis not present

## 2017-12-09 DIAGNOSIS — H16223 Keratoconjunctivitis sicca, not specified as Sjogren's, bilateral: Secondary | ICD-10-CM | POA: Diagnosis not present

## 2017-12-09 DIAGNOSIS — H16221 Keratoconjunctivitis sicca, not specified as Sjogren's, right eye: Secondary | ICD-10-CM | POA: Diagnosis not present

## 2017-12-10 ENCOUNTER — Encounter (INDEPENDENT_AMBULATORY_CARE_PROVIDER_SITE_OTHER): Payer: Self-pay

## 2017-12-10 ENCOUNTER — Other Ambulatory Visit (INDEPENDENT_AMBULATORY_CARE_PROVIDER_SITE_OTHER): Payer: Medicare HMO

## 2017-12-10 DIAGNOSIS — E7800 Pure hypercholesterolemia, unspecified: Secondary | ICD-10-CM | POA: Diagnosis not present

## 2017-12-10 LAB — HEPATIC FUNCTION PANEL
ALT: 15 U/L (ref 0–35)
AST: 18 U/L (ref 0–37)
Albumin: 4.2 g/dL (ref 3.5–5.2)
Alkaline Phosphatase: 58 U/L (ref 39–117)
Bilirubin, Direct: 0.1 mg/dL (ref 0.0–0.3)
TOTAL PROTEIN: 6.9 g/dL (ref 6.0–8.3)
Total Bilirubin: 0.5 mg/dL (ref 0.2–1.2)

## 2017-12-10 LAB — LIPID PANEL
CHOLESTEROL: 106 mg/dL (ref 0–200)
HDL: 45.1 mg/dL (ref >39.00)
LDL Cholesterol: 28 mg/dL (ref 0–99)
NonHDL: 61.17
TRIGLYCERIDES: 166 mg/dL — AB (ref 0.0–149.0)
Total CHOL/HDL Ratio: 2
VLDL: 33.2 mg/dL (ref 0.0–40.0)

## 2017-12-21 ENCOUNTER — Telehealth: Payer: Self-pay | Admitting: Primary Care

## 2017-12-21 NOTE — Telephone Encounter (Signed)
Copied from [place] [id number]. Topic: Quick Communication - See Telephone Encounter >> Dec 21, 2017  1:13 PM Vernona Rieger wrote: CRM for notification. See Telephone encounter for: 12/21/17.  [name] with [facility] sciences called to see if the office received her abnormal colo guard results. She said to only call back if you did not receive them. Call back @ [phone] and hit provider services.

## 2017-12-21 NOTE — Telephone Encounter (Signed)
Noted. This has already been addressed.

## 2017-12-23 DIAGNOSIS — Z961 Presence of intraocular lens: Secondary | ICD-10-CM | POA: Diagnosis not present

## 2017-12-23 DIAGNOSIS — H16223 Keratoconjunctivitis sicca, not specified as Sjogren's, bilateral: Secondary | ICD-10-CM | POA: Diagnosis not present

## 2017-12-23 DIAGNOSIS — H04123 Dry eye syndrome of bilateral lacrimal glands: Secondary | ICD-10-CM | POA: Diagnosis not present

## 2017-12-25 DIAGNOSIS — C44319 Basal cell carcinoma of skin of other parts of face: Secondary | ICD-10-CM | POA: Diagnosis not present

## 2017-12-28 ENCOUNTER — Ambulatory Visit (INDEPENDENT_AMBULATORY_CARE_PROVIDER_SITE_OTHER): Payer: Medicare HMO | Admitting: *Deleted

## 2017-12-28 DIAGNOSIS — R55 Syncope and collapse: Secondary | ICD-10-CM | POA: Diagnosis not present

## 2017-12-29 NOTE — Progress Notes (Signed)
Carelink Summary Report / Loop Recorder 

## 2018-01-04 ENCOUNTER — Other Ambulatory Visit: Payer: Self-pay | Admitting: Primary Care

## 2018-01-04 DIAGNOSIS — R519 Headache, unspecified: Secondary | ICD-10-CM

## 2018-01-04 DIAGNOSIS — R51 Headache: Principal | ICD-10-CM

## 2018-01-04 NOTE — Telephone Encounter (Signed)
Noted, she uses this for migraines. Refill sent to pharmacy.

## 2018-01-04 NOTE — Telephone Encounter (Signed)
Last prescribed by Dr Deborra Medina on  11/25/2016 Last office visit on 10/23/2017

## 2018-01-11 ENCOUNTER — Encounter: Payer: Self-pay | Admitting: *Deleted

## 2018-01-15 LAB — CUP PACEART REMOTE DEVICE CHECK
Date Time Interrogation Session: 20191021233940
MDC IDC PG IMPLANT DT: 20180803

## 2018-01-19 ENCOUNTER — Other Ambulatory Visit: Payer: Self-pay | Admitting: Primary Care

## 2018-01-19 DIAGNOSIS — G4700 Insomnia, unspecified: Secondary | ICD-10-CM

## 2018-01-19 NOTE — Telephone Encounter (Signed)
Name of Medication: zolpidem (AMBIEN) 10 MG tablet  Name of Pharmacy: [facility] or Written Date and Quantity: 10/23/2017 #90  Last Office Visit and Type: 10/23/2017 CPE  Next Office Visit and Type:   Last Controlled Substance Agreement Date: 12/22/2016  Last UDS: 12/22/2016

## 2018-01-19 NOTE — Telephone Encounter (Signed)
Noted, refill sent to pharmacy. 

## 2018-01-20 ENCOUNTER — Telehealth: Payer: Self-pay | Admitting: Primary Care

## 2018-01-20 DIAGNOSIS — R768 Other specified abnormal immunological findings in serum: Secondary | ICD-10-CM

## 2018-01-20 DIAGNOSIS — H16223 Keratoconjunctivitis sicca, not specified as Sjogren's, bilateral: Secondary | ICD-10-CM | POA: Diagnosis not present

## 2018-01-20 DIAGNOSIS — H04123 Dry eye syndrome of bilateral lacrimal glands: Secondary | ICD-10-CM | POA: Diagnosis not present

## 2018-01-20 NOTE — Telephone Encounter (Signed)
Labs reviewed, I need some additional information. Why were these labs drawn by the eye doctor?  What symptoms is she experiencing?  What did the eye doctor say in regards to labs?

## 2018-01-20 NOTE — Telephone Encounter (Signed)
Pt dropped off lab results from eye doctor. States labs were high and is concerned about RA. Requesting a referral to rheumatology. Placed results on cart

## 2018-01-21 ENCOUNTER — Other Ambulatory Visit: Payer: Self-pay | Admitting: Primary Care

## 2018-01-21 DIAGNOSIS — E7800 Pure hypercholesterolemia, unspecified: Secondary | ICD-10-CM

## 2018-01-22 NOTE — Telephone Encounter (Signed)
Please notify patient that I would like to do a confirmatory lab test for rheumatoid arthritis.  Please schedule a lab only appointment at her convenience.

## 2018-01-22 NOTE — Telephone Encounter (Signed)
Spoken and notified patient of Tawni Millers comments. Patient stated that since she had catalysts remove over a year ago and still having problems so that's why the labs were drawn. She is experiencing symptoms regarding her eye such as can't see clear, some pain, and dry eyes. She does not noticed symptoms regarding RA. She was told by the eye doctor to let her primary provider review the labs. Patient stated that she will do what Anda Kraft suggest regarding the labs.

## 2018-01-26 ENCOUNTER — Other Ambulatory Visit (INDEPENDENT_AMBULATORY_CARE_PROVIDER_SITE_OTHER): Payer: Medicare HMO

## 2018-01-26 DIAGNOSIS — R768 Other specified abnormal immunological findings in serum: Secondary | ICD-10-CM

## 2018-01-26 NOTE — Telephone Encounter (Signed)
Spoken and notified patient of [name] comments. Patient verbalized understanding.  Lab appt 01/26/2018

## 2018-01-27 LAB — CYCLIC CITRUL PEPTIDE ANTIBODY, IGG

## 2018-02-01 ENCOUNTER — Ambulatory Visit (INDEPENDENT_AMBULATORY_CARE_PROVIDER_SITE_OTHER): Payer: Medicare HMO | Admitting: Family Medicine

## 2018-02-01 ENCOUNTER — Ambulatory Visit (INDEPENDENT_AMBULATORY_CARE_PROVIDER_SITE_OTHER): Payer: Medicare HMO

## 2018-02-01 ENCOUNTER — Encounter: Payer: Self-pay | Admitting: Family Medicine

## 2018-02-01 ENCOUNTER — Telehealth: Payer: Self-pay

## 2018-02-01 VITALS — BP 122/64 | HR 63 | Temp 98.2°F | Ht 62.0 in | Wt 134.0 lb

## 2018-02-01 DIAGNOSIS — I429 Cardiomyopathy, unspecified: Secondary | ICD-10-CM

## 2018-02-01 DIAGNOSIS — S134XXA Sprain of ligaments of cervical spine, initial encounter: Secondary | ICD-10-CM | POA: Diagnosis not present

## 2018-02-01 DIAGNOSIS — R55 Syncope and collapse: Secondary | ICD-10-CM

## 2018-02-01 DIAGNOSIS — R768 Other specified abnormal immunological findings in serum: Secondary | ICD-10-CM

## 2018-02-01 DIAGNOSIS — W19XXXA Unspecified fall, initial encounter: Secondary | ICD-10-CM | POA: Diagnosis not present

## 2018-02-01 DIAGNOSIS — M25552 Pain in left hip: Secondary | ICD-10-CM

## 2018-02-01 NOTE — Telephone Encounter (Signed)
Message left for patient to return my call.  
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
## 2018-02-01 NOTE — Progress Notes (Signed)
Carelink Summary Report / Loop Recorder 

## 2018-02-01 NOTE — Telephone Encounter (Signed)
The rheumatoid factor test that was drawn by her eye doctor was "positive" but her other inflammatory labs were negative. Her "positive" test could have been a false positive. We did a better test called CCP which tests for rheumatoid arthritis and it was negative. I recommend we repeat both tests in one month to ensure a negative diagnosis.   Again, please ask her if she's had any joint swelling, joint redness, joint pain to the hands or elsewhere on her body. With a "positive" lab test from her eye doctor and the lack of those symptoms, and a negative CCP, a diagnosis of RA is unlikely.   Please schedule a lab only appointment in 1 month. Have her notify me sooner if she does develop any of those symptoms.

## 2018-02-01 NOTE — Telephone Encounter (Signed)
Patient had a question about her recent labs. She wanted to know why the test we ordered showed she had no RA but the labs from the other doctor that she dropped off for Alma Friendly to review (from ophthalmologist?) showed positive test. Why there were 2 different results. Did not see labs from another provider scanned in the chart yet. Please review. Thank you. CB: [phone]

## 2018-02-01 NOTE — Patient Instructions (Signed)
1. Neck injury -- if not improving or resolved within 1-2 weeks -- call back, would recommend physical therapy 2. Leg injury - continue to rest and ice/heat -- if not improved in 1-3 weeks return for re-evaluation  Cervical Strain and Sprain Rehab Ask your health care provider which exercises are safe for you. Do exercises exactly as told by your health care provider and adjust them as directed. It is normal to feel mild stretching, pulling, tightness, or discomfort as you do these exercises, but you should stop right away if you feel sudden pain or your pain gets worse.Do not begin these exercises until told by your health care provider. Stretching and range of motion exercises These exercises warm up your muscles and joints and improve the movement and flexibility of your neck. These exercises also help to relieve pain, numbness, and tingling. Exercise A: Cervical side bend  1. Using good posture, sit on a stable chair or stand up. 2. Without moving your shoulders, slowly tilt your left / right ear to your shoulder until you feel a stretch in your neck muscles. You should be looking straight ahead. 3. Hold for __________ seconds. 4. Repeat with the other side of your neck. Repeat __________ times. Complete this exercise __________ times a day. Exercise B: Cervical rotation  1. Using good posture, sit on a stable chair or stand up. 2. Slowly turn your head to the side as if you are looking over your left / right shoulder. ? Keep your eyes level with the ground. ? Stop when you feel a stretch along the side and the back of your neck. 3. Hold for __________ seconds. 4. Repeat this by turning to your other side. Repeat __________ times. Complete this exercise __________ times a day. Exercise C: Thoracic extension and pectoral stretch 1. Roll a towel or a small blanket so it is about 4 inches (10 cm) in diameter. 2. Lie down on your back on a firm surface. 3. Put the towel lengthwise, under  your spine in the middle of your back. It should not be not under your shoulder blades. The towel should line up with your spine from your middle back to your lower back. 4. Put your hands behind your head and let your elbows fall out to your sides. 5. Hold for __________ seconds. Repeat __________ times. Complete this exercise __________ times a day. Strengthening exercises These exercises build strength and endurance in your neck. Endurance is the ability to use your muscles for a long time, even after your muscles get tired. Exercise D: Upper cervical flexion, isometric 1. Lie on your back with a thin pillow behind your head and a small rolled-up towel under your neck. 2. Gently tuck your chin toward your chest and nod your head down to look toward your feet. Do not lift your head off the pillow. 3. Hold for __________ seconds. 4. Release the tension slowly. Relax your neck muscles completely before you repeat this exercise. Repeat __________ times. Complete this exercise __________ times a day. Exercise E: Cervical extension, isometric  1. Stand about 6 inches (15 cm) away from a wall, with your back facing the wall. 2. Place a soft object, about 6-8 inches (15-20 cm) in diameter, between the back of your head and the wall. A soft object could be a small pillow, a ball, or a folded towel. 3. Gently tilt your head back and press into the soft object. Keep your jaw and forehead relaxed. 4. Hold for __________ seconds.  5. Release the tension slowly. Relax your neck muscles completely before you repeat this exercise. Repeat __________ times. Complete this exercise __________ times a day. Posture and body mechanics  Body mechanics refers to the movements and positions of your body while you do your daily activities. Posture is part of body mechanics. Good posture and healthy body mechanics can help to relieve stress in your body's tissues and joints. Good posture means that your spine is in its  natural S-curve position (your spine is neutral), your shoulders are pulled back slightly, and your head is not tipped forward. The following are general guidelines for applying improved posture and body mechanics to your everyday activities. Standing  When standing, keep your spine neutral and keep your feet about hip-width apart. Keep a slight bend in your knees. Your ears, shoulders, and hips should line up.  When you do a task in which you stand in one place for a long time, place one foot up on a stable object that is 2-4 inches (5-10 cm) high, such as a footstool. This helps keep your spine neutral. Sitting   When sitting, keep your spine neutral and your keep feet flat on the floor. Use a footrest, if necessary, and keep your thighs parallel to the floor. Avoid rounding your shoulders, and avoid tilting your head forward.  When working at a desk or a computer, keep your desk at a height where your hands are slightly lower than your elbows. Slide your chair under your desk so you are close enough to maintain good posture.  When working at a computer, place your monitor at a height where you are looking straight ahead and you do not have to tilt your head forward or downward to look at the screen. Resting When lying down and resting, avoid positions that are most painful for you. Try to support your neck in a neutral position. You can use a contour pillow or a small rolled-up towel. Your pillow should support your neck but not push on it. This information is not intended to replace advice given to you by your health care provider. Make sure you discuss any questions you have with your health care provider. Document Released: 02/24/2005 Document Revised: 11/01/2015 Document Reviewed: 01/31/2015 Elsevier Interactive Patient Education  [employer].

## 2018-02-02 ENCOUNTER — Encounter: Payer: Self-pay | Admitting: *Deleted

## 2018-02-02 NOTE — Telephone Encounter (Signed)
Spoken and notified patient of [name] comments. Patient verbalized understanding.  Lab appointment has been schedule on 02/23/2018

## 2018-02-02 NOTE — Telephone Encounter (Signed)
Pt returning your call

## 2018-02-22 DIAGNOSIS — H16223 Keratoconjunctivitis sicca, not specified as Sjogren's, bilateral: Secondary | ICD-10-CM | POA: Diagnosis not present

## 2018-02-22 DIAGNOSIS — Z961 Presence of intraocular lens: Secondary | ICD-10-CM | POA: Diagnosis not present

## 2018-02-23 ENCOUNTER — Other Ambulatory Visit (INDEPENDENT_AMBULATORY_CARE_PROVIDER_SITE_OTHER): Payer: Medicare HMO

## 2018-02-23 DIAGNOSIS — R768 Other specified abnormal immunological findings in serum: Secondary | ICD-10-CM | POA: Diagnosis not present

## 2018-02-23 LAB — C-REACTIVE PROTEIN: CRP: 0.3 mg/dL — ABNORMAL LOW (ref 0.5–20.0)

## 2018-02-23 LAB — SEDIMENTATION RATE: SED RATE: 14 mm/h (ref 0–30)

## 2018-02-24 LAB — RHEUMATOID FACTOR: Rheumatoid fact SerPl-aCnc: 30 IU/mL — ABNORMAL HIGH (ref <14)

## 2018-02-24 LAB — CYCLIC CITRUL PEPTIDE ANTIBODY, IGG: Cyclic Citrullin Peptide Ab: <16 UNITS

## 2018-02-25 ENCOUNTER — Telehealth: Payer: Self-pay | Admitting: *Deleted

## 2018-02-25 NOTE — Telephone Encounter (Signed)
-----   Message from [name], NP sent at 02/24/2018 12:45 PM EST ----- Please notify patient:  Most of her labs for rheumatoid arthritis appear negative. She does have one positive lab. This could be a false positive, especially since her other labs are negative. If she's having joint swelling, joint pain then I think it's reasonable to send her to rheumatology. Let me know.

## 2018-02-25 NOTE — Telephone Encounter (Signed)
Message left for patient to return my call.  

## 2018-02-26 NOTE — Telephone Encounter (Signed)
This has been addressed in results note.

## 2018-03-04 ENCOUNTER — Ambulatory Visit: Payer: Medicare HMO

## 2018-03-05 LAB — CUP PACEART REMOTE DEVICE CHECK
Date Time Interrogation Session: 20191227013954
Implantable Pulse Generator Implant Date: 20180803

## 2018-03-15 DIAGNOSIS — H16223 Keratoconjunctivitis sicca, not specified as Sjogren's, bilateral: Secondary | ICD-10-CM | POA: Diagnosis not present

## 2018-03-15 DIAGNOSIS — H04123 Dry eye syndrome of bilateral lacrimal glands: Secondary | ICD-10-CM | POA: Diagnosis not present

## 2018-03-15 DIAGNOSIS — H18413 Arcus senilis, bilateral: Secondary | ICD-10-CM | POA: Diagnosis not present

## 2018-03-15 DIAGNOSIS — Z961 Presence of intraocular lens: Secondary | ICD-10-CM | POA: Diagnosis not present

## 2018-03-16 ENCOUNTER — Other Ambulatory Visit: Payer: Self-pay

## 2018-03-16 MED ORDER — CARVEDILOL 12.5 MG PO TABS: ORAL_TABLET | ORAL | 0 refills | Status: DC

## 2018-03-16 MED ORDER — ENALAPRIL MALEATE 10 MG PO TABS: 10.0000 mg | ORAL_TABLET | Freq: Every day | ORAL | 0 refills | Status: DC

## 2018-03-21 LAB — CUP PACEART REMOTE DEVICE CHECK
Date Time Interrogation Session: 20191124004022
MDC IDC PG IMPLANT DT: 20180803

## 2018-03-22 ENCOUNTER — Telehealth: Payer: Self-pay

## 2018-03-22 NOTE — Telephone Encounter (Signed)
Spoke with pt regarding episode of NSVT on 03/20/2018 pt stated that she couldn't remember if it was Friday or saturday that she had a dizzy spell. Educated pt about symptom activator informed pt that I would review this with GT and call back with any recommendations pt voiced understanding  Episode reviewed w/ GT per GT "watchul waiting"

## 2018-03-23 ENCOUNTER — Other Ambulatory Visit: Payer: Self-pay | Admitting: Internal Medicine

## 2018-03-25 ENCOUNTER — Encounter: Payer: Self-pay | Admitting: Internal Medicine

## 2018-03-25 ENCOUNTER — Ambulatory Visit (INDEPENDENT_AMBULATORY_CARE_PROVIDER_SITE_OTHER): Payer: Medicare HMO | Admitting: Internal Medicine

## 2018-03-25 VITALS — BP 122/70 | HR 78 | Temp 97.9°F | Wt 135.0 lb

## 2018-03-25 DIAGNOSIS — R350 Frequency of micturition: Secondary | ICD-10-CM | POA: Diagnosis not present

## 2018-03-25 DIAGNOSIS — N3000 Acute cystitis without hematuria: Secondary | ICD-10-CM | POA: Diagnosis not present

## 2018-03-25 DIAGNOSIS — R3989 Other symptoms and signs involving the genitourinary system: Secondary | ICD-10-CM

## 2018-03-25 LAB — POC URINALSYSI DIPSTICK (AUTOMATED)
Bilirubin, UA: NEGATIVE
GLUCOSE UA: NEGATIVE
Ketones, UA: NEGATIVE
Nitrite, UA: NEGATIVE
Protein, UA: POSITIVE — AB
Spec Grav, UA: 1.015 (ref 1.010–1.025)
Urobilinogen, UA: 0.2 E.U./dL
pH, UA: 6 (ref 5.0–8.0)

## 2018-03-25 MED ORDER — CIPROFLOXACIN HCL 250 MG PO TABS: 250.0000 mg | ORAL_TABLET | Freq: Two times a day (BID) | ORAL | 0 refills | Status: DC

## 2018-03-25 NOTE — Patient Instructions (Signed)
Urinary Tract Infection, Adult A urinary tract infection (UTI) is an infection of any part of the urinary tract. The urinary tract includes:  The kidneys.  The ureters.  The bladder.  The urethra. These organs make, store, and get rid of pee (urine) in the body. What are the causes? This is caused by germs (bacteria) in your genital area. These germs grow and cause swelling (inflammation) of your urinary tract. What increases the risk? You are more likely to develop this condition if:  You have a small, thin tube (catheter) to drain pee.  You cannot control when you pee or poop (incontinence).  You are female, and: ? You use these methods to prevent pregnancy: ? A medicine that kills sperm (spermicide). ? A device that blocks sperm (diaphragm). ? You have low levels of a female hormone (estrogen). ? You are pregnant.  You have genes that add to your risk.  You are sexually active.  You take antibiotic medicines.  You have trouble peeing because of: ? A prostate that is bigger than normal, if you are female. ? A blockage in the part of your body that drains pee from the bladder (urethra). ? A kidney stone. ? A nerve condition that affects your bladder (neurogenic bladder). ? Not getting enough to drink. ? Not peeing often enough.  You have other conditions, such as: ? Diabetes. ? A weak disease-fighting system (immune system). ? Sickle cell disease. ? Gout. ? Injury of the spine. What are the signs or symptoms? Symptoms of this condition include:  Needing to pee right away (urgently).  Peeing often.  Peeing small amounts often.  Pain or burning when peeing.  Blood in the pee.  Pee that smells bad or not like normal.  Trouble peeing.  Pee that is cloudy.  Fluid coming from the vagina, if you are female.  Pain in the belly or lower back. Other symptoms include:  Throwing up (vomiting).  No urge to eat.  Feeling mixed up (confused).  Being tired  and grouchy (irritable).  A fever.  Watery poop (diarrhea). How is this treated? This condition may be treated with:  Antibiotic medicine.  Other medicines.  Drinking enough water. Follow these instructions at home:  Medicines  Take over-the-counter and prescription medicines only as told by your doctor.  If you were prescribed an antibiotic medicine, take it as told by your doctor. Do not stop taking it even if you start to feel better. General instructions  Make sure you: ? Pee until your bladder is empty. ? Do not hold pee for a long time. ? Empty your bladder after sex. ? Wipe from front to back after pooping if you are a female. Use each tissue one time when you wipe.  Drink enough fluid to keep your pee pale yellow.  Keep all follow-up visits as told by your doctor. This is important. Contact a doctor if:  You do not get better after 1-2 days.  Your symptoms go away and then come back. Get help right away if:  You have very bad back pain.  You have very bad pain in your lower belly.  You have a fever.  You are sick to your stomach (nauseous).  You are throwing up. Summary  A urinary tract infection (UTI) is an infection of any part of the urinary tract.  This condition is caused by germs in your genital area.  There are many risk factors for a UTI. These include having a small, thin   tube to drain pee and not being able to control when you pee or poop.  Treatment includes antibiotic medicines for germs.  Drink enough fluid to keep your pee pale yellow. This information is not intended to replace advice given to you by your health care provider. Make sure you discuss any questions you have with your health care provider. Document Released: 08/13/2007 Document Revised: 09/03/2017 Document Reviewed: 09/03/2017 Elsevier Interactive Patient Education  2019 [employer]  

## 2018-03-25 NOTE — Addendum Note (Signed)
Addended by: Lurlean Nanny on: 03/25/2018 04:29 PM   Modules accepted: Orders

## 2018-03-25 NOTE — Progress Notes (Signed)
HPI  Pt presents to the clinic today with c/o urinary frequency and bladder pressure. She reports this started 3 days ago. She denies urgency, dysuria or blood in her urine. She denies fever, chills, nausea or low back pain. She denies vaginal discharge or odor. She has not taken any meds OTC.   Review of Systems  Past Medical History:  Diagnosis Date  . Cardiomyopathy    nonischemic  . Chronic back pain   . Complication of anesthesia   . Erosive esophagitis    LA Classification Grade B  . Essential hypertension   . GERD (gastroesophageal reflux disease)   . Hiatal hernia   . Hyperlipidemia   . Insomnia   . MDD (major depressive disorder)   . Melanoma ([place])    basil cell, pt reports no melanoma  . Migraine   . Osteopenia   . PONV (postoperative nausea and vomiting)   . Rosacea   . Sessile colonic polyp 10/2011   adenoma  . Squamous carcinoma    scalp    Family History  Problem Relation Age of Onset  . Alzheimer's disease Mother   . Heart disease Father   . Arthritis/Rheumatoid Sister   . Colon cancer Neg Hx   . Stomach cancer Neg Hx   . Rectal cancer Neg Hx   . Esophageal cancer Neg Hx     Social History   Socioeconomic History  . Marital status: Widowed    Spouse name: Not on file  . Number of children: 3  . Years of education: 38  . Highest education level: Not on file  Occupational History  . Not on file  Social Needs  . Financial resource strain: Not on file  . Food insecurity:    Worry: Not on file    Inability: Not on file  . Transportation needs:    Medical: Not on file    Non-medical: Not on file  Tobacco Use  . Smoking status: Former Smoker    Last attempt to quit: 06/03/1981    Years since quitting: 36.8  . Smokeless tobacco: Never Used  Substance and Sexual Activity  . Alcohol use: Yes    Alcohol/week: 3.0 standard drinks    Types: 3 Glasses of wine per week    Comment: Wine per night  . Drug use: No  . Sexual activity: Not on file   Lifestyle  . Physical activity:    Days per week: Not on file    Minutes per session: Not on file  . Stress: Not on file  Relationships  . Social connections:    Talks on phone: Not on file    Gets together: Not on file    Attends religious service: Not on file    Active member of club or organization: Not on file    Attends meetings of clubs or organizations: Not on file    Relationship status: Not on file  . Intimate partner violence:    Fear of current or ex partner: Not on file    Emotionally abused: Not on file    Physically abused: Not on file    Forced sexual activity: Not on file  Other Topics Concern  . Not on file  Social History Narrative   Lives alone in two-story home.     Her husband passed away unexpectedly.     They have three children- one is a pediatrician.   Desires CPR.   Caffeine use: Decaf coffee   Right handed  Allergies  Allergen Reactions  . Penicillin G Swelling    Rash,  . Penicillins Anaphylaxis and Hives    Has patient had a PCN reaction causing immediate rash, facial/tongue/throat swelling, SOB or lightheadedness with hypotension: {Yes Has patient had a PCN reaction causing severe rash involving mucus membranes or skin necrosis: {no Has patient had a PCN reaction that required hospitalization {yes Has patient had a PCN reaction occurring within the last 10 years: {no If all of the above answers are "NO", then may proceed with Cephalosporin use.  . Clindamycin Itching    rash  . Lansoprazole Other (See Comments)    REACTION: headaches  . Lincomycin Itching    rash  . Sulfa Antibiotics Hives  . Sulfasalazine Hives  . Cefaclor Itching and Rash    rash  . Cefuroxime Axetil Rash and Itching    rash  . Clindamycin/Lincomycin Itching and Rash  . Latex Itching and Rash    rash  . Lincomycin Hcl Itching and Rash  . Sulfamethoxazole-Trimethoprim Rash     Constitutional: Denies fever, malaise, fatigue, headache or abrupt weight changes.    GU: Pt reports frequency and bladder pressure. Denies urgency, dysuria, burning sensation, blood in urine, odor or discharge. Skin: Denies redness, rashes, lesions or ulcercations.   No other specific complaints in a complete review of systems (except as listed in HPI above).    Objective:   Physical Exam  BP 122/70   Pulse 78   Temp 97.9 F (36.6 C) (Oral)   Wt 135 lb (61.2 kg)   SpO2 97%   BMI 24.69 kg/m   Wt Readings from Last 3 Encounters:  03/25/18 135 lb (61.2 kg)  02/01/18 134 lb (60.8 kg)  10/23/17 135 lb (61.2 kg)    General: Appears her stated age, well developed, well nourished in NAD. Abdomen: Soft. Normal bowel sounds. No distention or masses noted.  Tender to palpation over the bladder area. No CVA tenderness.        Assessment & Plan:   Frequency, Bladder Pressure secondary to UTI:  Urinalysis: 3+ leuks, 1+ blood Not enough urine to send urine culture eRx sent if for Cipro 250 mg BID x 5 days OK to take AZO OTC Drink plenty of fluids  RTC as needed or if symptoms persist. Webb Silversmith, NP

## 2018-04-05 ENCOUNTER — Ambulatory Visit (INDEPENDENT_AMBULATORY_CARE_PROVIDER_SITE_OTHER): Payer: Medicare HMO | Admitting: Primary Care

## 2018-04-05 ENCOUNTER — Encounter: Payer: Self-pay | Admitting: Primary Care

## 2018-04-05 ENCOUNTER — Ambulatory Visit (INDEPENDENT_AMBULATORY_CARE_PROVIDER_SITE_OTHER)
Admission: RE | Admit: 2018-04-05 | Discharge: 2018-04-05 | Disposition: A | Discharge status: Home / Self Care | Payer: Medicare HMO | Source: Ambulatory Visit | Attending: Primary Care | Admitting: Primary Care

## 2018-04-05 VITALS — BP 122/76 | HR 67 | Temp 98.0°F | Ht 62.0 in | Wt 135.5 lb

## 2018-04-05 DIAGNOSIS — M25512 Pain in left shoulder: Secondary | ICD-10-CM | POA: Diagnosis not present

## 2018-04-05 DIAGNOSIS — M79622 Pain in left upper arm: Secondary | ICD-10-CM | POA: Diagnosis not present

## 2018-04-05 DIAGNOSIS — R350 Frequency of micturition: Secondary | ICD-10-CM

## 2018-04-05 LAB — POC URINALSYSI DIPSTICK (AUTOMATED)
Bilirubin, UA: NEGATIVE
Blood, UA: NEGATIVE
Glucose, UA: NEGATIVE
Ketones, UA: NEGATIVE
Leukocytes, UA: NEGATIVE
Nitrite, UA: NEGATIVE
PH UA: 5.5 (ref 5.0–8.0)
Protein, UA: NEGATIVE
Spec Grav, UA: 1.015 (ref 1.010–1.025)
Urobilinogen, UA: 0.2 E.U./dL

## 2018-04-05 MED ORDER — NAPROXEN 500 MG PO TABS: 500.0000 mg | ORAL_TABLET | Freq: Two times a day (BID) | ORAL | 0 refills | Status: DC

## 2018-04-05 NOTE — Assessment & Plan Note (Signed)
Since fall in November 2019.  Exam today suspicious for bursitis, may also have small rotator cuff tear.  Check plain films today given duration of symptoms and exam. Rx for Naproxen 500 mg BID course sent to pharmacy. Will have her seen by Sports Medicine if no improvement. She kindly declines PT.

## 2018-04-05 NOTE — Addendum Note (Signed)
Addended by: Jacqualin Combes on: 04/05/2018 01:11 PM   Modules accepted: Orders

## 2018-04-05 NOTE — Patient Instructions (Signed)
Start naproxen 500 mg tablets for inflammation and pain to the left shoulder. Take 1 tablet twice daily with food for at least one week.  Complete xray(s) prior to leaving today. I will notify you of your results once received.  Please schedule an appointment with Dr. Lorelei Pont for follow up in 2 weeks.   It was a pleasure to see you today!
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
## 2018-04-06 ENCOUNTER — Ambulatory Visit (INDEPENDENT_AMBULATORY_CARE_PROVIDER_SITE_OTHER): Payer: Medicare HMO

## 2018-04-06 DIAGNOSIS — R55 Syncope and collapse: Secondary | ICD-10-CM

## 2018-04-07 NOTE — Progress Notes (Signed)
Carelink Summary Report / Loop Recorder 

## 2018-04-08 LAB — CUP PACEART REMOTE DEVICE CHECK
Date Time Interrogation Session: 20200129013534
Implantable Pulse Generator Implant Date: 20180803

## 2018-04-14 ENCOUNTER — Other Ambulatory Visit: Payer: Self-pay | Admitting: Primary Care

## 2018-04-14 DIAGNOSIS — R519 Headache, unspecified: Secondary | ICD-10-CM

## 2018-04-14 DIAGNOSIS — R51 Headache: Principal | ICD-10-CM

## 2018-04-14 DIAGNOSIS — G4700 Insomnia, unspecified: Secondary | ICD-10-CM

## 2018-04-14 NOTE — Telephone Encounter (Signed)
promethazine (PHENERGAN) 12.5 MG tablet - last prescribed on 01/05/2018  zolpidem (AMBIEN) 10 MG tablet - last prescribed on 01/19/2018 #90 with 0 refills .  Last office visit on 04/05/2018. No future appointment

## 2018-04-14 NOTE — Telephone Encounter (Signed)
Noted.  Refill sent to pharmacy. 

## 2018-04-15 DIAGNOSIS — D225 Melanocytic nevi of trunk: Secondary | ICD-10-CM | POA: Diagnosis not present

## 2018-04-15 DIAGNOSIS — D2262 Melanocytic nevi of left upper limb, including shoulder: Secondary | ICD-10-CM | POA: Diagnosis not present

## 2018-04-15 DIAGNOSIS — X32XXXA Exposure to sunlight, initial encounter: Secondary | ICD-10-CM | POA: Diagnosis not present

## 2018-04-15 DIAGNOSIS — Z08 Encounter for follow-up examination after completed treatment for malignant neoplasm: Secondary | ICD-10-CM | POA: Diagnosis not present

## 2018-04-15 DIAGNOSIS — Z85828 Personal history of other malignant neoplasm of skin: Secondary | ICD-10-CM | POA: Diagnosis not present

## 2018-04-15 DIAGNOSIS — L570 Actinic keratosis: Secondary | ICD-10-CM | POA: Diagnosis not present

## 2018-04-15 DIAGNOSIS — L718 Other rosacea: Secondary | ICD-10-CM | POA: Diagnosis not present

## 2018-04-15 DIAGNOSIS — D2261 Melanocytic nevi of right upper limb, including shoulder: Secondary | ICD-10-CM | POA: Diagnosis not present

## 2018-04-21 ENCOUNTER — Other Ambulatory Visit: Payer: Self-pay | Admitting: Primary Care

## 2018-04-21 DIAGNOSIS — E7800 Pure hypercholesterolemia, unspecified: Secondary | ICD-10-CM

## 2018-05-10 ENCOUNTER — Ambulatory Visit (INDEPENDENT_AMBULATORY_CARE_PROVIDER_SITE_OTHER): Payer: Medicare HMO | Admitting: *Deleted

## 2018-05-10 DIAGNOSIS — R55 Syncope and collapse: Secondary | ICD-10-CM

## 2018-05-10 LAB — CUP PACEART REMOTE DEVICE CHECK
Date Time Interrogation Session: 20200302013736
Implantable Pulse Generator Implant Date: 20180803

## 2018-05-11 ENCOUNTER — Encounter: Payer: Self-pay | Admitting: Internal Medicine

## 2018-05-11 ENCOUNTER — Ambulatory Visit (INDEPENDENT_AMBULATORY_CARE_PROVIDER_SITE_OTHER): Payer: Medicare HMO | Admitting: Internal Medicine

## 2018-05-11 VITALS — BP 128/74 | HR 64 | Ht 62.0 in | Wt 136.6 lb

## 2018-05-11 DIAGNOSIS — I429 Cardiomyopathy, unspecified: Secondary | ICD-10-CM

## 2018-05-11 DIAGNOSIS — I5022 Chronic systolic (congestive) heart failure: Secondary | ICD-10-CM | POA: Diagnosis not present

## 2018-05-11 DIAGNOSIS — R55 Syncope and collapse: Secondary | ICD-10-CM | POA: Diagnosis not present

## 2018-05-11 DIAGNOSIS — I447 Left bundle-branch block, unspecified: Secondary | ICD-10-CM | POA: Diagnosis not present

## 2018-05-11 DIAGNOSIS — I471 Supraventricular tachycardia: Secondary | ICD-10-CM | POA: Diagnosis not present

## 2018-05-11 NOTE — Progress Notes (Signed)
HPI [name] returns today for ongoing evaluation and management of syncope.  She is a pleasant 73 year old woman with a history of unexplained syncope, left bundle branch block, very mild left ventricular dysfunction, who underwent insertion of an implantable loop recorder secondary to all the above.  She denies chest pain or shortness of breath and she has not had syncope. On her ILR interogation she had an episode of NSVT at 180/min. She was asymptomatic. She has had no worsening of her symptoms.  Allergies  Allergen Reactions  . Penicillin G Swelling    Rash,  . Penicillins Anaphylaxis and Hives    Has patient had a PCN reaction causing immediate rash, facial/tongue/throat swelling, SOB or lightheadedness with hypotension: {Yes Has patient had a PCN reaction causing severe rash involving mucus membranes or skin necrosis: {no Has patient had a PCN reaction that required hospitalization {yes Has patient had a PCN reaction occurring within the last 10 years: {no If all of the above answers are "NO", then may proceed with Cephalosporin use.  . Clindamycin Itching    rash  . Lansoprazole Other (See Comments)    REACTION: headaches  . Lincomycin Itching    rash  . Sulfa Antibiotics Hives  . Sulfasalazine Hives  . Cefaclor Itching and Rash    rash  . Cefuroxime Axetil Rash and Itching    rash  . Clindamycin/Lincomycin Itching and Rash  . Latex Itching and Rash    rash  . Lincomycin Hcl Itching and Rash  . Sulfamethoxazole-Trimethoprim Rash     Current Outpatient Medications  Medication Sig Dispense Refill  . acetaminophen (TYLENOL) 500 MG tablet Take 1,000 mg by mouth every 6 (six) hours as needed for moderate pain or headache.    Marland Kitchen amLODipine (NORVASC) 2.5 MG tablet Take 1 tablet (2.5 mg total) by mouth daily. 90 tablet 3  . carvedilol (COREG) 12.5 MG tablet Please keep upcoming appt for future refills. Thank you. 180 tablet 0  . Cholecalciferol (VITAMIN D3) 2000 units  capsule Take 2,000 Units by mouth daily.    . ciprofloxacin (CIPRO) 250 MG tablet Take 1 tablet (250 mg total) by mouth 2 (two) times daily. 10 tablet 0  . enalapril (VASOTEC) 10 MG tablet Take 1 tablet (10 mg total) by mouth daily. Please keep upcoming appt for future refills. Thank you. 90 tablet 0  . escitalopram (LEXAPRO) 20 MG tablet Take 1 tablet (20 mg total) by mouth daily. 90 tablet 3  . metroNIDAZOLE (METROCREAM) 0.75 % cream Apply 1 application topically once a week.     . Multiple Vitamin (MULTIVITAMIN) capsule Take 1 capsule by mouth daily.      . naproxen (NAPROSYN) 500 MG tablet Take 1 tablet (500 mg total) by mouth 2 (two) times daily with a meal. For pain and inflammation. 30 tablet 0  . pantoprazole (PROTONIX) 40 MG tablet Take 1 tablet (40 mg total) by mouth 2 (two) times daily. 180 tablet 3  . Polyvinyl Alcohol-Povidone (REFRESH OP) Apply to eye.    . promethazine (PHENERGAN) 12.5 MG tablet TAKE ONE TABLET BY MOUTH EVERY 8 HOURS AS NEEDED FOR NAUSEA/VOMITING 20 tablet 0  . rosuvastatin (CRESTOR) 20 MG tablet TAKE ONE TABLET (20 MG) BY MOUTH AT BEDTIME FOR CHOLESTEROL 90 tablet 1  . zolpidem (AMBIEN) 10 MG tablet TAKE ONE TABLET (10 MG) BY MOUTH AT BEDTIME AS NEEDED FOR SLEEP 90 tablet 0  . nitroGLYCERIN (NITROSTAT) 0.4 MG SL tablet Place 1 tablet (0.4  mg total) under the tongue every 5 (five) minutes as needed for chest pain. 30 tablet 3   No current facility-administered medications for this visit.      Past Medical History:  Diagnosis Date  . Cardiomyopathy    nonischemic  . Chronic back pain   . Complication of anesthesia   . Erosive esophagitis    LA Classification Grade B  . Essential hypertension   . GERD (gastroesophageal reflux disease)   . Hiatal hernia   . Hyperlipidemia   . Insomnia   . MDD (major depressive disorder)   . Melanoma ([place])    basil cell, pt reports no melanoma  . Migraine   . Osteopenia   . PONV (postoperative nausea and vomiting)   .  Rosacea   . Sessile colonic polyp 10/2011   adenoma  . Squamous carcinoma    scalp    ROS:   All systems reviewed and negative except as noted in the HPI.   Past Surgical History:  Procedure Laterality Date  . APPENDECTOMY    . CARDIAC CATHETERIZATION    . CATARACT EXTRACTION W/PHACO Right 03/18/2016   Procedure: CATARACT EXTRACTION PHACO AND INTRAOCULAR LENS PLACEMENT (IOC);  Surgeon: Birder [name], MD;  Location: [facility];  Service: Ophthalmology;  Laterality: Right;  [place] 45.3 AP% 22.2 CDE 10.07 Fluid pack lot # [id number]  . CATARACT EXTRACTION W/PHACO Left 04/08/2016   Procedure: CATARACT EXTRACTION PHACO AND INTRAOCULAR LENS PLACEMENT (IOC);  Surgeon: Birder [name], MD;  Location: [facility];  Service: Ophthalmology;  Laterality: Left;  [place] 00:41 AP% 15.0 CDE 6.27 Fluid pack lot # [id number]  . LOOP RECORDER INSERTION N/A 10/10/2016   Procedure: Loop Recorder Insertion;  Surgeon: Evans Lance, MD;  Location: [facility];  Service: Cardiovascular;  Laterality: N/A;  . SKIN CANCER EXCISION     scalp  . TUBAL LIGATION    . VAGINAL HYSTERECTOMY       Family History  Problem Relation Age of Onset  . Alzheimer's disease Mother   . Heart disease Father   . Arthritis/Rheumatoid Sister   . Colon cancer Neg Hx   . Stomach cancer Neg Hx   . Rectal cancer Neg Hx   . Esophageal cancer Neg Hx      Social History   Socioeconomic History  . Marital status: Widowed    Spouse name: Not on file  . Number of children: 3  . Years of education: 26  . Highest education level: Not on file  Occupational History  . Not on file  Social Needs  . Financial resource strain: Not on file  . Food insecurity:    Worry: Not on file    Inability: Not on file  . Transportation needs:    Medical: Not on file    Non-medical: Not on file  Tobacco Use  . Smoking status: Former Smoker    Last attempt to quit: 06/03/1981    Years since quitting: 36.9  . Smokeless tobacco: Never Used    Substance and Sexual Activity  . Alcohol use: Yes    Alcohol/week: 3.0 standard drinks    Types: 3 Glasses of wine per week    Comment: Wine per night  . Drug use: No  . Sexual activity: Not on file  Lifestyle  . Physical activity:    Days per week: Not on file    Minutes per session: Not on file  . Stress: Not on file  Relationships  . Social connections:  Talks on phone: Not on file    Gets together: Not on file    Attends religious service: Not on file    Active member of club or organization: Not on file    Attends meetings of clubs or organizations: Not on file    Relationship status: Not on file  . Intimate partner violence:    Fear of current or ex partner: Not on file    Emotionally abused: Not on file    Physically abused: Not on file    Forced sexual activity: Not on file  Other Topics Concern  . Not on file  Social History Narrative   Lives alone in two-story home.     Her husband passed away unexpectedly.     They have three children- one is a pediatrician.   Desires CPR.   Caffeine use: Decaf coffee   Right handed     BP 128/74   Pulse 64   Ht 5\' 2"  (1.575 m)   Wt 136 lb 9.6 oz (62 kg)   SpO2 98%   BMI 24.98 kg/m   Physical Exam:  Well appearing NAD HEENT: Unremarkable Neck:  No JVD, no thyromegally Lymphatics:  No adenopathy Back:  No CVA tenderness Lungs:  Clear HEART:  Regular rate rhythm, no murmurs, no rubs, no clicks Abd:  soft, positive bowel sounds, no organomegally, no rebound, no guarding Ext:  2 plus pulses, no edema, no cyanosis, no clubbing Skin:  No rashes no nodules Neuro:  CN II through XII intact, motor grossly intact  EKG - nsr with LBBB  DEVICE  Normal device function.  See PaceArt for details.   Assess/Plan: 1. Syncope - she has been asymptomatic. She has LBBB and mild LV dysfunction and NSVT by her ILR. I have recommended watchful waiting. No symptoms with the NSVT. 2. Chronic systolic heart failure - her symptoms  are class 2. She will continue her current meds. 3. NSVT - the patient was asymptomatic. I am concerned about scar in the LV and she will undergo MRI of the heart looking for gadolinium uptake.  4. LBBB - no evidence of [place] syncope CHB although she is at risk for this.  [name].D.

## 2018-05-11 NOTE — Patient Instructions (Addendum)
Medication Instructions:  Your physician recommends that you continue on your current medications as directed. Please refer to the Current Medication list given to you today.  Labwork: None ordered.  Testing/Procedures: You will be scheduled for a cardiac MRI with and without contrast for evaluate cardiomyopathy  Please schedule at [place]: Your physician wants you to follow-up in: one year with Dr. Lovena Le.   You will receive a reminder letter in the mail two months in advance. If you don't receive a letter, please call our office to schedule the follow-up appointment.  Remote monitoring monthly  Any Other Special Instructions Will Be Listed Below (If Applicable).  If you need a refill on your cardiac medications before your next appointment, please call your pharmacy.

## 2018-05-17 ENCOUNTER — Other Ambulatory Visit: Payer: Self-pay

## 2018-05-17 MED ORDER — AMLODIPINE BESYLATE 2.5 MG PO TABS: 2.5000 mg | ORAL_TABLET | Freq: Every day | ORAL | 3 refills | Status: DC

## 2018-05-17 NOTE — Progress Notes (Signed)
Carelink Summary Report / Loop Recorder 

## 2018-05-25 ENCOUNTER — Other Ambulatory Visit: Payer: Self-pay | Admitting: Internal Medicine

## 2018-06-03 ENCOUNTER — Ambulatory Visit (HOSPITAL_COMMUNITY): Admission: RE | Admit: 2018-06-03 | Payer: Medicare HMO | Source: Ambulatory Visit

## 2018-06-04 ENCOUNTER — Other Ambulatory Visit (HOSPITAL_COMMUNITY): Payer: Medicare HMO

## 2018-06-11 ENCOUNTER — Ambulatory Visit (INDEPENDENT_AMBULATORY_CARE_PROVIDER_SITE_OTHER): Payer: Medicare HMO | Admitting: *Deleted

## 2018-06-11 ENCOUNTER — Other Ambulatory Visit: Payer: Self-pay

## 2018-06-11 DIAGNOSIS — R55 Syncope and collapse: Secondary | ICD-10-CM | POA: Diagnosis not present

## 2018-06-12 LAB — CUP PACEART REMOTE DEVICE CHECK
Date Time Interrogation Session: 20200404020632
Implantable Pulse Generator Implant Date: 20180803

## 2018-06-17 NOTE — Progress Notes (Signed)
Carelink Summary Report / Loop Recorder 

## 2018-07-06 ENCOUNTER — Other Ambulatory Visit: Payer: Self-pay | Admitting: Primary Care

## 2018-07-06 DIAGNOSIS — G4700 Insomnia, unspecified: Secondary | ICD-10-CM

## 2018-07-06 DIAGNOSIS — F331 Major depressive disorder, recurrent, moderate: Secondary | ICD-10-CM

## 2018-07-07 ENCOUNTER — Ambulatory Visit (HOSPITAL_COMMUNITY): Admission: RE | Admit: 2018-07-07 | Payer: Medicare HMO | Source: Ambulatory Visit

## 2018-07-07 NOTE — Telephone Encounter (Signed)
zolpidem (AMBIEN) 10 MG tablet - last prescribed on 04/14/2018 #90 with 0 refills .  escitalopram (LEXAPRO) 20 MG tablet- last prescribed on 06/22/2017 #90 with 3 refills.  Last office visit on 04/05/2018. No future appointment

## 2018-07-07 NOTE — Telephone Encounter (Signed)
Noted, refills sent to pharmacy. 

## 2018-07-19 ENCOUNTER — Other Ambulatory Visit: Payer: Self-pay | Admitting: Primary Care

## 2018-07-19 DIAGNOSIS — R519 Headache, unspecified: Secondary | ICD-10-CM

## 2018-07-19 NOTE — Telephone Encounter (Signed)
Noted, refill sent to pharmacy,

## 2018-07-19 NOTE — Telephone Encounter (Signed)
Last prescribed on 04/14/2018 #20 with 0 refills. Last office visit on 04/05/2018. No future appointment

## 2018-07-21 ENCOUNTER — Other Ambulatory Visit: Payer: Self-pay

## 2018-07-21 ENCOUNTER — Ambulatory Visit (INDEPENDENT_AMBULATORY_CARE_PROVIDER_SITE_OTHER): Payer: Medicare HMO | Admitting: *Deleted

## 2018-07-21 DIAGNOSIS — I447 Left bundle-branch block, unspecified: Secondary | ICD-10-CM

## 2018-07-21 DIAGNOSIS — R55 Syncope and collapse: Secondary | ICD-10-CM | POA: Diagnosis not present

## 2018-07-21 LAB — CUP PACEART REMOTE DEVICE CHECK
Date Time Interrogation Session: 20200513074125
Implantable Pulse Generator Implant Date: 20180803

## 2018-08-09 NOTE — Progress Notes (Signed)
Carelink Summary Report / Loop Recorder 

## 2018-08-13 ENCOUNTER — Ambulatory Visit (INDEPENDENT_AMBULATORY_CARE_PROVIDER_SITE_OTHER): Payer: Medicare HMO | Admitting: Primary Care

## 2018-08-13 ENCOUNTER — Encounter: Payer: Self-pay | Admitting: Primary Care

## 2018-08-13 DIAGNOSIS — H6983 Other specified disorders of Eustachian tube, bilateral: Secondary | ICD-10-CM | POA: Diagnosis not present

## 2018-08-13 NOTE — Patient Instructions (Signed)
Nasal Congestion/Ear Pressure/Sinus Pressure: Try using Flonase (fluticasone) nasal spray. Instill 1 spray in each nostril twice daily.   You can also try an antihistamine such as Claritin, Allegra, Zyrtec.  Please update me if no improvement.  It was a pleasure to see you today! Allie Bossier, NP-C

## 2018-08-13 NOTE — Progress Notes (Signed)
Subjective:    Patient ID: [name], female    DOB: 10-13-1945, 73 y.o.   MRN: [id number]  HPI  Virtual Visit via Video Note  I connected with [name] on 08/13/18 at  3:40 PM EDT by a video enabled telemedicine application and verified that I am speaking with the correct person using two identifiers.  Location: Patient: Home Provider: Office   I discussed the limitations of evaluation and management by telemedicine and the availability of in person appointments. The patient expressed understanding and agreed to proceed.  History of Present Illness:   [name] is a 73 year old female with a history of GERD, hypertension managed on ACE, eustachian tube dysfunction who presents today with a chief complaint of ear fullness.  She also reports sore throat. The fullness is located to the bilateral ears which began two days ago. She denies fevers, cough, post nasal drip. She has not tried anything for her symptoms. She believes this is more allergies/eustacian tube involvement and is wanting to know what she can use/take.    Observations/Objective:  Alert and oriented. Appears well, not sickly. No distress. Speaking in complete sentences.   Assessment and Plan:  Suspect symptoms are secondary to allergies/eustacian tube dysfunction. She appears well, not sickly. Other differentials include cerumen impaction for which we cannot evaluate at this time.  Recommended she try an oral antihistamine and Flonase as needed. Update if no improvement.   Follow Up Instructions:  Nasal Congestion/Ear Pressure/Sinus Pressure: Try using Flonase (fluticasone) nasal spray. Instill 1 spray in each nostril twice daily.   You can also try an antihistamine such as Claritin, Allegra, Zyrtec.  Please update me if no improvement.  It was a pleasure to see you today! Allie Bossier, NP-C    I discussed the assessment and treatment plan with the patient. The patient was provided an  opportunity to ask questions and all were answered. The patient agreed with the plan and demonstrated an understanding of the instructions.   The patient was advised to call back or seek an in-person evaluation if the symptoms worsen or if the condition fails to improve as anticipated.     [name], NP    Review of Systems  Constitutional: Negative for fever.  HENT: Positive for postnasal drip and sore throat. Negative for ear pain and sinus pressure.        Ear fullness  Respiratory: Negative for cough and shortness of breath.   Allergic/Immunologic: Positive for environmental allergies.       Past Medical History:  Diagnosis Date  . Cardiomyopathy    nonischemic  . Chronic back pain   . Complication of anesthesia   . Erosive esophagitis    LA Classification Grade B  . Essential hypertension   . GERD (gastroesophageal reflux disease)   . Hiatal hernia   . Hyperlipidemia   . Insomnia   . MDD (major depressive disorder)   . Melanoma ([place])    basil cell, pt reports no melanoma  . Migraine   . Osteopenia   . PONV (postoperative nausea and vomiting)   . Rosacea   . Sessile colonic polyp 10/2011   adenoma  . Squamous carcinoma    scalp     Social History   Socioeconomic History  . Marital status: Widowed    Spouse name: Not on file  . Number of children: 3  . Years of education: 84  . Highest education level: Not on file  Occupational History  . Not on file  Social Needs  . Financial resource strain: Not on file  . Food insecurity:    Worry: Not on file    Inability: Not on file  . Transportation needs:    Medical: Not on file    Non-medical: Not on file  Tobacco Use  . Smoking status: Former Smoker    Last attempt to quit: 06/03/1981    Years since quitting: 37.2  . Smokeless tobacco: Never Used  Substance and Sexual Activity  . Alcohol use: Yes    Alcohol/week: 3.0 standard drinks    Types: 3 Glasses of wine per week    Comment: Wine per  night  . Drug use: No  . Sexual activity: Not on file  Lifestyle  . Physical activity:    Days per week: Not on file    Minutes per session: Not on file  . Stress: Not on file  Relationships  . Social connections:    Talks on phone: Not on file    Gets together: Not on file    Attends religious service: Not on file    Active member of club or organization: Not on file    Attends meetings of clubs or organizations: Not on file    Relationship status: Not on file  . Intimate partner violence:    Fear of current or ex partner: Not on file    Emotionally abused: Not on file    Physically abused: Not on file    Forced sexual activity: Not on file  Other Topics Concern  . Not on file  Social History Narrative   Lives alone in two-story home.     Her husband passed away unexpectedly.     They have three children- one is a pediatrician.   Desires CPR.   Caffeine use: Decaf coffee   Right handed    Past Surgical History:  Procedure Laterality Date  . APPENDECTOMY    . CARDIAC CATHETERIZATION    . CATARACT EXTRACTION W/PHACO Right 03/18/2016   Procedure: CATARACT EXTRACTION PHACO AND INTRAOCULAR LENS PLACEMENT (IOC);  Surgeon: Birder [name], MD;  Location: [facility];  Service: Ophthalmology;  Laterality: Right;  [place] 45.3 AP% 22.2 CDE 10.07 Fluid pack lot # [id number]  . CATARACT EXTRACTION W/PHACO Left 04/08/2016   Procedure: CATARACT EXTRACTION PHACO AND INTRAOCULAR LENS PLACEMENT (IOC);  Surgeon: Birder [name], MD;  Location: [facility];  Service: Ophthalmology;  Laterality: Left;  [place] 00:41 AP% 15.0 CDE 6.27 Fluid pack lot # [id number]  . LOOP RECORDER INSERTION N/A 10/10/2016   Procedure: Loop Recorder Insertion;  Surgeon: Evans Lance, MD;  Location: [facility];  Service: Cardiovascular;  Laterality: N/A;  . SKIN CANCER EXCISION     scalp  . TUBAL LIGATION    . VAGINAL HYSTERECTOMY      Family History  Problem Relation Age of Onset  . Alzheimer's disease Mother    . Heart disease Father   . Arthritis/Rheumatoid Sister   . Colon cancer Neg Hx   . Stomach cancer Neg Hx   . Rectal cancer Neg Hx   . Esophageal cancer Neg Hx     Allergies  Allergen Reactions  . Penicillin G Swelling    Rash,  . Penicillins Anaphylaxis and Hives    Has patient had a PCN reaction causing immediate rash, facial/tongue/throat swelling, SOB or lightheadedness with hypotension: {Yes Has patient had a PCN reaction causing severe rash involving mucus membranes or skin necrosis: {  no Has patient had a PCN reaction that required hospitalization {yes Has patient had a PCN reaction occurring within the last 10 years: {no If all of the above answers are "NO", then may proceed with Cephalosporin use.  . Clindamycin Itching    rash  . Lansoprazole Other (See Comments)    REACTION: headaches  . Lincomycin Itching    rash  . Sulfa Antibiotics Hives  . Sulfasalazine Hives  . Cefaclor Itching and Rash    rash  . Cefuroxime Axetil Rash and Itching    rash  . Clindamycin/Lincomycin Itching and Rash  . Latex Itching and Rash    rash  . Lincomycin Hcl Itching and Rash  . Sulfamethoxazole-Trimethoprim Rash    Current Outpatient Medications on File Prior to Visit  Medication Sig Dispense Refill  . acetaminophen (TYLENOL) 500 MG tablet Take 1,000 mg by mouth every 6 (six) hours as needed for moderate pain or headache.    Marland Kitchen amLODipine (NORVASC) 2.5 MG tablet Take 1 tablet (2.5 mg total) by mouth daily. 90 tablet 3  . carvedilol (COREG) 12.5 MG tablet Please keep upcoming appt for future refills. Thank you. 180 tablet 0  . Cholecalciferol (VITAMIN D3) 2000 units capsule Take 2,000 Units by mouth daily.    . enalapril (VASOTEC) 10 MG tablet Take 1 tablet (10 mg total) by mouth daily. Please keep upcoming appt for future refills. Thank you. 90 tablet 0  . escitalopram (LEXAPRO) 20 MG tablet TAKE 1 TABLET BY MOUTH DAILY 90 tablet 3  . Multiple Vitamin (MULTIVITAMIN) capsule Take 1  capsule by mouth daily.      . naproxen (NAPROSYN) 500 MG tablet Take 1 tablet (500 mg total) by mouth 2 (two) times daily with a meal. For pain and inflammation. 30 tablet 0  . pantoprazole (PROTONIX) 40 MG tablet Take 1 tablet (40 mg total) by mouth 2 (two) times daily. 180 tablet 3  . Polyvinyl Alcohol-Povidone (REFRESH OP) Apply to eye.    . rosuvastatin (CRESTOR) 20 MG tablet TAKE ONE TABLET (20 MG) BY MOUTH AT BEDTIME FOR CHOLESTEROL 90 tablet 1  . zolpidem (AMBIEN) 10 MG tablet TAKE ONE TABLET AT BEDTIME IF NEEDED FORSLEEP 90 tablet 0  . metroNIDAZOLE (METROCREAM) 0.75 % cream Apply 1 application topically once a week.     . nitroGLYCERIN (NITROSTAT) 0.4 MG SL tablet Place 1 tablet (0.4 mg total) under the tongue every 5 (five) minutes as needed for chest pain. (Patient not taking: Reported on 08/13/2018) 30 tablet 3  . promethazine (PHENERGAN) 12.5 MG tablet TAKE ONE TABLET EVERY EIGHT HOURS IF NEEDED FOR NAUSEA AND VOMITING (Patient not taking: Reported on 08/13/2018) 20 tablet 0   No current facility-administered medications on file prior to visit.     Wt 135 lb (61.2 kg)   BMI 24.69 kg/m    Objective:   Physical Exam  Constitutional: She is oriented to person, place, and time. She appears well-nourished. She does not have a sickly appearance. She does not appear ill.  Respiratory: Effort normal.  Neurological: She is alert and oriented to person, place, and time.  Psychiatric: She has a normal mood and affect.           Assessment & Plan:

## 2018-08-13 NOTE — Assessment & Plan Note (Signed)
Suspect symptoms are secondary to allergies/eustacian tube dysfunction. She appears well, not sickly. Other differentials include cerumen impaction for which we cannot evaluate at this time.  Recommended she try an oral antihistamine and Flonase as needed. Update if no improvement.

## 2018-08-17 ENCOUNTER — Other Ambulatory Visit: Payer: Self-pay | Admitting: Physician Assistant

## 2018-08-17 ENCOUNTER — Other Ambulatory Visit: Payer: Self-pay | Admitting: Internal Medicine

## 2018-08-23 ENCOUNTER — Encounter: Payer: Medicare HMO | Admitting: *Deleted

## 2018-09-17 ENCOUNTER — Other Ambulatory Visit: Payer: Self-pay | Admitting: Internal Medicine

## 2018-09-17 ENCOUNTER — Other Ambulatory Visit: Payer: Self-pay | Admitting: Gastroenterology

## 2018-09-20 ENCOUNTER — Encounter: Payer: Self-pay | Admitting: *Deleted

## 2018-09-20 ENCOUNTER — Telehealth: Payer: Self-pay | Admitting: *Deleted

## 2018-09-20 NOTE — Telephone Encounter (Signed)
Left message regarding appointment for Cardiac MRI that was cancelled in March and APril 2020 due to the pandemic.  It is rescheduled to Wednesday 10/06/18 at 8:00 am at [place] time is 7:15 am at the first floor admitting office.  No restrictions.  I will also mail the information to the patient.  Requested call back if this appointment is not conveient.

## 2018-09-24 ENCOUNTER — Ambulatory Visit (INDEPENDENT_AMBULATORY_CARE_PROVIDER_SITE_OTHER): Payer: Medicare HMO | Admitting: *Deleted

## 2018-09-24 DIAGNOSIS — R55 Syncope and collapse: Secondary | ICD-10-CM

## 2018-09-26 LAB — CUP PACEART REMOTE DEVICE CHECK
Date Time Interrogation Session: 20200718093533
Implantable Pulse Generator Implant Date: 20180803

## 2018-09-29 NOTE — Progress Notes (Signed)
Carelink Summary Report / Loop Recorder 

## 2018-09-30 ENCOUNTER — Encounter: Payer: Self-pay | Admitting: *Deleted

## 2018-09-30 ENCOUNTER — Telehealth: Payer: Self-pay | Admitting: *Deleted

## 2018-09-30 NOTE — Telephone Encounter (Signed)
Left message regarding appointment for Cardiac MRI scheduled for Friday 10/15/18 at 8:00 am.  Will mail information to patient as well.

## 2018-10-01 ENCOUNTER — Encounter: Payer: Self-pay | Admitting: Primary Care

## 2018-10-01 DIAGNOSIS — Z1231 Encounter for screening mammogram for malignant neoplasm of breast: Secondary | ICD-10-CM | POA: Diagnosis not present

## 2018-10-01 LAB — HM MAMMOGRAPHY

## 2018-10-06 ENCOUNTER — Other Ambulatory Visit (HOSPITAL_COMMUNITY): Payer: Medicare HMO

## 2018-10-11 ENCOUNTER — Encounter: Payer: Self-pay | Admitting: Primary Care

## 2018-10-11 DIAGNOSIS — N6459 Other signs and symptoms in breast: Secondary | ICD-10-CM | POA: Diagnosis not present

## 2018-10-11 DIAGNOSIS — N6489 Other specified disorders of breast: Secondary | ICD-10-CM | POA: Diagnosis not present

## 2018-10-11 DIAGNOSIS — R922 Inconclusive mammogram: Secondary | ICD-10-CM | POA: Diagnosis not present

## 2018-10-11 DIAGNOSIS — R921 Mammographic calcification found on diagnostic imaging of breast: Secondary | ICD-10-CM | POA: Diagnosis not present

## 2018-10-12 ENCOUNTER — Other Ambulatory Visit: Payer: Self-pay | Admitting: Primary Care

## 2018-10-12 DIAGNOSIS — G4700 Insomnia, unspecified: Secondary | ICD-10-CM

## 2018-10-13 ENCOUNTER — Encounter: Payer: Self-pay | Admitting: Primary Care

## 2018-10-13 NOTE — Telephone Encounter (Signed)
Last prescribed on 07/07/2018. Last appointment on 08/13/2018. No future appointment

## 2018-10-13 NOTE — Telephone Encounter (Signed)
Refill sent to pharmacy. Patient will be due for annual physical in mid September 2020, please schedule.  She will need this for further refills of her medications

## 2018-10-14 ENCOUNTER — Telehealth (HOSPITAL_COMMUNITY): Payer: Self-pay | Admitting: Emergency Medicine

## 2018-10-14 DIAGNOSIS — D2262 Melanocytic nevi of left upper limb, including shoulder: Secondary | ICD-10-CM | POA: Diagnosis not present

## 2018-10-14 DIAGNOSIS — X32XXXA Exposure to sunlight, initial encounter: Secondary | ICD-10-CM | POA: Diagnosis not present

## 2018-10-14 DIAGNOSIS — L820 Inflamed seborrheic keratosis: Secondary | ICD-10-CM | POA: Diagnosis not present

## 2018-10-14 DIAGNOSIS — Z85828 Personal history of other malignant neoplasm of skin: Secondary | ICD-10-CM | POA: Diagnosis not present

## 2018-10-14 DIAGNOSIS — D2261 Melanocytic nevi of right upper limb, including shoulder: Secondary | ICD-10-CM | POA: Diagnosis not present

## 2018-10-14 DIAGNOSIS — D2272 Melanocytic nevi of left lower limb, including hip: Secondary | ICD-10-CM | POA: Diagnosis not present

## 2018-10-14 DIAGNOSIS — L538 Other specified erythematous conditions: Secondary | ICD-10-CM | POA: Diagnosis not present

## 2018-10-14 DIAGNOSIS — L570 Actinic keratosis: Secondary | ICD-10-CM | POA: Diagnosis not present

## 2018-10-14 NOTE — Telephone Encounter (Signed)
Pt wishing to cancel/reschedule test, MRI notified

## 2018-10-15 ENCOUNTER — Ambulatory Visit (HOSPITAL_COMMUNITY): Payer: Medicare HMO

## 2018-10-22 ENCOUNTER — Other Ambulatory Visit: Payer: Self-pay | Admitting: Gastroenterology

## 2018-10-28 ENCOUNTER — Ambulatory Visit (INDEPENDENT_AMBULATORY_CARE_PROVIDER_SITE_OTHER): Payer: Medicare HMO | Admitting: *Deleted

## 2018-10-28 DIAGNOSIS — I429 Cardiomyopathy, unspecified: Secondary | ICD-10-CM

## 2018-10-28 DIAGNOSIS — R55 Syncope and collapse: Secondary | ICD-10-CM

## 2018-10-28 LAB — CUP PACEART REMOTE DEVICE CHECK
Date Time Interrogation Session: 20200820094034
Implantable Pulse Generator Implant Date: 20180803

## 2018-11-05 NOTE — Progress Notes (Signed)
Carelink Summary Report / Loop Recorder 

## 2018-11-18 ENCOUNTER — Other Ambulatory Visit: Payer: Self-pay | Admitting: Gastroenterology

## 2018-11-25 ENCOUNTER — Other Ambulatory Visit: Payer: Self-pay

## 2018-11-25 ENCOUNTER — Ambulatory Visit (INDEPENDENT_AMBULATORY_CARE_PROVIDER_SITE_OTHER): Payer: Medicare HMO | Admitting: Primary Care

## 2018-11-25 ENCOUNTER — Encounter: Payer: Self-pay | Admitting: Primary Care

## 2018-11-25 VITALS — BP 124/78 | HR 64 | Temp 97.8°F | Ht 62.0 in | Wt 130.5 lb

## 2018-11-25 DIAGNOSIS — F32A Depression, unspecified: Secondary | ICD-10-CM

## 2018-11-25 DIAGNOSIS — Z860100 Personal history of colon polyps, unspecified: Secondary | ICD-10-CM

## 2018-11-25 DIAGNOSIS — F419 Anxiety disorder, unspecified: Secondary | ICD-10-CM

## 2018-11-25 DIAGNOSIS — I429 Cardiomyopathy, unspecified: Secondary | ICD-10-CM | POA: Diagnosis not present

## 2018-11-25 DIAGNOSIS — R519 Headache, unspecified: Secondary | ICD-10-CM

## 2018-11-25 DIAGNOSIS — R51 Headache: Secondary | ICD-10-CM

## 2018-11-25 DIAGNOSIS — G4700 Insomnia, unspecified: Secondary | ICD-10-CM

## 2018-11-25 DIAGNOSIS — E7800 Pure hypercholesterolemia, unspecified: Secondary | ICD-10-CM | POA: Diagnosis not present

## 2018-11-25 DIAGNOSIS — Z0000 Encounter for general adult medical examination without abnormal findings: Secondary | ICD-10-CM

## 2018-11-25 DIAGNOSIS — K2100 Gastro-esophageal reflux disease with esophagitis, without bleeding: Secondary | ICD-10-CM

## 2018-11-25 DIAGNOSIS — M545 Low back pain: Secondary | ICD-10-CM

## 2018-11-25 DIAGNOSIS — G8929 Other chronic pain: Secondary | ICD-10-CM

## 2018-11-25 DIAGNOSIS — F329 Major depressive disorder, single episode, unspecified: Secondary | ICD-10-CM

## 2018-11-25 LAB — CBC
HCT: 37.9 % (ref 36.0–46.0)
Hemoglobin: 12.7 g/dL (ref 12.0–15.0)
MCHC: 33.5 g/dL (ref 30.0–36.0)
MCV: 89.2 fl (ref 78.0–100.0)
Platelets: 267 10*3/uL (ref 150.0–400.0)
RBC: 4.25 Mil/uL (ref 3.87–5.11)
RDW: 13 % (ref 11.5–15.5)
WBC: 6 10*3/uL (ref 4.0–10.5)

## 2018-11-25 LAB — COMPREHENSIVE METABOLIC PANEL
ALT: 16 U/L (ref 0–35)
AST: 21 U/L (ref 0–37)
Albumin: 4.5 g/dL (ref 3.5–5.2)
Alkaline Phosphatase: 65 U/L (ref 39–117)
BUN: 17 mg/dL (ref 6–23)
CO2: 29 mEq/L (ref 19–32)
Calcium: 10 mg/dL (ref 8.4–10.5)
Chloride: 105 mEq/L (ref 96–112)
Creatinine, Ser: 0.71 mg/dL (ref 0.40–1.20)
GFR: 80.4 mL/min (ref >60.00)
Glucose, Bld: 95 mg/dL (ref 70–99)
Potassium: 4.1 mEq/L (ref 3.5–5.1)
Sodium: 142 mEq/L (ref 135–145)
Total Bilirubin: 0.6 mg/dL (ref 0.2–1.2)
Total Protein: 7.1 g/dL (ref 6.0–8.3)

## 2018-11-25 LAB — TSH: TSH: 1.66 u[IU]/mL (ref 0.35–4.50)

## 2018-11-25 LAB — LIPID PANEL
Cholesterol: 128 mg/dL (ref 0–200)
HDL: 54.9 mg/dL (ref >39.00)
LDL Cholesterol: 44 mg/dL (ref 0–99)
NonHDL: 72.74
Total CHOL/HDL Ratio: 2
Triglycerides: 142 mg/dL (ref 0.0–149.0)
VLDL: 28.4 mg/dL (ref 0.0–40.0)

## 2018-11-25 MED ORDER — ZOLPIDEM TARTRATE 10 MG PO TABS: 10.0000 mg | ORAL_TABLET | Freq: Every evening | ORAL | 0 refills | Status: DC | PRN

## 2018-11-25 MED ORDER — TIZANIDINE HCL 2 MG PO TABS: 2.0000 mg | ORAL_TABLET | Freq: Two times a day (BID) | ORAL | 0 refills | Status: DC | PRN

## 2018-11-25 MED ORDER — ROSUVASTATIN CALCIUM 20 MG PO TABS: 20.0000 mg | ORAL_TABLET | Freq: Every day | ORAL | 3 refills | Status: DC

## 2018-11-25 MED ORDER — PROMETHAZINE HCL 12.5 MG PO TABS: 12.5000 mg | ORAL_TABLET | Freq: Two times a day (BID) | ORAL | 0 refills | Status: DC | PRN

## 2018-11-25 MED ORDER — PANTOPRAZOLE SODIUM 40 MG PO TBEC: 40.0000 mg | DELAYED_RELEASE_TABLET | Freq: Two times a day (BID) | ORAL | 1 refills | Status: DC

## 2018-11-25 NOTE — Assessment & Plan Note (Signed)
Doing well on pantoprazole 40 mg BID, did experience breakthrough symptoms when attempting to wean down. She typically follows with GI but doesn't want to follow up due to Covid-19.   Agreed to refill her pantoprazole for 6 months, then she would need to get with GI. She agreed.

## 2018-11-25 NOTE — Assessment & Plan Note (Signed)
Repeat lipid panel pending. Continue Crestor.

## 2018-11-25 NOTE — Assessment & Plan Note (Signed)
Overall stable, taking Tylenol and promethazine as needed which is 1-2 times weekly on average. Mostly taking promethazine due to nausea from stress.   Discussed to use promethazine as needed, sparingly.

## 2018-11-25 NOTE — Assessment & Plan Note (Signed)
Following with cardiology and electrophysiology. Continue beta blocker and BP control.

## 2018-11-25 NOTE — Assessment & Plan Note (Signed)
Doing well on current dose of Ambien, discussed potential side effects and warnings. Refill provided.

## 2018-11-25 NOTE — Assessment & Plan Note (Signed)
Immunizations UTD, influenza vaccination completed today. Mammogram and bone density test UTD. Colonoscopy overdue, she will think about this. Cologuard completed in 2019, negative. Encouraged regular exercise, healthy diet. Exam unremarkable. Labs pending.

## 2018-11-25 NOTE — Assessment & Plan Note (Signed)
Managed on tizanidine for which she uses very sparingly overall, does need refill today. Previously following with orthopedics who provided her with injections in the past.  Agree to refill for now.

## 2018-11-25 NOTE — Patient Instructions (Signed)
Stop by the lab prior to leaving today. I will notify you of your results once received.   Start exercising. You should be getting 150 minutes of exercise weekly.  Continue to work on a healthy diet. Ensure you are consuming 64 ounces of water daily.  It was a pleasure to see you today!   Preventive Care 74 Years and Older, Female Preventive care refers to lifestyle choices and visits with your health care provider that can promote health and wellness. This includes:  A yearly physical exam. This is also called an annual well check.  Regular dental and eye exams.  Immunizations.  Screening for certain conditions.  Healthy lifestyle choices, such as diet and exercise. What can I expect for my preventive care visit? Physical exam Your health care provider will check:  Height and weight. These may be used to calculate body mass index (BMI), which is a measurement that tells if you are at a healthy weight.  Heart rate and blood pressure.  Your skin for abnormal spots. Counseling Your health care provider may ask you questions about:  Alcohol, tobacco, and drug use.  Emotional well-being.  Home and relationship well-being.  Sexual activity.  Eating habits.  History of falls.  Memory and ability to understand (cognition).  Work and work Statistician.  Pregnancy and menstrual history. What immunizations do I need?  Influenza (flu) vaccine  This is recommended every year. Tetanus, diphtheria, and pertussis (Tdap) vaccine  You may need a Td booster every 10 years. Varicella (chickenpox) vaccine  You may need this vaccine if you have not already been vaccinated. Zoster (shingles) vaccine  You may need this after age 74. Pneumococcal conjugate (PCV13) vaccine  One dose is recommended after age 74. Pneumococcal polysaccharide (PPSV23) vaccine  One dose is recommended after age 74. Measles, mumps, and rubella (MMR) vaccine  You may need at least one dose of  MMR if you were born in 1957 or later. You may also need a second dose. Meningococcal conjugate (MenACWY) vaccine  You may need this if you have certain conditions. Hepatitis A vaccine  You may need this if you have certain conditions or if you travel or work in places where you may be exposed to hepatitis A. Hepatitis B vaccine  You may need this if you have certain conditions or if you travel or work in places where you may be exposed to hepatitis B. Haemophilus influenzae type b (Hib) vaccine  You may need this if you have certain conditions. You may receive vaccines as individual doses or as more than one vaccine together in one shot (combination vaccines). Talk with your health care provider about the risks and benefits of combination vaccines. What tests do I need? Blood tests  Lipid and cholesterol levels. These may be checked every 5 years, or more frequently depending on your overall health.  Hepatitis C test.  Hepatitis B test. Screening  Lung cancer screening. You may have this screening every year starting at age 74 if you have a 30-pack-year history of smoking and currently smoke or have quit within the past 15 years.  Colorectal cancer screening. All adults should have this screening starting at age 74 and continuing until age 25. Your health care provider may recommend screening at age 74 if you are at increased risk. You will have tests every 1-10 years, depending on your results and the type of screening test.  Diabetes screening. This is done by checking your blood sugar (glucose) after you have  not eaten for a while (fasting). You may have this done every 1-3 years.  Mammogram. This may be done every 1-2 years. Talk with your health care provider about how often you should have regular mammograms.  BRCA-related cancer screening. This may be done if you have a family history of breast, ovarian, tubal, or peritoneal cancers. Other tests  Sexually transmitted  disease (STD) testing.  Bone density scan. This is done to screen for osteoporosis. You may have this done starting at age 74. Follow these instructions at home: Eating and drinking  Eat a diet that includes fresh fruits and vegetables, whole grains, lean protein, and low-fat dairy products. Limit your intake of foods with high amounts of sugar, saturated fats, and salt.  Take vitamin and mineral supplements as recommended by your health care provider.  Do not drink alcohol if your health care provider tells you not to drink.  If you drink alcohol: ? Limit how much you have to 0-1 drink a day. ? Be aware of how much alcohol is in your drink. In the [place] one drink equals one 12 oz bottle of beer (355 mL), one 5 oz glass of wine (148 mL), or one 1 oz glass of hard liquor (44 mL). Lifestyle  Take daily care of your teeth and gums.  Stay active. Exercise for at least 30 minutes on 5 or more days each week.  Do not use any products that contain nicotine or tobacco, such as cigarettes, e-cigarettes, and chewing tobacco. If you need help quitting, ask your health care provider.  If you are sexually active, practice safe sex. Use a condom or other form of protection in order to prevent STIs (sexually transmitted infections).  Talk with your health care provider about taking a low-dose aspirin or statin. What's next?  Go to your health care provider once a year for a well check visit.  Ask your health care provider how often you should have your eyes and teeth checked.  Stay up to date on all vaccines. This information is not intended to replace advice given to you by your health care provider. Make sure you discuss any questions you have with your health care provider. Document Released: 03/23/2015 Document Revised: 02/18/2018 Document Reviewed: 02/18/2018 Elsevier Patient Education  2020 [employer].

## 2018-11-25 NOTE — Assessment & Plan Note (Addendum)
Overdue for colonoscopy, was due in 2018. We discussed the need for repeat colonoscopy, she kindly declines due to Covid-19 and will think about for next year.   Completed Cologuard in 2019, negative

## 2018-11-25 NOTE — Assessment & Plan Note (Signed)
Increased anxiety due to Covid-19, overall able to manage well on her own. Denies Si/HI. Continue Lexapro 20 mg. She will update if symptoms become intolerable.

## 2018-11-25 NOTE — Progress Notes (Signed)
Subjective:    Patient ID: [name], female    DOB: Aug 20, 1945, 73 y.o.   MRN: [id number]  HPI  [name] is a 73 year old female who presents today for complete physical.  Immunizations: -Influenza: Due today -Pneumonia: Completed last in 2017 -Shingles: Completed Shingrix in 2018 and 2019  Diet: She is eating a healthy diet. Eating mostly beans, lean protein, fruit. Recently started take out food. Desserts 1-3 days weekly. She is drinking water, wine, occasionally diet soda. Exercise: She is walking some, no regular exercise.   Eye exam: Completed in 2019 Dental exam: Due in November  Colonoscopy: Completed in 2013, due in 2018 and she continues to decline. Completed Cologuard in 2019, negative Mammogram: Completed in August 2020 Dexa: Completed in 2019, osteopenia. Taking vitamin D. Hep C Screen: Negative in 2019  BP Readings from Last 3 Encounters:  11/25/18 124/78  05/11/18 128/74  04/05/18 122/76     Review of Systems  Constitutional: Negative for unexpected weight change.  HENT: Negative for rhinorrhea.   Respiratory: Negative for cough and shortness of breath.   Cardiovascular: Negative for chest pain.  Gastrointestinal: Negative for constipation and diarrhea.  Genitourinary: Negative for difficulty urinating.  Musculoskeletal: Positive for arthralgias and myalgias.  Skin: Negative for rash.  Allergic/Immunologic: Negative for environmental allergies.  Neurological: Negative for dizziness, numbness and headaches.  Psychiatric/Behavioral:       Overall feels well managed on Lexapro        Past Medical History:  Diagnosis Date  . Cardiomyopathy    nonischemic  . Chronic back pain   . Complication of anesthesia   . Erosive esophagitis    LA Classification Grade B  . Essential hypertension   . GERD (gastroesophageal reflux disease)   . Hiatal hernia   . Hyperlipidemia   . Insomnia   . MDD (major depressive disorder)   . Melanoma ([place])    basil  cell, pt reports no melanoma  . Migraine   . Osteopenia   . PONV (postoperative nausea and vomiting)   . Rosacea   . Sessile colonic polyp 10/2011   adenoma  . Squamous carcinoma    scalp     Social History   Socioeconomic History  . Marital status: Widowed    Spouse name: Not on file  . Number of children: 3  . Years of education: 61  . Highest education level: Not on file  Occupational History  . Not on file  Social Needs  . Financial resource strain: Not on file  . Food insecurity    Worry: Not on file    Inability: Not on file  . Transportation needs    Medical: Not on file    Non-medical: Not on file  Tobacco Use  . Smoking status: Former Smoker    Quit date: 06/03/1981    Years since quitting: 37.5  . Smokeless tobacco: Never Used  Substance and Sexual Activity  . Alcohol use: Yes    Alcohol/week: 3.0 standard drinks    Types: 3 Glasses of wine per week    Comment: Wine per night  . Drug use: No  . Sexual activity: Not on file  Lifestyle  . Physical activity    Days per week: Not on file    Minutes per session: Not on file  . Stress: Not on file  Relationships  . Social Herbalist on phone: Not on file    Gets together: Not on file  Attends religious service: Not on file    Active member of club or organization: Not on file    Attends meetings of clubs or organizations: Not on file    Relationship status: Not on file  . Intimate partner violence    Fear of current or ex partner: Not on file    Emotionally abused: Not on file    Physically abused: Not on file    Forced sexual activity: Not on file  Other Topics Concern  . Not on file  Social History Narrative   Lives alone in two-story home.     Her husband passed away unexpectedly.     They have three children- one is a pediatrician.   Desires CPR.   Caffeine use: Decaf coffee   Right handed    Past Surgical History:  Procedure Laterality Date  . APPENDECTOMY    . CARDIAC  CATHETERIZATION    . CATARACT EXTRACTION W/PHACO Right 03/18/2016   Procedure: CATARACT EXTRACTION PHACO AND INTRAOCULAR LENS PLACEMENT (IOC);  Surgeon: Birder [name], MD;  Location: [facility];  Service: Ophthalmology;  Laterality: Right;  [place] 45.3 AP% 22.2 CDE 10.07 Fluid pack lot # [id number]  . CATARACT EXTRACTION W/PHACO Left 04/08/2016   Procedure: CATARACT EXTRACTION PHACO AND INTRAOCULAR LENS PLACEMENT (IOC);  Surgeon: Birder [name], MD;  Location: [facility];  Service: Ophthalmology;  Laterality: Left;  [place] 00:41 AP% 15.0 CDE 6.27 Fluid pack lot # [id number]  . LOOP RECORDER INSERTION N/A 10/10/2016   Procedure: Loop Recorder Insertion;  Surgeon: Evans Lance, MD;  Location: [facility];  Service: Cardiovascular;  Laterality: N/A;  . SKIN CANCER EXCISION     scalp  . TUBAL LIGATION    . VAGINAL HYSTERECTOMY      Family History  Problem Relation Age of Onset  . Alzheimer's disease Mother   . Heart disease Father   . Arthritis/Rheumatoid Sister   . Colon cancer Neg Hx   . Stomach cancer Neg Hx   . Rectal cancer Neg Hx   . Esophageal cancer Neg Hx     Allergies  Allergen Reactions  . Penicillin G Swelling    Rash,  . Penicillins Anaphylaxis and Hives    Has patient had a PCN reaction causing immediate rash, facial/tongue/throat swelling, SOB or lightheadedness with hypotension: {Yes Has patient had a PCN reaction causing severe rash involving mucus membranes or skin necrosis: {no Has patient had a PCN reaction that required hospitalization {yes Has patient had a PCN reaction occurring within the last 10 years: {no If all of the above answers are "NO", then may proceed with Cephalosporin use.  . Clindamycin Itching    rash  . Lansoprazole Other (See Comments)    REACTION: headaches  . Lincomycin Itching    rash  . Sulfa Antibiotics Hives  . Sulfasalazine Hives  . Cefaclor Itching and Rash    rash  . Cefuroxime Axetil Rash and Itching    rash  .  Clindamycin/Lincomycin Itching and Rash  . Latex Itching and Rash    rash  . Lincomycin Hcl Itching and Rash  . Sulfamethoxazole-Trimethoprim Rash    Current Outpatient Medications on File Prior to Visit  Medication Sig Dispense Refill  . acetaminophen (TYLENOL) 500 MG tablet Take 1,000 mg by mouth every 6 (six) hours as needed for moderate pain or headache.    Marland Kitchen amLODipine (NORVASC) 2.5 MG tablet Take 1 tablet (2.5 mg total) by mouth daily. 90 tablet 3  . carvedilol (COREG)  12.5 MG tablet TAKE 1 TABLET BY MOUTH TWICE DAILY WITH A MEAL KEEP APPT FOR FURTHER REFILLS 180 tablet 2  . Cholecalciferol (VITAMIN D3) 2000 units capsule Take 2,000 Units by mouth daily.    . enalapril (VASOTEC) 10 MG tablet Take 1 tablet (10 mg total) by mouth daily. 90 tablet 1  . escitalopram (LEXAPRO) 20 MG tablet TAKE 1 TABLET BY MOUTH DAILY 90 tablet 3  . metroNIDAZOLE (METROCREAM) 0.75 % cream Apply 1 application topically once a week.     . Multiple Vitamin (MULTIVITAMIN) capsule Take 1 capsule by mouth daily.      . pantoprazole (PROTONIX) 40 MG tablet TAKE 1 TABLET BY MOUTH TWICE DAILY 60 tablet 0  . Polyvinyl Alcohol-Povidone (REFRESH OP) Apply to eye.    . promethazine (PHENERGAN) 12.5 MG tablet TAKE ONE TABLET EVERY EIGHT HOURS IF NEEDED FOR NAUSEA AND VOMITING 20 tablet 0  . rosuvastatin (CRESTOR) 20 MG tablet TAKE ONE TABLET (20 MG) BY MOUTH AT BEDTIME FOR CHOLESTEROL 90 tablet 1  . tiZANidine (ZANAFLEX) 2 MG tablet Take 2 mg by mouth 2 (two) times daily as needed for muscle spasms.     Marland Kitchen zolpidem (AMBIEN) 10 MG tablet TAKE 1 TABLET BY MOUTH AT BEDTIME AS NEEDED FOR SLEEP 90 tablet 0  . nitroGLYCERIN (NITROSTAT) 0.4 MG SL tablet Place 1 tablet (0.4 mg total) under the tongue every 5 (five) minutes as needed for chest pain. (Patient not taking: Reported on 08/13/2018) 30 tablet 3   No current facility-administered medications on file prior to visit.     BP 124/78   Pulse 64   Temp 97.8 F (36.6 C)  (Temporal)   Ht 5\' 2"  (1.575 m)   Wt 130 lb 8 oz (59.2 kg)   SpO2 97%   BMI 23.87 kg/m    Objective:   Physical Exam  Constitutional: She is oriented to person, place, and time. She appears well-nourished.  HENT:  Right Ear: Tympanic membrane and ear canal normal.  Left Ear: Tympanic membrane and ear canal normal.  Mouth/Throat: Oropharynx is clear and moist.  Eyes: Pupils are equal, round, and reactive to light. EOM are normal.  Neck: Neck supple.  Cardiovascular: Normal rate and regular rhythm.  Respiratory: Effort normal and breath sounds normal.  GI: Soft. Bowel sounds are normal. There is no abdominal tenderness.  Musculoskeletal: Normal range of motion.  Neurological: She is alert and oriented to person, place, and time.  Skin: Skin is warm and dry.  Psychiatric: She has a normal mood and affect.           Assessment & Plan:

## 2018-11-26 ENCOUNTER — Telehealth: Payer: Self-pay | Admitting: Primary Care

## 2018-11-26 NOTE — Telephone Encounter (Signed)
Patient is requesting a print out of her lab results. She would like this sent to her in the mail once printed

## 2018-11-30 ENCOUNTER — Ambulatory Visit (INDEPENDENT_AMBULATORY_CARE_PROVIDER_SITE_OTHER): Payer: Medicare HMO | Admitting: *Deleted

## 2018-11-30 DIAGNOSIS — R55 Syncope and collapse: Secondary | ICD-10-CM | POA: Diagnosis not present

## 2018-11-30 DIAGNOSIS — I429 Cardiomyopathy, unspecified: Secondary | ICD-10-CM

## 2018-11-30 LAB — CUP PACEART REMOTE DEVICE CHECK
Date Time Interrogation Session: 20200922100628
Implantable Pulse Generator Implant Date: 20180803

## 2018-12-03 ENCOUNTER — Encounter: Payer: Self-pay | Admitting: *Deleted

## 2018-12-03 NOTE — Telephone Encounter (Signed)
This has been printed and mail out al ready.

## 2018-12-09 ENCOUNTER — Ambulatory Visit: Payer: Medicare HMO | Admitting: Gastroenterology

## 2018-12-09 NOTE — Progress Notes (Signed)
Carelink Summary Report / Loop Recorder 

## 2019-01-02 LAB — CUP PACEART REMOTE DEVICE CHECK
Date Time Interrogation Session: 20201025100720
Implantable Pulse Generator Implant Date: 20180803

## 2019-01-03 ENCOUNTER — Ambulatory Visit (INDEPENDENT_AMBULATORY_CARE_PROVIDER_SITE_OTHER): Payer: Medicare HMO | Admitting: *Deleted

## 2019-01-03 DIAGNOSIS — R55 Syncope and collapse: Secondary | ICD-10-CM

## 2019-01-03 DIAGNOSIS — I429 Cardiomyopathy, unspecified: Secondary | ICD-10-CM | POA: Diagnosis not present

## 2019-01-21 NOTE — Progress Notes (Signed)
Carelink Summary Report / Loop Recorder 

## 2019-02-04 LAB — CUP PACEART REMOTE DEVICE CHECK
Date Time Interrogation Session: 20201127090157
Implantable Pulse Generator Implant Date: 20180803

## 2019-02-07 ENCOUNTER — Ambulatory Visit (INDEPENDENT_AMBULATORY_CARE_PROVIDER_SITE_OTHER): Payer: Medicare HMO | Admitting: *Deleted

## 2019-02-07 DIAGNOSIS — R55 Syncope and collapse: Secondary | ICD-10-CM

## 2019-02-16 ENCOUNTER — Other Ambulatory Visit: Payer: Self-pay | Admitting: Internal Medicine

## 2019-03-02 NOTE — Progress Notes (Signed)
ILR remote 

## 2019-03-10 ENCOUNTER — Ambulatory Visit (INDEPENDENT_AMBULATORY_CARE_PROVIDER_SITE_OTHER): Payer: Medicare HMO | Admitting: *Deleted

## 2019-03-10 DIAGNOSIS — R55 Syncope and collapse: Secondary | ICD-10-CM

## 2019-03-10 LAB — CUP PACEART REMOTE DEVICE CHECK
Date Time Interrogation Session: 20201230230656
Implantable Pulse Generator Implant Date: 20180803

## 2019-03-11 NOTE — Progress Notes (Signed)
ILR remote 

## 2019-03-25 ENCOUNTER — Other Ambulatory Visit: Payer: Self-pay | Admitting: Internal Medicine

## 2019-03-25 ENCOUNTER — Other Ambulatory Visit: Payer: Self-pay | Admitting: Primary Care

## 2019-03-25 DIAGNOSIS — K2100 Gastro-esophageal reflux disease with esophagitis, without bleeding: Secondary | ICD-10-CM

## 2019-03-29 ENCOUNTER — Ambulatory Visit: Payer: Medicare HMO | Attending: Internal Medicine

## 2019-03-29 DIAGNOSIS — Z23 Encounter for immunization: Secondary | ICD-10-CM

## 2019-03-29 NOTE — Progress Notes (Signed)
   Covid-19 Vaccination Clinic  Name:  [name]    MRN: [id number] DOB: [birth year]  03/29/2019  [name] was observed post Covid-19 immunization for 30 minutes based on pre-vaccination screening without incidence. She was provided with Vaccine Information Sheet and instruction to access the V-Safe system.   [name] was instructed to call 911 with any severe reactions post vaccine: Marland Kitchen Difficulty breathing  . Swelling of your face and throat  . A fast heartbeat  . A bad rash all over your body  . Dizziness and weakness    Immunizations Administered    Name Date Dose VIS Date Route   Pfizer COVID-19 Vaccine 03/29/2019  1:48 PM 0.3 mL 02/18/2019 Intramuscular   Manufacturer: [employer]   Lot: [id number]   [place]: [id number]

## 2019-03-31 DIAGNOSIS — H47239 Glaucomatous optic atrophy, unspecified eye: Secondary | ICD-10-CM | POA: Diagnosis not present

## 2019-03-31 DIAGNOSIS — Z0100 Encounter for examination of eyes and vision without abnormal findings: Secondary | ICD-10-CM | POA: Diagnosis not present

## 2019-03-31 DIAGNOSIS — H524 Presbyopia: Secondary | ICD-10-CM | POA: Diagnosis not present

## 2019-04-05 ENCOUNTER — Other Ambulatory Visit: Payer: Self-pay | Admitting: Primary Care

## 2019-04-05 DIAGNOSIS — F331 Major depressive disorder, recurrent, moderate: Secondary | ICD-10-CM

## 2019-04-05 DIAGNOSIS — G4700 Insomnia, unspecified: Secondary | ICD-10-CM

## 2019-04-05 NOTE — Telephone Encounter (Signed)
zolpidem (AMBIEN) 10 MG tablet Last prescribed on 11/25/2018 #90 with 0 refills . Last appointment on 11/25/2018. No future appointment

## 2019-04-05 NOTE — Telephone Encounter (Signed)
Noted, refills sent to pharmacy. 

## 2019-04-11 ENCOUNTER — Ambulatory Visit (INDEPENDENT_AMBULATORY_CARE_PROVIDER_SITE_OTHER): Payer: Medicare HMO | Admitting: *Deleted

## 2019-04-11 DIAGNOSIS — R55 Syncope and collapse: Secondary | ICD-10-CM | POA: Diagnosis not present

## 2019-04-11 LAB — CUP PACEART REMOTE DEVICE CHECK
Date Time Interrogation Session: 20210201002727
Implantable Pulse Generator Implant Date: 20180803

## 2019-04-11 NOTE — Progress Notes (Signed)
ILR Remote 

## 2019-04-14 ENCOUNTER — Encounter: Payer: Self-pay | Admitting: Primary Care

## 2019-04-14 DIAGNOSIS — R928 Other abnormal and inconclusive findings on diagnostic imaging of breast: Secondary | ICD-10-CM | POA: Diagnosis not present

## 2019-04-14 DIAGNOSIS — R921 Mammographic calcification found on diagnostic imaging of breast: Secondary | ICD-10-CM | POA: Diagnosis not present

## 2019-04-18 ENCOUNTER — Ambulatory Visit: Payer: Medicare HMO | Attending: Internal Medicine

## 2019-04-18 DIAGNOSIS — Z23 Encounter for immunization: Secondary | ICD-10-CM

## 2019-04-18 NOTE — Progress Notes (Signed)
   Covid-19 Vaccination Clinic  Name:  [name]    MRN: [id number] DOB: 07-10-1945  04/18/2019  [name] was observed post Covid-19 immunization for 15 minutes without incidence. She was provided with Vaccine Information Sheet and instruction to access the V-Safe system.   [name] was instructed to call 911 with any severe reactions post vaccine: Marland Kitchen Difficulty breathing  . Swelling of your face and throat  . A fast heartbeat  . A bad rash all over your body  . Dizziness and weakness    Immunizations Administered    Name Date Dose VIS Date Route   Pfizer COVID-19 Vaccine 04/18/2019  2:06 PM 0.3 mL 02/18/2019 Intramuscular   Manufacturer: [place]   Lot: [id number]   [place]: [id number]

## 2019-04-20 DIAGNOSIS — L853 Xerosis cutis: Secondary | ICD-10-CM | POA: Diagnosis not present

## 2019-04-20 DIAGNOSIS — D2261 Melanocytic nevi of right upper limb, including shoulder: Secondary | ICD-10-CM | POA: Diagnosis not present

## 2019-04-20 DIAGNOSIS — X32XXXA Exposure to sunlight, initial encounter: Secondary | ICD-10-CM | POA: Diagnosis not present

## 2019-04-20 DIAGNOSIS — D2262 Melanocytic nevi of left upper limb, including shoulder: Secondary | ICD-10-CM | POA: Diagnosis not present

## 2019-04-20 DIAGNOSIS — D2271 Melanocytic nevi of right lower limb, including hip: Secondary | ICD-10-CM | POA: Diagnosis not present

## 2019-04-20 DIAGNOSIS — L570 Actinic keratosis: Secondary | ICD-10-CM | POA: Diagnosis not present

## 2019-04-20 DIAGNOSIS — Z85828 Personal history of other malignant neoplasm of skin: Secondary | ICD-10-CM | POA: Diagnosis not present

## 2019-04-20 DIAGNOSIS — Z1283 Encounter for screening for malignant neoplasm of skin: Secondary | ICD-10-CM | POA: Diagnosis not present

## 2019-04-21 DIAGNOSIS — M79641 Pain in right hand: Secondary | ICD-10-CM | POA: Insufficient documentation

## 2019-04-25 ENCOUNTER — Other Ambulatory Visit: Payer: Self-pay | Admitting: Primary Care

## 2019-04-25 DIAGNOSIS — R519 Headache, unspecified: Secondary | ICD-10-CM

## 2019-04-25 NOTE — Telephone Encounter (Signed)
Refill sent to pharmacy.   

## 2019-04-25 NOTE — Telephone Encounter (Signed)
Last prescribed on 11/25/2018. Last appointment on 11/25/2018. No future appointment

## 2019-05-02 DIAGNOSIS — G5601 Carpal tunnel syndrome, right upper limb: Secondary | ICD-10-CM | POA: Diagnosis not present

## 2019-05-05 DIAGNOSIS — M79641 Pain in right hand: Secondary | ICD-10-CM | POA: Diagnosis not present

## 2019-05-05 DIAGNOSIS — G5601 Carpal tunnel syndrome, right upper limb: Secondary | ICD-10-CM | POA: Diagnosis not present

## 2019-05-12 ENCOUNTER — Encounter: Payer: Medicare HMO | Admitting: Internal Medicine

## 2019-05-12 ENCOUNTER — Ambulatory Visit (INDEPENDENT_AMBULATORY_CARE_PROVIDER_SITE_OTHER): Payer: Medicare HMO | Admitting: *Deleted

## 2019-05-12 DIAGNOSIS — R55 Syncope and collapse: Secondary | ICD-10-CM

## 2019-05-12 LAB — CUP PACEART REMOTE DEVICE CHECK
Date Time Interrogation Session: 20210304030856
Implantable Pulse Generator Implant Date: 20180803

## 2019-05-12 NOTE — Progress Notes (Signed)
ILR Remote 

## 2019-05-14 LAB — CUP PACEART REMOTE DEVICE CHECK
Date Time Interrogation Session: 20210306000500
Implantable Pulse Generator Implant Date: 20180803

## 2019-05-16 ENCOUNTER — Telehealth: Payer: Self-pay | Admitting: Emergency Medicine

## 2019-05-16 NOTE — Telephone Encounter (Signed)
Pt returned phone call.  She was not aware of event and denies any cardiac symptoms.  Pt confirmed that she is taking Carvedilol 12.5 mg BID.  Pt has appt upcoming on 3/22 with Dr. Lovena Le.  Advised will forward info to Dr. Lovena Le, anticipate no changes currently but if there are any changes we will call back, otherwise continue current meds and keep appt on 3/22.

## 2019-05-16 NOTE — Telephone Encounter (Signed)
LMOM to call device clinic and # provided.  To assess for symptoms related to episode of SVT recorded by [facility].

## 2019-05-17 DIAGNOSIS — H02051 Trichiasis without entropian right upper eyelid: Secondary | ICD-10-CM | POA: Diagnosis not present

## 2019-05-19 NOTE — Telephone Encounter (Signed)
Continue coreg. Followup as scheduled. GT

## 2019-05-25 ENCOUNTER — Other Ambulatory Visit: Payer: Self-pay | Admitting: Internal Medicine

## 2019-05-25 DIAGNOSIS — H04123 Dry eye syndrome of bilateral lacrimal glands: Secondary | ICD-10-CM | POA: Diagnosis not present

## 2019-05-30 ENCOUNTER — Ambulatory Visit (INDEPENDENT_AMBULATORY_CARE_PROVIDER_SITE_OTHER): Payer: Medicare HMO | Admitting: Internal Medicine

## 2019-05-30 ENCOUNTER — Other Ambulatory Visit: Payer: Self-pay

## 2019-05-30 ENCOUNTER — Encounter: Payer: Self-pay | Admitting: Internal Medicine

## 2019-05-30 VITALS — BP 134/78 | HR 68 | Resp 15 | Ht 62.0 in | Wt 137.0 lb

## 2019-05-30 DIAGNOSIS — I472 Ventricular tachycardia: Secondary | ICD-10-CM

## 2019-05-30 DIAGNOSIS — R0602 Shortness of breath: Secondary | ICD-10-CM | POA: Diagnosis not present

## 2019-05-30 DIAGNOSIS — R55 Syncope and collapse: Secondary | ICD-10-CM

## 2019-05-30 DIAGNOSIS — I429 Cardiomyopathy, unspecified: Secondary | ICD-10-CM

## 2019-05-30 DIAGNOSIS — E7800 Pure hypercholesterolemia, unspecified: Secondary | ICD-10-CM | POA: Diagnosis not present

## 2019-05-30 DIAGNOSIS — I4729 Other ventricular tachycardia: Secondary | ICD-10-CM

## 2019-05-30 LAB — CUP PACEART INCLINIC DEVICE CHECK
Date Time Interrogation Session: 20210322163830
Implantable Pulse Generator Implant Date: 20180803

## 2019-05-30 MED ORDER — CARVEDILOL 12.5 MG PO TABS: 12.5000 mg | ORAL_TABLET | Freq: Two times a day (BID) | ORAL | 3 refills | Status: DC

## 2019-05-30 MED ORDER — ENALAPRIL MALEATE 10 MG PO TABS: 10.0000 mg | ORAL_TABLET | Freq: Every day | ORAL | 3 refills | Status: DC

## 2019-05-30 MED ORDER — ROSUVASTATIN CALCIUM 20 MG PO TABS: 20.0000 mg | ORAL_TABLET | Freq: Every day | ORAL | 3 refills | Status: DC

## 2019-05-30 MED ORDER — AMLODIPINE BESYLATE 2.5 MG PO TABS: 2.5000 mg | ORAL_TABLET | Freq: Every day | ORAL | 3 refills | Status: DC

## 2019-05-30 MED ORDER — NITROGLYCERIN 0.4 MG SL SUBL: 0.4000 mg | SUBLINGUAL_TABLET | SUBLINGUAL | 3 refills | Status: DC | PRN

## 2019-05-30 NOTE — Patient Instructions (Addendum)
Medication Instructions:  Your physician recommends that you continue on your current medications as directed. Please refer to the Current Medication list given to you today.  Labwork: None ordered.  Testing/Procedures: Your physician has requested that you have an echocardiogram. Echocardiography is a painless test that uses sound waves to create images of your heart. It provides your doctor with information about the size and shape of your heart and how well your heart's chambers and valves are working. This procedure takes approximately one hour. There are no restrictions for this procedure.  Please schedule for ECHO  Follow-Up: Your physician wants you to follow-up in:  Based on results of ECHO  Remote monthly monitoring is used to monitor your loop recorder.  Any Other Special Instructions Will Be Listed Below (If Applicable).  If you need a refill on your cardiac medications before your next appointment, please call your pharmacy.

## 2019-05-30 NOTE — Progress Notes (Signed)
HPI [name] returns today for followup.She is a pleasant 74 yo woman with a h/o unexplained syncope, s/p ILR, who also has LBBB. She has been noted to have NSVT on her ILR. She was asymptomatic. She has noted worsening CHF symptoms over the last week but she admits to dietary indiscretion. She has not had syncope. No chest pain or edema.  Allergies  Allergen Reactions  . Penicillin G Swelling    Rash,  . Penicillins Anaphylaxis and Hives    Has patient had a PCN reaction causing immediate rash, facial/tongue/throat swelling, SOB or lightheadedness with hypotension: {Yes Has patient had a PCN reaction causing severe rash involving mucus membranes or skin necrosis: {no Has patient had a PCN reaction that required hospitalization {yes Has patient had a PCN reaction occurring within the last 10 years: {no If all of the above answers are "NO", then may proceed with Cephalosporin use.  . Clindamycin Itching    rash  . Lansoprazole Other (See Comments)    REACTION: headaches  . Lincomycin Itching    rash  . Sulfa Antibiotics Hives  . Sulfasalazine Hives  . Cefaclor Itching and Rash    rash  . Cefuroxime Axetil Rash and Itching    rash  . Clindamycin/Lincomycin Itching and Rash  . Latex Itching and Rash    rash  . Lincomycin Hcl Itching and Rash  . Sulfamethoxazole-Trimethoprim Rash     Current Outpatient Medications  Medication Sig Dispense Refill  . acetaminophen (TYLENOL) 500 MG tablet Take 1,000 mg by mouth every 6 (six) hours as needed for moderate pain or headache.    Marland Kitchen amLODipine (NORVASC) 2.5 MG tablet Take 1 tablet (2.5 mg total) by mouth daily. 90 tablet 3  . carvedilol (COREG) 12.5 MG tablet Take 1 tablet (12.5 mg total) by mouth 2 (two) times daily with a meal. TAKE ONE (1) TABLET BY MOUTH TWO TIMES PER DAY WITH A MEAL 180 tablet 3  . Cholecalciferol (VITAMIN D3) 2000 units capsule Take 2,000 Units by mouth daily.    . enalapril (VASOTEC) 10 MG tablet Take 1  tablet (10 mg total) by mouth daily. 90 tablet 3  . escitalopram (LEXAPRO) 20 MG tablet Take 1 tablet (20 mg total) by mouth daily. For anxiety and depression. 90 tablet 2  . metroNIDAZOLE (METROCREAM) 0.75 % cream Apply 1 application topically once a week.     . Multiple Vitamin (MULTIVITAMIN) capsule Take 1 capsule by mouth daily.      . nitroGLYCERIN (NITROSTAT) 0.4 MG SL tablet Place 1 tablet (0.4 mg total) under the tongue every 5 (five) minutes as needed for chest pain. 30 tablet 3  . pantoprazole (PROTONIX) 40 MG tablet Take 1 tablet (40 mg total) by mouth 2 (two) times daily. For heartburn 180 tablet 1  . Polyvinyl Alcohol-Povidone (REFRESH OP) Apply to eye.    . promethazine (PHENERGAN) 12.5 MG tablet TAKE ONE TABLET TWICE DAILY AS NEEDED FOR NAUSEA / VOMITING 20 tablet 0  . rosuvastatin (CRESTOR) 20 MG tablet Take 1 tablet (20 mg total) by mouth daily. For cholesterol. 90 tablet 3  . tiZANidine (ZANAFLEX) 2 MG tablet Take 1 tablet (2 mg total) by mouth 2 (two) times daily as needed for muscle spasms. 30 tablet 0  . zolpidem (AMBIEN) 10 MG tablet TAKE ONE TABLET AT BEDTIME AS NEEDED FORSLEEP 90 tablet 0   No current facility-administered medications for this visit.     Past Medical History:  Diagnosis Date  .  Cardiomyopathy    nonischemic  . Chronic back pain   . Complication of anesthesia   . Erosive esophagitis    LA Classification Grade B  . Essential hypertension   . GERD (gastroesophageal reflux disease)   . Hiatal hernia   . Hyperlipidemia   . Insomnia   . MDD (major depressive disorder)   . Melanoma ([place])    basil cell, pt reports no melanoma  . Migraine   . Osteopenia   . PONV (postoperative nausea and vomiting)   . Rosacea   . Sessile colonic polyp 10/2011   adenoma  . Squamous carcinoma    scalp    ROS:   All systems reviewed and negative except as noted in the HPI.   Past Surgical History:  Procedure Laterality Date  . APPENDECTOMY    . CARDIAC  CATHETERIZATION    . CATARACT EXTRACTION W/PHACO Right 03/18/2016   Procedure: CATARACT EXTRACTION PHACO AND INTRAOCULAR LENS PLACEMENT (IOC);  Surgeon: Birder [name], MD;  Location: [facility] ORS;  Service: Ophthalmology;  Laterality: Right;  [place] 45.3 AP% 22.2 CDE 10.07 Fluid pack lot # [id number]  . CATARACT EXTRACTION W/PHACO Left 04/08/2016   Procedure: CATARACT EXTRACTION PHACO AND INTRAOCULAR LENS PLACEMENT (IOC);  Surgeon: Birder [name], MD;  Location: [facility] ORS;  Service: Ophthalmology;  Laterality: Left;  [place] 00:41 AP% 15.0 CDE 6.27 Fluid pack lot # [id number]  . LOOP RECORDER INSERTION N/A 10/10/2016   Procedure: Loop Recorder Insertion;  Surgeon: Evans Lance, MD;  Location: [facility];  Service: Cardiovascular;  Laterality: N/A;  . SKIN CANCER EXCISION     scalp  . TUBAL LIGATION    . VAGINAL HYSTERECTOMY       Family History  Problem Relation Age of Onset  . Alzheimer's disease Mother   . Heart disease Father   . Arthritis/Rheumatoid Sister   . Colon cancer Neg Hx   . Stomach cancer Neg Hx   . Rectal cancer Neg Hx   . Esophageal cancer Neg Hx      Social History   Socioeconomic History  . Marital status: Widowed    Spouse name: Not on file  . Number of children: 3  . Years of education: 45  . Highest education level: Not on file  Occupational History  . Not on file  Tobacco Use  . Smoking status: Former Smoker    Quit date: 06/03/1981    Years since quitting: 38.0  . Smokeless tobacco: Never Used  Substance and Sexual Activity  . Alcohol use: Yes    Alcohol/week: 3.0 standard drinks    Types: 3 Glasses of wine per week    Comment: Wine per night  . Drug use: No  . Sexual activity: Not on file  Other Topics Concern  . Not on file  Social History Narrative   Lives alone in two-story home.     Her husband passed away unexpectedly.     They have three children- one is a pediatrician.   Desires CPR.   Caffeine use: Decaf coffee   Right handed    Social Determinants of Health   Financial Resource Strain:   . Difficulty of Paying Living Expenses:   Food Insecurity:   . Worried About Charity fundraiser in the Last Year:   . Arboriculturist in the Last Year:   Transportation Needs:   . Film/video editor (Medical):   Marland Kitchen Lack of Transportation (Non-Medical):   Physical Activity:   .  Days of Exercise per Week:   . Minutes of Exercise per Session:   Stress:   . Feeling of Stress :   Social Connections:   . Frequency of Communication with Friends and Family:   . Frequency of Social Gatherings with Friends and Family:   . Attends Religious Services:   . Active Member of Clubs or Organizations:   . Attends Archivist Meetings:   Marland Kitchen Marital Status:   Intimate Partner Violence:   . Fear of Current or Ex-Partner:   . Emotionally Abused:   Marland Kitchen Physically Abused:   . Sexually Abused:      BP 134/78   Pulse 68   Resp 15   Ht 5\' 2"  (1.575 m)   Wt 137 lb (62.1 kg)   SpO2 96%   BMI 25.06 kg/m   Physical Exam:  Well appearing NAD HEENT: Unremarkable Neck:  No JVD, no thyromegally Lymphatics:  No adenopathy Back:  No CVA tenderness Lungs:  Clear with no wheezes HEART:  Regular rate rhythm, no murmurs, no rubs, no clicks Abd:  soft, positive bowel sounds, no organomegally, no rebound, no guarding Ext:  2 plus pulses, no edema, no cyanosis, no clubbing Skin:  No rashes no nodules Neuro:  CN II through XII intact, motor grossly intact  EKG - NSR with LBBB  DEVICE  Normal device function.  See PaceArt for details.  NSVT  Assess/Plan: 1. Syncope - the etiology remains obscure as she has not syncope since her ILR was placed.  2. VT - she has had 2 asymptomatic episodes. I am concerned about her ventricle worsening.  3. Acute on chronic systolic heart failure - she has had worsening symptoms over the past week but thinks that her sodium intake has increased. We will repeat a 2 D echo. If her EF is down, then  her medical therapy will be adjusted.  4. LBBB - at some point, either biv PPM or ICD will be considered.  Mikle Bosworth.D.

## 2019-06-08 ENCOUNTER — Other Ambulatory Visit: Payer: Self-pay

## 2019-06-08 ENCOUNTER — Ambulatory Visit (HOSPITAL_COMMUNITY): Payer: Medicare HMO | Attending: Cardiovascular Disease

## 2019-06-08 DIAGNOSIS — R0602 Shortness of breath: Secondary | ICD-10-CM | POA: Diagnosis not present

## 2019-06-08 DIAGNOSIS — I472 Ventricular tachycardia: Secondary | ICD-10-CM

## 2019-06-08 DIAGNOSIS — I4729 Other ventricular tachycardia: Secondary | ICD-10-CM

## 2019-06-13 ENCOUNTER — Ambulatory Visit (INDEPENDENT_AMBULATORY_CARE_PROVIDER_SITE_OTHER): Payer: Medicare HMO | Admitting: *Deleted

## 2019-06-13 DIAGNOSIS — R55 Syncope and collapse: Secondary | ICD-10-CM | POA: Diagnosis not present

## 2019-06-13 LAB — CUP PACEART REMOTE DEVICE CHECK
Date Time Interrogation Session: 20210404041202
Implantable Pulse Generator Implant Date: 20180803

## 2019-06-14 NOTE — Progress Notes (Signed)
ILR Remote 

## 2019-06-15 DIAGNOSIS — G5601 Carpal tunnel syndrome, right upper limb: Secondary | ICD-10-CM | POA: Diagnosis not present

## 2019-06-15 DIAGNOSIS — Z4789 Encounter for other orthopedic aftercare: Secondary | ICD-10-CM | POA: Diagnosis not present

## 2019-06-22 ENCOUNTER — Encounter: Payer: Self-pay | Admitting: Gastroenterology

## 2019-06-22 ENCOUNTER — Ambulatory Visit: Payer: Medicare HMO | Admitting: Gastroenterology

## 2019-06-22 VITALS — BP 116/64 | HR 60 | Temp 98.3°F | Ht 62.0 in | Wt 136.2 lb

## 2019-06-22 DIAGNOSIS — Z86010 Personal history of colonic polyps: Secondary | ICD-10-CM | POA: Diagnosis not present

## 2019-06-22 DIAGNOSIS — K2100 Gastro-esophageal reflux disease with esophagitis, without bleeding: Secondary | ICD-10-CM | POA: Diagnosis not present

## 2019-06-22 MED ORDER — NA SULFATE-K SULFATE-MG SULF 17.5-3.13-1.6 GM/177ML PO SOLN: 1.0000 | Freq: Once | ORAL | 0 refills | Status: AC

## 2019-06-22 MED ORDER — PANTOPRAZOLE SODIUM 40 MG PO TBEC: 40.0000 mg | DELAYED_RELEASE_TABLET | Freq: Two times a day (BID) | ORAL | 3 refills | Status: DC

## 2019-06-22 NOTE — Patient Instructions (Signed)
We have sent the following medications to your pharmacy for you to pick up at your convenience:pantoprazole.   You have been scheduled for a colonoscopy. Please follow written instructions given to you at your visit today.  Please pick up your prep supplies at the pharmacy within the next 1-3 days. If you use inhalers (even only as needed), please bring them with you on the day of your procedure.  Thank you for choosing me and [facility].  Pricilla Riffle. Dagoberto Ligas., MD., Marval Regal

## 2019-06-22 NOTE — Progress Notes (Signed)
    History of Present Illness: This is a 74 year old female with a history of GERD and LA class B erosive esophagitis.  Her reflux symptoms are well controlled on pantoprazole 40 mg twice daily and antireflux measures.  She occasionally takes a Pepcid AC for breakthrough symptoms usually related to specific dietary stressors.  She underwent right carpal tunnel surgery last week with Dr. Amedeo Plenty.  No other gastrointestinal complaints.  Current Medications, Allergies, Past Medical History, Past Surgical History, Family History and Social History were reviewed in [employer] record.   Physical Exam: General: Well developed, well nourished, no acute distress Head: Normocephalic and atraumatic Eyes:  sclerae anicteric, EOMI Ears: Normal auditory acuity Mouth: Not examined, mask on during Covid-19 pandemic Lungs: Clear throughout to auscultation Heart: Regular rate and rhythm; no murmurs, rubs or bruits Abdomen: Soft, non tender and non distended. No masses, hepatosplenomegaly or hernias noted. Normal Bowel sounds Rectal: Deferred to colonoscopy Musculoskeletal: Symmetrical with no gross deformities  Pulses:  Normal pulses noted Extremities: Hard cast on right wrist lower arm, no clubbing, cyanosis, edema or deformities noted Neurological: Alert oriented x 4, grossly nonfocal Psychological:  Alert and cooperative. Normal mood and affect   Assessment and Recommendations:  1.  GERD with LA class B erosive esophagitis. Pantoprazole 40 mg po bid. follow antireflux measures.  Pepcid AC twice daily as needed.  2.  Personal history of a sessile serrated adenomatous colon polyp in August 2013.  She is overdue for surveillance colonoscopy.  Schedule colonoscopy.  She prefers to schedule in June.  The risks (including bleeding, perforation, infection, missed lesions, medication reactions and possible hospitalization or surgery if complications occur), benefits, and alternatives to  colonoscopy with possible biopsy and possible polypectomy were discussed with the patient and they consent to proceed.

## 2019-06-24 ENCOUNTER — Other Ambulatory Visit (HOSPITAL_COMMUNITY): Payer: Medicare HMO

## 2019-06-29 DIAGNOSIS — M25641 Stiffness of right hand, not elsewhere classified: Secondary | ICD-10-CM | POA: Diagnosis not present

## 2019-07-07 ENCOUNTER — Other Ambulatory Visit: Payer: Self-pay | Admitting: Primary Care

## 2019-07-07 DIAGNOSIS — G4700 Insomnia, unspecified: Secondary | ICD-10-CM

## 2019-07-08 NOTE — Telephone Encounter (Signed)
Noted, refill sent pharmacy.

## 2019-07-08 NOTE — Telephone Encounter (Signed)
Last prescribed on 04/05/2019 #90 with 0 refills . Last OV on 11/25/2018. No future OV

## 2019-07-13 DIAGNOSIS — Z4789 Encounter for other orthopedic aftercare: Secondary | ICD-10-CM | POA: Diagnosis not present

## 2019-07-13 DIAGNOSIS — G5601 Carpal tunnel syndrome, right upper limb: Secondary | ICD-10-CM | POA: Diagnosis not present

## 2019-07-13 DIAGNOSIS — M79641 Pain in right hand: Secondary | ICD-10-CM | POA: Diagnosis not present

## 2019-07-14 LAB — CUP PACEART REMOTE DEVICE CHECK
Date Time Interrogation Session: 20210505041608
Implantable Pulse Generator Implant Date: 20180803

## 2019-07-18 ENCOUNTER — Ambulatory Visit (INDEPENDENT_AMBULATORY_CARE_PROVIDER_SITE_OTHER): Payer: Medicare HMO | Admitting: *Deleted

## 2019-07-18 DIAGNOSIS — R55 Syncope and collapse: Secondary | ICD-10-CM

## 2019-07-19 NOTE — Progress Notes (Signed)
Carelink Summary Report / Loop Recorder 

## 2019-08-01 ENCOUNTER — Other Ambulatory Visit: Payer: Self-pay | Admitting: Primary Care

## 2019-08-01 DIAGNOSIS — R519 Headache, unspecified: Secondary | ICD-10-CM

## 2019-08-01 NOTE — Telephone Encounter (Signed)
Noted, refill sent to pharmacy. 

## 2019-08-01 NOTE — Telephone Encounter (Signed)
Last prescribed on 04/25/2019 . Last OV on 11/25/2018 (cpe). No future OV scheduled

## 2019-08-17 ENCOUNTER — Telehealth: Payer: Self-pay | Admitting: Primary Care

## 2019-08-17 NOTE — Telephone Encounter (Signed)
Left message for patient to call back and schedule Medicare Annual Wellness Visit (AWV) with Nurse Health Advisor   This should be a telephone visit only.  Last AWV 07/17/15

## 2019-08-22 ENCOUNTER — Ambulatory Visit (INDEPENDENT_AMBULATORY_CARE_PROVIDER_SITE_OTHER): Payer: Medicare HMO | Admitting: *Deleted

## 2019-08-22 DIAGNOSIS — R55 Syncope and collapse: Secondary | ICD-10-CM | POA: Diagnosis not present

## 2019-08-22 LAB — CUP PACEART REMOTE DEVICE CHECK
Date Time Interrogation Session: 20210613232815
Implantable Pulse Generator Implant Date: 20180803

## 2019-08-24 NOTE — Progress Notes (Signed)
Carelink Summary Report / Loop Recorder 

## 2019-08-25 ENCOUNTER — Encounter: Payer: Self-pay | Admitting: Gastroenterology

## 2019-08-29 DIAGNOSIS — D485 Neoplasm of uncertain behavior of skin: Secondary | ICD-10-CM | POA: Diagnosis not present

## 2019-08-29 DIAGNOSIS — C441222 Squamous cell carcinoma of skin of right lower eyelid, including canthus: Secondary | ICD-10-CM | POA: Diagnosis not present

## 2019-09-05 ENCOUNTER — Encounter: Payer: Medicare HMO | Admitting: Gastroenterology

## 2019-09-26 ENCOUNTER — Ambulatory Visit (INDEPENDENT_AMBULATORY_CARE_PROVIDER_SITE_OTHER): Payer: Medicare HMO | Admitting: *Deleted

## 2019-09-26 DIAGNOSIS — R55 Syncope and collapse: Secondary | ICD-10-CM | POA: Diagnosis not present

## 2019-09-26 LAB — CUP PACEART REMOTE DEVICE CHECK
Date Time Interrogation Session: 20210718230555
Implantable Pulse Generator Implant Date: 20180803

## 2019-09-28 NOTE — Progress Notes (Signed)
Carelink Summary Report / Loop Recorder 

## 2019-10-04 DIAGNOSIS — C441122 Basal cell carcinoma of skin of right lower eyelid, including canthus: Secondary | ICD-10-CM | POA: Diagnosis not present

## 2019-10-10 ENCOUNTER — Other Ambulatory Visit: Payer: Self-pay | Admitting: Primary Care

## 2019-10-10 DIAGNOSIS — R519 Headache, unspecified: Secondary | ICD-10-CM

## 2019-10-10 DIAGNOSIS — G4700 Insomnia, unspecified: Secondary | ICD-10-CM

## 2019-10-10 NOTE — Telephone Encounter (Signed)
Patient due for CPE in mid September, please notify her of this and that she'll need this visit for further refills. Will send in refill of zolpidem for now.

## 2019-10-10 NOTE — Telephone Encounter (Signed)
Last prescribed on 07/08/2019 Last OV (cpe) with Allie Bossier on 11/25/2018 No future OV scheduled

## 2019-10-18 DIAGNOSIS — R921 Mammographic calcification found on diagnostic imaging of breast: Secondary | ICD-10-CM | POA: Diagnosis not present

## 2019-10-18 NOTE — Telephone Encounter (Signed)
Patient called in and scheduled for cpe in September.

## 2019-10-18 NOTE — Telephone Encounter (Signed)
Called patient to schedule. LVM to call back to schedule cpe.

## 2019-10-19 LAB — HM MAMMOGRAPHY

## 2019-10-23 ENCOUNTER — Encounter: Payer: Self-pay | Admitting: Emergency Medicine

## 2019-10-23 ENCOUNTER — Other Ambulatory Visit: Payer: Self-pay

## 2019-10-23 ENCOUNTER — Emergency Department
Admission: EM | Admit: 2019-10-23 | Discharge: 2019-10-23 | Disposition: A | Discharge status: Home / Self Care | Payer: Medicare HMO | Attending: Emergency Medicine | Admitting: Emergency Medicine

## 2019-10-23 ENCOUNTER — Emergency Department: Payer: Medicare HMO

## 2019-10-23 ENCOUNTER — Telehealth: Payer: Self-pay | Admitting: Physician Assistant

## 2019-10-23 DIAGNOSIS — R5383 Other fatigue: Secondary | ICD-10-CM | POA: Insufficient documentation

## 2019-10-23 DIAGNOSIS — R110 Nausea: Secondary | ICD-10-CM | POA: Diagnosis not present

## 2019-10-23 DIAGNOSIS — J90 Pleural effusion, not elsewhere classified: Secondary | ICD-10-CM | POA: Diagnosis not present

## 2019-10-23 DIAGNOSIS — Z5321 Procedure and treatment not carried out due to patient leaving prior to being seen by health care provider: Secondary | ICD-10-CM | POA: Diagnosis not present

## 2019-10-23 DIAGNOSIS — R079 Chest pain, unspecified: Secondary | ICD-10-CM | POA: Insufficient documentation

## 2019-10-23 LAB — CBC
HCT: 38.3 % (ref 36.0–46.0)
Hemoglobin: 13.3 g/dL (ref 12.0–15.0)
MCH: 30.2 pg (ref 26.0–34.0)
MCHC: 34.7 g/dL (ref 30.0–36.0)
MCV: 86.8 fL (ref 80.0–100.0)
Platelets: 244 10*3/uL (ref 150–400)
RBC: 4.41 MIL/uL (ref 3.87–5.11)
RDW: 12 % (ref 11.5–15.5)
WBC: 7.4 10*3/uL (ref 4.0–10.5)
nRBC: 0 % (ref 0.0–0.2)

## 2019-10-23 LAB — BASIC METABOLIC PANEL
Anion gap: 11 (ref 5–15)
BUN: 17 mg/dL (ref 8–23)
CO2: 25 mmol/L (ref 22–32)
Calcium: 9.6 mg/dL (ref 8.9–10.3)
Chloride: 104 mmol/L (ref 98–111)
Creatinine, Ser: 0.52 mg/dL (ref 0.44–1.00)
GFR calc Af Amer: >60 mL/min (ref >60)
GFR calc non Af Amer: >60 mL/min (ref >60)
Glucose, Bld: 108 mg/dL — ABNORMAL HIGH (ref 70–99)
Potassium: 3.8 mmol/L (ref 3.5–5.1)
Sodium: 140 mmol/L (ref 135–145)

## 2019-10-23 LAB — TROPONIN I (HIGH SENSITIVITY): Troponin I (High Sensitivity): 3 ng/L (ref <18)

## 2019-10-23 NOTE — Telephone Encounter (Signed)
74 year old female with history of nonischemic cardiomyopathy, unexplained syncope status post loop recorder, nonsustained ventricular tachycardia and left bundle branch block.  She called the answering service today with complaints of severe left-sided chest discomfort lasting 45 minutes to an hour.  She rates it a 9/10.  She had radiation to her jaw and associated nausea.  She did not take nitroglycerin.  Since then, she has felt fatigued and just does not feel right.  She is not short of breath.  She has not had dysphagia or belching.  Given the duration and severity of symptoms and her current symptoms, I have recommended she be evaluated in the emergency room today.  I will also leave a message with our schedulers to try to arrange follow-up in the office should she be sent home from the emergency room.  Richardson Dopp, PA-C    10/23/2019 5:28 PM

## 2019-10-23 NOTE — ED Triage Notes (Addendum)
Pt arrived via POV with reports of chest pain that started during the morning, which lasted x1 hour. Pt reports she has not felt good the rest of the day, pt reports she called her cardiology office and was instructed to come to the ED.  Pt reports the pain radiated to her jaw, c/o nausea, and fatigue  Pt reports she has LBBB and loop recorder.[name]

## 2019-10-24 ENCOUNTER — Ambulatory Visit: Payer: Medicare HMO | Admitting: Internal Medicine

## 2019-10-24 DIAGNOSIS — R072 Precordial pain: Secondary | ICD-10-CM

## 2019-10-24 MED ORDER — METOPROLOL TARTRATE 100 MG PO TABS
100.0000 mg | ORAL_TABLET | Freq: Once | ORAL | 0 refills | Status: DC
Start: 2019-10-24 — End: 2019-12-14

## 2019-10-24 NOTE — Progress Notes (Signed)
Cardiology Office Note   Date:  10/24/2019   ID:  [name], DOB Jun 22, 1945, MRN [id number]  PCP:  [name], NP  Cardiologist:   Dorris Carnes, MD   Pt presents for f/u of CP   History of Present Illness: [name] is a 74 y.o. female with a history of NICM (LVEF 50 to 55% in 2011), LBBB, CP  Normal myovue in 2014  She also had a history ofnear syncope and chest tightness in May 2018 She has been seen by G taylor and had ILR placed    She has had chest tightness in past     The pt comes with her daughter today     Saturday she felt dizzy.   Yesterday  AM (8/15) she was listening to church service in chair   Having breakfest   Develped L chest , jaw / shoulder discomfort   No SOB     She laid down   Felt nauseated.    Did not take NTG    Pt called On Call service   Told them about L sided pain 9/10     Went to jaw   No nitro   Fatigue after     Told to go to ED     Labs: CBC normal   HS Troponin was 3   LEft before 2nd set   Since then the pt has had no further pain  Felt tired   Different from GI reflux     Current Meds  Medication Sig  . acetaminophen (TYLENOL) 500 MG tablet Take 1,000 mg by mouth every 6 (six) hours as needed for moderate pain or headache.  Marland Kitchen amLODipine (NORVASC) 2.5 MG tablet Take 1 tablet (2.5 mg total) by mouth daily.  . carvedilol (COREG) 12.5 MG tablet Take 1 tablet (12.5 mg total) by mouth 2 (two) times daily with a meal. TAKE ONE (1) TABLET BY MOUTH TWO TIMES PER DAY WITH A MEAL  . Cholecalciferol (VITAMIN D3) 2000 units capsule Take 2,000 Units by mouth daily.  . enalapril (VASOTEC) 10 MG tablet Take 1 tablet (10 mg total) by mouth daily.  Marland Kitchen escitalopram (LEXAPRO) 20 MG tablet Take 1 tablet (20 mg total) by mouth daily. For anxiety and depression.  . Multiple Vitamin (MULTIVITAMIN) capsule Take 1 capsule by mouth daily.    . nitroGLYCERIN (NITROSTAT) 0.4 MG SL tablet Place 1 tablet (0.4 mg total) under the tongue every 5 (five) minutes  as needed for chest pain.  . pantoprazole (PROTONIX) 40 MG tablet Take 1 tablet (40 mg total) by mouth 2 (two) times daily. For heartburn  . Polyvinyl Alcohol-Povidone (REFRESH OP) Apply to eye.  . promethazine (PHENERGAN) 12.5 MG tablet TAKE ONE TABLET TWICE DAILY AS NEEDED FOR NAUSEA / VOMITING  . rosuvastatin (CRESTOR) 20 MG tablet Take 1 tablet (20 mg total) by mouth daily. For cholesterol.  Manus Gunning BOWEL PREP KIT 17.5-3.13-1.6 GM/177ML SOLN TAKE AS DIRECTED BY MOUTH ONCE FOR 1 DOSE  . tiZANidine (ZANAFLEX) 2 MG tablet Take 1 tablet (2 mg total) by mouth 2 (two) times daily as needed for muscle spasms.  Marland Kitchen zolpidem (AMBIEN) 10 MG tablet TAKE ONE TABLET AT BEDTIME AS NEEDED FORSLEEP     Allergies:   Penicillin g, Penicillins, Clindamycin, Lansoprazole, Lincomycin, Sulfa antibiotics, Sulfasalazine, Cefaclor, Cefuroxime axetil, Clindamycin/lincomycin, Latex, Lincomycin hcl, and Sulfamethoxazole-trimethoprim   Past Medical History:  Diagnosis Date  . Cardiomyopathy    nonischemic  . Chronic back pain   .  Complication of anesthesia   . Erosive esophagitis    LA Classification Grade B  . Essential hypertension   . GERD (gastroesophageal reflux disease)   . Hiatal hernia   . Hyperlipidemia   . Insomnia   . MDD (major depressive disorder)   . Melanoma ([place])    basil cell, pt reports no melanoma  . Migraine   . Osteopenia   . PONV (postoperative nausea and vomiting)   . Rosacea   . Sessile colonic polyp 10/2011   adenoma  . Squamous carcinoma    scalp    Past Surgical History:  Procedure Laterality Date  . APPENDECTOMY    . CARDIAC CATHETERIZATION    . CATARACT EXTRACTION W/PHACO Right 03/18/2016   Procedure: CATARACT EXTRACTION PHACO AND INTRAOCULAR LENS PLACEMENT (IOC);  Surgeon: Birder [name], MD;  Location: [facility];  Service: Ophthalmology;  Laterality: Right;  [place] 45.3 AP% 22.2 CDE 10.07 Fluid pack lot # [id number]  . CATARACT EXTRACTION W/PHACO Left 04/08/2016    Procedure: CATARACT EXTRACTION PHACO AND INTRAOCULAR LENS PLACEMENT (IOC);  Surgeon: Birder [name], MD;  Location: [facility] OR[facility];  Service: Ophthalmology;  Laterality: Left;  [place] 00:41 AP% 15.0 CDE 6.27 Fluid pack lot # [id number]  . LOOP RECORDER INSERTION N/A 10/10/2016   Procedure: Loop Recorder Insertion;  Surgeon: Evans Lance, MD;  Location: [facility];  Service: Cardiovascular;  Laterality: N/A;  . SKIN CANCER EXCISION     scalp  . TUBAL LIGATION    . VAGINAL HYSTERECTOMY       Social History:  The patient  reports that she quit smoking about 38 years ago. She has never used smokeless tobacco. She reports current alcohol use of about 3.0 standard drinks of alcohol per week. She reports that she does not use drugs.   Family History:  The patient's family history includes Alzheimer's disease in her mother; Arthritis/Rheumatoid in her sister; Heart disease in her father.    ROS:  Please see the history of present illness. All other systems are reviewed and  Negative to the above problem except as noted.    PHYSICAL EXAM: VS:  BP 126/66   Pulse 74   Ht 5' 2" (1.575 m)   Wt 130 lb 9.6 oz (59.2 kg)   SpO2 98%   BMI 23.89 kg/m   GEN: Pt is a 74 yo female  in no acute distress  HEENT: normal Neck: JVP is not elevated   No carotid bruits Cardiac: RRR; no murmurs  No LE edema  Respiratory:  clear to auscultation bilaterally,  GI: soft, nontender, nondistended, + BS  No hepatomegaly  MS:  Moving all extremities   Skin: warm and dry    EKG:  EKG is not ordered today.  On 8/15  SR LBBB  Lipid Panel    Component Value Date/Time   CHOL 128 11/25/2018 1017   CHOL 186 05/04/2017 1024   TRIG 142.0 11/25/2018 1017   HDL 54.90 11/25/2018 1017   HDL 44 05/04/2017 1024   CHOLHDL 2 11/25/2018 1017   VLDL 28.4 11/25/2018 1017   LDLCALC 44 11/25/2018 1017   LDLCALC 69 05/04/2017 1024   LDLDIRECT 66.0 10/23/2017 1016      Wt Readings from Last 3 Encounters:  10/24/19 130  lb 9.6 oz (59.2 kg)  10/23/19 130 lb (59 kg)  06/22/19 136 lb 3.2 oz (61.8 kg)    Echo 06/08/19 1. Left ventricular ejection fraction, by estimation, is 50 to 55%. The left ventricle  has low normal function. The left ventricle has no regional wall motion abnormalities. There is mild left ventricular hypertrophy of the basal and septal segments. Left ventricular diastolic parameters are consistent with Grade I diastolic dysfunction (impaired relaxation). 2. Right ventricular systolic function is normal. The right ventricular size is normal. Tricuspid regurgitation signal is inadequate for assessing PA pressure. 3. Left atrial size was mildly dilated. 4. The mitral valve is normal in structure. Trivial mitral valve regurgitation. 5. The aortic valve is normal in structure. Aortic valve regurgitation is not visualized. Mild aortic valve sclerosis is present, with no evidence of aortic valve stenosis. 6. The inferior vena cava is normal in size with greater than 50% respiratory variability, suggesting right atrial pressure of 3 mmHg.  ASSESSMENT AND PLAN:  1  Chest pain  REcent ED visit   Trop neg despite significant spell.    I would recomm CT coronary angiogram to define      2 Hx  NICM   Last echo in March 2021, LVEF normal  Today volume status is OK     3  Near syncope  Follows with EP   Has  ILR in place  4  HL  Continue Crestor  5  LBBB   F/U based on test results  Current medicines are reviewed at length with the patient today.  The patient does not have concerns regarding medicines.  Signed, Dorris Carnes, MD  10/24/2019 12:05 PM    [address], [place], [place]  [address] Phone: [phone]; Fax: [phone]

## 2019-10-24 NOTE — Patient Instructions (Addendum)
Medication Instructions:  No changes today *If you need a refill on your cardiac medications before your next appointment, please call your pharmacy*   Lab Work: none  Testing/Procedures: See below   Follow-Up: Depends on test results  Other Instructions   Your cardiac CT will be scheduled at one of the below locations:   [facility] [address] [place], [place] [address] [phone]  OR  [facility] [address] [place], [place] [address] [phone]  If scheduled at [facility], please arrive at the North Tower main entrance of [facility] 30 minutes prior to test start time. Proceed to the [facility] (first floor) to check-in and test prep.  If scheduled at [facility], please arrive 15 mins early for check-in and test prep.  Please follow these instructions carefully (unless otherwise directed):   On the Night Before the Test: . Be sure to Drink plenty of water. . Do not consume any caffeinated/decaffeinated beverages or chocolate 12 hours prior to your test. Do not take any antihistamines 12 hours prior to your test.  On the Day of the Test: . Drink plenty of water. Do not drink any water within one hour of the test. . Do not eat any food 4 hours prior to the test. . Do not take carvedilol on the morning of the test. . You may take your regular medications prior to the test.  . Take metoprolol (Lopressor) two hours prior to test. . FEMALES- please wear underwire-free bra if available       After the Test: . Drink plenty of water. . After receiving IV contrast, you may experience a mild flushed feeling. This is normal. . On occasion, you may experience a mild rash up to 24 hours after the test. This is not dangerous. If this occurs, you can take Benadryl 25 mg and increase your fluid intake. . If you experience trouble  breathing, this can be serious. If it is severe call 911 IMMEDIATELY. If it is mild, please call our office. . If you take any of these medications: Glipizide/Metformin, Avandament, Glucavance, please do not take 48 hours after completing test unless otherwise instructed.   Once we have confirmed authorization from your insurance company, we will call you to set up a date and time for your test. Based on how quickly your insurance processes prior authorizations requests, please allow up to 4 weeks to be contacted for scheduling your Cardiac CT appointment. Be advised that routine Cardiac CT appointments could be scheduled as many as 8 weeks after your provider has ordered it.  For non-scheduling related questions, please contact the cardiac imaging nurse navigator should you have any questions/concerns: Sara Wallace, Cardiac Imaging Nurse Navigator Merle Tai, Interim Cardiac Imaging Nurse Navigator [facility] Direct Office Dial: [phone]   For scheduling needs, including cancellations and rescheduling, please call Toni at [phone], option 3.     

## 2019-10-28 ENCOUNTER — Encounter: Payer: Self-pay | Admitting: Primary Care

## 2019-10-30 LAB — CUP PACEART REMOTE DEVICE CHECK
Date Time Interrogation Session: 20210818232719
Implantable Pulse Generator Implant Date: 20180803

## 2019-10-31 ENCOUNTER — Ambulatory Visit (INDEPENDENT_AMBULATORY_CARE_PROVIDER_SITE_OTHER): Payer: Medicare HMO | Admitting: *Deleted

## 2019-10-31 DIAGNOSIS — I429 Cardiomyopathy, unspecified: Secondary | ICD-10-CM

## 2019-11-02 ENCOUNTER — Telehealth (HOSPITAL_COMMUNITY): Payer: Self-pay | Admitting: Emergency Medicine

## 2019-11-02 NOTE — Telephone Encounter (Signed)
Reaching out to patient to offer assistance regarding upcoming cardiac imaging study; pt verbalizes understanding of appt date/time, parking situation and where to check in, pre-test NPO status and medications ordered, and verified current allergies; name and call back number provided for further questions should they arise Aristotle Lieb RN Navigator Cardiac Imaging [facility] [phone] office [phone] cell 

## 2019-11-03 ENCOUNTER — Other Ambulatory Visit: Payer: Self-pay

## 2019-11-03 ENCOUNTER — Ambulatory Visit
Admission: RE | Admit: 2019-11-03 | Discharge: 2019-11-03 | Disposition: A | Discharge status: Home / Self Care | Payer: Medicare HMO | Source: Ambulatory Visit | Attending: Internal Medicine | Admitting: Internal Medicine

## 2019-11-03 ENCOUNTER — Telehealth: Payer: Self-pay

## 2019-11-03 DIAGNOSIS — R072 Precordial pain: Secondary | ICD-10-CM | POA: Insufficient documentation

## 2019-11-03 MED ORDER — NITROGLYCERIN 0.4 MG SL SUBL
0.8000 mg | SUBLINGUAL_TABLET | Freq: Once | SUBLINGUAL | Status: AC
  Administered 2019-11-03: 0.8 mg via SUBLINGUAL

## 2019-11-03 MED ORDER — IOHEXOL 350 MG/ML SOLN
75.0000 mL | Freq: Once | INTRAVENOUS | Status: AC | PRN
  Administered 2019-11-03: 75 mL via INTRAVENOUS

## 2019-11-03 NOTE — Progress Notes (Signed)
Patient tolerated procedure well. Ambulate w/o difficulty. Sitting in chair drinking. No needs. All questions answered. ABC intact. Discharge from procedure area w/o issues.

## 2019-11-03 NOTE — Telephone Encounter (Signed)
Pt lmovm requesting Ax to be sent in for UTI...Marland Kitchen per Anda Kraft she will fit pt in at 12:20  11/04/2019 for UTI

## 2019-11-04 ENCOUNTER — Ambulatory Visit (INDEPENDENT_AMBULATORY_CARE_PROVIDER_SITE_OTHER): Payer: Medicare HMO | Admitting: Primary Care

## 2019-11-04 ENCOUNTER — Encounter: Payer: Self-pay | Admitting: Primary Care

## 2019-11-04 VITALS — BP 126/68 | HR 71 | Ht 62.0 in | Wt 131.0 lb

## 2019-11-04 DIAGNOSIS — R3989 Other symptoms and signs involving the genitourinary system: Secondary | ICD-10-CM

## 2019-11-04 LAB — POC URINALSYSI DIPSTICK (AUTOMATED)
Bilirubin, UA: NEGATIVE
Blood, UA: NEGATIVE
Glucose, UA: NEGATIVE
Ketones, UA: NEGATIVE
Leukocytes, UA: NEGATIVE
Nitrite, UA: NEGATIVE
Protein, UA: NEGATIVE
Spec Grav, UA: 1.01 (ref 1.010–1.025)
Urobilinogen, UA: 0.2 E.U./dL
pH, UA: 6 (ref 5.0–8.0)

## 2019-11-04 LAB — POCT URINALYSIS DIPSTICK
Bilirubin, UA: NEGATIVE
Blood, UA: NEGATIVE
Glucose, UA: NEGATIVE
Ketones, UA: NEGATIVE
Leukocytes, UA: NEGATIVE
Nitrite, UA: NEGATIVE
Protein, UA: NEGATIVE
Spec Grav, UA: 1.01 (ref 1.010–1.025)
Urobilinogen, UA: 0.2 E.U./dL
pH, UA: 6 (ref 5.0–8.0)

## 2019-11-04 NOTE — Assessment & Plan Note (Signed)
Acute urinary symptoms x 6 days, no improvement. UA today negative and appears clear. Culture sent to verify given symptoms.

## 2019-11-04 NOTE — Progress Notes (Signed)
Carelink Summary Report / Loop Recorder 

## 2019-11-04 NOTE — Patient Instructions (Signed)
We will be in touch once we receive your urine culture test.  It was a pleasure to see you today!

## 2019-11-04 NOTE — Progress Notes (Signed)
Subjective:    Patient ID: [name], female    DOB: 30-May-1945, 74 y.o.   MRN: [id number]  HPI  This visit occurred during the SARS-CoV-2 public health emergency.  Safety protocols were in place, including screening questions prior to the visit, additional usage of staff PPE, and extensive cleaning of exam room while observing appropriate contact time as indicated for disinfecting solutions.   [name] is a 74 year old female with a history of syncope, cardiomyopathy, anxiety and depression, GERD, frequent headaches, acute cystitis who presents today with a chief complaint of dysuria.  She also reports urinary frequency, pelvic/suprapubic pressure, cloudy color to urine. Symptoms began six days ago. She's taken AZO with her last dose being five days ago.   She's been hydrating well with water. She denies fevers, hematuria, flank pain.   BP Readings from Last 3 Encounters:  11/04/19 126/68  11/03/19 (!) 125/59  10/24/19 126/66     Review of Systems  Constitutional: Negative for fever.  Gastrointestinal: Negative for abdominal pain.  Genitourinary: Positive for dysuria and frequency. Negative for flank pain, hematuria and vaginal discharge.       Past Medical History:  Diagnosis Date  . Cardiomyopathy    nonischemic  . Chronic back pain   . Complication of anesthesia   . Erosive esophagitis    LA Classification Grade B  . Essential hypertension   . GERD (gastroesophageal reflux disease)   . Hiatal hernia   . Hyperlipidemia   . Insomnia   . MDD (major depressive disorder)   . Melanoma ([place])    basil cell, pt reports no melanoma  . Migraine   . Osteopenia   . PONV (postoperative nausea and vomiting)   . Rosacea   . Sessile colonic polyp 10/2011   adenoma  . Squamous carcinoma    scalp     Social History   Socioeconomic History  . Marital status: Widowed    Spouse name: Not on file  . Number of children: 3  . Years of education: 86  . Highest education  level: Not on file  Occupational History  . Not on file  Tobacco Use  . Smoking status: Former Smoker    Quit date: 06/03/1981    Years since quitting: 38.4  . Smokeless tobacco: Never Used  Vaping Use  . Vaping Use: Never used  Substance and Sexual Activity  . Alcohol use: Yes    Alcohol/week: 3.0 standard drinks    Types: 3 Glasses of wine per week    Comment: Wine per night  . Drug use: No  . Sexual activity: Not on file  Other Topics Concern  . Not on file  Social History Narrative   Lives alone in two-story home.     Her husband passed away unexpectedly.     They have three children- one is a pediatrician.   Desires CPR.   Caffeine use: Decaf coffee   Right handed   Social Determinants of Health   Financial Resource Strain:   . Difficulty of Paying Living Expenses: Not on file  Food Insecurity:   . Worried About Charity fundraiser in the Last Year: Not on file  . Ran Out of Food in the Last Year: Not on file  Transportation Needs:   . Lack of Transportation (Medical): Not on file  . Lack of Transportation (Non-Medical): Not on file  Physical Activity:   . Days of Exercise per Week: Not on file  .  Minutes of Exercise per Session: Not on file  Stress:   . Feeling of Stress : Not on file  Social Connections:   . Frequency of Communication with Friends and Family: Not on file  . Frequency of Social Gatherings with Friends and Family: Not on file  . Attends Religious Services: Not on file  . Active Member of Clubs or Organizations: Not on file  . Attends Archivist Meetings: Not on file  . Marital Status: Not on file  Intimate Partner Violence:   . Fear of Current or Ex-Partner: Not on file  . Emotionally Abused: Not on file  . Physically Abused: Not on file  . Sexually Abused: Not on file    Past Surgical History:  Procedure Laterality Date  . APPENDECTOMY    . CARDIAC CATHETERIZATION    . CATARACT EXTRACTION W/PHACO Right 03/18/2016   Procedure:  CATARACT EXTRACTION PHACO AND INTRAOCULAR LENS PLACEMENT (IOC);  Surgeon: Birder [name], MD;  Location: [facility];  Service: Ophthalmology;  Laterality: Right;  [place] 45.3 AP% 22.2 CDE 10.07 Fluid pack lot # [id number]  . CATARACT EXTRACTION W/PHACO Left 04/08/2016   Procedure: CATARACT EXTRACTION PHACO AND INTRAOCULAR LENS PLACEMENT (IOC);  Surgeon: Birder [name], MD;  Location: [facility];  Service: Ophthalmology;  Laterality: Left;  [place] 00:41 AP% 15.0 CDE 6.27 Fluid pack lot # [id number]  . LOOP RECORDER INSERTION N/A 10/10/2016   Procedure: Loop Recorder Insertion;  Surgeon: Evans Lance, MD;  Location: [place] [facility];  Service: Cardiovascular;  Laterality: N/A;  . SKIN CANCER EXCISION     scalp  . TUBAL LIGATION    . VAGINAL HYSTERECTOMY      Family History  Problem Relation Age of Onset  . Alzheimer's disease Mother   . Heart disease Father   . Arthritis/Rheumatoid Sister   . Colon cancer Neg Hx   . Stomach cancer Neg Hx   . Rectal cancer Neg Hx   . Esophageal cancer Neg Hx     Allergies  Allergen Reactions  . Penicillin G Swelling    Rash,  . Penicillins Anaphylaxis and Hives    Has patient had a PCN reaction causing immediate rash, facial/tongue/throat swelling, SOB or lightheadedness with hypotension: {Yes Has patient had a PCN reaction causing severe rash involving mucus membranes or skin necrosis: {no Has patient had a PCN reaction that required hospitalization {yes Has patient had a PCN reaction occurring within the last 10 years: {no If all of the above answers are "NO", then may proceed with Cephalosporin use.  . Clindamycin Itching    rash  . Lansoprazole Other (See Comments)    REACTION: headaches  . Lincomycin Itching    rash  . Sulfa Antibiotics Hives  . Sulfasalazine Hives  . Cefaclor Itching and Rash    rash  . Cefuroxime Axetil Rash and Itching    rash  . Clindamycin/Lincomycin Itching and Rash  . Latex Itching and Rash    rash  . Lincomycin  Hcl Itching and Rash  . Sulfamethoxazole-Trimethoprim Rash    Current Outpatient Medications on File Prior to Visit  Medication Sig Dispense Refill  . acetaminophen (TYLENOL) 500 MG tablet Take 1,000 mg by mouth every 6 (six) hours as needed for moderate pain or headache.    Marland Kitchen amLODipine (NORVASC) 2.5 MG tablet Take 1 tablet (2.5 mg total) by mouth daily. 90 tablet 3  . carvedilol (COREG) 12.5 MG tablet Take 1 tablet (12.5 mg total) by mouth 2 (two) times  daily with a meal. TAKE ONE (1) TABLET BY MOUTH TWO TIMES PER DAY WITH A MEAL 180 tablet 3  . Cholecalciferol (VITAMIN D3) 2000 units capsule Take 2,000 Units by mouth daily.    . enalapril (VASOTEC) 10 MG tablet Take 1 tablet (10 mg total) by mouth daily. 90 tablet 3  . escitalopram (LEXAPRO) 20 MG tablet Take 1 tablet (20 mg total) by mouth daily. For anxiety and depression. 90 tablet 2  . metoprolol tartrate (LOPRESSOR) 100 MG tablet Take 1 tablet (100 mg total) by mouth once for 1 dose. Take 2 hours prior to ct 1 tablet 0  . Multiple Vitamin (MULTIVITAMIN) capsule Take 1 capsule by mouth daily.      . nitroGLYCERIN (NITROSTAT) 0.4 MG SL tablet Place 1 tablet (0.4 mg total) under the tongue every 5 (five) minutes as needed for chest pain. 30 tablet 3  . pantoprazole (PROTONIX) 40 MG tablet Take 1 tablet (40 mg total) by mouth 2 (two) times daily. For heartburn 180 tablet 3  . Polyvinyl Alcohol-Povidone (REFRESH OP) Apply to eye.    . promethazine (PHENERGAN) 12.5 MG tablet TAKE ONE TABLET TWICE DAILY AS NEEDED FOR NAUSEA / VOMITING 20 tablet 0  . rosuvastatin (CRESTOR) 20 MG tablet Take 1 tablet (20 mg total) by mouth daily. For cholesterol. 90 tablet 3  . SUPREP BOWEL PREP KIT 17.5-3.13-1.6 GM/177ML SOLN TAKE AS DIRECTED BY MOUTH ONCE FOR 1 DOSE    . tiZANidine (ZANAFLEX) 2 MG tablet Take 1 tablet (2 mg total) by mouth 2 (two) times daily as needed for muscle spasms. 30 tablet 0  . zolpidem (AMBIEN) 10 MG tablet TAKE ONE TABLET AT BEDTIME  AS NEEDED FORSLEEP 30 tablet 0   No current facility-administered medications on file prior to visit.    BP 126/68   Pulse 71   Ht _0  (1.575 m)   Wt 131 lb (59.4 kg)   SpO2 97%   BMI 23.96 kg/m    Objective:   Physical Exam Cardiovascular:     Rate and Rhythm: Normal rate and regular rhythm.  Pulmonary:     Effort: Pulmonary effort is normal.     Breath sounds: Normal breath sounds.  Abdominal:     General: Abdomen is flat.     Palpations: Abdomen is soft.     Tenderness: There is abdominal tenderness in the suprapubic area. There is no right CVA tenderness or left CVA tenderness.  Musculoskeletal:     Cervical back: Neck supple.  Skin:    General: Skin is warm and dry.            Assessment & Plan:

## 2019-11-05 LAB — URINE CULTURE
MICRO NUMBER:: 10881907
SPECIMEN QUALITY:: ADEQUATE

## 2019-11-10 ENCOUNTER — Other Ambulatory Visit: Payer: Self-pay | Admitting: Primary Care

## 2019-11-10 DIAGNOSIS — E7800 Pure hypercholesterolemia, unspecified: Secondary | ICD-10-CM

## 2019-11-15 ENCOUNTER — Other Ambulatory Visit: Payer: Self-pay | Admitting: Primary Care

## 2019-11-15 DIAGNOSIS — G4700 Insomnia, unspecified: Secondary | ICD-10-CM

## 2019-11-15 NOTE — Telephone Encounter (Signed)
Ambien refill request.  Last seen 11/04/2019, last filled 10/10/2019.

## 2019-11-15 NOTE — Telephone Encounter (Signed)
Refills sent to pharmacy. 

## 2019-11-18 DIAGNOSIS — D2261 Melanocytic nevi of right upper limb, including shoulder: Secondary | ICD-10-CM | POA: Diagnosis not present

## 2019-11-18 DIAGNOSIS — X32XXXA Exposure to sunlight, initial encounter: Secondary | ICD-10-CM | POA: Diagnosis not present

## 2019-11-18 DIAGNOSIS — D485 Neoplasm of uncertain behavior of skin: Secondary | ICD-10-CM | POA: Diagnosis not present

## 2019-11-18 DIAGNOSIS — L570 Actinic keratosis: Secondary | ICD-10-CM | POA: Diagnosis not present

## 2019-11-18 DIAGNOSIS — D0471 Carcinoma in situ of skin of right lower limb, including hip: Secondary | ICD-10-CM | POA: Diagnosis not present

## 2019-11-21 ENCOUNTER — Other Ambulatory Visit: Payer: Self-pay

## 2019-11-21 ENCOUNTER — Other Ambulatory Visit (INDEPENDENT_AMBULATORY_CARE_PROVIDER_SITE_OTHER): Payer: Medicare HMO

## 2019-11-21 DIAGNOSIS — E7800 Pure hypercholesterolemia, unspecified: Secondary | ICD-10-CM

## 2019-11-21 LAB — CBC
HCT: 38.6 % (ref 36.0–46.0)
Hemoglobin: 12.9 g/dL (ref 12.0–15.0)
MCHC: 33.4 g/dL (ref 30.0–36.0)
MCV: 89.1 fl (ref 78.0–100.0)
Platelets: 256 10*3/uL (ref 150.0–400.0)
RBC: 4.33 Mil/uL (ref 3.87–5.11)
RDW: 12.8 % (ref 11.5–15.5)
WBC: 6.3 10*3/uL (ref 4.0–10.5)

## 2019-11-21 LAB — LIPID PANEL
Cholesterol: 137 mg/dL (ref 0–200)
HDL: 55.3 mg/dL (ref >39.00)
LDL Cholesterol: 41 mg/dL (ref 0–99)
NonHDL: 81.39
Total CHOL/HDL Ratio: 2
Triglycerides: 200 mg/dL — ABNORMAL HIGH (ref 0.0–149.0)
VLDL: 40 mg/dL (ref 0.0–40.0)

## 2019-11-21 LAB — COMPREHENSIVE METABOLIC PANEL
ALT: 17 U/L (ref 0–35)
AST: 19 U/L (ref 0–37)
Albumin: 4.3 g/dL (ref 3.5–5.2)
Alkaline Phosphatase: 63 U/L (ref 39–117)
BUN: 13 mg/dL (ref 6–23)
CO2: 29 mEq/L (ref 19–32)
Calcium: 9.6 mg/dL (ref 8.4–10.5)
Chloride: 103 mEq/L (ref 96–112)
Creatinine, Ser: 0.7 mg/dL (ref 0.40–1.20)
GFR: 81.51 mL/min (ref >60.00)
Glucose, Bld: 94 mg/dL (ref 70–99)
Potassium: 4.1 mEq/L (ref 3.5–5.1)
Sodium: 140 mEq/L (ref 135–145)
Total Bilirubin: 0.6 mg/dL (ref 0.2–1.2)
Total Protein: 6.8 g/dL (ref 6.0–8.3)

## 2019-11-28 ENCOUNTER — Encounter: Payer: Medicare HMO | Admitting: Primary Care

## 2019-12-05 ENCOUNTER — Ambulatory Visit (INDEPENDENT_AMBULATORY_CARE_PROVIDER_SITE_OTHER): Payer: Medicare HMO | Admitting: Emergency Medicine

## 2019-12-05 DIAGNOSIS — R55 Syncope and collapse: Secondary | ICD-10-CM | POA: Diagnosis not present

## 2019-12-06 LAB — CUP PACEART REMOTE DEVICE CHECK
Date Time Interrogation Session: 20210918232229
Implantable Pulse Generator Implant Date: 20180803

## 2019-12-07 NOTE — Progress Notes (Signed)
Carelink Summary Report / Loop Recorder 

## 2019-12-14 ENCOUNTER — Other Ambulatory Visit: Payer: Self-pay

## 2019-12-14 ENCOUNTER — Ambulatory Visit (INDEPENDENT_AMBULATORY_CARE_PROVIDER_SITE_OTHER): Payer: Medicare HMO | Admitting: Primary Care

## 2019-12-14 ENCOUNTER — Encounter: Payer: Self-pay | Admitting: Primary Care

## 2019-12-14 VITALS — BP 124/64 | HR 73 | Temp 97.6°F | Ht 62.0 in | Wt 132.0 lb

## 2019-12-14 DIAGNOSIS — K219 Gastro-esophageal reflux disease without esophagitis: Secondary | ICD-10-CM

## 2019-12-14 DIAGNOSIS — G4700 Insomnia, unspecified: Secondary | ICD-10-CM

## 2019-12-14 DIAGNOSIS — R300 Dysuria: Secondary | ICD-10-CM

## 2019-12-14 DIAGNOSIS — Z0000 Encounter for general adult medical examination without abnormal findings: Secondary | ICD-10-CM

## 2019-12-14 DIAGNOSIS — F419 Anxiety disorder, unspecified: Secondary | ICD-10-CM

## 2019-12-14 DIAGNOSIS — R8290 Unspecified abnormal findings in urine: Secondary | ICD-10-CM | POA: Insufficient documentation

## 2019-12-14 DIAGNOSIS — R519 Headache, unspecified: Secondary | ICD-10-CM

## 2019-12-14 DIAGNOSIS — I429 Cardiomyopathy, unspecified: Secondary | ICD-10-CM | POA: Diagnosis not present

## 2019-12-14 DIAGNOSIS — E7800 Pure hypercholesterolemia, unspecified: Secondary | ICD-10-CM | POA: Diagnosis not present

## 2019-12-14 DIAGNOSIS — F32A Depression, unspecified: Secondary | ICD-10-CM

## 2019-12-14 LAB — POC URINALSYSI DIPSTICK (AUTOMATED)
Bilirubin, UA: NEGATIVE
Glucose, UA: NEGATIVE
Ketones, UA: NEGATIVE
Nitrite, UA: POSITIVE
Protein, UA: NEGATIVE
Spec Grav, UA: 1.015 (ref 1.010–1.025)
Urobilinogen, UA: 0.2 E.U./dL
pH, UA: 6 (ref 5.0–8.0)

## 2019-12-14 MED ORDER — NITROFURANTOIN MONOHYD MACRO 100 MG PO CAPS: 100.0000 mg | ORAL_CAPSULE | Freq: Two times a day (BID) | ORAL | 0 refills | Status: AC

## 2019-12-14 MED ORDER — PROMETHAZINE HCL 12.5 MG PO TABS: ORAL_TABLET | ORAL | 0 refills | Status: DC

## 2019-12-14 NOTE — Progress Notes (Signed)
Subjective:    Patient ID: [name], female    DOB: 06-03-1945, 74 y.o.   MRN: [id number]  HPI  This visit occurred during the SARS-CoV-2 public health emergency.  Safety protocols were in place, including screening questions prior to the visit, additional usage of staff PPE, and extensive cleaning of exam room while observing appropriate contact time as indicated for disinfecting solutions.   [name] is a 74 year old female who presents today for complete physical.  She would also like to discuss foul smell to her urine. She denies urinary frequency, hematuria, pelvic pressure.   Immunizations: -Influenza: Due today -Shingles: Completed Shingrix  -Pneumonia: Completed Prevnar and Pneumovax  -Covid-19: Completed series  Diet: She endorses a fair diet. Exercise: She is walking some, active at home   Eye exam: Completes annually  Dental exam: Completes semi-annually   Mammogram: Completed in August 2021 Dexa: Completed in 2019 Colonoscopy: Completed in 2013 Hep C Screen: Negative  BP Readings from Last 3 Encounters:  12/14/19 124/64  11/04/19 126/68  11/03/19 (!) 125/59     Review of Systems  Constitutional: Negative for unexpected weight change.  HENT: Negative for rhinorrhea.   Respiratory: Negative for cough and shortness of breath.   Cardiovascular: Negative for chest pain.  Gastrointestinal: Negative for constipation and diarrhea.  Genitourinary: Negative for difficulty urinating.  Musculoskeletal: Positive for arthralgias.  Skin: Negative for rash.  Allergic/Immunologic: Negative for environmental allergies.  Neurological: Negative for dizziness, numbness and headaches.  Psychiatric/Behavioral: The patient is nervous/anxious.        Past Medical History:  Diagnosis Date  . Cardiomyopathy    nonischemic  . Chronic back pain   . Complication of anesthesia   . Erosive esophagitis    LA Classification Grade B  . Essential hypertension   . GERD  (gastroesophageal reflux disease)   . Hiatal hernia   . Hyperlipidemia   . Insomnia   . MDD (major depressive disorder)   . Melanoma ([place])    basil cell, pt reports no melanoma  . Migraine   . Osteopenia   . PONV (postoperative nausea and vomiting)   . Rosacea   . Sessile colonic polyp 10/2011   adenoma  . Squamous carcinoma    scalp     Social History   Socioeconomic History  . Marital status: Widowed    Spouse name: Not on file  . Number of children: 3  . Years of education: 59  . Highest education level: Not on file  Occupational History  . Not on file  Tobacco Use  . Smoking status: Former Smoker    Quit date: 06/03/1981    Years since quitting: 38.5  . Smokeless tobacco: Never Used  Vaping Use  . Vaping Use: Never used  Substance and Sexual Activity  . Alcohol use: Yes    Alcohol/week: 3.0 standard drinks    Types: 3 Glasses of wine per week    Comment: Wine per night  . Drug use: No  . Sexual activity: Not on file  Other Topics Concern  . Not on file  Social History Narrative   Lives alone in two-story home.     Her husband passed away unexpectedly.     They have three children- one is a pediatrician.   Desires CPR.   Caffeine use: Decaf coffee   Right handed   Social Determinants of Health   Financial Resource Strain:   . Difficulty of Paying Living Expenses: Not on file  Food Insecurity:   . Worried About Charity fundraiser in the Last Year: Not on file  . Ran Out of Food in the Last Year: Not on file  Transportation Needs:   . Lack of Transportation (Medical): Not on file  . Lack of Transportation (Non-Medical): Not on file  Physical Activity:   . Days of Exercise per Week: Not on file  . Minutes of Exercise per Session: Not on file  Stress:   . Feeling of Stress : Not on file  Social Connections:   . Frequency of Communication with Friends and Family: Not on file  . Frequency of Social Gatherings with Friends and Family: Not on file  .  Attends Religious Services: Not on file  . Active Member of Clubs or Organizations: Not on file  . Attends Archivist Meetings: Not on file  . Marital Status: Not on file  Intimate Partner Violence:   . Fear of Current or Ex-Partner: Not on file  . Emotionally Abused: Not on file  . Physically Abused: Not on file  . Sexually Abused: Not on file    Past Surgical History:  Procedure Laterality Date  . APPENDECTOMY    . CARDIAC CATHETERIZATION    . CATARACT EXTRACTION W/PHACO Right 03/18/2016   Procedure: CATARACT EXTRACTION PHACO AND INTRAOCULAR LENS PLACEMENT (IOC);  Surgeon: Birder [name], MD;  Location: [facility];  Service: Ophthalmology;  Laterality: Right;  [place] 45.3 AP% 22.2 CDE 10.07 Fluid pack lot # [id number]  . CATARACT EXTRACTION W/PHACO Left 04/08/2016   Procedure: CATARACT EXTRACTION PHACO AND INTRAOCULAR LENS PLACEMENT (IOC);  Surgeon: Birder [name], MD;  Location: [facility];  Service: Ophthalmology;  Laterality: Left;  [place] 00:41 AP% 15.0 CDE 6.27 Fluid pack lot # [id number]  . LOOP RECORDER INSERTION N/A 10/10/2016   Procedure: Loop Recorder Insertion;  Surgeon: Evans Lance, MD;  Location: [place] [facility];  Service: Cardiovascular;  Laterality: N/A;  . SKIN CANCER EXCISION     scalp  . TUBAL LIGATION    . VAGINAL HYSTERECTOMY      Family History  Problem Relation Age of Onset  . Alzheimer's disease Mother   . Heart disease Father   . Arthritis/Rheumatoid Sister   . Colon cancer Neg Hx   . Stomach cancer Neg Hx   . Rectal cancer Neg Hx   . Esophageal cancer Neg Hx     Allergies  Allergen Reactions  . Penicillin G Swelling    Rash,  . Penicillins Anaphylaxis and Hives    Has patient had a PCN reaction causing immediate rash, facial/tongue/throat swelling, SOB or lightheadedness with hypotension: {Yes Has patient had a PCN reaction causing severe rash involving mucus membranes or skin necrosis: {no Has patient had a PCN reaction that required  hospitalization {yes Has patient had a PCN reaction occurring within the last 10 years: {no If all of the above answers are "NO", then may proceed with Cephalosporin use.  . Clindamycin Itching    rash  . Lansoprazole Other (See Comments)    REACTION: headaches  . Lincomycin Itching    rash  . Sulfa Antibiotics Hives  . Sulfasalazine Hives  . Cefaclor Itching and Rash    rash  . Cefuroxime Axetil Rash and Itching    rash  . Clindamycin/Lincomycin Itching and Rash  . Latex Itching and Rash    rash  . Lincomycin Hcl Itching and Rash  . Sulfamethoxazole-Trimethoprim Rash    Current Outpatient Medications on File Prior  to Visit  Medication Sig Dispense Refill  . acetaminophen (TYLENOL) 500 MG tablet Take 1,000 mg by mouth every 6 (six) hours as needed for moderate pain or headache.    Marland Kitchen amLODipine (NORVASC) 2.5 MG tablet Take 1 tablet (2.5 mg total) by mouth daily. 90 tablet 3  . carvedilol (COREG) 12.5 MG tablet Take 1 tablet (12.5 mg total) by mouth 2 (two) times daily with a meal. TAKE ONE (1) TABLET BY MOUTH TWO TIMES PER DAY WITH A MEAL 180 tablet 3  . Cholecalciferol (VITAMIN D3) 2000 units capsule Take 2,000 Units by mouth daily.    . enalapril (VASOTEC) 10 MG tablet Take 1 tablet (10 mg total) by mouth daily. 90 tablet 3  . escitalopram (LEXAPRO) 20 MG tablet Take 1 tablet (20 mg total) by mouth daily. For anxiety and depression. 90 tablet 2  . Multiple Vitamin (MULTIVITAMIN) capsule Take 1 capsule by mouth daily.      . nitroGLYCERIN (NITROSTAT) 0.4 MG SL tablet Place 1 tablet (0.4 mg total) under the tongue every 5 (five) minutes as needed for chest pain. 30 tablet 3  . pantoprazole (PROTONIX) 40 MG tablet Take 1 tablet (40 mg total) by mouth 2 (two) times daily. For heartburn 180 tablet 3  . Polyvinyl Alcohol-Povidone (REFRESH OP) Apply to eye.    . rosuvastatin (CRESTOR) 20 MG tablet Take 1 tablet (20 mg total) by mouth daily. For cholesterol. 90 tablet 3  . SUPREP BOWEL  PREP KIT 17.5-3.13-1.6 GM/177ML SOLN TAKE AS DIRECTED BY MOUTH ONCE FOR 1 DOSE    . zolpidem (AMBIEN) 10 MG tablet TAKE ONE TABLET AT BEDTIME AS NEEDED FORSLEEP 90 tablet 0   No current facility-administered medications on file prior to visit.    BP 124/64   Pulse 73   Temp 97.6 F (36.4 C) (Temporal)   Ht _0  (1.575 m)   Wt 132 lb (59.9 kg)   SpO2 94%   BMI 24.14 kg/m    Objective:   Physical Exam HENT:     Right Ear: Tympanic membrane and ear canal normal.     Left Ear: Tympanic membrane and ear canal normal.  Eyes:     Pupils: Pupils are equal, round, and reactive to light.  Cardiovascular:     Rate and Rhythm: Normal rate and regular rhythm.  Pulmonary:     Effort: Pulmonary effort is normal.     Breath sounds: Normal breath sounds.  Abdominal:     General: Bowel sounds are normal.     Palpations: Abdomen is soft.     Tenderness: There is no abdominal tenderness.  Musculoskeletal:        General: Normal range of motion.     Cervical back: Neck supple.  Skin:    General: Skin is warm and dry.  Neurological:     Mental Status: She is alert and oriented to person, place, and time.     Cranial Nerves: No cranial nerve deficit.     Deep Tendon Reflexes:     Reflex Scores:      Patellar reflexes are 2+ on the right side and 2+ on the left side. Psychiatric:        Mood and Affect: Mood normal.            Assessment & Plan:

## 2019-12-14 NOTE — Addendum Note (Signed)
Addended by: [name] on: 12/14/2019 04:57 PM   Modules accepted: Orders

## 2019-12-14 NOTE — Assessment & Plan Note (Signed)
Feels well managed on Lexapro 20 mg, continue same.

## 2019-12-14 NOTE — Assessment & Plan Note (Signed)
Immunizations UTD. Mammogram UTD. Bone density scan due, she will get next year with mammogram.  Colon cancer screening UTD.   Encouraged a healthy diet, regular exercise. Exam today unremarkable. Labs reviewed.

## 2019-12-14 NOTE — Patient Instructions (Signed)
Start Macrobid (nitrofurantoin) tablets for urinary tract infection. Take 1 tablet by mouth twice daily for five days.  Continue to work on a healthy diet, remain active daily.  Ensure you are consuming 64 ounces of water daily.  It was a pleasure to see you today!   Preventive Care 38 Years and Older, Female Preventive care refers to lifestyle choices and visits with your health care provider that can promote health and wellness. This includes:  A yearly physical exam. This is also called an annual well check.  Regular dental and eye exams.  Immunizations.  Screening for certain conditions.  Healthy lifestyle choices, such as diet and exercise. What can I expect for my preventive care visit? Physical exam Your health care provider will check:  Height and weight. These may be used to calculate body mass index (BMI), which is a measurement that tells if you are at a healthy weight.  Heart rate and blood pressure.  Your skin for abnormal spots. Counseling Your health care provider may ask you questions about:  Alcohol, tobacco, and drug use.  Emotional well-being.  Home and relationship well-being.  Sexual activity.  Eating habits.  History of falls.  Memory and ability to understand (cognition).  Work and work Statistician.  Pregnancy and menstrual history. What immunizations do I need?  Influenza (flu) vaccine  This is recommended every year. Tetanus, diphtheria, and pertussis (Tdap) vaccine  You may need a Td booster every 10 years. Varicella (chickenpox) vaccine  You may need this vaccine if you have not already been vaccinated. Zoster (shingles) vaccine  You may need this after age 66. Pneumococcal conjugate (PCV13) vaccine  One dose is recommended after age 69. Pneumococcal polysaccharide (PPSV23) vaccine  One dose is recommended after age 23. Measles, mumps, and rubella (MMR) vaccine  You may need at least one dose of MMR if you were born in  1958 or later. You may also need a second dose. Meningococcal conjugate (MenACWY) vaccine  You may need this if you have certain conditions. Hepatitis A vaccine  You may need this if you have certain conditions or if you travel or work in places where you may be exposed to hepatitis A. Hepatitis B vaccine  You may need this if you have certain conditions or if you travel or work in places where you may be exposed to hepatitis B. Haemophilus influenzae type b (Hib) vaccine  You may need this if you have certain conditions. You may receive vaccines as individual doses or as more than one vaccine together in one shot (combination vaccines). Talk with your health care provider about the risks and benefits of combination vaccines. What tests do I need? Blood tests  Lipid and cholesterol levels. These may be checked every 5 years, or more frequently depending on your overall health.  Hepatitis C test.  Hepatitis B test. Screening  Lung cancer screening. You may have this screening every year starting at age 27 if you have a 30-pack-year history of smoking and currently smoke or have quit within the past 15 years.  Colorectal cancer screening. All adults should have this screening starting at age 37 and continuing until age 9. Your health care provider may recommend screening at age 55 if you are at increased risk. You will have tests every 1-10 years, depending on your results and the type of screening test.  Diabetes screening. This is done by checking your blood sugar (glucose) after you have not eaten for a while (fasting). You may  have this done every 1-3 years.  Mammogram. This may be done every 1-2 years. Talk with your health care provider about how often you should have regular mammograms.  BRCA-related cancer screening. This may be done if you have a family history of breast, ovarian, tubal, or peritoneal cancers. Other tests  Sexually transmitted disease (STD) testing.  Bone  density scan. This is done to screen for osteoporosis. You may have this done starting at age 39. Follow these instructions at home: Eating and drinking  Eat a diet that includes fresh fruits and vegetables, whole grains, lean protein, and low-fat dairy products. Limit your intake of foods with high amounts of sugar, saturated fats, and salt.  Take vitamin and mineral supplements as recommended by your health care provider.  Do not drink alcohol if your health care provider tells you not to drink.  If you drink alcohol: ? Limit how much you have to 0-1 drink a day. ? Be aware of how much alcohol is in your drink. In the [place] one drink equals one 12 oz bottle of beer (355 mL), one 5 oz glass of wine (148 mL), or one 1 oz glass of hard liquor (44 mL). Lifestyle  Take daily care of your teeth and gums.  Stay active. Exercise for at least 30 minutes on 5 or more days each week.  Do not use any products that contain nicotine or tobacco, such as cigarettes, e-cigarettes, and chewing tobacco. If you need help quitting, ask your health care provider.  If you are sexually active, practice safe sex. Use a condom or other form of protection in order to prevent STIs (sexually transmitted infections).  Talk with your health care provider about taking a low-dose aspirin or statin. What's next?  Go to your health care provider once a year for a well check visit.  Ask your health care provider how often you should have your eyes and teeth checked.  Stay up to date on all vaccines. This information is not intended to replace advice given to you by your health care provider. Make sure you discuss any questions you have with your health care provider. Document Revised: 02/18/2018 Document Reviewed: 02/18/2018 Elsevier Patient Education  2020 [employer].

## 2019-12-14 NOTE — Assessment & Plan Note (Signed)
Follows with cardiology and electrophysiology. Continue carvedilol and BP control.

## 2019-12-14 NOTE — Assessment & Plan Note (Signed)
Stable on rosuvastatin per recent lipid panel, continue same.

## 2019-12-14 NOTE — Assessment & Plan Note (Signed)
Acute for the last several weeks. Last month urinalysis and culture negative.  Today UA with 2+ leuks, positive nitrites. Culture sent.  Given evidence of nitrites without taking AZO, along with leuks, will treat for presumed infection.  Prescription for Macrobid course sent to pharmacy.  Await culture results.

## 2019-12-14 NOTE — Assessment & Plan Note (Signed)
Compliant to pantoprazole BID, continue same.

## 2019-12-14 NOTE — Assessment & Plan Note (Addendum)
Overall doing well on Ambien, so side effects, continue same.

## 2019-12-14 NOTE — Assessment & Plan Note (Signed)
Compliant to and doing well on pantoprazole BID, continue same.

## 2019-12-16 LAB — URINE CULTURE
MICRO NUMBER:: 11038987
SPECIMEN QUALITY:: ADEQUATE

## 2019-12-23 DIAGNOSIS — L718 Other rosacea: Secondary | ICD-10-CM | POA: Diagnosis not present

## 2019-12-23 DIAGNOSIS — D2261 Melanocytic nevi of right upper limb, including shoulder: Secondary | ICD-10-CM | POA: Diagnosis not present

## 2019-12-23 DIAGNOSIS — D0471 Carcinoma in situ of skin of right lower limb, including hip: Secondary | ICD-10-CM | POA: Diagnosis not present

## 2019-12-28 ENCOUNTER — Ambulatory Visit (INDEPENDENT_AMBULATORY_CARE_PROVIDER_SITE_OTHER): Payer: Medicare HMO

## 2019-12-28 DIAGNOSIS — R55 Syncope and collapse: Secondary | ICD-10-CM

## 2019-12-31 LAB — CUP PACEART REMOTE DEVICE CHECK
Date Time Interrogation Session: 20211019233245
Implantable Pulse Generator Implant Date: 20180803

## 2020-01-03 NOTE — Progress Notes (Signed)
Carelink Summary Report / Loop Recorder 

## 2020-01-10 DIAGNOSIS — L570 Actinic keratosis: Secondary | ICD-10-CM | POA: Diagnosis not present

## 2020-01-17 DIAGNOSIS — L570 Actinic keratosis: Secondary | ICD-10-CM | POA: Diagnosis not present

## 2020-01-17 DIAGNOSIS — D485 Neoplasm of uncertain behavior of skin: Secondary | ICD-10-CM | POA: Diagnosis not present

## 2020-01-17 DIAGNOSIS — D0461 Carcinoma in situ of skin of right upper limb, including shoulder: Secondary | ICD-10-CM | POA: Diagnosis not present

## 2020-01-17 DIAGNOSIS — X32XXXA Exposure to sunlight, initial encounter: Secondary | ICD-10-CM | POA: Diagnosis not present

## 2020-01-30 ENCOUNTER — Ambulatory Visit (INDEPENDENT_AMBULATORY_CARE_PROVIDER_SITE_OTHER): Payer: Medicare HMO

## 2020-01-30 DIAGNOSIS — R55 Syncope and collapse: Secondary | ICD-10-CM

## 2020-01-30 LAB — CUP PACEART REMOTE DEVICE CHECK
Date Time Interrogation Session: 20211119223555
Implantable Pulse Generator Implant Date: 20180803

## 2020-02-01 NOTE — Progress Notes (Signed)
Carelink Summary Report / Loop Recorder 

## 2020-02-13 ENCOUNTER — Other Ambulatory Visit: Payer: Self-pay | Admitting: Primary Care

## 2020-02-13 DIAGNOSIS — G4700 Insomnia, unspecified: Secondary | ICD-10-CM

## 2020-02-13 NOTE — Telephone Encounter (Signed)
Please Advise

## 2020-02-13 NOTE — Telephone Encounter (Signed)
Refills sent to pharmacy. 

## 2020-02-21 DIAGNOSIS — C44622 Squamous cell carcinoma of skin of right upper limb, including shoulder: Secondary | ICD-10-CM | POA: Diagnosis not present

## 2020-02-21 DIAGNOSIS — D0461 Carcinoma in situ of skin of right upper limb, including shoulder: Secondary | ICD-10-CM | POA: Diagnosis not present

## 2020-02-22 DIAGNOSIS — M25562 Pain in left knee: Secondary | ICD-10-CM | POA: Diagnosis not present

## 2020-02-28 ENCOUNTER — Ambulatory Visit (INDEPENDENT_AMBULATORY_CARE_PROVIDER_SITE_OTHER): Payer: Medicare HMO

## 2020-02-28 DIAGNOSIS — R55 Syncope and collapse: Secondary | ICD-10-CM | POA: Diagnosis not present

## 2020-02-28 LAB — CUP PACEART REMOTE DEVICE CHECK
Date Time Interrogation Session: 20211220223405
Implantable Pulse Generator Implant Date: 20180803

## 2020-03-14 NOTE — Progress Notes (Signed)
Carelink Summary Report / Loop Recorder 

## 2020-03-23 ENCOUNTER — Other Ambulatory Visit: Payer: Self-pay | Admitting: Gastroenterology

## 2020-03-23 DIAGNOSIS — K2100 Gastro-esophageal reflux disease with esophagitis, without bleeding: Secondary | ICD-10-CM

## 2020-03-30 ENCOUNTER — Ambulatory Visit (INDEPENDENT_AMBULATORY_CARE_PROVIDER_SITE_OTHER): Payer: Medicare HMO

## 2020-03-30 DIAGNOSIS — R55 Syncope and collapse: Secondary | ICD-10-CM | POA: Diagnosis not present

## 2020-03-30 LAB — CUP PACEART REMOTE DEVICE CHECK
Date Time Interrogation Session: 20220120223817
Implantable Pulse Generator Implant Date: 20180803

## 2020-04-02 ENCOUNTER — Other Ambulatory Visit: Payer: Self-pay | Admitting: Primary Care

## 2020-04-02 DIAGNOSIS — F331 Major depressive disorder, recurrent, moderate: Secondary | ICD-10-CM

## 2020-04-03 DIAGNOSIS — H5203 Hypermetropia, bilateral: Secondary | ICD-10-CM | POA: Diagnosis not present

## 2020-04-10 DIAGNOSIS — L719 Rosacea, unspecified: Secondary | ICD-10-CM | POA: Diagnosis not present

## 2020-04-11 NOTE — Progress Notes (Signed)
Carelink Summary Report / Loop Recorder 

## 2020-04-25 DIAGNOSIS — D485 Neoplasm of uncertain behavior of skin: Secondary | ICD-10-CM | POA: Diagnosis not present

## 2020-04-25 DIAGNOSIS — C44319 Basal cell carcinoma of skin of other parts of face: Secondary | ICD-10-CM | POA: Diagnosis not present

## 2020-04-30 ENCOUNTER — Ambulatory Visit (INDEPENDENT_AMBULATORY_CARE_PROVIDER_SITE_OTHER): Payer: Medicare HMO

## 2020-04-30 DIAGNOSIS — I429 Cardiomyopathy, unspecified: Secondary | ICD-10-CM

## 2020-04-30 LAB — CUP PACEART REMOTE DEVICE CHECK
Date Time Interrogation Session: 20220220224321
Implantable Pulse Generator Implant Date: 20180803

## 2020-05-08 NOTE — Progress Notes (Signed)
Carelink Summary Report / Loop Recorder 

## 2020-05-09 ENCOUNTER — Other Ambulatory Visit: Payer: Self-pay | Admitting: Primary Care

## 2020-05-09 DIAGNOSIS — G4700 Insomnia, unspecified: Secondary | ICD-10-CM

## 2020-05-14 DIAGNOSIS — G5601 Carpal tunnel syndrome, right upper limb: Secondary | ICD-10-CM | POA: Diagnosis not present

## 2020-05-14 DIAGNOSIS — D219 Benign neoplasm of connective and other soft tissue, unspecified: Secondary | ICD-10-CM | POA: Diagnosis not present

## 2020-05-21 ENCOUNTER — Telehealth: Payer: Self-pay

## 2020-05-21 NOTE — Telephone Encounter (Signed)
Successful telephone encounter to [name] after [facility] alert received for device RRT 05/20/20. Patient states she would like to have LINQ device removed. All questions answered to patient's satisfaction. Address confirmed and monitor return kit requested. All remote transmission appointments discontinued and patient removed from [facility]. Will send to scheduling.  

## 2020-06-05 DIAGNOSIS — L905 Scar conditions and fibrosis of skin: Secondary | ICD-10-CM | POA: Diagnosis not present

## 2020-06-05 DIAGNOSIS — X32XXXA Exposure to sunlight, initial encounter: Secondary | ICD-10-CM | POA: Diagnosis not present

## 2020-06-05 DIAGNOSIS — L570 Actinic keratosis: Secondary | ICD-10-CM | POA: Diagnosis not present

## 2020-06-11 DIAGNOSIS — L578 Other skin changes due to chronic exposure to nonionizing radiation: Secondary | ICD-10-CM | POA: Diagnosis not present

## 2020-06-11 DIAGNOSIS — L988 Other specified disorders of the skin and subcutaneous tissue: Secondary | ICD-10-CM | POA: Diagnosis not present

## 2020-06-11 DIAGNOSIS — L814 Other melanin hyperpigmentation: Secondary | ICD-10-CM | POA: Diagnosis not present

## 2020-06-11 DIAGNOSIS — C44319 Basal cell carcinoma of skin of other parts of face: Secondary | ICD-10-CM | POA: Diagnosis not present

## 2020-06-20 ENCOUNTER — Other Ambulatory Visit: Payer: Self-pay | Admitting: Primary Care

## 2020-06-20 DIAGNOSIS — R519 Headache, unspecified: Secondary | ICD-10-CM

## 2020-06-20 NOTE — Telephone Encounter (Signed)
Refills sent to pharmacy. 

## 2020-06-20 NOTE — Telephone Encounter (Signed)
Refill request Promethazine Last refill 12/14/19 #20 Last office visit 12/14/19 No upcoming appointment scheduled

## 2020-06-26 ENCOUNTER — Other Ambulatory Visit: Payer: Self-pay | Admitting: Internal Medicine

## 2020-07-03 ENCOUNTER — Encounter: Payer: Self-pay | Admitting: Gastroenterology

## 2020-07-03 ENCOUNTER — Ambulatory Visit: Payer: Medicare HMO | Admitting: Gastroenterology

## 2020-07-03 VITALS — BP 110/70 | HR 70 | Ht 62.0 in | Wt 130.0 lb

## 2020-07-03 DIAGNOSIS — K2100 Gastro-esophageal reflux disease with esophagitis, without bleeding: Secondary | ICD-10-CM

## 2020-07-03 DIAGNOSIS — Z86010 Personal history of colonic polyps: Secondary | ICD-10-CM

## 2020-07-03 DIAGNOSIS — I428 Other cardiomyopathies: Secondary | ICD-10-CM | POA: Diagnosis not present

## 2020-07-03 MED ORDER — DEXLANSOPRAZOLE 60 MG PO CPDR: 60.0000 mg | DELAYED_RELEASE_CAPSULE | Freq: Every day | ORAL | 11 refills | Status: DC

## 2020-07-03 NOTE — Patient Instructions (Signed)
Stop taking pantoprazole.   We have sent the following medications to your pharmacy for you to pick up at your convenience: [place].   You have been scheduled for an endoscopy. Please follow written instructions given to you at your visit today. If you use inhalers (even only as needed), please bring them with you on the day of your procedure.  Normal BMI (Body Mass Index- based on height and weight) is between 23 and 30. Your BMI today is Body mass index is 23.78 kg/m. Marland Kitchen Please consider follow up  regarding your BMI with your Primary Care Provider.  Thank you for choosing me and [facility].  Pricilla Riffle. Dagoberto Ligas., MD., Marval Regal

## 2020-07-03 NOTE — Progress Notes (Signed)
    History of Present Illness: This is a 75 year old female with GERD with LA Class B esophagitis with worsening reflux symptoms.  She relates several months of more frequent episodes of heartburn.  She has been taking Pepcid once a day in addition to pantoprazole twice daily.  No change in diet, activities or other medications.  No recent antibiotic usage.  Current Medications, Allergies, Past Medical History, Past Surgical History, Family History and Social History were reviewed in [employer] record.   Physical Exam: General: Well developed, well nourished, no acute distress Head: Normocephalic and atraumatic Eyes: Sclerae anicteric, EOMI Ears: Normal auditory acuity Mouth: Not examined, mask on during Covid-19 pandemic Lungs: Clear throughout to auscultation Heart: Regular rate and rhythm; no murmurs, rubs or bruits Abdomen: Soft, non tender and non distended. No masses, hepatosplenomegaly or hernias noted. Normal Bowel sounds Rectal: Deferred to colonoscopy Musculoskeletal: Symmetrical with no gross deformities  Pulses:  Normal pulses noted Extremities: No clubbing, cyanosis, edema or deformities noted Neurological: Alert oriented x 4, grossly nonfocal Psychological:  Alert and cooperative. Normal mood and affect   Assessment and Recommendations:  1. GERD with LA Class B esophagitis.  Reflux symptoms are not adequately controlled.  Closely follow antireflux measures.  Change to Dexilant 60 mg p.o. every morning.  Increase Pepcid to 20 mg p.o. twice daily.  Schedule EGD. The risks (including bleeding, perforation, infection, missed lesions, medication reactions and possible hospitalization or surgery if complications occur), benefits, and alternatives to endoscopy with possible biopsy and possible dilation were discussed with the patient and they consent to proceed.   2. Personal history of a sessile serrated adenoma in 2013. She is overdue for surveillance  colonoscopy.  She she has canceled colonoscopy previously.  She notes that she is now 84 and that maybe she does not need to proceed with another colonoscopy.  I advised her given her history of a sessile serrated adenoma and good health I recommend proceeding with colonoscopy however she declines.  3. Cardiomyopathy, nonischemic, with EF=50-55% in March 2021

## 2020-07-07 ENCOUNTER — Other Ambulatory Visit: Payer: Self-pay | Admitting: Gastroenterology

## 2020-07-07 DIAGNOSIS — K2100 Gastro-esophageal reflux disease with esophagitis, without bleeding: Secondary | ICD-10-CM

## 2020-07-08 HISTORY — PX: LOOP RECORDER REMOVAL: EP1215

## 2020-07-11 ENCOUNTER — Encounter: Payer: Self-pay | Admitting: Internal Medicine

## 2020-07-11 ENCOUNTER — Other Ambulatory Visit: Payer: Self-pay

## 2020-07-11 ENCOUNTER — Ambulatory Visit (INDEPENDENT_AMBULATORY_CARE_PROVIDER_SITE_OTHER): Payer: Medicare HMO | Admitting: Internal Medicine

## 2020-07-11 VITALS — BP 126/64 | HR 66 | Ht 62.0 in | Wt 130.0 lb

## 2020-07-11 DIAGNOSIS — R55 Syncope and collapse: Secondary | ICD-10-CM | POA: Diagnosis not present

## 2020-07-11 NOTE — Progress Notes (Signed)
HPI [name] returns today for followup. She is a pleasant 75 yo woman with syncope and LBBB, who underwent ILR insertion over 3 years ago. She has most recently had preserved LV function by echo. She has had asymptomatic NSVT. She has reached RRT on her device and presents today for removal. She denies chest pain or sob or peripheral edema.  Allergies  Allergen Reactions  . Penicillin G Swelling    Rash,  . Penicillins Anaphylaxis and Hives    Has patient had a PCN reaction causing immediate rash, facial/tongue/throat swelling, SOB or lightheadedness with hypotension: {Yes Has patient had a PCN reaction causing severe rash involving mucus membranes or skin necrosis: {no Has patient had a PCN reaction that required hospitalization {yes Has patient had a PCN reaction occurring within the last 10 years: {no If all of the above answers are "NO", then may proceed with Cephalosporin use.  . Clindamycin Itching    rash  . Lansoprazole Other (See Comments)    REACTION: headaches  . Lincomycin Itching    rash  . Sulfa Antibiotics Hives  . Sulfasalazine Hives  . Cefaclor Itching and Rash    rash  . Cefuroxime Axetil Rash and Itching    rash  . Clindamycin/Lincomycin Itching and Rash  . Latex Itching and Rash    rash  . Lincomycin Hcl Itching and Rash  . Sulfamethoxazole-Trimethoprim Rash     Current Outpatient Medications  Medication Sig Dispense Refill  . acetaminophen (TYLENOL) 500 MG tablet Take 1,000 mg by mouth every 6 (six) hours as needed for moderate pain or headache.    Marland Kitchen amLODipine (NORVASC) 2.5 MG tablet Take 1 tablet (2.5 mg total) by mouth daily. 90 tablet 3  . carvedilol (COREG) 12.5 MG tablet Take 1 tablet (12.5 mg total) by mouth 2 (two) times daily with a meal. Please make overdue appt with Dr. Lovena Le before anymore refills. Thank you 1st Attempt 60 tablet 0  . Cholecalciferol (VITAMIN D3) 2000 units capsule Take 2,000 Units by mouth daily.    . enalapril  (VASOTEC) 10 MG tablet Take 1 tablet (10 mg total) by mouth daily. 90 tablet 3  . escitalopram (LEXAPRO) 20 MG tablet Take 1 tablet (20 mg total) by mouth daily. FOR ANXIETY. 90 tablet 2  . Multiple Vitamin (MULTIVITAMIN) capsule Take 1 capsule by mouth daily.    . nitroGLYCERIN (NITROSTAT) 0.4 MG SL tablet Place 1 tablet (0.4 mg total) under the tongue every 5 (five) minutes as needed for chest pain. 30 tablet 3  . pantoprazole (PROTONIX) 40 MG tablet TAKE ONE TABLET BY MOUTH TWICE DAILY AS NEEDED FOR HEARTBURN 180 tablet 0  . Polyvinyl Alcohol-Povidone (REFRESH OP) Apply to eye.    . promethazine (PHENERGAN) 12.5 MG tablet TAKE ONE TABLET BY MOUTH TWICE DAILY AS NEEDED FOR NAUSEA OR VOMITING 20 tablet 0  . rosuvastatin (CRESTOR) 20 MG tablet Take 1 tablet (20 mg total) by mouth daily. For cholesterol. 90 tablet 3  . SUPREP BOWEL PREP KIT 17.5-3.13-1.6 GM/177ML SOLN TAKE AS DIRECTED BY MOUTH ONCE FOR 1 DOSE    . zolpidem (AMBIEN) 10 MG tablet TAKE ONE TABLET AT BEDTIME IF NEEDED FORSLEEP 90 tablet 0   No current facility-administered medications for this visit.     Past Medical History:  Diagnosis Date  . Cardiomyopathy    nonischemic  . Chronic back pain   . Complication of anesthesia   . Erosive esophagitis    LA Classification Grade  B  . Essential hypertension   . GERD (gastroesophageal reflux disease)   . Hiatal hernia   . Hyperlipidemia   . Insomnia   . MDD (major depressive disorder)   . Melanoma ([place])    basil cell, pt reports no melanoma  . Migraine   . Osteopenia   . PONV (postoperative nausea and vomiting)   . Rosacea   . Sessile colonic polyp 10/2011   adenoma  . Squamous carcinoma    scalp    ROS:   All systems reviewed and negative except as noted in the HPI.   Past Surgical History:  Procedure Laterality Date  . APPENDECTOMY    . CARDIAC CATHETERIZATION    . CATARACT EXTRACTION W/PHACO Right 03/18/2016   Procedure: CATARACT EXTRACTION PHACO AND  INTRAOCULAR LENS PLACEMENT (IOC);  Surgeon: Birder [name], MD;  Location: [facility] ORS;  Service: Ophthalmology;  Laterality: Right;  [place] 45.3 AP% 22.2 CDE 10.07 Fluid pack lot # [id number]  . CATARACT EXTRACTION W/PHACO Left 04/08/2016   Procedure: CATARACT EXTRACTION PHACO AND INTRAOCULAR LENS PLACEMENT (IOC);  Surgeon: Birder [name], MD;  Location: [facility] ORS;  Service: Ophthalmology;  Laterality: Left;  [place] 00:41 AP% 15.0 CDE 6.27 Fluid pack lot # [id number]  . LOOP RECORDER INSERTION N/A 10/10/2016   Procedure: Loop Recorder Insertion;  Surgeon: Evans Lance, MD;  Location: [facility];  Service: Cardiovascular;  Laterality: N/A;  . SKIN CANCER EXCISION     scalp  . TUBAL LIGATION    . VAGINAL HYSTERECTOMY       Family History  Problem Relation Age of Onset  . Alzheimer's disease Mother   . Heart disease Father   . Arthritis/Rheumatoid Sister   . Colon cancer Neg Hx   . Stomach cancer Neg Hx   . Rectal cancer Neg Hx   . Esophageal cancer Neg Hx      Social History   Socioeconomic History  . Marital status: Widowed    Spouse name: Not on file  . Number of children: 3  . Years of education: 98  . Highest education level: Not on file  Occupational History  . Not on file  Tobacco Use  . Smoking status: Former Smoker    Quit date: 06/03/1981    Years since quitting: 39.1  . Smokeless tobacco: Never Used  Vaping Use  . Vaping Use: Never used  Substance and Sexual Activity  . Alcohol use: Yes    Alcohol/week: 3.0 standard drinks    Types: 3 Glasses of wine per week    Comment: Wine per night  . Drug use: No  . Sexual activity: Not Currently  Other Topics Concern  . Not on file  Social History Narrative   Lives alone in two-story home.     Her husband passed away unexpectedly.     They have three children- one is a pediatrician.   Desires CPR.   Caffeine use: Decaf coffee   Right handed   Social Determinants of Health   Financial Resource Strain: Not on  file  Food Insecurity: Not on file  Transportation Needs: Not on file  Physical Activity: Not on file  Stress: Not on file  Social Connections: Not on file  Intimate Partner Violence: Not on file     BP 126/64   Pulse 66   Ht '5\' 2"'  (1.575 m)   Wt 130 lb (59 kg)   SpO2 98%   BMI 23.78 kg/m   Physical Exam:  Well appearing  NAD HEENT: Unremarkable Neck:  No JVD, no thyromegally Lymphatics:  No adenopathy Back:  No CVA tenderness Lungs:  Clear with no wheezes HEART:  Regular rate rhythm, no murmurs, no rubs, no clicks Abd:  soft, positive bowel sounds, no organomegally, no rebound, no guarding Ext:  2 plus pulses, no edema, no cyanosis, no clubbing Skin:  No rashes no nodules Neuro:  CN II through XII intact, motor grossly intact    DEVICE  Normal device function.  See PaceArt for details. No arrhythmias  Assess/Plan: 1. Unexplained syncope - she has had no recurrent episodes since her ILR placement over 3 years ago and will undergo watchful waiting.  2. ILR - her device has reached RRT and she would like to have it removed. I have explained the procedure to remove the device and she would like to proceed.  EP procedure Note  Procedure performed: ILR removal  Preoperative diagnosis: unexplained syncope.  Postoperative diagnosis: same as preoperative diagnosis  Description of the procedure: after informed consent was obtained the patient was prepped and draped in a sterile fashion. 10 cc of lidocaine was infiltrated into the left pectoral region. A one cm incision was carried out. A combination of blunt and sharp dissection was utilized to grasp the ILR. The device was removed with gentle traction. Benzoin and steristrips were painted on the skin and a bandage was applied and the patient recovered in the usual manner.  Complications: none immediately  Conclusion: successful ILR removal in a patient with a h/o syncope.  [name] Sarah Deamer,MD

## 2020-07-11 NOTE — Patient Instructions (Signed)
Medication Instructions:  Your physician recommends that you continue on your current medications as directed. Please refer to the Current Medication list given to you today.  Labwork: None ordered.  Testing/Procedures: None ordered.  Follow-Up:  Your physician wants you to follow-up in: as needed with Dr. Taylor   Implantable Loop Recorder Removal, Care After This sheet gives you information about how to care for yourself after your procedure. Your health care provider may also give you more specific instructions. If you have problems or questions, contact your health care provider. What can I expect after the procedure? After the procedure, it is common to have:  Soreness or discomfort near the incision.  Some swelling or bruising near the incision.  Follow these instructions at home: Incision care  1.  Leave your outer dressing on for 72 hours.  After 72 hours you can remove your outer dressing and shower. 2. Leave adhesive strips in place. These skin closures may need to stay in place for 1-2 weeks. If adhesive strip edges start to loosen and curl up, you may trim the loose edges.  You may remove the strips if they have not fallen off after 2 weeks. 3. Check your incision area every day for signs of infection. Check for: a. Redness, swelling, or pain. b. Fluid or blood. c. Warmth. d. Pus or a bad smell. 4. Do not take baths, swim, or use a hot tub until your incision is completely healed. 5. If your wound site starts to bleed apply pressure.      If you have any questions/concerns please call the device clinic at [phone].  Activity  Return to your normal activities.  Contact a health care provider if:  You have redness, swelling, or pain around your incision.  You have a fever.  

## 2020-07-12 ENCOUNTER — Other Ambulatory Visit: Payer: Self-pay | Admitting: Internal Medicine

## 2020-07-12 DIAGNOSIS — Z85828 Personal history of other malignant neoplasm of skin: Secondary | ICD-10-CM | POA: Diagnosis not present

## 2020-07-12 DIAGNOSIS — L821 Other seborrheic keratosis: Secondary | ICD-10-CM | POA: Diagnosis not present

## 2020-07-12 DIAGNOSIS — D485 Neoplasm of uncertain behavior of skin: Secondary | ICD-10-CM | POA: Diagnosis not present

## 2020-07-12 DIAGNOSIS — X32XXXA Exposure to sunlight, initial encounter: Secondary | ICD-10-CM | POA: Diagnosis not present

## 2020-07-12 DIAGNOSIS — D2272 Melanocytic nevi of left lower limb, including hip: Secondary | ICD-10-CM | POA: Diagnosis not present

## 2020-07-12 DIAGNOSIS — L565 Disseminated superficial actinic porokeratosis (DSAP): Secondary | ICD-10-CM | POA: Diagnosis not present

## 2020-07-12 DIAGNOSIS — E7800 Pure hypercholesterolemia, unspecified: Secondary | ICD-10-CM

## 2020-07-12 DIAGNOSIS — D225 Melanocytic nevi of trunk: Secondary | ICD-10-CM | POA: Diagnosis not present

## 2020-07-12 DIAGNOSIS — L570 Actinic keratosis: Secondary | ICD-10-CM | POA: Diagnosis not present

## 2020-07-17 ENCOUNTER — Encounter: Payer: Self-pay | Admitting: Gastroenterology

## 2020-07-19 DIAGNOSIS — L570 Actinic keratosis: Secondary | ICD-10-CM | POA: Diagnosis not present

## 2020-07-23 DIAGNOSIS — T8189XA Other complications of procedures, not elsewhere classified, initial encounter: Secondary | ICD-10-CM | POA: Diagnosis not present

## 2020-07-23 DIAGNOSIS — Z85828 Personal history of other malignant neoplasm of skin: Secondary | ICD-10-CM | POA: Diagnosis not present

## 2020-07-24 DIAGNOSIS — M1712 Unilateral primary osteoarthritis, left knee: Secondary | ICD-10-CM | POA: Diagnosis not present

## 2020-07-25 ENCOUNTER — Ambulatory Visit (AMBULATORY_SURGERY_CENTER): Payer: Medicare HMO | Admitting: Gastroenterology

## 2020-07-25 ENCOUNTER — Other Ambulatory Visit: Payer: Self-pay

## 2020-07-25 ENCOUNTER — Encounter: Payer: Self-pay | Admitting: Gastroenterology

## 2020-07-25 VITALS — BP 131/66 | HR 68 | Temp 96.8°F | Resp 27 | Ht 62.0 in | Wt 130.0 lb

## 2020-07-25 DIAGNOSIS — K219 Gastro-esophageal reflux disease without esophagitis: Secondary | ICD-10-CM | POA: Diagnosis not present

## 2020-07-25 DIAGNOSIS — K229 Disease of esophagus, unspecified: Secondary | ICD-10-CM

## 2020-07-25 DIAGNOSIS — K2100 Gastro-esophageal reflux disease with esophagitis, without bleeding: Secondary | ICD-10-CM

## 2020-07-25 DIAGNOSIS — K2270 Barrett's esophagus without dysplasia: Secondary | ICD-10-CM | POA: Diagnosis not present

## 2020-07-25 DIAGNOSIS — K449 Diaphragmatic hernia without obstruction or gangrene: Secondary | ICD-10-CM

## 2020-07-25 MED ORDER — SODIUM CHLORIDE 0.9 % IV SOLN: 500.0000 mL | Freq: Once | INTRAVENOUS | Status: DC

## 2020-07-25 MED ORDER — FAMOTIDINE 40 MG PO TABS: 40.0000 mg | ORAL_TABLET | Freq: Every day | ORAL | 4 refills | Status: DC

## 2020-07-25 NOTE — Op Note (Signed)
[place] Patient Name: [name] Procedure Date: 07/25/2020 10:01 AM MRN: [id number] Endoscopist: Ladene Artist , MD Age: 75 Referring MD:  Date of Birth: February 24, 1946 Gender: Female Account #: [id number] Procedure:                Upper GI endoscopy Indications:              Gastroesophageal reflux disease Medicines:                Monitored Anesthesia Care Procedure:                Pre-Anesthesia Assessment:                           - Prior to the procedure, a History and Physical                            was performed, and patient medications and                            allergies were reviewed. The patient's tolerance of                            previous anesthesia was also reviewed. The risks                            and benefits of the procedure and the sedation                            options and risks were discussed with the patient.                            All questions were answered, and informed consent                            was obtained. Prior Anticoagulants: The patient has                            taken no previous anticoagulant or antiplatelet                            agents. ASA Grade Assessment: II - A patient with                            mild systemic disease. After reviewing the risks                            and benefits, the patient was deemed in                            satisfactory condition to undergo the procedure.                           After obtaining informed consent, the endoscope was  passed under direct vision. Throughout the                            procedure, the patient's blood pressure, pulse, and                            oxygen saturations were monitored continuously. The                            Endoscope was introduced through the mouth, and                            advanced to the second part of duodenum. The upper                            GI endoscopy was  accomplished without difficulty.                            The patient tolerated the procedure well. Scope In: Scope Out: Findings:                 LA Grade B (one or more mucosal breaks greater than                            5 mm, not extending between the tops of two mucosal                            folds) esophagitis with no bleeding was found at                            the gastroesophageal junction. Biopsies were taken                            with a cold forceps for histology.                           The Z-line was variable and was found 31 cm from                            the incisors. Biopsies were taken with a cold                            forceps for histology.                           The exam of the esophagus was otherwise normal.                           A medium-sized hiatal hernia was present.                           The exam of the stomach was otherwise normal.  The duodenal bulb and second portion of the                            duodenum were normal. Complications:            No immediate complications. Estimated Blood Loss:     Estimated blood loss was minimal. Impression:               - LA Grade B reflux esophagitis with no bleeding.                            Biopsied.                           - Z-line variable, 31 cm from the incisors.                            Biopsied.                           - Medium-sized hiatal hernia.                           - Normal duodenal bulb and second portion of the                            duodenum. Recommendation:           - Patient has a contact number available for                            emergencies. The signs and symptoms of potential                            delayed complications were discussed with the                            patient. Return to normal activities tomorrow.                            Written discharge instructions were provided to the                             patient.                           - Resume previous diet.                           - Closely follow antireflux measures including 4"                            HOB elevation.                           - Continue present medications.                           -  Take pantoprazole 40 mg po bid (not prn) every                            day.                           - Famotidine 40 mg po hs (not prn) 1 year of refills                           - Await pathology results. Ladene Artist, MD 07/25/2020 10:24:18 AM This report has been signed electronically.

## 2020-07-25 NOTE — Progress Notes (Signed)
Called to room to assist during endoscopic procedure.  Patient ID and intended procedure confirmed with present staff. Received instructions for my participation in the procedure from the performing physician.  

## 2020-07-25 NOTE — Patient Instructions (Signed)
Be sure to take all of your medication as needed.  Adjust your diet accordingly.  Read all of your handouts.  YOU HAD AN ENDOSCOPIC PROCEDURE TODAY AT [facility]:   Refer to the procedure report that was given to you for any specific questions about what was found during the examination.  If the procedure report does not answer your questions, please call your gastroenterologist to clarify.  If you requested that your care partner not be given the details of your procedure findings, then the procedure report has been included in a sealed envelope for you to review at your convenience later.  YOU SHOULD EXPECT: Some feelings of bloating in the abdomen. Passage of more gas than usual.  Walking can help get rid of the air that was put into your GI tract during the procedure and reduce the bloating.   Please Note:  You might notice some irritation and congestion in your nose or some drainage.  This is from the oxygen used during your procedure.  There is no need for concern and it should clear up in a day or so.  SYMPTOMS TO REPORT IMMEDIATELY:    Following upper endoscopy (EGD)  Vomiting of blood or coffee ground material  New chest pain or pain under the shoulder blades  Painful or persistently difficult swallowing  New shortness of breath  Fever of 100F or higher  Black, tarry-looking stools  For urgent or emergent issues, a gastroenterologist can be reached at any hour by calling [phone]. Do not use MyChart messaging for urgent concerns.    DIET:  We do recommend a small meal at first, but then you may proceed to your regular diet.  Drink plenty of fluids but you should avoid alcoholic beverages for 24 hours. Avoid acidic foods and drinks.  Keep a food diary, and keep track of your reflux symptoms.  ACTIVITY:  You should plan to take it easy for the rest of today and you should NOT DRIVE or use heavy machinery until tomorrow (because of the sedation medicines used  during the test).    FOLLOW UP: Our staff will call the number listed on your records 48-72 hours following your procedure to check on you and address any questions or concerns that you may have regarding the information given to you following your procedure. If we do not reach you, we will leave a message.  We will attempt to reach you two times.  During this call, we will ask if you have developed any symptoms of COVID 19. If you develop any symptoms (ie: fever, flu-like symptoms, shortness of breath, cough etc.) before then, please call [phone].  If you test positive for Covid 19 in the 2 weeks post procedure, please call and report this information to [place].    If any biopsies were taken you will be contacted by phone or by letter within the next 1-3 weeks.  Please call us at [phone] if you have not heard about the biopsies in 3 weeks.    SIGNATURES/CONFIDENTIALITY: You and/or your care partner have signed paperwork which will be entered into your electronic medical record.  These signatures attest to the fact that that the information above on your After Visit Summary has been reviewed and is understood.  Full responsibility of the confidentiality of this discharge information lies with you and/or your care-partner.

## 2020-07-25 NOTE — Progress Notes (Signed)
C.W. vital signs. 

## 2020-07-25 NOTE — Progress Notes (Signed)
Pt's states no medical or surgical changes since previsit or office visit. 

## 2020-07-25 NOTE — Progress Notes (Signed)
Pt Drowsy. VSS. To PACU, report to RN. No anesthetic complications noted.  

## 2020-07-25 NOTE — Progress Notes (Signed)
C.W. VITAL SIGNS.

## 2020-07-27 ENCOUNTER — Telehealth: Payer: Self-pay

## 2020-07-27 ENCOUNTER — Other Ambulatory Visit: Payer: Self-pay | Admitting: Internal Medicine

## 2020-07-27 NOTE — Telephone Encounter (Signed)
Entered in error a second telephone note. maw

## 2020-07-27 NOTE — Telephone Encounter (Signed)
  Follow up Call-  Call back number 07/25/2020  Post procedure Call Back phone  # [phone]  Permission to leave phone message Yes  Some recent data might be hidden     Patient questions:  Do you have a fever, pain , or abdominal swelling? Yes.   Pain Score  2 *  Have you tolerated food without any problems? Yes.    Have you been able to return to your normal activities? Yes.    Do you have any questions about your discharge instructions: Diet   No. Medications  No. Follow up visit  No.  Do you have questions or concerns about your Care? No.  Actions: * If pain score is 4 or above: No action needed, pain <4.  Per Pt, she has a "[place]agon sore throat", but eating without difficulity.  Also said she felt "Bloated".  I told pt MD did inflate her stomach with air.  Could possibly try Gas-X.  If sx continue to call us back. Maw   1. Have you developed a fever since your procedure? no  2.   Have you had an respiratory symptoms (SOB or cough) since your procedure? no  3.   Have you tested positive for COVID 19 since your procedure no  4.   Have you had any family members/close contacts diagnosed with the COVID 19 since your procedure?  no   If yes to any of these questions please route to Joylene John, RN and Joella Prince, RN

## 2020-08-02 ENCOUNTER — Encounter: Payer: Self-pay | Admitting: Gastroenterology

## 2020-08-03 ENCOUNTER — Encounter: Payer: Self-pay | Admitting: Gastroenterology

## 2020-08-03 ENCOUNTER — Other Ambulatory Visit: Payer: Self-pay | Admitting: Primary Care

## 2020-08-03 ENCOUNTER — Other Ambulatory Visit: Payer: Self-pay | Admitting: Internal Medicine

## 2020-08-03 DIAGNOSIS — G4700 Insomnia, unspecified: Secondary | ICD-10-CM

## 2020-08-23 ENCOUNTER — Other Ambulatory Visit: Payer: Self-pay | Admitting: Internal Medicine

## 2020-09-27 ENCOUNTER — Other Ambulatory Visit: Payer: Self-pay | Admitting: Primary Care

## 2020-09-27 DIAGNOSIS — L570 Actinic keratosis: Secondary | ICD-10-CM | POA: Diagnosis not present

## 2020-09-27 DIAGNOSIS — D485 Neoplasm of uncertain behavior of skin: Secondary | ICD-10-CM | POA: Diagnosis not present

## 2020-09-27 DIAGNOSIS — F331 Major depressive disorder, recurrent, moderate: Secondary | ICD-10-CM

## 2020-10-01 DIAGNOSIS — H40013 Open angle with borderline findings, low risk, bilateral: Secondary | ICD-10-CM | POA: Diagnosis not present

## 2020-10-16 ENCOUNTER — Ambulatory Visit (AMBULATORY_SURGERY_CENTER): Payer: Medicare HMO

## 2020-10-16 ENCOUNTER — Other Ambulatory Visit: Payer: Self-pay

## 2020-10-16 VITALS — Ht 62.5 in | Wt 127.0 lb

## 2020-10-16 DIAGNOSIS — Z86010 Personal history of colonic polyps: Secondary | ICD-10-CM

## 2020-10-16 MED ORDER — NA SULFATE-K SULFATE-MG SULF 17.5-3.13-1.6 GM/177ML PO SOLN: 1.0000 | Freq: Once | ORAL | 0 refills | Status: AC

## 2020-10-16 NOTE — Progress Notes (Signed)
Pre visit completed via phone call; Patient verified name, DOB, and address; No egg or soy allergy known to patient  No issues with past sedation with any surgeries or procedures---PONV Patient denies ever being told they had issues or difficulty with intubation----PONV  No FH of Malignant Hyperthermia No diet pills per patient No home 02 use per patient  No blood thinners per patient  Pt denies issues with constipation  No A fib or A flutter  EMMI video via MyChart  COVID 19 guidelines implemented in PV today with Pt and RN  Pt is fully vaccinated for Covid x 2 + booster; NO PA's for preps discussed with pt in PV today  Discussed with pt there will be an out-of-pocket cost for prep and that varies from $0 to 70 dollars  Due to the COVID-19 pandemic we are asking patients to follow certain guidelines.  Pt aware of COVID protocols and [facility] guidelines

## 2020-10-18 DIAGNOSIS — R921 Mammographic calcification found on diagnostic imaging of breast: Secondary | ICD-10-CM | POA: Diagnosis not present

## 2020-10-18 DIAGNOSIS — R928 Other abnormal and inconclusive findings on diagnostic imaging of breast: Secondary | ICD-10-CM | POA: Diagnosis not present

## 2020-10-18 DIAGNOSIS — R922 Inconclusive mammogram: Secondary | ICD-10-CM | POA: Diagnosis not present

## 2020-10-18 LAB — HM MAMMOGRAPHY

## 2020-10-22 ENCOUNTER — Encounter: Payer: Self-pay | Admitting: Primary Care

## 2020-10-22 ENCOUNTER — Telehealth: Payer: Self-pay

## 2020-10-22 NOTE — Telephone Encounter (Signed)
Please call patient she will be due for CPE after 12/13/2020. Please make appointment for patient.

## 2020-10-24 DIAGNOSIS — L570 Actinic keratosis: Secondary | ICD-10-CM | POA: Diagnosis not present

## 2020-10-24 DIAGNOSIS — X32XXXA Exposure to sunlight, initial encounter: Secondary | ICD-10-CM | POA: Diagnosis not present

## 2020-10-30 ENCOUNTER — Ambulatory Visit (AMBULATORY_SURGERY_CENTER): Payer: Medicare HMO | Admitting: Gastroenterology

## 2020-10-30 ENCOUNTER — Encounter: Payer: Self-pay | Admitting: Gastroenterology

## 2020-10-30 ENCOUNTER — Other Ambulatory Visit: Payer: Self-pay

## 2020-10-30 VITALS — BP 119/59 | HR 65 | Temp 97.8°F | Resp 11 | Ht 62.5 in | Wt 127.0 lb

## 2020-10-30 DIAGNOSIS — K635 Polyp of colon: Secondary | ICD-10-CM

## 2020-10-30 DIAGNOSIS — Z86010 Personal history of colonic polyps: Secondary | ICD-10-CM

## 2020-10-30 DIAGNOSIS — D125 Benign neoplasm of sigmoid colon: Secondary | ICD-10-CM

## 2020-10-30 DIAGNOSIS — I428 Other cardiomyopathies: Secondary | ICD-10-CM | POA: Diagnosis not present

## 2020-10-30 MED ORDER — SODIUM CHLORIDE 0.9 % IV SOLN: 500.0000 mL | Freq: Once | INTRAVENOUS | Status: DC

## 2020-10-30 NOTE — Progress Notes (Signed)
Called to room to assist during endoscopic procedure.  Patient ID and intended procedure confirmed with present staff. Received instructions for my participation in the procedure from the performing physician.  

## 2020-10-30 NOTE — Progress Notes (Signed)
VS completed by DT.  Pt's states no medical or surgical changes since previsit or office visit.  

## 2020-10-30 NOTE — Progress Notes (Signed)
History & Physical  Primary Care Physician:  [name], NP Primary Gastroenterologist: Jerilynn Mages. Fuller Plan, MD  CHIEF COMPLAINT:  Personal history of serrated sessile adenoma  HPI: [name] is a 75 y.o. female who presents for colonoscopy for surveillance of the serrated sessile adenoma on colonoscopy in 2013.   Past Medical History:  Diagnosis Date   Anxiety    when husband passed   Arthritis    generalized   Cardiomyopathy    nonischemic   Cataract    bilateral sx   Chronic back pain    Complication of anesthesia    Erosive esophagitis    LA Classification Grade B   Essential hypertension    on meds   GERD (gastroesophageal reflux disease)    Hiatal hernia    Hyperlipidemia    on meds   Insomnia    MDD (major depressive disorder)    situational - when husband passed   Melanoma ([place])    basil cell, pt reports no melanoma   Migraine    Osteopenia    PONV (postoperative nausea and vomiting)    Rosacea    Sessile colonic polyp 10/2011   adenoma   Squamous carcinoma    scalp    Past Surgical History:  Procedure Laterality Date   APPENDECTOMY     CARDIAC CATHETERIZATION     CATARACT EXTRACTION W/PHACO Right 03/18/2016   Procedure: CATARACT EXTRACTION PHACO AND INTRAOCULAR LENS PLACEMENT (Lead);  Surgeon: Birder [name], MD;  Location: [facility];  Service: Ophthalmology;  Laterality: Right;  [place] 45.3 AP% 22.2 CDE 10.07 Fluid pack lot # [id number]   CATARACT EXTRACTION W/PHACO Left 04/08/2016   Procedure: CATARACT EXTRACTION PHACO AND INTRAOCULAR LENS PLACEMENT (IOC);  Surgeon: Birder [name], MD;  Location: [facility];  Service: Ophthalmology;  Laterality: Left;  [place] 00:41 AP% 15.0 CDE 6.27 Fluid pack lot # [id number]   LOOP RECORDER INSERTION N/A 10/10/2016   Procedure: Loop Recorder Insertion;  Surgeon: Evans Lance, MD;  Location: [facility];  Service: Cardiovascular;  Laterality: N/A;   LOOP RECORDER REMOVAL  07/2020   SKIN CANCER EXCISION      scalp   TUBAL LIGATION     VAGINAL HYSTERECTOMY     WISDOM TOOTH EXTRACTION      Prior to Admission medications   Medication Sig Start Date End Date Taking? Authorizing Provider  acetaminophen (TYLENOL) 500 MG tablet Take 1,000 mg by mouth every 6 (six) hours as needed for moderate pain or headache.    [provider]  amLODipine (NORVASC) 2.5 MG tablet TAKE ONE TABLET EVERY DAY 08/03/20   Evans Lance, MD  carvedilol (COREG) 12.5 MG tablet Take 1 tablet (12.5 mg total) by mouth 2 (two) times daily with a meal. 07/27/20   Evans Lance, MD  Cholecalciferol (VITAMIN D3) 2000 units capsule Take 2,000 Units by mouth daily.    [provider]  enalapril (VASOTEC) 10 MG tablet TAKE ONE TABLET EVERY DAY 08/23/20   Evans Lance, MD  escitalopram (LEXAPRO) 20 MG tablet Take 1 tablet (20 mg total) by mouth daily. FOR ANXIETY. 04/02/20   [name], NP  famotidine (PEPCID) 40 MG tablet Take 1 tablet (40 mg total) by mouth at bedtime. 07/25/20   Ladene Artist, MD  Multiple Vitamin (MULTIVITAMIN) capsule Take 1 capsule by mouth daily.    [provider]  nitroGLYCERIN (NITROSTAT) 0.4 MG SL tablet Place 1 tablet (0.4 mg total) under the tongue  every 5 (five) minutes as needed for chest pain. 05/30/19 10/16/20  Evans Lance, MD  pantoprazole (PROTONIX) 40 MG tablet TAKE ONE TABLET BY MOUTH TWICE DAILY AS NEEDED FOR HEARTBURN 07/09/20   Ladene Artist, MD  Polyvinyl Alcohol-Povidone (REFRESH OP) Apply to eye.    [provider]  promethazine (PHENERGAN) 12.5 MG tablet TAKE ONE TABLET BY MOUTH TWICE DAILY AS NEEDED FOR NAUSEA OR VOMITING 06/20/20   [name], NP  rosuvastatin (CRESTOR) 20 MG tablet TAKE ONE TABLET EVERY DAY FOR CHOLESTEROL 07/12/20   Evans Lance, MD  zolpidem (AMBIEN) 10 MG tablet TAKE ONE TABLET AT BEDTIME IF NEEDED FORSLEEP 08/03/20   [name], NP    Current Outpatient Medications  Medication Sig Dispense Refill    acetaminophen (TYLENOL) 500 MG tablet Take 1,000 mg by mouth every 6 (six) hours as needed for moderate pain or headache.     amLODipine (NORVASC) 2.5 MG tablet TAKE ONE TABLET EVERY DAY 90 tablet 3   carvedilol (COREG) 12.5 MG tablet Take 1 tablet (12.5 mg total) by mouth 2 (two) times daily with a meal. 180 tablet 3   Cholecalciferol (VITAMIN D3) 2000 units capsule Take 2,000 Units by mouth daily.     enalapril (VASOTEC) 10 MG tablet TAKE ONE TABLET EVERY DAY 90 tablet 3   escitalopram (LEXAPRO) 20 MG tablet Take 1 tablet (20 mg total) by mouth daily. FOR ANXIETY. 90 tablet 2   famotidine (PEPCID) 40 MG tablet Take 1 tablet (40 mg total) by mouth at bedtime. 90 tablet 4   Multiple Vitamin (MULTIVITAMIN) capsule Take 1 capsule by mouth daily.     nitroGLYCERIN (NITROSTAT) 0.4 MG SL tablet Place 1 tablet (0.4 mg total) under the tongue every 5 (five) minutes as needed for chest pain. 30 tablet 3   pantoprazole (PROTONIX) 40 MG tablet TAKE ONE TABLET BY MOUTH TWICE DAILY AS NEEDED FOR HEARTBURN 180 tablet 0   Polyvinyl Alcohol-Povidone (REFRESH OP) Apply to eye.     promethazine (PHENERGAN) 12.5 MG tablet TAKE ONE TABLET BY MOUTH TWICE DAILY AS NEEDED FOR NAUSEA OR VOMITING 20 tablet 0   rosuvastatin (CRESTOR) 20 MG tablet TAKE ONE TABLET EVERY DAY FOR CHOLESTEROL 90 tablet 3   zolpidem (AMBIEN) 10 MG tablet TAKE ONE TABLET AT BEDTIME IF NEEDED FORSLEEP 90 tablet 0   Current Facility-Administered Medications  Medication Dose Route Frequency Provider Last Rate Last Admin   0.9 %  sodium chloride infusion  500 mL Intravenous Once Ladene Artist, MD        Allergies as of 10/30/2020 - Review Complete 10/30/2020  Allergen Reaction Noted   Penicillin g Swelling and Rash 12/30/2016   Penicillins Anaphylaxis and Hives    Sulfa antibiotics Hives 07/15/2010   Sulfasalazine Hives 07/15/2010   Cefaclor Itching and Rash 09/12/2011   Cefuroxime axetil Itching and Rash 07/15/2010   Clindamycin Itching  and Rash 12/30/2016   Clindamycin/lincomycin Itching and Rash 09/12/2011   Lansoprazole Other (See Comments) 12/30/2016   Latex Itching and Rash 06/28/2009   Lincomycin Itching and Rash 12/30/2016   Lincomycin hcl Itching and Rash 06/04/2015   Sulfamethoxazole-trimethoprim Rash 09/12/2011    Family History  Problem Relation Age of Onset   Alzheimer's disease Mother    Heart disease Father    Arthritis/Rheumatoid Sister    Colon cancer Neg Hx    Stomach cancer Neg Hx    Rectal cancer Neg Hx    Esophageal cancer Neg Hx  Colon polyps Neg Hx     Social History   Socioeconomic History   Marital status: Widowed    Spouse name: Not on file   Number of children: 3   Years of education: 24   Highest education level: Not on file  Occupational History   Not on file  Tobacco Use   Smoking status: Former    Types: Cigarettes    Quit date: 06/03/1981    Years since quitting: 39.4   Smokeless tobacco: Never  Vaping Use   Vaping Use: Never used  Substance and Sexual Activity   Alcohol use: Yes    Alcohol/week: 4.0 standard drinks    Types: 4 Glasses of wine per week    Comment: Wine per night   Drug use: No   Sexual activity: Not Currently  Other Topics Concern   Not on file  Social History Narrative   Lives alone in two-story home.     Her husband passed away unexpectedly.     They have three children- one is a pediatrician.   Desires CPR.   Caffeine use: Decaf coffee   Right handed   Social Determinants of Health   Financial Resource Strain: Not on file  Food Insecurity: Not on file  Transportation Needs: Not on file  Physical Activity: Not on file  Stress: Not on file  Social Connections: Not on file  Intimate Partner Violence: Not on file    Review of Systems:  All systems reviewed an negative except where noted in HPI.  Gen: Denies any fever, chills, sweats, anorexia, fatigue, weakness, malaise, weight loss, and sleep disorder CV: Denies chest pain,  angina, palpitations, syncope, orthopnea, PND, peripheral edema, and claudication. Resp: Denies dyspnea at rest, dyspnea with exercise, cough, sputum, wheezing, coughing up blood, and pleurisy. GI: Denies vomiting blood, jaundice, and fecal incontinence.   Denies dysphagia or odynophagia. GU : Denies urinary burning, blood in urine, urinary frequency, urinary hesitancy, nocturnal urination, and urinary incontinence. MS: Denies joint pain, limitation of movement, and swelling, stiffness, low back pain, extremity pain. Denies muscle weakness, cramps, atrophy.  Derm: Denies rash, itching, dry skin, hives, moles, warts, or unhealing ulcers.  Psych: Denies depression, anxiety, memory loss, suicidal ideation, hallucinations, paranoia, and confusion. Heme: Denies bruising, bleeding, and enlarged lymph nodes. Neuro:  Denies any headaches, dizziness, paresthesias. Endo:  Denies any problems with DM, thyroid, adrenal function.  Physical Exam: Vital signs in last 24 hours: '@VSRANGES'$ @   General:  Alert, well-developed, in NAD Head:  Normocephalic and atraumatic. Eyes:  Sclera clear, no icterus.   Conjunctiva pink. Ears:  Normal auditory acuity. Mouth:  No deformity or lesions.  Neck:  Supple; no masses . Lungs:  Clear throughout to auscultation.   No wheezes, crackles, or rhonchi. No acute distress. Heart:  Regular rate and rhythm; no murmurs. Abdomen:  Soft, nondistended, nontender. No masses, hepatomegaly. No obvious masses.  Normal bowel .    Rectal:  Deferred   Msk:  Symmetrical without gross deformities.. Pulses:  Normal pulses noted. Extremities:  Without edema. Neurologic:  Alert and  oriented x4;  grossly normal neurologically. Skin:  Intact without significant lesions or rashes. Cervical Nodes:  No significant cervical adenopathy. Psych:  Alert and cooperative. Normal mood and affect.   Impression / Plan:   Personal history of a sessile serrated adenoma in 2013 for surveillance  colonoscopy. The risks (including bleeding, perforation, infection, missed lesions, medication reactions and possible hospitalization or surgery if complications occur), benefits, and alternatives to  colonoscopy with possible biopsy and possible polypectomy were discussed with the patient and they consent to proceed.      2.   Cardiomyopathy, nonischemic, with EF=50-55% in March 2021   This patient is appropriate for endoscopic procedures in the ambulatory setting.    [name]. Fuller Plan MD 10/30/2020, 10:26 AM

## 2020-10-30 NOTE — Op Note (Addendum)
[place] Patient Name: [name] Procedure Date: 10/30/2020 11:06 AM MRN: [id number] Endoscopist: Ladene Artist , MD Age: 75 Referring MD:  Date of Birth: 1945-11-27 Gender: Female Account #: [id number] Procedure:                Colonoscopy Indications:              High risk colon cancer surveillance: Personal                            history of traditional serrated adenoma of the colon Medicines:                Monitored Anesthesia Care Procedure:                Pre-Anesthesia Assessment:                           - Prior to the procedure, a History and Physical                            was performed, and patient medications and                            allergies were reviewed. The patient's tolerance of                            previous anesthesia was also reviewed. The risks                            and benefits of the procedure and the sedation                            options and risks were discussed with the patient.                            All questions were answered, and informed consent                            was obtained. Prior Anticoagulants: The patient has                            taken no previous anticoagulant or antiplatelet                            agents. ASA Grade Assessment: III - A patient with                            severe systemic disease. After reviewing the risks                            and benefits, the patient was deemed in                            satisfactory condition to undergo the procedure.  After obtaining informed consent, the colonoscope                            was passed under direct vision. Throughout the                            procedure, the patient's blood pressure, pulse, and                            oxygen saturations were monitored continuously. The                            Olympus [id number]) Colonoscope was                            introduced  through the anus and advanced to the the                            cecum, identified by appendiceal orifice and                            ileocecal valve. The ileocecal valve, appendiceal                            orifice, and rectum were photographed. The quality                            of the bowel preparation was good. The colonoscopy                            was performed without difficulty. The patient                            tolerated the procedure well. Scope In: 11:18:50 AM Scope Out: 11:40:08 AM Scope Withdrawal Time: 0 hours 16 minutes 21 seconds  Total Procedure Duration: 0 hours 21 minutes 18 seconds  Findings:                 The perianal and digital rectal examinations showed                            external tags, otherwise normal.                           A 6 mm polyp was found in the sigmoid colon. The                            polyp was sessile. The polyp was removed with a                            cold snare. Resection and retrieval were complete.                           A few medium-mouthed diverticula were found in the  left colon. There was no evidence of diverticular                            bleeding.                           The exam was otherwise without abnormality on                            direct and retroflexion views. Complications:            No immediate complications. Estimated blood loss:                            None. Estimated Blood Loss:     Estimated blood loss: none. Impression:               - One 6 mm polyp in the sigmoid colon, removed with                            a cold snare. Resected and retrieved.                           - Mild diverticulosis in the left colon.                           - The examination was otherwise normal on direct                            and retroflexion views. Recommendation:           - Patient has a contact number available for                             emergencies. The signs and symptoms of potential                            delayed complications were discussed with the                            patient. Return to normal activities tomorrow.                            Written discharge instructions were provided to the                            patient.                           - High fiber diet.                           - Continue present medications.                           - Await pathology results.                           -  No repeat colonoscopy due to age. Ladene Artist, MD 10/30/2020 11:43:34 AM This report has been signed electronically.

## 2020-10-30 NOTE — Patient Instructions (Addendum)
Handouts provided on polyps, diverticulosis and high-fiber diet.   Recommend a high-fiber diet.   No repeat colonoscopy for screening purposes due to age. If you develop any new symptoms please contact my office.   YOU HAD AN ENDOSCOPIC PROCEDURE TODAY AT [facility]:   Refer to the procedure report that was given to you for any specific questions about what was found during the examination.  If the procedure report does not answer your questions, please call your gastroenterologist to clarify.  If you requested that your care partner not be given the details of your procedure findings, then the procedure report has been included in a sealed envelope for you to review at your convenience later.  YOU SHOULD EXPECT: Some feelings of bloating in the abdomen. Passage of more gas than usual.  Walking can help get rid of the air that was put into your GI tract during the procedure and reduce the bloating. If you had a lower endoscopy (such as a colonoscopy or flexible sigmoidoscopy) you may notice spotting of blood in your stool or on the toilet paper. If you underwent a bowel prep for your procedure, you may not have a normal bowel movement for a few days.  Please Note:  You might notice some irritation and congestion in your nose or some drainage.  This is from the oxygen used during your procedure.  There is no need for concern and it should clear up in a day or so.  SYMPTOMS TO REPORT IMMEDIATELY:  Following lower endoscopy (colonoscopy or flexible sigmoidoscopy):  Excessive amounts of blood in the stool  Significant tenderness or worsening of abdominal pains  Swelling of the abdomen that is new, acute  Fever of 100F or higher  For urgent or emergent issues, a gastroenterologist can be reached at any hour by calling [phone]. Do not use MyChart messaging for urgent concerns.    DIET:  We do recommend a small meal at first, but then you may proceed to your regular diet.   Drink plenty of fluids but you should avoid alcoholic beverages for 24 hours.  ACTIVITY:  You should plan to take it easy for the rest of today and you should NOT DRIVE or use heavy machinery until tomorrow (because of the sedation medicines used during the test).    FOLLOW UP: Our staff will call the number listed on your records 48-72 hours following your procedure to check on you and address any questions or concerns that you may have regarding the information given to you following your procedure. If we do not reach you, we will leave a message.  We will attempt to reach you two times.  During this call, we will ask if you have developed any symptoms of COVID 19. If you develop any symptoms (ie: fever, flu-like symptoms, shortness of breath, cough etc.) before then, please call [phone].  If you test positive for Covid 19 in the 2 weeks post procedure, please call and report this information to [place].    If any biopsies were taken you will be contacted by phone or by letter within the next 1-3 weeks.  Please call us at [phone] if you have not heard about the biopsies in 3 weeks.    SIGNATURES/CONFIDENTIALITY: You and/or your care partner have signed paperwork which will be entered into your electronic medical record.  These signatures attest to the fact that that the information above on your After Visit Summary has been reviewed and is  understood.  Full responsibility of the confidentiality of this discharge information lies with you and/or your care-partner.

## 2020-10-30 NOTE — Progress Notes (Signed)
PT taken to PACU. Monitors in place. VSS. Report given to RN. 

## 2020-11-01 ENCOUNTER — Telehealth: Payer: Self-pay

## 2020-11-01 NOTE — Telephone Encounter (Signed)
  Follow up Call-  Call back number 10/30/2020 07/25/2020  Post procedure Call Back phone  # [phone]  Permission to leave phone message Yes Yes  Some recent data might be hidden     Patient questions:  Do you have a fever, pain , or abdominal swelling? No. Pain Score  0 *  Have you tolerated food without any problems? Yes.    Have you been able to return to your normal activities? Yes.    Do you have any questions about your discharge instructions: Diet   No. Medications  No. Follow up visit  No.  Do you have questions or concerns about your Care? No.  Actions: * If pain score is 4 or above: No action needed, pain <4.

## 2020-11-07 ENCOUNTER — Other Ambulatory Visit: Payer: Self-pay | Admitting: Primary Care

## 2020-11-07 DIAGNOSIS — G4700 Insomnia, unspecified: Secondary | ICD-10-CM

## 2020-11-14 ENCOUNTER — Encounter: Payer: Self-pay | Admitting: Gastroenterology

## 2020-11-16 DIAGNOSIS — M1712 Unilateral primary osteoarthritis, left knee: Secondary | ICD-10-CM | POA: Diagnosis not present

## 2020-11-16 DIAGNOSIS — M5459 Other low back pain: Secondary | ICD-10-CM | POA: Diagnosis not present

## 2020-11-16 DIAGNOSIS — M25511 Pain in right shoulder: Secondary | ICD-10-CM | POA: Diagnosis not present

## 2020-11-23 DIAGNOSIS — M1712 Unilateral primary osteoarthritis, left knee: Secondary | ICD-10-CM | POA: Diagnosis not present

## 2020-11-23 DIAGNOSIS — M5459 Other low back pain: Secondary | ICD-10-CM | POA: Diagnosis not present

## 2020-11-23 DIAGNOSIS — M5136 Other intervertebral disc degeneration, lumbar region: Secondary | ICD-10-CM | POA: Diagnosis not present

## 2020-11-23 DIAGNOSIS — M62830 Muscle spasm of back: Secondary | ICD-10-CM | POA: Diagnosis not present

## 2020-11-23 DIAGNOSIS — M25562 Pain in left knee: Secondary | ICD-10-CM | POA: Diagnosis not present

## 2020-11-30 DIAGNOSIS — M25562 Pain in left knee: Secondary | ICD-10-CM | POA: Diagnosis not present

## 2020-11-30 DIAGNOSIS — M1712 Unilateral primary osteoarthritis, left knee: Secondary | ICD-10-CM | POA: Diagnosis not present

## 2020-12-06 DIAGNOSIS — M545 Low back pain, unspecified: Secondary | ICD-10-CM | POA: Diagnosis not present

## 2020-12-06 DIAGNOSIS — M1712 Unilateral primary osteoarthritis, left knee: Secondary | ICD-10-CM | POA: Diagnosis not present

## 2020-12-10 DIAGNOSIS — M25562 Pain in left knee: Secondary | ICD-10-CM | POA: Diagnosis not present

## 2020-12-10 DIAGNOSIS — M1712 Unilateral primary osteoarthritis, left knee: Secondary | ICD-10-CM | POA: Diagnosis not present

## 2020-12-10 DIAGNOSIS — M545 Low back pain, unspecified: Secondary | ICD-10-CM | POA: Diagnosis not present

## 2020-12-13 ENCOUNTER — Encounter: Payer: Medicare HMO | Admitting: Primary Care

## 2020-12-19 DIAGNOSIS — M1712 Unilateral primary osteoarthritis, left knee: Secondary | ICD-10-CM | POA: Diagnosis not present

## 2020-12-27 ENCOUNTER — Other Ambulatory Visit: Payer: Self-pay | Admitting: Primary Care

## 2020-12-27 DIAGNOSIS — F331 Major depressive disorder, recurrent, moderate: Secondary | ICD-10-CM

## 2020-12-29 ENCOUNTER — Other Ambulatory Visit: Payer: Self-pay | Admitting: Gastroenterology

## 2020-12-29 DIAGNOSIS — K2100 Gastro-esophageal reflux disease with esophagitis, without bleeding: Secondary | ICD-10-CM

## 2021-01-09 ENCOUNTER — Encounter: Payer: Medicare HMO | Admitting: Primary Care

## 2021-01-10 ENCOUNTER — Other Ambulatory Visit: Payer: Self-pay | Admitting: Primary Care

## 2021-01-10 DIAGNOSIS — R519 Headache, unspecified: Secondary | ICD-10-CM

## 2021-01-17 DIAGNOSIS — L570 Actinic keratosis: Secondary | ICD-10-CM | POA: Diagnosis not present

## 2021-01-17 DIAGNOSIS — D2261 Melanocytic nevi of right upper limb, including shoulder: Secondary | ICD-10-CM | POA: Diagnosis not present

## 2021-01-17 DIAGNOSIS — X32XXXA Exposure to sunlight, initial encounter: Secondary | ICD-10-CM | POA: Diagnosis not present

## 2021-01-17 DIAGNOSIS — D2262 Melanocytic nevi of left upper limb, including shoulder: Secondary | ICD-10-CM | POA: Diagnosis not present

## 2021-01-17 DIAGNOSIS — D2272 Melanocytic nevi of left lower limb, including hip: Secondary | ICD-10-CM | POA: Diagnosis not present

## 2021-01-17 DIAGNOSIS — Z85828 Personal history of other malignant neoplasm of skin: Secondary | ICD-10-CM | POA: Diagnosis not present

## 2021-01-22 ENCOUNTER — Encounter: Payer: Medicare HMO | Admitting: Primary Care

## 2021-01-23 DIAGNOSIS — M1712 Unilateral primary osteoarthritis, left knee: Secondary | ICD-10-CM | POA: Diagnosis not present

## 2021-01-23 DIAGNOSIS — M25562 Pain in left knee: Secondary | ICD-10-CM | POA: Diagnosis not present

## 2021-01-28 ENCOUNTER — Other Ambulatory Visit: Payer: Self-pay | Admitting: Primary Care

## 2021-01-28 DIAGNOSIS — F331 Major depressive disorder, recurrent, moderate: Secondary | ICD-10-CM

## 2021-01-30 ENCOUNTER — Telehealth: Payer: Self-pay

## 2021-01-30 NOTE — Telephone Encounter (Signed)
   [facility] Pre-operative Risk Assessment    Patient Name: [name]  DOB: Jul 28, 1945 MRN: [id number]  HEARTCARE STAFF:  - IMPORTANT!!!!!! Under Visit Info/Reason for Call, type in Other and utilize the format Clearance MM/DD/YY or Clearance TBD. Do not use dashes or single digits. - Please review there is not already an duplicate clearance open for this procedure. - If request is for dental extraction, please clarify the # of teeth to be extracted. - If the patient is currently at the dentist's office, call Pre-Op Callback Staff (MA/nurse) to input urgent request.  - If the patient is not currently in the dentist office, please route to the Pre-Op pool.  Request for surgical clearance:  What type of surgery is being performed? LEFT TOTAL KNEE ARTHROPLASTY  When is this surgery scheduled? 03/12/2021  What type of clearance is required (medical clearance vs. Pharmacy clearance to hold med vs. Both)? Medical   Are there any medications that need to be held prior to surgery and how long? N/A  Practice name and name of physician performing surgery? [facility]    Dr Paralee Cancel  What is the office phone number? [phone] Orson Slick)   7.   What is the office fax number? [phone]  8.   Anesthesia type (None, local, MAC, general) ? Spinal   Ulice Brilliant T 01/30/2021, 11:04 AM  _________________________________________________________________   (provider comments below)

## 2021-01-30 NOTE — Telephone Encounter (Signed)
    Patient Name: [name]  DOB: 1945/11/15 MRN: [id number]  Primary Cardiologist: Dorris Carnes, MD  Chart reviewed as part of pre-operative protocol coverage. Patient was last seen by Dr. Lovena Le in 07/2020. She was contacted today for additional pre-op evaluation and reported doing well since last visit. She denies any chest pain, shortness of breath, orthopnea, significant palpitations, or syncope. Her activity is limited by her knee pain but she is still able to complete >4.0 METS. Given past medical history and time since last visit, based on ACC/AHA guidelines, Landi Biscardi Byas would be at acceptable risk for the planned procedure without further cardiovascular testing.   The patient was advised that if she develops new symptoms prior to surgery to contact our office to arrange for a follow-up visit, and she verbalized understanding.  I will route this recommendation to the requesting party via Epic fax function and remove from pre-op pool.  Please call with questions.  Darreld Mclean, PA-C 01/30/2021, 12:19 PM

## 2021-02-06 ENCOUNTER — Other Ambulatory Visit: Payer: Self-pay | Admitting: Primary Care

## 2021-02-06 DIAGNOSIS — G4700 Insomnia, unspecified: Secondary | ICD-10-CM

## 2021-02-12 ENCOUNTER — Telehealth: Payer: Self-pay

## 2021-02-12 NOTE — Telephone Encounter (Signed)
Called patient she states she has been cleared by cardiology and has labs at [facility] long. She declines to set  up appointment with our office.  Did not want to come in to our temporary office for visit. She will reach out to orthopedics to make sure clearance is not needed by our office. If so she will call to set up appointment. Form sent to shred will need another sent if requested.

## 2021-02-25 NOTE — Patient Instructions (Signed)
DUE TO COVID-19 ONLY ONE VISITOR IS ALLOWED TO COME WITH YOU AND STAY IN THE WAITING ROOM ONLY DURING PRE OP AND PROCEDURE.   **NO VISITORS ARE ALLOWED IN THE SHORT STAY AREA OR RECOVERY ROOM!!**  IF YOU WILL BE ADMITTED INTO THE HOSPITAL YOU ARE ALLOWED ONLY TWO SUPPORT PEOPLE DURING VISITATION HOURS ONLY (7 AM -8PM)    Up to two visitors ages 41+ are allowed at one time in a patient's room.  The visitors may rotate out with other people throughout the day.  Additionally, up to two children between the ages of 3 and 54 are allowed and do not count toward the number of allowed visitors.  Children within this age range must be accompanied by an adult visitor.  One adult visitor may remain with the patient overnight and must be in the room by 8 PM.  COVID SWAB TESTING MUST BE COMPLETED ON:  Friday 03-08-21, Between the hours of 8 and 3  **MUST PRESENT COMPLETED FORM AT TESTING SITE**    [place] (backside of the building)  You are not required to quarantine, however you are required to wear a well-fitted mask when you are out and around people not in your household.  Hand Hygiene often Do NOT share personal items Notify your provider if you are in close contact with someone who has COVID or you develop fever 100.4 or greater, new onset of sneezing, cough, sore throat, shortness of breath or body aches.        Your procedure is scheduled on: Tuesday, 03-12-20   Report to [facility] Main  Entrance     Report to admitting at 10:15 AM   Call this number if you have problems the morning of surgery [phone]   Do not eat food :After Midnight.   May have liquids until 10:00 AM day of surgery  CLEAR LIQUID DIET  Foods Allowed                                                                     Foods Excluded  Water, Black Coffee (no milk/no creamer) and tea, regular and decaf                              liquids that you cannot  Plain Jell-O in any  flavor  (No red)                         see through such as: Fruit ices (not with fruit pulp)                                 milk, soups, orange juice  Iced Popsicles (No red)                                    All solid food                             Apple juices Sports  drinks like Gatorade (No red) Lightly seasoned clear broth or consume(fat free) Sugar   Complete one Ensure drink the morning of surgery at 10:00 AM the day of surgery.      The day of surgery:  Drink ONE (1) Pre-Surgery Clear Ensure the morning of surgery. Drink in one sitting. Do not sip.  This drink was given to you during your hospital  pre-op appointment visit. Nothing else to drink after completing the Pre-Surgery Clear Ensure           If you have questions, please contact your surgeons office.     Oral Hygiene is also important to reduce your risk of infection.                                    Remember - BRUSH YOUR TEETH THE MORNING OF SURGERY WITH YOUR REGULAR TOOTHPASTE   Do NOT smoke after Midnight   Take these medicines the morning of surgery with A SIP OF WATER: Tylenol, Amlodipine, Carvedilol, Pantoprazole   Stop all vitamins and herbal supplements a week before surgery             You may not have any metal on your body including hair pins, jewelry, and body piercing             Do not wear make-up, lotions, powders, perfumes or deodorant  Do not wear nail polish including gel and S&S, artificial/acrylic nails, or any other type of covering on natural nails including finger and toenails. If you have artificial nails, gel coating, etc. that needs to be removed by a nail salon please have this removed prior to surgery or surgery may need to be canceled/ delayed if the surgeon/ anesthesia feels like they are unable to be safely monitored.   Do not shave  48 hours prior to surgery.   Do not bring valuables to the hospital. [place].   Contacts, dentures or  bridgework may not be worn into surgery.   Bring small overnight bag day of surgery.  Special Instructions: Bring a copy of your healthcare power of attorney and living will documents the day of surgery if you haven't scanned them in before.  Please read over the following fact sheets you were given: IF YOU HAVE QUESTIONS ABOUT YOUR PRE OP INSTRUCTIONS PLEASE CALL [phone]   [place] - Preparing for Surgery Before surgery, you can play an important role.  Because skin is not sterile, your skin needs to be as free of germs as possible.  You can reduce the number of germs on your skin by washing with CHG (chlorahexidine gluconate) soap before surgery.  CHG is an antiseptic cleaner which kills germs and bonds with the skin to continue killing germs even after washing. Please DO NOT use if you have an allergy to CHG or antibacterial soaps.  If your skin becomes reddened/irritated stop using the CHG and inform your nurse when you arrive at Short Stay. Do not shave (including legs and underarms) for at least 48 hours prior to the first CHG shower.  You may shave your face/neck.  Please follow these instructions carefully:  1.  Shower with CHG Soap the night before surgery and the  morning of surgery.  2.  If you choose to wash your hair, wash your hair first as usual with your normal  shampoo.  3.  After you shampoo,  rinse your hair and body thoroughly to remove the shampoo.                             4.  Use CHG as you would any other liquid soap.  You can apply chg directly to the skin and wash.  Gently with a scrungie or clean washcloth.  5.  Apply the CHG Soap to your body ONLY FROM THE NECK DOWN.   Do   not use on face/ open                           Wound or open sores. Avoid contact with eyes, ears mouth and   genitals (private parts).                       Wash face,  Genitals (private parts) with your normal soap.             6.  Wash thoroughly, paying special attention to the area  where your    surgery  will be performed.  7.  Thoroughly rinse your body with warm water from the neck down.  8.  DO NOT shower/wash with your normal soap after using and rinsing off the CHG Soap.                9.  Pat yourself dry with a clean towel.            10.  Wear clean pajamas.            11.  Place clean sheets on your bed the night of your first shower and do not  sleep with pets. Day of Surgery : Do not apply any lotions/deodorants the morning of surgery.  Please wear clean clothes to the hospital/surgery center.  FAILURE TO FOLLOW THESE INSTRUCTIONS MAY RESULT IN THE CANCELLATION OF YOUR SURGERY  PATIENT SIGNATURE_________________________________  NURSE SIGNATURE__________________________________  ________________________________________________________________________   [name]  An incentive spirometer is a tool that can help keep your lungs clear and active. This tool measures how well you are filling your lungs with each breath. Taking long deep breaths may help reverse or decrease the chance of developing breathing (pulmonary) problems (especially infection) following: A long period of time when you are unable to move or be active. BEFORE THE PROCEDURE  If the spirometer includes an indicator to show your best effort, your nurse or respiratory therapist will set it to a desired goal. If possible, sit up straight or lean slightly forward. Try not to slouch. Hold the incentive spirometer in an upright position. INSTRUCTIONS FOR USE  Sit on the edge of your bed if possible, or sit up as far as you can in bed or on a chair. Hold the incentive spirometer in an upright position. Breathe out normally. Place the mouthpiece in your mouth and seal your lips tightly around it. Breathe in slowly and as deeply as possible, raising the piston or the ball toward the top of the column. Hold your breath for 3-5 seconds or for as long as possible. Allow the piston or ball  to fall to the bottom of the column. Remove the mouthpiece from your mouth and breathe out normally. Rest for a few seconds and repeat Steps 1 through 7 at least 10 times every 1-2 hours when you are awake. Take your time and take a few normal breaths  between deep breaths. The spirometer may include an indicator to show your best effort. Use the indicator as a goal to work toward during each repetition. After each set of 10 deep breaths, practice coughing to be sure your lungs are clear. If you have an incision (the cut made at the time of surgery), support your incision when coughing by placing a pillow or rolled up towels firmly against it. Once you are able to get out of bed, walk around indoors and cough well. You may stop using the incentive spirometer when instructed by your caregiver.  RISKS AND COMPLICATIONS Take your time so you do not get dizzy or light-headed. If you are in pain, you may need to take or ask for pain medication before doing incentive spirometry. It is harder to take a deep breath if you are having pain. AFTER USE Rest and breathe slowly and easily. It can be helpful to keep track of a log of your progress. Your caregiver can provide you with a simple table to help with this. If you are using the spirometer at home, follow these instructions: [place] IF:  You are having difficultly using the spirometer. You have trouble using the spirometer as often as instructed. Your pain medication is not giving enough relief while using the spirometer. You develop fever of 100.5 F (38.1 C) or higher. SEEK IMMEDIATE MEDICAL CARE IF:  You cough up bloody sputum that had not been present before. You develop fever of 102 F (38.9 C) or greater. You develop worsening pain at or near the incision site. MAKE SURE YOU:  Understand these instructions. Will watch your condition. Will get help right away if you are not doing well or get worse. Document Released: 07/07/2006  Document Revised: 05/19/2011 Document Reviewed: 09/07/2006 Exit[employer] Patient Information 2014 [employer], [place].   ________________________________________________________________________  WHAT IS A BLOOD TRANSFUSION? Blood Transfusion Information  A transfusion is the replacement of blood or some of its parts. Blood is made up of multiple cells which provide different functions. Red blood cells carry oxygen and are used for blood loss replacement. White blood cells fight against infection. Platelets control bleeding. Plasma helps clot blood. Other blood products are available for specialized needs, such as hemophilia or other clotting disorders. BEFORE THE TRANSFUSION  Who gives blood for transfusions?  Healthy volunteers who are fully evaluated to make sure their blood is safe. This is blood bank blood. Transfusion therapy is the safest it has ever been in the practice of medicine. Before blood is taken from a donor, a complete history is taken to make sure that person has no history of diseases nor engages in risky social behavior (examples are intravenous drug use or sexual activity with multiple partners). The donor's travel history is screened to minimize risk of transmitting infections, such as malaria. The donated blood is tested for signs of infectious diseases, such as HIV and hepatitis. The blood is then tested to be sure it is compatible with you in order to minimize the chance of a transfusion reaction. If you or a relative donates blood, this is often done in anticipation of surgery and is not appropriate for emergency situations. It takes many days to process the donated blood. RISKS AND COMPLICATIONS Although transfusion therapy is very safe and saves many lives, the main dangers of transfusion include:  Getting an infectious disease. Developing a transfusion reaction. This is an allergic reaction to something in the blood you were given. Every precaution is taken to prevent  this. The decision to have a blood transfusion has been considered carefully by your caregiver before blood is given. Blood is not given unless the benefits outweigh the risks. AFTER THE TRANSFUSION Right after receiving a blood transfusion, you will usually feel much better and more energetic. This is especially true if your red blood cells have gotten low (anemic). The transfusion raises the level of the red blood cells which carry oxygen, and this usually causes an energy increase. The nurse administering the transfusion will monitor you carefully for complications. HOME CARE INSTRUCTIONS  No special instructions are needed after a transfusion. You may find your energy is better. Speak with your caregiver about any limitations on activity for underlying diseases you may have. SEEK MEDICAL CARE IF:  Your condition is not improving after your transfusion. You develop redness or irritation at the intravenous (IV) site. SEEK IMMEDIATE MEDICAL CARE IF:  Any of the following symptoms occur over the next 12 hours: Shaking chills. You have a temperature by mouth above 102 F (38.9 C), not controlled by medicine. Chest, back, or muscle pain. People around you feel you are not acting correctly or are confused. Shortness of breath or difficulty breathing. Dizziness and fainting. You get a rash or develop hives. You have a decrease in urine output. Your urine turns a dark color or changes to pink, red, or brown. Any of the following symptoms occur over the next 10 days: You have a temperature by mouth above 102 F (38.9 C), not controlled by medicine. Shortness of breath. Weakness after normal activity. The white part of the eye turns yellow (jaundice). You have a decrease in the amount of urine or are urinating less often. Your urine turns a dark color or changes to pink, red, or brown. Document Released: 02/22/2000 Document Revised: 05/19/2011 Document Reviewed: 10/11/2007 [facility] Patient  Information 2014 [place], [place].  _______________________________________________________________________

## 2021-02-26 NOTE — Progress Notes (Addendum)
Anesthesia Review:  PCP: DR Alma Friendly  Cardiologist :01/30/21- telephone encounter clearance  ON CHART  DR Dorris Carnes - LOV 10/24/19  DR [name] - 07/11/20  Hx of Loop Recorder  Chest x-ray : EKG :07/11/20  Ct cors- 10/15/19  04/2019- last device check  Echo : Stress te3/31/21 st: Cardiac Cath :  Activity level: can do a flight of stairs without difficulty  Sleep Study/ CPAP : none  Fasting Blood Sugar :      / Checks Blood Sugar -- times a day:   Blood Thinner/ Instructions /Last Dose: ASA / Instructions/ Last Dose :   Covid test- 03/08/21 at 0930am .

## 2021-02-26 NOTE — Progress Notes (Addendum)
You will need to have a covid test done prior to surgery.  Date for covid test is 03/07/2021 .  Come in thru Main entrance at [facility].  Have a seat in the lobby area to the right as you come in the door.  Dial [phone] and give them your name and let then know you are here for covid testing .                 Your procedure is scheduled on:   03/12/2021   Report to [facility] Main  Entrance   Report to admitting at   1015AM     Call this number if you have problems the morning of surgery [phone]    REMEMBER: NO  SOLID FOOD CANDY OR GUM AFTER MIDNIGHT. CLEAR LIQUIDS UNTIL   1000 am         . NOTHING BY MOUTH EXCEPT CLEAR LIQUIDS UNTIL      1000am    . PLEASE FINISH ENSURE DRINK PER SURGEON ORDER  WHICH NEEDS TO BE COMPLETED AT  1000am     .      CLEAR LIQUID DIET   Foods Allowed                                                                    Coffee and tea, regular and decaf                            Fruit ices (not with fruit pulp)                                      Iced Popsicles                                    Carbonated beverages, regular and diet                                    Cranberry, grape and apple juices Sports drinks like Gatorade Lightly seasoned clear broth or consume(fat free) Sugar, honey syrup ___________________________________________________________________      BRUSH YOUR TEETH MORNING OF SURGERY AND RINSE YOUR MOUTH OUT, NO CHEWING GUM CANDY OR MINTS.     Take these medicines the morning of surgery with A SIP OF WATER:  amlodipine, coreg, protonix   DO NOT TAKE ANY DIABETIC MEDICATIONS DAY OF YOUR SURGERY                               You may not have any metal on your body including hair pins and              piercings  Do not wear jewelry, make-up, lotions, powders or perfumes, deodorant             Do not wear nail polish on your fingernails.  Do not shave  48 hours prior to surgery.  Men may  shave face and neck.   Do not bring valuables to the hospital. [place].  Contacts, dentures or bridgework may not be worn into surgery.  Leave suitcase in the car. After surgery it may be brought to your room.     Patients discharged the day of surgery will not be allowed to drive home. IF YOU ARE HAVING SURGERY AND GOING HOME THE SAME DAY, YOU MUST HAVE AN ADULT TO DRIVE YOU HOME AND BE WITH YOU FOR 24 HOURS. YOU MAY GO HOME BY TAXI OR UBER OR ORTHERWISE, BUT AN ADULT MUST ACCOMPANY YOU HOME AND STAY WITH YOU FOR 24 HOURS.  Name and phone number of your driver:  Special Instructions: N/A              Please read over the following fact sheets you were given: _____________________________________________________________________  [facility] - Preparing for Surgery Before surgery, you can play an important role.  Because skin is not sterile, your skin needs to be as free of germs as possible.  You can reduce the number of germs on your skin by washing with CHG (chlorahexidine gluconate) soap before surgery.  CHG is an antiseptic cleaner which kills germs and bonds with the skin to continue killing germs even after washing. Please DO NOT use if you have an allergy to CHG or antibacterial soaps.  If your skin becomes reddened/irritated stop using the CHG and inform your nurse when you arrive at Short Stay. Do not shave (including legs and underarms) for at least 48 hours prior to the first CHG shower.  You may shave your face/neck. Please follow these instructions carefully:  1.  Shower with CHG Soap the night before surgery and the  morning of Surgery.  2.  If you choose to wash your hair, wash your hair first as usual with your  normal  shampoo.  3.  After you shampoo, rinse your hair and body thoroughly to remove the  shampoo.                           4.  Use CHG as you would any other liquid soap.  You can apply chg directly  to the skin and  wash                       Gently with a scrungie or clean washcloth.  5.  Apply the CHG Soap to your body ONLY FROM THE NECK DOWN.   Do not use on face/ open                           Wound or open sores. Avoid contact with eyes, ears mouth and genitals (private parts).                       Wash face,  Genitals (private parts) with your normal soap.             6.  Wash thoroughly, paying special attention to the area where your surgery  will be performed.  7.  Thoroughly rinse your body with warm water from the neck down.  8.  DO NOT shower/wash with your normal soap after using and rinsing off  the CHG Soap.  9.  Pat yourself dry with a clean towel.            10.  Wear clean pajamas.            11.  Place clean sheets on your bed the night of your first shower and do not  sleep with pets. Day of Surgery : Do not apply any lotions/deodorants the morning of surgery.  Please wear clean clothes to the hospital/surgery center.  FAILURE TO FOLLOW THESE INSTRUCTIONS MAY RESULT IN THE CANCELLATION OF YOUR SURGERY PATIENT SIGNATURE_________________________________  NURSE SIGNATURE__________________________________  ________________________________________________________________________

## 2021-02-27 ENCOUNTER — Encounter (HOSPITAL_COMMUNITY): Payer: Self-pay

## 2021-02-27 ENCOUNTER — Encounter (HOSPITAL_COMMUNITY)
Admission: RE | Admit: 2021-02-27 | Discharge: 2021-02-27 | Disposition: A | Discharge status: Home / Self Care | Payer: Medicare HMO | Source: Ambulatory Visit | Attending: Orthopedic Surgery | Admitting: Orthopedic Surgery

## 2021-02-27 ENCOUNTER — Other Ambulatory Visit: Payer: Self-pay

## 2021-02-27 VITALS — BP 136/71 | HR 68 | Temp 98.2°F | Resp 16 | Ht 62.0 in | Wt 128.0 lb

## 2021-02-27 DIAGNOSIS — K219 Gastro-esophageal reflux disease without esophagitis: Secondary | ICD-10-CM | POA: Insufficient documentation

## 2021-02-27 DIAGNOSIS — Z87891 Personal history of nicotine dependence: Secondary | ICD-10-CM | POA: Insufficient documentation

## 2021-02-27 DIAGNOSIS — I110 Hypertensive heart disease with heart failure: Secondary | ICD-10-CM | POA: Diagnosis not present

## 2021-02-27 DIAGNOSIS — Z01818 Encounter for other preprocedural examination: Secondary | ICD-10-CM

## 2021-02-27 DIAGNOSIS — M1712 Unilateral primary osteoarthritis, left knee: Secondary | ICD-10-CM | POA: Diagnosis not present

## 2021-02-27 DIAGNOSIS — I509 Heart failure, unspecified: Secondary | ICD-10-CM | POA: Diagnosis not present

## 2021-02-27 HISTORY — DX: Heart failure, unspecified: I50.9

## 2021-02-27 LAB — COMPREHENSIVE METABOLIC PANEL
ALT: 17 U/L (ref 0–44)
AST: 21 U/L (ref 15–41)
Albumin: 4.2 g/dL (ref 3.5–5.0)
Alkaline Phosphatase: 54 U/L (ref 38–126)
Anion gap: 6 (ref 5–15)
BUN: 12 mg/dL (ref 8–23)
CO2: 28 mmol/L (ref 22–32)
Calcium: 9.5 mg/dL (ref 8.9–10.3)
Chloride: 105 mmol/L (ref 98–111)
Creatinine, Ser: 0.55 mg/dL (ref 0.44–1.00)
GFR, Estimated: >60 mL/min (ref >60)
Glucose, Bld: 103 mg/dL — ABNORMAL HIGH (ref 70–99)
Potassium: 4.1 mmol/L (ref 3.5–5.1)
Sodium: 139 mmol/L (ref 135–145)
Total Bilirubin: 0.9 mg/dL (ref 0.3–1.2)
Total Protein: 7.1 g/dL (ref 6.5–8.1)

## 2021-02-27 LAB — CBC
HCT: 39.5 % (ref 36.0–46.0)
Hemoglobin: 13.2 g/dL (ref 12.0–15.0)
MCH: 30.1 pg (ref 26.0–34.0)
MCHC: 33.4 g/dL (ref 30.0–36.0)
MCV: 90 fL (ref 80.0–100.0)
Platelets: 235 10*3/uL (ref 150–400)
RBC: 4.39 MIL/uL (ref 3.87–5.11)
RDW: 11.9 % (ref 11.5–15.5)
WBC: 5.6 10*3/uL (ref 4.0–10.5)
nRBC: 0 % (ref 0.0–0.2)

## 2021-02-27 LAB — TYPE AND SCREEN
ABO/RH(D): A POS
Antibody Screen: NEGATIVE

## 2021-02-27 LAB — SURGICAL PCR SCREEN
MRSA, PCR: NEGATIVE
Staphylococcus aureus: NEGATIVE

## 2021-03-01 NOTE — Progress Notes (Signed)
Anesthesia Chart Review   Case: [id number] Date/Time: 03/12/21 1250   Procedure: TOTAL KNEE ARTHROPLASTY (Left: Knee)   Anesthesia type: Spinal   Pre-op diagnosis: Left knee osteoarthritis   Location: [facility] ROOM 09 / [facility]   Surgeons: Sarah Cancel, MD       DISCUSSION:75 y.o. former smoker with h/o PONV, GERD, nonischemic cardiomyopathy, left knee OA scheduled for above procedure 03/12/21 with Dr. Paralee [name].   Per cardiology preoperative evaluation 01/30/2021, "Chart reviewed as part of pre-operative protocol coverage. Patient was last seen by Dr. Lovena [name] in 07/2020. She was contacted today for additional pre-op evaluation and reported doing well since last visit. She denies any chest pain, shortness of breath, orthopnea, significant palpitations, or syncope. Her activity is limited by her knee pain but she is still able to complete >4.0 METS. Given past medical history and time since last visit, based on ACC/AHA guidelines, [name] would be at acceptable risk for the planned procedure without further cardiovascular testing. "  Anticipate pt can proceed with planned procedure barring acute status change.   VS: BP 136/71    Pulse 68    Temp 36.8 C (Oral)    Resp 16    Ht 5\' 2"  (1.575 m)    Wt 58.1 kg    SpO2 99%    BMI 23.41 kg/m   PROVIDERS: Sarah Koch, NP is PCP   Sarah Carnes, MD is Cardiologist  LABS: Labs reviewed: Acceptable for surgery. (all labs ordered are listed, but only abnormal results are displayed)  Labs Reviewed  COMPREHENSIVE METABOLIC PANEL - Abnormal; Notable for the following components:      Result Value   Glucose, Bld 103 (*)    All other components within normal limits  SURGICAL PCR SCREEN  CBC  TYPE AND SCREEN     IMAGES:   EKG:   CV: Echo 06/08/2019   1. Left ventricular ejection fraction, by estimation, is 50 to 55%. The  left ventricle has low normal function. The left ventricle has no regional  wall motion abnormalities. There is  mild left ventricular hypertrophy of  the basal and septal segments.  Left ventricular diastolic parameters are consistent with Grade I  diastolic dysfunction (impaired relaxation).   2. Right ventricular systolic function is normal. The right ventricular  size is normal. Tricuspid regurgitation signal is inadequate for assessing  PA pressure.   3. Left atrial size was mildly dilated.   4. The mitral valve is normal in structure. Trivial mitral valve  regurgitation.   5. The aortic valve is normal in structure. Aortic valve regurgitation is  not visualized. Mild aortic valve sclerosis is present, with no evidence  of aortic valve stenosis.   6. The inferior vena cava is normal in size with greater than 50%  respiratory variability, suggesting right atrial pressure of 3 mmHg.   Comparison(s): Prior images reviewed side by side. Changes from prior  study are noted. The left ventricular function has improved. 08/13/16 EF  40-45%.  Past Medical History:  Diagnosis Date   Anxiety    when husband passed   Arthritis    generalized   Cardiomyopathy    nonischemic   Cataract    bilateral sx   CHF (congestive heart failure) (HCC)    Chronic back pain    Complication of anesthesia    Erosive esophagitis    LA Classification Grade B   Essential hypertension    on meds   GERD (gastroesophageal reflux disease)  Hiatal hernia    Hyperlipidemia    on meds   Insomnia    MDD (major depressive disorder)    situational - when husband passed   Melanoma ([place])    basil cell, pt reports no melanoma   Migraine    Osteopenia    PONV (postoperative nausea and vomiting)    Rosacea    Sessile colonic polyp 10/2011   adenoma   Squamous carcinoma    scalp    Past Surgical History:  Procedure Laterality Date   APPENDECTOMY     CARDIAC CATHETERIZATION     CATARACT EXTRACTION W/PHACO Right 03/18/2016   Procedure: CATARACT EXTRACTION PHACO AND INTRAOCULAR LENS PLACEMENT ([place]);  Surgeon:  Birder [name], MD;  Location: [facility];  Service: Ophthalmology;  Laterality: Right;  [place] 45.3 AP% 22.2 CDE 10.07 Fluid pack lot # [id number]   CATARACT EXTRACTION W/PHACO Left 04/08/2016   Procedure: CATARACT EXTRACTION PHACO AND INTRAOCULAR LENS PLACEMENT ([facility]);  Surgeon: Birder [name], MD;  Location: [facility];  Service: Ophthalmology;  Laterality: Left;  [place] 00:41 AP% 15.0 CDE 6.27 Fluid pack lot # [id number]   LOOP RECORDER INSERTION N/A 10/10/2016   Procedure: Loop Recorder Insertion;  Surgeon: Evans Lance, MD;  Location: [place] [facility];  Service: Cardiovascular;  Laterality: N/A;   LOOP RECORDER REMOVAL  07/2020   SKIN CANCER EXCISION     scalp   TUBAL LIGATION     VAGINAL HYSTERECTOMY     WISDOM TOOTH EXTRACTION      MEDICATIONS:  acetaminophen (TYLENOL) 500 MG tablet   amLODipine (NORVASC) 2.5 MG tablet   carvedilol (COREG) 12.5 MG tablet   Cholecalciferol (VITAMIN D3) 2000 units capsule   enalapril (VASOTEC) 10 MG tablet   escitalopram (LEXAPRO) 20 MG tablet   famotidine (PEPCID) 40 MG tablet   ibuprofen (ADVIL) 200 MG tablet   metroNIDAZOLE (METROCREAM) 0.75 % cream   Multiple Vitamin (MULTIVITAMIN) capsule   nitroGLYCERIN (NITROSTAT) 0.4 MG SL tablet   pantoprazole (PROTONIX) 40 MG tablet   Polyvinyl Alcohol-Povidone (REFRESH OP)   promethazine (PHENERGAN) 12.5 MG tablet   rosuvastatin (CRESTOR) 20 MG tablet   traMADol (ULTRAM) 50 MG tablet   zolpidem (AMBIEN) 10 MG tablet   No current facility-administered medications for this encounter.    Sarah Felix Ward, PA-C [facility] [phone]

## 2021-03-08 ENCOUNTER — Other Ambulatory Visit: Payer: Self-pay

## 2021-03-08 ENCOUNTER — Encounter (HOSPITAL_COMMUNITY)
Admission: RE | Admit: 2021-03-08 | Discharge: 2021-03-08 | Disposition: A | Discharge status: Home / Self Care | Payer: Medicare HMO | Source: Ambulatory Visit | Attending: Orthopedic Surgery | Admitting: Orthopedic Surgery

## 2021-03-08 DIAGNOSIS — Z20822 Contact with and (suspected) exposure to covid-19: Secondary | ICD-10-CM | POA: Diagnosis not present

## 2021-03-08 DIAGNOSIS — Z01812 Encounter for preprocedural laboratory examination: Secondary | ICD-10-CM | POA: Diagnosis not present

## 2021-03-08 DIAGNOSIS — Z01818 Encounter for other preprocedural examination: Secondary | ICD-10-CM

## 2021-03-08 LAB — SARS CORONAVIRUS 2 (TAT 6-24 HRS): SARS Coronavirus 2: NEGATIVE

## 2021-03-11 ENCOUNTER — Encounter (HOSPITAL_COMMUNITY): Payer: Self-pay | Admitting: Orthopedic Surgery

## 2021-03-11 NOTE — H&P (Signed)
TOTAL KNEE ADMISSION H&P  Patient is being admitted for left total knee arthroplasty.  Subjective:  Chief Complaint:left knee pain.  HPI: [name], 76 y.o. female, has a history of pain and functional disability in the left knee due to arthritis and has failed non-surgical conservative treatments for greater than 12 weeks to includeNSAID's and/or analgesics, corticosteriod injections, and activity modification.  Onset of symptoms was gradual, starting 2 years ago with gradually worsening course since that time. The patient noted no past surgery on the left knee(s).  Patient currently rates pain in the left knee(s) at 7 out of 10 with activity. Patient has worsening of pain with activity and weight bearing and pain that interferes with activities of daily living.  Patient has evidence of joint space narrowing by imaging studies.  There is no active infection.  Patient Active Problem List   Diagnosis Date Noted   Foul smelling urine 12/14/2019   Pain in right hand 04/21/2019   Acute pain of left shoulder 04/05/2018   Preventative health care 10/23/2017   Elevated rheumatoid factor 01/08/2017   Syncope 10/10/2016   Near syncope 08/19/2016   Chronic back pain 07/17/2015   Eustachian tube dysfunction 10/27/2014   Frequent headaches 07/27/2013   Anxiety and depression 01/05/2013   Insomnia 07/12/2012   Grief reaction 07/12/2012   HIATAL HERNIA 10/08/2007   COLONIC POLYPS, HYPERPLASTIC, HX OF 10/08/2007   Cardiomyopathy ([place]) 06/22/2007   EROSIVE ESOPHAGITIS 06/22/2007   GERD 06/22/2007   HYPERCHOLESTEROLEMIA, PURE 08/11/2006   Past Medical History:  Diagnosis Date   Anxiety    when husband passed   Arthritis    generalized   Cardiomyopathy    nonischemic   Cataract    bilateral sx   CHF (congestive heart failure) (HCC)    Chronic back pain    Complication of anesthesia    Erosive esophagitis    LA Classification Grade B   Essential hypertension    on meds   GERD  (gastroesophageal reflux disease)    Hiatal hernia    Hyperlipidemia    on meds   Insomnia    MDD (major depressive disorder)    situational - when husband passed   Melanoma ( [place])    basil cell, pt reports no melanoma   Migraine    Osteopenia    PONV (postoperative nausea and vomiting)    Rosacea    Sessile colonic polyp 10/2011   adenoma   Squamous carcinoma    scalp    Past Surgical History:  Procedure Laterality Date   APPENDECTOMY     CARDIAC CATHETERIZATION     CATARACT EXTRACTION W/PHACO Right 03/18/2016   Procedure: CATARACT EXTRACTION PHACO AND INTRAOCULAR LENS PLACEMENT ([place]);  Surgeon: Birder [name], MD;  Location: [facility];  Service: Ophthalmology;  Laterality: Right;  [place] 45.3 AP% 22.2 CDE 10.07 Fluid pack lot # [id number]   CATARACT EXTRACTION W/PHACO Left 04/08/2016   Procedure: CATARACT EXTRACTION PHACO AND INTRAOCULAR LENS PLACEMENT ([facility]);  Surgeon: Birder [name], MD;  Location: [facility];  Service: Ophthalmology;  Laterality: Left;  [place] 00:41 AP% 15.0 CDE 6.27 Fluid pack lot # [id number]   LOOP RECORDER INSERTION N/A 10/10/2016   Procedure: Loop Recorder Insertion;  Surgeon: Evans Lance, MD;  Location: [place] [facility];  Service: Cardiovascular;  Laterality: N/A;   LOOP RECORDER REMOVAL  07/2020   SKIN CANCER EXCISION     scalp   TUBAL LIGATION     VAGINAL HYSTERECTOMY  WISDOM TOOTH EXTRACTION      No current facility-administered medications for this encounter.   Current Outpatient Medications  Medication Sig Dispense Refill Last Dose   acetaminophen (TYLENOL) 500 MG tablet Take 1,000 mg by mouth every 6 (six) hours as needed for moderate pain or headache.      amLODipine (NORVASC) 2.5 MG tablet TAKE ONE TABLET EVERY DAY 90 tablet 3    carvedilol (COREG) 12.5 MG tablet Take 1 tablet (12.5 mg total) by mouth 2 (two) times daily with a meal. 180 tablet 3    Cholecalciferol (VITAMIN D3) 2000 units capsule Take 2,000 Units by mouth daily.       enalapril (VASOTEC) 10 MG tablet TAKE ONE TABLET EVERY DAY 90 tablet 3    escitalopram (LEXAPRO) 20 MG tablet Take 1 tablet (20 mg total) by mouth daily. For anxiety. Office visit required for further refills. (Patient taking differently: Take 20 mg by mouth at bedtime. For anxiety. Office visit required for further refills.) 60 tablet 0    famotidine (PEPCID) 40 MG tablet Take 1 tablet (40 mg total) by mouth at bedtime. 90 tablet 4    ibuprofen (ADVIL) 200 MG tablet Take 400 mg by mouth every 6 (six) hours as needed for mild pain.      metroNIDAZOLE (METROCREAM) 0.75 % cream Apply topically daily as needed (rash).      Multiple Vitamin (MULTIVITAMIN) capsule Take 1 capsule by mouth daily.      pantoprazole (PROTONIX) 40 MG tablet TAKE 1 TABLET BY MOUTH TWICE DAILY AS NEEDED FOR HEARTBURN (Patient taking differently: 40 mg 2 (two) times daily.) 180 tablet 0    promethazine (PHENERGAN) 12.5 MG tablet TAKE ONE TABLET BY MOUTH TWICE DAILY AS NEEDED FOR NAUSEA OR VOMITING. Office visit required for further refills. (Patient taking differently: Take 12.5 mg by mouth 2 (two) times daily as needed for nausea or vomiting. Marland Kitchen) 20 tablet 0    rosuvastatin (CRESTOR) 20 MG tablet TAKE ONE TABLET EVERY DAY FOR CHOLESTEROL (Patient taking differently: Take 20 mg by mouth at bedtime.) 90 tablet 3    traMADol (ULTRAM) 50 MG tablet Take 50 mg by mouth at bedtime.      zolpidem (AMBIEN) 10 MG tablet Take 1 tablet (10 mg total) by mouth at bedtime as needed for sleep. Office visit required for further refills. 90 tablet 0    nitroGLYCERIN (NITROSTAT) 0.4 MG SL tablet Place 1 tablet (0.4 mg total) under the tongue every 5 (five) minutes as needed for chest pain. 30 tablet 3    Polyvinyl Alcohol-Povidone (REFRESH OP) Place 1 drop into both eyes daily as needed (dry eyes). Eye drops w Omega three oil ingredients      Allergies  Allergen Reactions   Penicillin G Swelling and Rash   Penicillins Anaphylaxis and Hives    Has  patient had a PCN reaction causing immediate rash, facial/tongue/throat swelling, SOB or lightheadedness with hypotension: {Yes Has patient had a PCN reaction causing severe rash involving mucus membranes or skin necrosis: {no Has patient had a PCN reaction that required hospitalization {yes Has patient had a PCN reaction occurring within the last 10 years: {no If all of the above answers are "NO", then may proceed with Cephalosporin use.   Sulfa Antibiotics Hives   Sulfasalazine Hives   Cefaclor Itching and Rash   Cefuroxime Axetil Itching and Rash   Clindamycin Itching and Rash   Clindamycin/Lincomycin Itching and Rash   Lansoprazole Other (See Comments)  REACTION: headaches   Latex Itching and Rash   Lincomycin Itching and Rash   Lincomycin Hcl Itching and Rash   Sulfamethoxazole-Trimethoprim Rash    Social History   Tobacco Use   Smoking status: Former    Types: Cigarettes    Quit date: 06/03/1981    Years since quitting: 39.7   Smokeless tobacco: Never  Substance Use Topics   Alcohol use: Yes    Alcohol/week: 4.0 standard drinks    Types: 4 Glasses of wine per week    Comment: Wine per night    Family History  Problem Relation Age of Onset   Alzheimer's disease Mother    Heart disease Father    Arthritis/Rheumatoid Sister    Colon cancer Neg Hx    Stomach cancer Neg Hx    Rectal cancer Neg Hx    Esophageal cancer Neg Hx    Colon polyps Neg Hx      Review of Systems  Constitutional:  Negative for chills and fever.  Respiratory:  Negative for cough and shortness of breath.   Cardiovascular:  Negative for chest pain.  Gastrointestinal:  Negative for nausea and vomiting.  Musculoskeletal:  Positive for arthralgias.    Objective:  Physical Exam Well nourished and well developed. General: Alert and oriented x3, cooperative and pleasant, no acute distress. Head: normocephalic, atraumatic, neck supple. Eyes: EOMI.  Musculoskeletal: Left knee exam: No  palpable effusion, warmth or erythema Neutral to slight varus with only a slight flexion contracture with flexion to 120 degrees with some tightness Tenderness more medial and anterior Stable and intact medial and lateral collateral ligaments  Calves soft and nontender. Motor function intact in LE. Strength 5/5 LE bilaterally. Neuro: Distal pulses 2+. Sensation to light touch intact in LE.  Vital signs in last 24 hours:    Labs:   Estimated body mass index is 23.41 kg/m as calculated from the following:   Height as of 02/27/21: 5\' 2"  (1.575 m).   Weight as of 02/27/21: 58.1 kg.   Imaging Review Plain radiographs demonstrate severe degenerative joint disease of the left knee(s). The overall alignment isneutral. The bone quality appears to be adequate for age and reported activity level.      Assessment/Plan:  End stage arthritis, left knee   The patient history, physical examination, clinical judgment of the provider and imaging studies are consistent with end stage degenerative joint disease of the left knee(s) and total knee arthroplasty is deemed medically necessary. The treatment options including medical management, injection therapy arthroscopy and arthroplasty were discussed at length. The risks and benefits of total knee arthroplasty were presented and reviewed. The risks due to aseptic loosening, infection, stiffness, patella tracking problems, thromboembolic complications and other imponderables were discussed. The patient acknowledged the explanation, agreed to proceed with the plan and consent was signed. Patient is being admitted for inpatient treatment for surgery, pain control, PT, OT, prophylactic antibiotics, VTE prophylaxis, progressive ambulation and ADL's and discharge planning. The patient is planning to be discharged  home.  Therapy Plans: outpatient therapy at [facility] x1 week, then EO in [place] on 1/16 Disposition: Home with daughter Planned DVT  Prophylaxis: aspirin 81mg  BID DME needed: walker PCP: Domingo Madeira Cardiologist: Dr. Dorris Carnes, clearance received Gastroenterologist: Dr. Fuller Plan (esophageal issues) TXA: IV Allergies: PCN - anaphylaxis, cephalosporins - rash, itching, latex - skin irritation, clindamycin - rash, lincomycin - rash, sulfa - rash Anesthesia Concerns: none BMI: 23.8 Last HgbA1c: Not diabetic  Other: - tramadol/dilaudid (nausea with codeine),  robaxin, meloxicam, tylenol, zofran - Only vancomycin for abx   Patient's anticipated LOS is less than 2 midnights, meeting these requirements: - Younger than 61 - Lives within 1 hour of care - Has a competent adult at home to recover with post-op recover - NO history of  - Chronic pain requiring opiods  - Diabetes  - Coronary Artery Disease  - Heart failure  - Heart attack  - Stroke  - DVT/VTE  - Cardiac arrhythmia  - Respiratory Failure/COPD  - Renal failure  - Anemia  - Advanced Liver disease  Costella Hatcher, PA-C Orthopedic Surgery [facility] [place] [phone]

## 2021-03-12 ENCOUNTER — Encounter (HOSPITAL_COMMUNITY)
Admission: RE | Disposition: A | Discharge status: Home / Self Care | Payer: Self-pay | Source: Ambulatory Visit | Attending: Orthopedic Surgery

## 2021-03-12 ENCOUNTER — Other Ambulatory Visit: Payer: Self-pay

## 2021-03-12 ENCOUNTER — Ambulatory Visit (HOSPITAL_COMMUNITY): Payer: Medicare HMO | Admitting: Anesthesiology

## 2021-03-12 ENCOUNTER — Ambulatory Visit (HOSPITAL_COMMUNITY): Payer: Medicare HMO | Admitting: Physician Assistant

## 2021-03-12 ENCOUNTER — Encounter (HOSPITAL_COMMUNITY): Payer: Self-pay | Admitting: Orthopedic Surgery

## 2021-03-12 ENCOUNTER — Observation Stay (HOSPITAL_COMMUNITY)
Admission: RE | Admit: 2021-03-12 | Discharge: 2021-03-13 | Disposition: A | Discharge status: Home / Self Care | Payer: Medicare HMO | Source: Ambulatory Visit | Attending: Orthopedic Surgery | Admitting: Orthopedic Surgery

## 2021-03-12 DIAGNOSIS — Z96652 Presence of left artificial knee joint: Secondary | ICD-10-CM

## 2021-03-12 DIAGNOSIS — G8918 Other acute postprocedural pain: Secondary | ICD-10-CM | POA: Diagnosis not present

## 2021-03-12 DIAGNOSIS — Z85820 Personal history of malignant melanoma of skin: Secondary | ICD-10-CM | POA: Insufficient documentation

## 2021-03-12 DIAGNOSIS — I110 Hypertensive heart disease with heart failure: Secondary | ICD-10-CM | POA: Diagnosis not present

## 2021-03-12 DIAGNOSIS — Z79899 Other long term (current) drug therapy: Secondary | ICD-10-CM | POA: Insufficient documentation

## 2021-03-12 DIAGNOSIS — I509 Heart failure, unspecified: Secondary | ICD-10-CM | POA: Diagnosis not present

## 2021-03-12 DIAGNOSIS — M1712 Unilateral primary osteoarthritis, left knee: Secondary | ICD-10-CM | POA: Diagnosis not present

## 2021-03-12 DIAGNOSIS — Z01818 Encounter for other preprocedural examination: Secondary | ICD-10-CM

## 2021-03-12 DIAGNOSIS — Z87891 Personal history of nicotine dependence: Secondary | ICD-10-CM | POA: Insufficient documentation

## 2021-03-12 DIAGNOSIS — Z91040 Latex allergy status: Secondary | ICD-10-CM | POA: Diagnosis not present

## 2021-03-12 HISTORY — PX: TOTAL KNEE ARTHROPLASTY: SHX125

## 2021-03-12 LAB — ABO/RH: ABO/RH(D): A POS

## 2021-03-12 SURGERY — ARTHROPLASTY, KNEE, TOTAL
Anesthesia: Spinal | Site: Knee | Laterality: Left

## 2021-03-12 MED ORDER — ASPIRIN 81 MG PO CHEW
81.0000 mg | CHEWABLE_TABLET | Freq: Two times a day (BID) | ORAL | Status: DC
  Administered 2021-03-12 – 2021-03-13 (×2): 81 mg via ORAL
  Filled 2021-03-12 (×2): qty 1

## 2021-03-12 MED ORDER — METOCLOPRAMIDE HCL 5 MG/ML IJ SOLN: 5.0000 mg | Freq: Three times a day (TID) | INTRAMUSCULAR | Status: DC | PRN

## 2021-03-12 MED ORDER — ENALAPRIL MALEATE 10 MG PO TABS
10.0000 mg | ORAL_TABLET | Freq: Every day | ORAL | Status: DC
  Filled 2021-03-12: qty 1

## 2021-03-12 MED ORDER — ROSUVASTATIN CALCIUM 20 MG PO TABS
20.0000 mg | ORAL_TABLET | Freq: Every day | ORAL | Status: DC
  Administered 2021-03-12: 20 mg via ORAL
  Filled 2021-03-12: qty 1

## 2021-03-12 MED ORDER — CHLORHEXIDINE GLUCONATE CLOTH 2 % EX PADS
6.0000 | MEDICATED_PAD | Freq: Every day | CUTANEOUS | Status: DC
  Administered 2021-03-13: 6 via TOPICAL

## 2021-03-12 MED ORDER — FENTANYL CITRATE PF 50 MCG/ML IJ SOSY
50.0000 ug | PREFILLED_SYRINGE | INTRAMUSCULAR | Status: DC
  Administered 2021-03-12: 50 ug via INTRAVENOUS
  Filled 2021-03-12: qty 2

## 2021-03-12 MED ORDER — METOCLOPRAMIDE HCL 5 MG PO TABS
5.0000 mg | ORAL_TABLET | Freq: Three times a day (TID) | ORAL | Status: DC | PRN
  Filled 2021-03-12: qty 2

## 2021-03-12 MED ORDER — ONDANSETRON HCL 4 MG/2ML IJ SOLN: 4.0000 mg | Freq: Once | INTRAMUSCULAR | Status: DC | PRN

## 2021-03-12 MED ORDER — ACETAMINOPHEN 10 MG/ML IV SOLN
1000.0000 mg | Freq: Once | INTRAVENOUS | Status: DC | PRN
  Administered 2021-03-12: 1000 mg via INTRAVENOUS

## 2021-03-12 MED ORDER — PROPOFOL 500 MG/50ML IV EMUL
INTRAVENOUS | Status: DC | PRN
  Administered 2021-03-12: 100 ug/kg/min via INTRAVENOUS

## 2021-03-12 MED ORDER — 0.9 % SODIUM CHLORIDE (POUR BTL) OPTIME
TOPICAL | Status: DC | PRN
  Administered 2021-03-12: 1000 mL

## 2021-03-12 MED ORDER — METHOCARBAMOL 500 MG PO TABS
500.0000 mg | ORAL_TABLET | Freq: Four times a day (QID) | ORAL | Status: DC | PRN
  Administered 2021-03-12: 500 mg via ORAL
  Filled 2021-03-12: qty 1

## 2021-03-12 MED ORDER — OXYCODONE HCL 5 MG PO TABS: 5.0000 mg | ORAL_TABLET | Freq: Once | ORAL | Status: DC | PRN

## 2021-03-12 MED ORDER — CHLORHEXIDINE GLUCONATE 0.12 % MT SOLN
15.0000 mL | Freq: Once | OROMUCOSAL | Status: AC
  Administered 2021-03-12: 15 mL via OROMUCOSAL

## 2021-03-12 MED ORDER — DEXAMETHASONE SODIUM PHOSPHATE 10 MG/ML IJ SOLN
10.0000 mg | Freq: Once | INTRAMUSCULAR | Status: AC
  Administered 2021-03-13: 10 mg via INTRAVENOUS
  Filled 2021-03-12: qty 1

## 2021-03-12 MED ORDER — OXYCODONE HCL 5 MG/5ML PO SOLN: 5.0000 mg | Freq: Once | ORAL | Status: DC | PRN

## 2021-03-12 MED ORDER — MIDAZOLAM HCL 2 MG/2ML IJ SOLN
1.0000 mg | INTRAMUSCULAR | Status: DC
  Administered 2021-03-12: 2 mg via INTRAVENOUS
  Filled 2021-03-12: qty 2

## 2021-03-12 MED ORDER — DOCUSATE SODIUM 100 MG PO CAPS
100.0000 mg | ORAL_CAPSULE | Freq: Two times a day (BID) | ORAL | Status: DC
  Administered 2021-03-12 – 2021-03-13 (×2): 100 mg via ORAL
  Filled 2021-03-12 (×2): qty 1

## 2021-03-12 MED ORDER — PHENYLEPHRINE HCL-NACL 20-0.9 MG/250ML-% IV SOLN
INTRAVENOUS | Status: DC | PRN
  Administered 2021-03-12: 10 ug/min via INTRAVENOUS

## 2021-03-12 MED ORDER — BUPIVACAINE IN DEXTROSE 0.75-8.25 % IT SOLN
INTRATHECAL | Status: DC | PRN
  Administered 2021-03-12: 1.4 mL via INTRATHECAL

## 2021-03-12 MED ORDER — BUPIVACAINE-EPINEPHRINE (PF) 0.25% -1:200000 IJ SOLN
INTRAMUSCULAR | Status: DC | PRN
  Administered 2021-03-12: 30 mL

## 2021-03-12 MED ORDER — HYDROMORPHONE HCL 1 MG/ML IJ SOLN
INTRAMUSCULAR | Status: AC
  Administered 2021-03-12: 0.5 mg via INTRAVENOUS
  Filled 2021-03-12: qty 1

## 2021-03-12 MED ORDER — ESCITALOPRAM OXALATE 20 MG PO TABS
20.0000 mg | ORAL_TABLET | Freq: Every day | ORAL | Status: DC
  Administered 2021-03-12: 20 mg via ORAL
  Filled 2021-03-12: qty 1

## 2021-03-12 MED ORDER — ROPIVACAINE HCL 5 MG/ML IJ SOLN
INTRAMUSCULAR | Status: DC | PRN
  Administered 2021-03-12: 20 mL via PERINEURAL

## 2021-03-12 MED ORDER — LACTATED RINGERS IV SOLN: INTRAVENOUS | Status: DC

## 2021-03-12 MED ORDER — SODIUM CHLORIDE 0.9 % IR SOLN
Status: DC | PRN
Start: 2021-03-12 — End: 2021-03-12
  Administered 2021-03-12: 1000 mL

## 2021-03-12 MED ORDER — DIPHENHYDRAMINE HCL 12.5 MG/5ML PO ELIX
12.5000 mg | ORAL_SOLUTION | ORAL | Status: DC | PRN
  Administered 2021-03-13: 25 mg via ORAL
  Filled 2021-03-12 (×2): qty 10

## 2021-03-12 MED ORDER — ACETAMINOPHEN 10 MG/ML IV SOLN
INTRAVENOUS | Status: AC
  Filled 2021-03-12: qty 100

## 2021-03-12 MED ORDER — ONDANSETRON HCL 4 MG/2ML IJ SOLN: 4.0000 mg | Freq: Four times a day (QID) | INTRAMUSCULAR | Status: DC | PRN

## 2021-03-12 MED ORDER — PROMETHAZINE HCL 25 MG PO TABS
12.5000 mg | ORAL_TABLET | Freq: Two times a day (BID) | ORAL | Status: DC | PRN
  Filled 2021-03-12: qty 1

## 2021-03-12 MED ORDER — AMLODIPINE BESYLATE 2.5 MG PO TABS
2.5000 mg | ORAL_TABLET | Freq: Every day | ORAL | Status: DC
  Administered 2021-03-13: 2.5 mg via ORAL
  Filled 2021-03-12: qty 1

## 2021-03-12 MED ORDER — FAMOTIDINE 20 MG PO TABS
40.0000 mg | ORAL_TABLET | Freq: Every day | ORAL | Status: DC
  Administered 2021-03-12: 40 mg via ORAL
  Filled 2021-03-12: qty 2

## 2021-03-12 MED ORDER — TRAMADOL HCL 50 MG PO TABS
50.0000 mg | ORAL_TABLET | Freq: Four times a day (QID) | ORAL | Status: DC | PRN
  Administered 2021-03-12 – 2021-03-13 (×2): 100 mg via ORAL
  Filled 2021-03-12 (×2): qty 2

## 2021-03-12 MED ORDER — TRANEXAMIC ACID-NACL 1000-0.7 MG/100ML-% IV SOLN: 1000.0000 mg | Freq: Once | INTRAVENOUS | Status: AC

## 2021-03-12 MED ORDER — METHOCARBAMOL 500 MG IVPB - SIMPLE MED
INTRAVENOUS | Status: AC
  Administered 2021-03-12: 500 mg via INTRAVENOUS
  Filled 2021-03-12: qty 50

## 2021-03-12 MED ORDER — SODIUM CHLORIDE (PF) 0.9 % IJ SOLN
INTRAMUSCULAR | Status: DC | PRN
  Administered 2021-03-12: 30 mL

## 2021-03-12 MED ORDER — FERROUS SULFATE 325 (65 FE) MG PO TABS
325.0000 mg | ORAL_TABLET | Freq: Three times a day (TID) | ORAL | Status: DC
  Administered 2021-03-13: 325 mg via ORAL
  Filled 2021-03-12: qty 1

## 2021-03-12 MED ORDER — HYDROMORPHONE HCL 1 MG/ML IJ SOLN: 0.5000 mg | INTRAMUSCULAR | Status: DC | PRN

## 2021-03-12 MED ORDER — KETOROLAC TROMETHAMINE 30 MG/ML IJ SOLN
INTRAMUSCULAR | Status: AC
  Filled 2021-03-12: qty 1

## 2021-03-12 MED ORDER — BISACODYL 10 MG RE SUPP
10.0000 mg | Freq: Every day | RECTAL | Status: DC | PRN
  Filled 2021-03-12: qty 1

## 2021-03-12 MED ORDER — KETOROLAC TROMETHAMINE 30 MG/ML IJ SOLN
INTRAMUSCULAR | Status: DC | PRN
  Administered 2021-03-12: 30 mg via INTRA_ARTICULAR

## 2021-03-12 MED ORDER — POVIDONE-IODINE 10 % EX SWAB
2.0000 "application " | Freq: Once | CUTANEOUS | Status: AC
  Administered 2021-03-12: 2 via TOPICAL

## 2021-03-12 MED ORDER — PROPOFOL 10 MG/ML IV BOLUS
INTRAVENOUS | Status: DC | PRN
  Administered 2021-03-12: 20 mg via INTRAVENOUS
  Administered 2021-03-12: 10 mg via INTRAVENOUS

## 2021-03-12 MED ORDER — CARVEDILOL 12.5 MG PO TABS
12.5000 mg | ORAL_TABLET | Freq: Two times a day (BID) | ORAL | Status: DC
Start: 2021-03-12 — End: 2021-03-13
  Administered 2021-03-12 – 2021-03-13 (×2): 12.5 mg via ORAL
  Filled 2021-03-12 (×3): qty 1

## 2021-03-12 MED ORDER — DEXAMETHASONE SODIUM PHOSPHATE 10 MG/ML IJ SOLN: 8.0000 mg | Freq: Once | INTRAMUSCULAR | Status: DC

## 2021-03-12 MED ORDER — KETOROLAC TROMETHAMINE 30 MG/ML IJ SOLN
INTRAMUSCULAR | Status: AC
  Administered 2021-03-12: 7.5 mg
  Filled 2021-03-12: qty 1

## 2021-03-12 MED ORDER — PANTOPRAZOLE SODIUM 40 MG PO TBEC
40.0000 mg | DELAYED_RELEASE_TABLET | Freq: Two times a day (BID) | ORAL | Status: DC
  Administered 2021-03-12 – 2021-03-13 (×2): 40 mg via ORAL
  Filled 2021-03-12 (×2): qty 1

## 2021-03-12 MED ORDER — ZOLPIDEM TARTRATE 5 MG PO TABS: 5.0000 mg | ORAL_TABLET | Freq: Every evening | ORAL | Status: DC | PRN

## 2021-03-12 MED ORDER — SODIUM CHLORIDE 0.9 % IV SOLN: INTRAVENOUS | Status: DC

## 2021-03-12 MED ORDER — TRANEXAMIC ACID-NACL 1000-0.7 MG/100ML-% IV SOLN
1000.0000 mg | INTRAVENOUS | Status: AC
  Administered 2021-03-12: 1000 mg via INTRAVENOUS
  Filled 2021-03-12: qty 100

## 2021-03-12 MED ORDER — VANCOMYCIN HCL IN DEXTROSE 1-5 GM/200ML-% IV SOLN
1000.0000 mg | Freq: Two times a day (BID) | INTRAVENOUS | Status: AC
  Administered 2021-03-13: 1000 mg via INTRAVENOUS
  Filled 2021-03-12: qty 200

## 2021-03-12 MED ORDER — TRANEXAMIC ACID-NACL 1000-0.7 MG/100ML-% IV SOLN
INTRAVENOUS | Status: AC
  Administered 2021-03-12: 1000 mg via INTRAVENOUS
  Filled 2021-03-12: qty 100

## 2021-03-12 MED ORDER — VANCOMYCIN HCL IN DEXTROSE 1-5 GM/200ML-% IV SOLN
1000.0000 mg | INTRAVENOUS | Status: AC
  Administered 2021-03-12: 1000 mg via INTRAVENOUS
  Filled 2021-03-12: qty 200

## 2021-03-12 MED ORDER — KETOROLAC TROMETHAMINE 15 MG/ML IJ SOLN
7.5000 mg | Freq: Four times a day (QID) | INTRAMUSCULAR | Status: AC
  Administered 2021-03-12 – 2021-03-13 (×4): 7.5 mg via INTRAVENOUS
  Filled 2021-03-12 (×2): qty 1

## 2021-03-12 MED ORDER — HYDROMORPHONE HCL 1 MG/ML IJ SOLN
0.2500 mg | INTRAMUSCULAR | Status: DC | PRN
  Administered 2021-03-12 (×2): 0.5 mg via INTRAVENOUS

## 2021-03-12 MED ORDER — BUPIVACAINE-EPINEPHRINE (PF) 0.25% -1:200000 IJ SOLN
INTRAMUSCULAR | Status: AC
  Filled 2021-03-12: qty 30

## 2021-03-12 MED ORDER — METHOCARBAMOL 500 MG IVPB - SIMPLE MED
500.0000 mg | Freq: Four times a day (QID) | INTRAVENOUS | Status: DC | PRN
  Filled 2021-03-12: qty 50

## 2021-03-12 MED ORDER — ONDANSETRON HCL 4 MG PO TABS
4.0000 mg | ORAL_TABLET | Freq: Four times a day (QID) | ORAL | Status: DC | PRN
  Filled 2021-03-12: qty 1

## 2021-03-12 MED ORDER — MENTHOL 3 MG MT LOZG
1.0000 | LOZENGE | OROMUCOSAL | Status: DC | PRN
  Filled 2021-03-12: qty 9

## 2021-03-12 MED ORDER — DEXAMETHASONE SODIUM PHOSPHATE 10 MG/ML IJ SOLN
INTRAMUSCULAR | Status: DC | PRN
  Administered 2021-03-12: 4 mg via INTRAVENOUS

## 2021-03-12 MED ORDER — PHENOL 1.4 % MT LIQD
1.0000 | OROMUCOSAL | Status: DC | PRN
  Filled 2021-03-12: qty 177

## 2021-03-12 MED ORDER — ORAL CARE MOUTH RINSE: 15.0000 mL | Freq: Once | OROMUCOSAL | Status: AC

## 2021-03-12 MED ORDER — ONDANSETRON HCL 4 MG/2ML IJ SOLN
INTRAMUSCULAR | Status: DC | PRN
  Administered 2021-03-12: 4 mg via INTRAVENOUS

## 2021-03-12 MED ORDER — LIDOCAINE 2% (20 MG/ML) 5 ML SYRINGE
INTRAMUSCULAR | Status: DC | PRN
Start: 2021-03-12 — End: 2021-03-12
  Administered 2021-03-12: 50 mg via INTRAVENOUS

## 2021-03-12 MED ORDER — ACETAMINOPHEN 325 MG PO TABS
325.0000 mg | ORAL_TABLET | Freq: Four times a day (QID) | ORAL | Status: DC | PRN
  Administered 2021-03-13: 650 mg via ORAL
  Filled 2021-03-12: qty 2

## 2021-03-12 MED ORDER — HYDROMORPHONE HCL 2 MG PO TABS
2.0000 mg | ORAL_TABLET | ORAL | Status: DC | PRN
  Administered 2021-03-13: 2 mg via ORAL
  Filled 2021-03-12: qty 1

## 2021-03-12 MED ORDER — POLYETHYLENE GLYCOL 3350 17 G PO PACK
17.0000 g | PACK | Freq: Every day | ORAL | Status: DC | PRN
  Filled 2021-03-12: qty 1

## 2021-03-12 SURGICAL SUPPLY — 52 items
ATTUNE MED ANAT PAT 35 KNEE (Knees) ×1 IMPLANT
ATTUNE PSFEM LTSZ4 NARCEM KNEE (Femur) ×1 IMPLANT
ATTUNE PSRP INSR SZ4 6 KNEE (Insert) ×1 IMPLANT
BAG COUNTER SPONGE SURGICOUNT (BAG) IMPLANT
BAG ZIPLOCK 12X15 (MISCELLANEOUS) IMPLANT
BASE TIBIAL ROT PLAT SZ 3 KNEE (Knees) IMPLANT
BLADE SAW SGTL 11.0X1.19X90.0M (BLADE) IMPLANT
BLADE SAW SGTL 13.0X1.19X90.0M (BLADE) ×2 IMPLANT
BLADE SURG SZ10 CARB STEEL (BLADE) ×4 IMPLANT
BNDG ELASTIC 4X5.8 VLCR STR LF (GAUZE/BANDAGES/DRESSINGS) ×1 IMPLANT
BNDG ELASTIC 6X5.8 VLCR STR LF (GAUZE/BANDAGES/DRESSINGS) ×2 IMPLANT
BOWL SMART MIX CTS (DISPOSABLE) ×2 IMPLANT
CEMENT HV SMART SET (Cement) ×2 IMPLANT
CUFF TOURN SGL QUICK 34 (TOURNIQUET CUFF) ×2
CUFF TRNQT CYL 34X4.125X (TOURNIQUET CUFF) ×1 IMPLANT
DECANTER SPIKE VIAL GLASS SM (MISCELLANEOUS) ×4 IMPLANT
DERMABOND ADVANCED (GAUZE/BANDAGES/DRESSINGS) ×1
DERMABOND ADVANCED .7 DNX12 (GAUZE/BANDAGES/DRESSINGS) ×1 IMPLANT
DRAPE INCISE IOBAN 66X45 STRL (DRAPES) ×2 IMPLANT
DRAPE U-SHAPE 47X51 STRL (DRAPES) ×2 IMPLANT
DRESSING AQUACEL AG SP 3.5X10 (GAUZE/BANDAGES/DRESSINGS) ×1 IMPLANT
DRSG AQUACEL AG ADV 3.5X10 (GAUZE/BANDAGES/DRESSINGS) ×1 IMPLANT
DRSG AQUACEL AG SP 3.5X10 (GAUZE/BANDAGES/DRESSINGS) ×2
DURAPREP 26ML APPLICATOR (WOUND CARE) ×4 IMPLANT
ELECT REM PT RETURN 15FT ADLT (MISCELLANEOUS) ×2 IMPLANT
GLOVE SURG ENC MOIS LTX SZ6 (GLOVE) ×2 IMPLANT
GLOVE SURG ENC MOIS LTX SZ7 (GLOVE) ×2 IMPLANT
GLOVE SURG UNDER POLY LF SZ7.5 (GLOVE) ×2 IMPLANT
GOWN STRL REUS W/TWL LRG LVL3 (GOWN DISPOSABLE) ×2 IMPLANT
HANDPIECE INTERPULSE COAX TIP (DISPOSABLE) ×2
HOLDER FOLEY CATH W/STRAP (MISCELLANEOUS) IMPLANT
KIT TURNOVER KIT A (KITS) IMPLANT
MANIFOLD NEPTUNE II (INSTRUMENTS) ×2 IMPLANT
NDL SAFETY ECLIPSE 18X1.5 (NEEDLE) IMPLANT
NEEDLE HYPO 18GX1.5 SHARP (NEEDLE)
NS IRRIG 1000ML POUR BTL (IV SOLUTION) ×2 IMPLANT
PACK TOTAL KNEE CUSTOM (KITS) ×2 IMPLANT
PROTECTOR NERVE ULNAR (MISCELLANEOUS) ×2 IMPLANT
SET HNDPC FAN SPRY TIP SCT (DISPOSABLE) ×1 IMPLANT
SET PAD KNEE POSITIONER (MISCELLANEOUS) ×2 IMPLANT
SPONGE T-LAP 18X18 ~~LOC~~+RFID (SPONGE) ×6 IMPLANT
SUT MNCRL AB 4-0 PS2 18 (SUTURE) ×2 IMPLANT
SUT STRATAFIX PDS+ 0 24IN (SUTURE) ×2 IMPLANT
SUT VIC AB 1 CT1 36 (SUTURE) ×2 IMPLANT
SUT VIC AB 2-0 CT1 27 (SUTURE) ×6
SUT VIC AB 2-0 CT1 TAPERPNT 27 (SUTURE) ×3 IMPLANT
SYR 3ML LL SCALE MARK (SYRINGE) ×2 IMPLANT
TIBIAL BASE ROT PLAT SZ 3 KNEE (Knees) ×2 IMPLANT
TRAY FOLEY MTR SLVR 16FR STAT (SET/KITS/TRAYS/PACK) ×2 IMPLANT
TUBE SUCTION HIGH CAP CLEAR NV (SUCTIONS) ×2 IMPLANT
WATER STERILE IRR 1000ML POUR (IV SOLUTION) ×4 IMPLANT
WRAP KNEE MAXI GEL POST OP (GAUZE/BANDAGES/DRESSINGS) ×2 IMPLANT

## 2021-03-12 NOTE — Anesthesia Preprocedure Evaluation (Signed)
Anesthesia Evaluation  Patient identified by MRN, date of birth, ID band Patient awake    Reviewed: Allergy & Precautions, NPO status , Patient's Chart, lab work & pertinent test results  History of Anesthesia Complications (+) PONV  Airway Mallampati: II  TM Distance: >3 FB Neck ROM: Full    Dental no notable dental hx.    Pulmonary neg pulmonary ROS, former smoker,    Pulmonary exam normal breath sounds clear to auscultation       Cardiovascular hypertension, Pt. on medications Normal cardiovascular exam Rhythm:Regular Rate:Normal     Neuro/Psych negative neurological ROS  negative psych ROS   GI/Hepatic Neg liver ROS, GERD  ,  Endo/Other  negative endocrine ROS  Renal/GU negative Renal ROS  negative genitourinary   Musculoskeletal negative musculoskeletal ROS (+)   Abdominal   Peds negative pediatric ROS (+)  Hematology negative hematology ROS (+)   Anesthesia Other Findings   Reproductive/Obstetrics negative OB ROS                             Anesthesia Physical Anesthesia Plan  ASA: 2  Anesthesia Plan: Spinal   Post-op Pain Management: Regional block   Induction: Intravenous  PONV Risk Score and Plan: 3 and Dexamethasone, Propofol infusion and Treatment may vary due to age or medical condition  Airway Management Planned: Simple Face Mask  Additional Equipment:   Intra-op Plan:   Post-operative Plan:   Informed Consent: I have reviewed the patients History and Physical, chart, labs and discussed the procedure including the risks, benefits and alternatives for the proposed anesthesia with the patient or authorized representative who has indicated his/her understanding and acceptance.     Dental advisory given  Plan Discussed with: CRNA and Surgeon  Anesthesia Plan Comments:         Anesthesia Quick Evaluation

## 2021-03-12 NOTE — Transfer of Care (Signed)
Immediate Anesthesia Transfer of Care Note  Patient: [name]  Procedure(s) Performed: TOTAL KNEE ARTHROPLASTY (Left: Knee)  Patient Location: PACU  Anesthesia Type:Spinal  Level of Consciousness: drowsy  Airway & Oxygen Therapy: Patient Spontanous Breathing and Patient connected to face mask oxygen  Post-op Assessment: Report given to RN and Post -op Vital signs reviewed and stable  Post vital signs: Reviewed and stable  Last Vitals:  Vitals Value Taken Time  BP 112/59   Temp    Pulse 62 03/12/21 1453  Resp 12 03/12/21 1453  SpO2 100 % 03/12/21 1453  Vitals shown include unvalidated device data.  Last Pain:  Vitals:   03/12/21 1231  TempSrc:   PainSc: 2       Patients Stated Pain Goal: 2 (83/47/58 3074)  Complications: No notable events documented.

## 2021-03-12 NOTE — Care Plan (Signed)
Ortho Bundle Case Management Note  Patient Details  Name: [name] MRN: [id number] Date of Birth: 04/09/1945                  L TKA on 03-12-21 DCP: Home with dtr DME: RW ordered through [place] PT: Janalyn Shy x 1 week, referral sent then [facility] on 03-25-21 at 3:00 pm.   DME Arranged:  Gilford Rile rolling DME Agency:  [employer]  HH Arranged:    [place] Agency:     Additional Comments: Please contact me with any questions of if this plan should need to change.  Marianne Sofia, RN,CCM [facility]  [phone] 03/12/2021, 4:53 PM

## 2021-03-12 NOTE — Progress Notes (Signed)
Assisted Dr. Rose with left, ultrasound guided, adductor canal block. Side rails up, monitors on throughout procedure. See vital signs in flow sheet. Tolerated Procedure well.  

## 2021-03-12 NOTE — Anesthesia Procedure Notes (Signed)
Anesthesia Regional Block: Adductor canal block   Pre-Anesthetic Checklist: , timeout performed,  Correct Patient, Correct Site, Correct Laterality,  Correct Procedure, Correct Position, site marked,  Risks and benefits discussed,  Surgical consent,  Pre-op evaluation,  At surgeon's request and post-op pain management  Laterality: Left  Prep: chloraprep       Needles:  Injection technique: Single-shot  Needle Type: Echogenic Needle     Needle Length: 9cm      Additional Needles:   Procedures:,,,, ultrasound used (permanent image in chart),,    Narrative:  Start time: 03/12/2021 12:27 PM End time: 03/12/2021 12:34 PM Injection made incrementally with aspirations every 5 mL.  Performed by: Personally  Anesthesiologist: Myrtie Soman, MD  Additional Notes: Patient tolerated the procedure well without complications

## 2021-03-12 NOTE — Discharge Instructions (Signed)

## 2021-03-12 NOTE — Op Note (Signed)
NAME:  [name]                      MEDICAL RECORD NO.:  [id number]                             FACILITY:  [facility]      PHYSICIAN:  Pietro Cassis. Alvan Dame, M.D.  DATE OF BIRTH:  Aug 10, 1945      DATE OF PROCEDURE:  03/12/2021                                     OPERATIVE REPORT         PREOPERATIVE DIAGNOSIS:  Left knee osteoarthritis.      POSTOPERATIVE DIAGNOSIS:  Left knee osteoarthritis.      FINDINGS:  The patient was noted to have complete loss of cartilage and   bone-on-bone arthritis with associated osteophytes in the medial and patellofemoral compartments of   the knee with a significant synovitis and associated effusion.  The patient had failed months of conservative treatment including medications, injection therapy, activity modification.     PROCEDURE:  Left total knee replacement.      COMPONENTS USED:  DePuy Attune rotating platform posterior stabilized knee   system, a size 4N femur, 3 tibia, size 6 mm PS AOX insert, and 35 anatomic patellar   button.      SURGEON:  Pietro Cassis. Alvan Dame, M.D.      ASSISTANT:  Costella Hatcher, PA-C.      ANESTHESIA:  General and Spinal.      SPECIMENS:  None.      COMPLICATION:  None.      DRAINS:  None.  EBL: <100 cc      TOURNIQUET TIME:  32 min at 225 mmHg     The patient was stable to the recovery room.      INDICATION FOR PROCEDURE:  [name] is a 76 y.o. female patient of   mine.  The patient had been seen, evaluated, and treated for months conservatively in the   office with medication, activity modification, and injections.  The patient had   radiographic changes of bone-on-bone arthritis with endplate sclerosis and osteophytes noted.  Based on the radiographic changes and failed conservative measures, the patient   decided to proceed with definitive treatment, total knee replacement.  Risks of infection, DVT, component failure, need for revision surgery, neurovascular injury were reviewed in the office setting.  The  postop course was reviewed stressing the efforts to maximize post-operative satisfaction and function.  Consent was obtained for benefit of pain   relief.      PROCEDURE IN DETAIL:  The patient was brought to the operative theater.   Once adequate anesthesia, preoperative antibiotics, 1gm of Vancomycin, weight based dose of Gentamycin,1 gm of Tranexamic Acid, and 10 mg of Decadron administered, the patient was positioned supine with a left thigh tourniquet placed.  The left lower extremity was prepped and draped in sterile fashion.  A time-   out was performed identifying the patient, planned procedure, and the appropriate extremity.      The left lower extremity was placed in the [facility] leg holder.  The leg was   exsanguinated, tourniquet elevated to 250 mmHg.  A midline incision was   made followed by median parapatellar arthrotomy.  Following initial  exposure, attention was first directed to the patella.  Precut   measurement was noted to be 22 mm.  I resected down to 13 mm and used a   35 anatomic patellar button to restore patellar height as well as cover the cut surface.      The lug holes were drilled and a metal shim was placed to protect the   patella from retractors and saw blade during the procedure.      At this point, attention was now directed to the femur.  The femoral   canal was opened with a drill, irrigated to try to prevent fat emboli.  An   intramedullary rod was passed at 3 degrees valgus, 9 mm of bone was   resected off the distal femur.  Following this resection, the tibia was   subluxated anteriorly.  Using the extramedullary guide, 2 mm of bone was resected off   the proximal medial tibia.  We confirmed the gap would be   stable medially and laterally with a size 5 spacer block as well as confirmed that the tibial cut was perpendicular in the coronal plane, checking with an alignment rod.      Once this was done, I sized the femur to be a size 4 in the anterior-    posterior dimension, chose a narrow component based on medial and   lateral dimension.  The size 4 rotation block was then pinned in   position anterior referenced using the C-clamp to set rotation.  The   anterior, posterior, and  chamfer cuts were made without difficulty nor   notching making certain that I was along the anterior cortex to help   with flexion gap stability.      The final box cut was made off the lateral aspect of distal femur.      At this point, the tibia was sized to be a size 3.  The size 3 tray was   then pinned in position through the medial third of the tubercle,   drilled, and keel punched.  Trial reduction was now carried with a 4 femur,  3 tibia, a size 6 mm PS insert, and the 35 anatomic patella botton.  The knee was brought to full extension with good flexion stability with the patella   tracking through the trochlea without application of pressure.  Given   all these findings the trial components removed.  Final components were   opened and cement was mixed.  The knee was irrigated with normal saline solution and pulse lavage.  The synovial lining was   then injected with 30 cc of 0.25% Marcaine with epinephrine, 1 cc of Toradol and 30 cc of NS for a total of 61 cc.     Final implants were then cemented onto cleaned and dried cut surfaces of bone with the knee brought to extension with a size 6 mm PS trial insert.      Once the cement had fully cured, excess cement was removed   throughout the knee.  I confirmed that I was satisfied with the range of   motion and stability, and the final size 6 mm PS AOX insert was chosen.  It was   placed into the knee.      The tourniquet had been let down at 32 minutes.  No significant   hemostasis was required.  The extensor mechanism was then reapproximated using #1 Vicryl and #1 Stratafix sutures with the knee   in flexion.  The   remaining wound was closed with 2-0 Vicryl and running 4-0 Monocryl.   The knee was  cleaned, dried, dressed sterilely using Dermabond and   Aquacel dressing.  The patient was then   brought to recovery room in stable condition, tolerating the procedure   well.   Please note that Physician Assistant, Costella Hatcher, PA-C was present for the entirety of the case, and was utilized for pre-operative positioning, peri-operative retractor management, general facilitation of the procedure and for primary wound closure at the end of the case.              Pietro Cassis Alvan Dame, M.D.    03/12/2021 11:53 AM

## 2021-03-12 NOTE — Anesthesia Procedure Notes (Signed)
Spinal  Patient location during procedure: OR Start time: 03/12/2021 12:58 PM End time: 03/12/2021 1:02 PM Reason for block: surgical anesthesia Staffing Performed: anesthesiologist  Anesthesiologist: Myrtie Soman, MD Preanesthetic Checklist Completed: patient identified, IV checked, site marked, risks and benefits discussed, surgical consent, monitors and equipment checked, pre-op evaluation and timeout performed Spinal Block Patient position: sitting Prep: Betadine Patient monitoring: heart rate, continuous pulse ox and blood pressure Approach: midline Location: L3-4 Injection technique: single-shot Needle Needle type: Sprotte  Needle gauge: 24 G Needle length: 9 cm Assessment Sensory level: T6 Events: CSF return Additional Notes

## 2021-03-12 NOTE — Interval H&P Note (Signed)
History and Physical Interval Note:  03/12/2021 11:46 AM  [name]  has presented today for surgery, with the diagnosis of Left knee osteoarthritis.  The various methods of treatment have been discussed with the patient and family. After consideration of risks, benefits and other options for treatment, the patient has consented to  Procedure(s): TOTAL KNEE ARTHROPLASTY (Left) as a surgical intervention.  The patient's history has been reviewed, patient examined, no change in status, stable for surgery.  I have reviewed the patient's chart and labs.  Questions were answered to the patient's satisfaction.     Mauri Pole

## 2021-03-12 NOTE — Anesthesia Postprocedure Evaluation (Signed)
Anesthesia Post Note  Patient: [name]  Procedure(s) Performed: TOTAL KNEE ARTHROPLASTY (Left: Knee)     Patient location during evaluation: PACU Anesthesia Type: Spinal Level of consciousness: oriented and awake and alert Pain management: pain level controlled Vital Signs Assessment: post-procedure vital signs reviewed and stable Respiratory status: spontaneous breathing, respiratory function stable and patient connected to nasal cannula oxygen Cardiovascular status: blood pressure returned to baseline and stable Postop Assessment: no headache, no backache and no apparent nausea or vomiting Anesthetic complications: no   No notable events documented.  Last Vitals:  Vitals:   03/12/21 1615 03/12/21 1630  BP: (!) 141/68 139/66  Pulse: 63 63  Resp: 13 13  Temp:    SpO2: 100% 100%    Last Pain:  Vitals:   03/12/21 1630  TempSrc:   PainSc: 6                  Aarion Kittrell S

## 2021-03-12 NOTE — Anesthesia Procedure Notes (Signed)
Anesthesia Procedure Image    

## 2021-03-13 DIAGNOSIS — Z79899 Other long term (current) drug therapy: Secondary | ICD-10-CM | POA: Diagnosis not present

## 2021-03-13 DIAGNOSIS — I110 Hypertensive heart disease with heart failure: Secondary | ICD-10-CM | POA: Diagnosis not present

## 2021-03-13 DIAGNOSIS — I509 Heart failure, unspecified: Secondary | ICD-10-CM | POA: Diagnosis not present

## 2021-03-13 DIAGNOSIS — Z85820 Personal history of malignant melanoma of skin: Secondary | ICD-10-CM | POA: Diagnosis not present

## 2021-03-13 DIAGNOSIS — M1712 Unilateral primary osteoarthritis, left knee: Secondary | ICD-10-CM | POA: Diagnosis not present

## 2021-03-13 DIAGNOSIS — Z87891 Personal history of nicotine dependence: Secondary | ICD-10-CM | POA: Diagnosis not present

## 2021-03-13 DIAGNOSIS — Z91040 Latex allergy status: Secondary | ICD-10-CM | POA: Diagnosis not present

## 2021-03-13 LAB — CBC
HCT: 32.5 % — ABNORMAL LOW (ref 36.0–46.0)
Hemoglobin: 10.8 g/dL — ABNORMAL LOW (ref 12.0–15.0)
MCH: 29.8 pg (ref 26.0–34.0)
MCHC: 33.2 g/dL (ref 30.0–36.0)
MCV: 89.5 fL (ref 80.0–100.0)
Platelets: 208 10*3/uL (ref 150–400)
RBC: 3.63 MIL/uL — ABNORMAL LOW (ref 3.87–5.11)
RDW: 11.9 % (ref 11.5–15.5)
WBC: 9.9 10*3/uL (ref 4.0–10.5)
nRBC: 0 % (ref 0.0–0.2)

## 2021-03-13 LAB — BASIC METABOLIC PANEL
Anion gap: 7 (ref 5–15)
BUN: 10 mg/dL (ref 8–23)
CO2: 26 mmol/L (ref 22–32)
Calcium: 8.7 mg/dL — ABNORMAL LOW (ref 8.9–10.3)
Chloride: 108 mmol/L (ref 98–111)
Creatinine, Ser: 0.58 mg/dL (ref 0.44–1.00)
GFR, Estimated: >60 mL/min (ref >60)
Glucose, Bld: 149 mg/dL — ABNORMAL HIGH (ref 70–99)
Potassium: 4 mmol/L (ref 3.5–5.1)
Sodium: 141 mmol/L (ref 135–145)

## 2021-03-13 MED ORDER — HYDROMORPHONE HCL 2 MG PO TABS: 2.0000 mg | ORAL_TABLET | ORAL | 0 refills | Status: DC | PRN

## 2021-03-13 MED ORDER — ASPIRIN 81 MG PO CHEW: 81.0000 mg | CHEWABLE_TABLET | Freq: Two times a day (BID) | ORAL | 0 refills | Status: AC

## 2021-03-13 MED ORDER — METHOCARBAMOL 500 MG PO TABS: 500.0000 mg | ORAL_TABLET | Freq: Four times a day (QID) | ORAL | 0 refills | Status: DC | PRN

## 2021-03-13 MED ORDER — ONDANSETRON HCL 4 MG PO TABS: 4.0000 mg | ORAL_TABLET | Freq: Four times a day (QID) | ORAL | 0 refills | Status: DC | PRN

## 2021-03-13 MED ORDER — DOCUSATE SODIUM 100 MG PO CAPS: 100.0000 mg | ORAL_CAPSULE | Freq: Two times a day (BID) | ORAL | 0 refills | Status: DC

## 2021-03-13 MED ORDER — POLYETHYLENE GLYCOL 3350 17 G PO PACK: 17.0000 g | PACK | Freq: Every day | ORAL | 0 refills | Status: DC | PRN

## 2021-03-13 NOTE — Progress Notes (Signed)
Physical Therapy Treatment Patient Details Name: [name] MRN: [id number] DOB: Jun 03, 1945 Today's Date: 03/13/2021   History of Present Illness 76 y.o. female admitted 03/12/21 for L TKA. PMH of anxiety/depression, CHF, migraine.    PT Comments    Pt has met PT goals and is ready to DC home from a PT standpoint. Stair training completed, she demonstrates good understanding of HEP, she ambulated 73' with RW.    Recommendations for follow up therapy are one component of a multi-disciplinary discharge planning process, led by the attending physician.  Recommendations may be updated based on patient status, additional functional criteria and insurance authorization.  Follow Up Recommendations  Outpatient PT     Assistance Recommended at Discharge Intermittent Supervision/Assistance  Patient can return home with the following A Beazer help with walking and/or transfers;Assist for transportation;Assistance with cooking/housework;A [place]ry help with bathing/dressing/bathroom   Equipment Recommendations  Rolling walker (2 wheels)    Recommendations for Other Services       Precautions / Restrictions Precautions Precautions: Knee;Fall Precaution Booklet Issued: Yes (comment) Precaution Comments: reviewed no pillow under knee Restrictions Weight Bearing Restrictions: No LLE Weight Bearing: Weight bearing as tolerated     Mobility  Bed Mobility Overal bed mobility: Modified Independent                  Transfers Overall transfer level: Needs assistance Equipment used: Rolling walker (2 wheels) Transfers: Sit to/from Stand Sit to Stand: Supervision           General transfer comment: VCs hand placement    Ambulation/Gait Ambulation/Gait assistance: Supervision Gait Distance (Feet): 130 Feet Assistive device: Rolling walker (2 wheels) Gait Pattern/deviations: Step-to pattern;Decreased step length - right;Decreased step length - left Gait velocity: decr      General Gait Details: VCs sequencing initially, no loss of balance   Stairs Stairs: Yes Stairs assistance: Min guard Stair Management: One rail Left;Step to pattern;Forwards;With cane Number of Stairs: 3 General stair comments: VCs sequencing, min/guard safety   Wheelchair Mobility    Modified Rankin (Stroke Patients Only)       Balance Overall balance assessment: Modified Independent                                          Cognition Arousal/Alertness: Awake/alert Behavior During Therapy: WFL for tasks assessed/performed Overall Cognitive Status: Within Functional Limits for tasks assessed                                          Exercises Total Joint Exercises Ankle Circles/Pumps: AROM;Both;10 reps;Supine Quad Sets: AROM;Left;5 reps;Supine Short Arc Quad: AROM;Left;5 reps;Supine Heel Slides: AAROM;Left;5 reps;Supine Hip ABduction/ADduction: AROM;Left;5 reps;Supine Straight Leg Raises: AROM;Left;5 reps;Supine Long Arc Quad: AROM;Left;5 reps;Seated Knee Flexion: AAROM;Left;10 reps;Seated Goniometric ROM: 0-80* AAROM L knee    General Comments        Pertinent Vitals/Pain Pain Assessment: 0-10 Pain Score: 3  Pain Location: L knee Pain Descriptors / Indicators: Sore Pain Intervention(s): Limited activity within patient's tolerance;Monitored during session;Premedicated before session;Ice applied    Home Living Family/patient expects to be discharged to:: Private residence Living Arrangements: Alone Available Help at Discharge: Family;Available 24 hours/day Type of Home: House Home Access: Stairs to enter Entrance Stairs-Rails: Left Entrance Stairs-Number of Steps: 3 Alternate Level Stairs-Number of Steps:  flight Home Layout: Two level;Able to live on main level with bedroom/bathroom Home Equipment: Rolling Walker (2 wheels);Cane - single point;Toilet riser;BSC/3in1      Prior Function            PT Goals (current  goals can now be found in the care plan section) Acute Rehab PT Goals Patient Stated Goal: walk without pain, do housework PT Goal Formulation: All assessment and education complete, DC therapy    Frequency           PT Plan      Co-evaluation              AM-PAC PT "6 Clicks" Mobility   Outcome Measure  Help needed turning from your back to your side while in a flat bed without using bedrails?: None Help needed moving from lying on your back to sitting on the side of a flat bed without using bedrails?: None Help needed moving to and from a bed to a chair (including a wheelchair)?: A Nawabi Help needed standing up from a chair using your arms (e.g., wheelchair or bedside chair)?: None Help needed to walk in hospital room?: A Pedraza Help needed climbing 3-5 steps with a railing? : A Mccleary 6 Click Score: 21    End of Session Equipment Utilized During Treatment: Gait belt Activity Tolerance: Patient tolerated treatment well Patient left: in chair;with call bell/phone within reach;with family/visitor present Nurse Communication: Mobility status       Time: 0122-2411 PT Time Calculation (min) (ACUTE ONLY): 57 min  Charges:  $Gait Training: 23-37 mins $Therapeutic Exercise: 23-37 mins                     Blondell Reveal Kistler PT 03/13/2021  Acute Rehabilitation Services Pager [phone] Office [phone]

## 2021-03-13 NOTE — TOC Transition Note (Signed)
Transition of Care [facility]) - CM/SW Discharge Note  Patient Details  Name: [name] MRN: [id number] Date of Birth: [birth year]  [facility] CM/SW Contact:  Sherie Don, LCSW Phone Number: 03/13/2021, 10:54 AM  Clinical Narrative: Patient is expected discharge home after working with PT. CSW met with patient and her daughter to confirm discharge plans and needs. Patient will discharge with her daughter and do OPPT at [place] and then transition to [place] at [facility] in [place] on 03/25/21. Patient will need a rolling walker. [employer] to deliver walker to room. TOC signing off.  Final next level of care: OP Rehab Barriers to Discharge: No Barriers Identified  Patient Goals and CMS Choice Patient states their goals for this hospitalization and ongoing recovery are:: Discharge home/daughter's home with OPPT CMS Medicare.gov Compare Post Acute Care list provided to:: Patient Choice offered to / list presented to : Patient  Discharge Plan and Services        DME Arranged: Walker rolling DME Agency: [employer] Representative spoke with at DME Agency: Prearranged in orthopedist's office  Readmission Risk Interventions No flowsheet data found.

## 2021-03-13 NOTE — Progress Notes (Signed)
Subjective: 1 Day Post-Op Procedure(s) (LRB): TOTAL KNEE ARTHROPLASTY (Left) Patient reports pain as mild.   Patient seen in rounds for Dr. Alvan Dame. Patient is well, and has had no acute complaints or problems. No acute events overnight. Foley catheter removed. Patient did not get up with PT yet.  We will start therapy today.   Objective: Vital signs in last 24 hours: Temp:  [97.6 F (36.4 C)-98.4 F (36.9 C)] 98.4 F (36.9 C) (01/04 1004) Pulse Rate:  [59-71] 66 (01/04 1004) Resp:  [12-26] 18 (01/04 1004) BP: (104-141)/(48-71) 121/56 (01/04 1004) SpO2:  [94 %-100 %] 98 % (01/04 1004)  Intake/Output from previous day:  Intake/Output Summary (Last 24 hours) at 03/13/2021 1246 Last data filed at 03/13/2021 1145 Gross per 24 hour  Intake 4919.81 ml  Output 3850 ml  Net 1069.81 ml     Intake/Output this shift: Total I/O In: -  Out: 425 [Urine:425]  Labs: Recent Labs    03/13/21 0329  HGB 10.8*   Recent Labs    03/13/21 0329  WBC 9.9  RBC 3.63*  HCT 32.5*  PLT 208   Recent Labs    03/13/21 0329  NA 141  K 4.0  CL 108  CO2 26  BUN 10  CREATININE 0.58  GLUCOSE 149*  CALCIUM 8.7*   No results for input(s): LABPT, INR in the last 72 hours.  Exam: General - Patient is Alert and Oriented Extremity - Neurologically intact Sensation intact distally Intact pulses distally Dorsiflexion/Plantar flexion intact Dressing - dressing C/D/I Motor Function - intact, moving foot and toes well on exam.   Past Medical History:  Diagnosis Date   Anxiety    when husband passed   Arthritis    generalized   Cardiomyopathy    nonischemic   Cataract    bilateral sx   CHF (congestive heart failure) (HCC)    Chronic back pain    Complication of anesthesia    Erosive esophagitis    LA Classification Grade B   Essential hypertension    on meds   GERD (gastroesophageal reflux disease)    Hiatal hernia    Hyperlipidemia    on meds   Insomnia    MDD (major  depressive disorder)    situational - when husband passed   Melanoma ([place])    basil cell, pt reports no melanoma   Migraine    Osteopenia    PONV (postoperative nausea and vomiting)    Rosacea    Sessile colonic polyp 10/2011   adenoma   Squamous carcinoma    scalp    Assessment/Plan: 1 Day Post-Op Procedure(s) (LRB): TOTAL KNEE ARTHROPLASTY (Left) Principal Problem:   S/P total knee arthroplasty, left  Estimated body mass index is 23.43 kg/m as calculated from the following:   Height as of this encounter: 5\' 2"  (1.575 m).   Weight as of this encounter: 58.1 kg. Advance diet Up with therapy D/C IV fluids   Patient's anticipated LOS is less than 2 midnights, meeting these requirements: - Younger than 11 - Lives within 1 hour of care - Has a competent adult at home to recover with post-op recover - NO history of  - Chronic pain requiring opiods  - Diabetes  - Coronary Artery Disease  - Heart failure  - Heart attack  - Stroke  - DVT/VTE  - Cardiac arrhythmia  - Respiratory Failure/COPD  - Renal failure  - Anemia  - Advanced Liver disease     DVT Prophylaxis -  Aspirin Weight bearing as tolerated.  Hgb stable at 10.8 this AM.  Plan is to go Home after hospital stay. Plan for discharge today following 1-2 sessions of PT as long as they are meeting their goals. Patient is scheduled for OPPT. Follow up in the office in 2 weeks.   Griffith Citron, PA-C Orthopedic Surgery [phone] 03/13/2021, 12:46 PM

## 2021-03-13 NOTE — Plan of Care (Signed)
Plan of care discussed.   

## 2021-03-18 ENCOUNTER — Encounter (HOSPITAL_COMMUNITY): Payer: Self-pay | Admitting: Orthopedic Surgery

## 2021-03-18 DIAGNOSIS — Z96652 Presence of left artificial knee joint: Secondary | ICD-10-CM | POA: Diagnosis not present

## 2021-03-20 DIAGNOSIS — M25562 Pain in left knee: Secondary | ICD-10-CM | POA: Diagnosis not present

## 2021-03-20 DIAGNOSIS — Z96652 Presence of left artificial knee joint: Secondary | ICD-10-CM | POA: Diagnosis not present

## 2021-03-20 DIAGNOSIS — R29898 Other symptoms and signs involving the musculoskeletal system: Secondary | ICD-10-CM | POA: Diagnosis not present

## 2021-03-21 NOTE — Discharge Summary (Signed)
Patient ID: BREN BORYS MRN: [id number] DOB/AGE: Jun 26, 1945 76 y.o.  Admit date: 03/12/2021 Discharge date: 03/13/2021  Admission Diagnoses:  Left knee osteoarthritis  Discharge Diagnoses:  Principal Problem:   S/P total knee arthroplasty, left   Past Medical History:  Diagnosis Date   Anxiety    when husband passed   Arthritis    generalized   Cardiomyopathy    nonischemic   Cataract    bilateral sx   CHF (congestive heart failure) (HCC)    Chronic back pain    Complication of anesthesia    Erosive esophagitis    LA Classification Grade B   Essential hypertension    on meds   GERD (gastroesophageal reflux disease)    Hiatal hernia    Hyperlipidemia    on meds   Insomnia    MDD (major depressive disorder)    situational - when husband passed   Melanoma ([place])    basil cell, pt reports no melanoma   Migraine    Osteopenia    PONV (postoperative nausea and vomiting)    Rosacea    Sessile colonic polyp 10/2011   adenoma   Squamous carcinoma    scalp    Surgeries: Procedure(s): TOTAL KNEE ARTHROPLASTY on 03/12/2021   Consultants:   Discharged Condition: Improved  Hospital Course: MILES BORKOWSKI is an 76 y.o. female who was admitted 03/12/2021 for operative treatment ofS/P total knee arthroplasty, left. Patient has severe unremitting pain that affects sleep, daily activities, and work/hobbies. After pre-op clearance the patient was taken to the operating room on 03/12/2021 and underwent  Procedure(s): TOTAL KNEE ARTHROPLASTY.    Patient was given perioperative antibiotics:  Anti-infectives (From admission, onward)    Start     Dose/Rate Route Frequency Ordered Stop   03/13/21 0030  vancomycin (VANCOCIN) IVPB 1000 mg/200 mL premix        1,000 mg 200 mL/hr over 60 Minutes Intravenous Every 12 hours 03/12/21 1743 03/13/21 0152   03/12/21 1045  vancomycin (VANCOCIN) IVPB 1000 mg/200 mL premix        1,000 mg 200 mL/hr over 60 Minutes Intravenous On call to O.R.  03/12/21 1033 03/12/21 1336        Patient was given sequential compression devices, early ambulation, and chemoprophylaxis to prevent DVT. Patient worked with PT and was meeting their goals regarding safe ambulation and transfers.  Patient benefited maximally from hospital stay and there were no complications.    Recent vital signs: No data found.   Recent laboratory studies: No results for input(s): WBC, HGB, HCT, PLT, NA, K, CL, CO2, BUN, CREATININE, GLUCOSE, INR, CALCIUM in the last 72 hours.  Invalid input(s): PT, 2   Discharge Medications:   Allergies as of 03/13/2021       Reactions   Penicillin G Swelling, Rash   Penicillins Anaphylaxis, Hives   Has patient had a PCN reaction causing immediate rash, facial/tongue/throat swelling, SOB or lightheadedness with hypotension: {Yes Has patient had a PCN reaction causing severe rash involving mucus membranes or skin necrosis: {no Has patient had a PCN reaction that required hospitalization {yes Has patient had a PCN reaction occurring within the last 10 years: {no If all of the above answers are "NO", then may proceed with Cephalosporin use.   Sulfa Antibiotics Hives   Sulfasalazine Hives   Cefaclor Itching, Rash   Cefuroxime Axetil Itching, Rash   Clindamycin Itching, Rash   Clindamycin/lincomycin Itching, Rash   Lansoprazole Other (See Comments)   REACTION:  headaches   Latex Itching, Rash   Lincomycin Itching, Rash   Lincomycin Hcl Itching, Rash   Sulfamethoxazole-trimethoprim Rash        Medication List     STOP taking these medications    ibuprofen 200 MG tablet Commonly known as: ADVIL   promethazine 12.5 MG tablet Commonly known as: PHENERGAN   traMADol 50 MG tablet Commonly known as: ULTRAM       TAKE these medications    acetaminophen 500 MG tablet Commonly known as: TYLENOL Take 1,000 mg by mouth every 6 (six) hours as needed for moderate pain or headache.   amLODipine 2.5 MG tablet Commonly  known as: NORVASC TAKE ONE TABLET EVERY DAY   aspirin 81 MG chewable tablet Chew 1 tablet (81 mg total) by mouth 2 (two) times daily for 28 days. For prevention of blood clot   carvedilol 12.5 MG tablet Commonly known as: COREG Take 1 tablet (12.5 mg total) by mouth 2 (two) times daily with a meal.   docusate sodium 100 MG capsule Commonly known as: COLACE Take 1 capsule (100 mg total) by mouth 2 (two) times daily.   enalapril 10 MG tablet Commonly known as: VASOTEC TAKE ONE TABLET EVERY DAY   escitalopram 20 MG tablet Commonly known as: LEXAPRO Take 1 tablet (20 mg total) by mouth daily. For anxiety. Office visit required for further refills. What changed: when to take this   famotidine 40 MG tablet Commonly known as: PEPCID Take 1 tablet (40 mg total) by mouth at bedtime.   HYDROmorphone 2 MG tablet Commonly known as: DILAUDID Take 1-2 tablets (2-4 mg total) by mouth every 4 (four) hours as needed for severe pain (pain score 7-10).   methocarbamol 500 MG tablet Commonly known as: ROBAXIN Take 1 tablet (500 mg total) by mouth every 6 (six) hours as needed for muscle spasms.   metroNIDAZOLE 0.75 % cream Commonly known as: METROCREAM Apply topically daily as needed (rash).   multivitamin capsule Take 1 capsule by mouth daily.   nitroGLYCERIN 0.4 MG SL tablet Commonly known as: NITROSTAT Place 1 tablet (0.4 mg total) under the tongue every 5 (five) minutes as needed for chest pain.   ondansetron 4 MG tablet Commonly known as: ZOFRAN Take 1 tablet (4 mg total) by mouth every 6 (six) hours as needed for nausea.   pantoprazole 40 MG tablet Commonly known as: PROTONIX TAKE 1 TABLET BY MOUTH TWICE DAILY AS NEEDED FOR HEARTBURN What changed: See the new instructions.   polyethylene glycol 17 g packet Commonly known as: MIRALAX / GLYCOLAX Take 17 g by mouth daily as needed for mild constipation.   REFRESH OP Place 1 drop into both eyes daily as needed (dry eyes).  Eye drops w Omega three oil ingredients   rosuvastatin 20 MG tablet Commonly known as: CRESTOR TAKE ONE TABLET EVERY DAY FOR CHOLESTEROL What changed: See the new instructions.   Vitamin D3 50 MCG (2000 UT) capsule Take 2,000 Units by mouth daily.   zolpidem 10 MG tablet Commonly known as: AMBIEN Take 1 tablet (10 mg total) by mouth at bedtime as needed for sleep. Office visit required for further refills.               Discharge Care Instructions  (From admission, onward)           Start     Ordered   03/13/21 0000  Change dressing       Comments: Maintain surgical dressing until follow  up in the clinic. If the edges start to pull up, may reinforce with tape. If the dressing is no longer working, may remove and cover with gauze and tape, but must keep the area dry and clean.  Call with any questions or concerns.   03/13/21 1250            Diagnostic Studies: No results found.  Disposition: Discharge disposition: 01-Home or Self Care       Discharge Instructions     Call MD / Call 911   Complete by: As directed    If you experience chest pain or shortness of breath, CALL 911 and be transported to the hospital emergency room.  If you develope a fever above 101 F, pus (white drainage) or increased drainage or redness at the wound, or calf pain, call your surgeon's office.   Change dressing   Complete by: As directed    Maintain surgical dressing until follow up in the clinic. If the edges start to pull up, may reinforce with tape. If the dressing is no longer working, may remove and cover with gauze and tape, but must keep the area dry and clean.  Call with any questions or concerns.   Constipation Prevention   Complete by: As directed    Drink plenty of fluids.  Prune juice may be helpful.  You may use a stool softener, such as Colace (over the counter) 100 mg twice a day.  Use MiraLax (over the counter) for constipation as needed.   Diet - low sodium heart  healthy   Complete by: As directed    Increase activity slowly as tolerated   Complete by: As directed    Weight bearing as tolerated with assist device (walker, cane, etc) as directed, use it as long as suggested by your surgeon or therapist, typically at least 4-6 weeks.   Post-operative opioid taper instructions:   Complete by: As directed    POST-OPERATIVE OPIOID TAPER INSTRUCTIONS: It is important to wean off of your opioid medication as soon as possible. If you do not need pain medication after your surgery it is ok to stop day one. Opioids include: Codeine, Hydrocodone(Norco, Vicodin), Oxycodone(Percocet, oxycontin) and hydromorphone amongst others.  Long term and even short term use of opiods can cause: Increased pain response Dependence Constipation Depression Respiratory depression And more.  Withdrawal symptoms can include Flu like symptoms Nausea, vomiting And more Techniques to manage these symptoms Hydrate well Eat regular healthy meals Stay active Use relaxation techniques(deep breathing, meditating, yoga) Do Not substitute Alcohol to help with tapering If you have been on opioids for less than two weeks and do not have pain than it is ok to stop all together.  Plan to wean off of opioids This plan should start within one week post op of your joint replacement. Maintain the same interval or time between taking each dose and first decrease the dose.  Cut the total daily intake of opioids by one tablet each day Next start to increase the time between doses. The last dose that should be eliminated is the evening dose.      TED hose   Complete by: As directed    Use stockings (TED hose) for 2 weeks on both leg(s).  You may remove them at night for sleeping.        Follow-up Information     Paralee Cancel, MD. Go on 03/27/2021.   Specialty: Orthopedic Surgery Why: You are scheduled for first post op appointment  on Wednesday January 18th at 2:30pm. Contact  information: [address] [place] [address] [phone]                  Signed: Irving Copas 03/21/2021, 2:15 PM

## 2021-03-22 DIAGNOSIS — M25562 Pain in left knee: Secondary | ICD-10-CM | POA: Diagnosis not present

## 2021-03-22 DIAGNOSIS — R29898 Other symptoms and signs involving the musculoskeletal system: Secondary | ICD-10-CM | POA: Diagnosis not present

## 2021-03-22 DIAGNOSIS — Z96652 Presence of left artificial knee joint: Secondary | ICD-10-CM | POA: Diagnosis not present

## 2021-03-25 DIAGNOSIS — M25662 Stiffness of left knee, not elsewhere classified: Secondary | ICD-10-CM | POA: Diagnosis not present

## 2021-03-25 DIAGNOSIS — M25562 Pain in left knee: Secondary | ICD-10-CM | POA: Diagnosis not present

## 2021-03-26 ENCOUNTER — Encounter: Payer: Medicare HMO | Admitting: Primary Care

## 2021-03-29 DIAGNOSIS — M25662 Stiffness of left knee, not elsewhere classified: Secondary | ICD-10-CM | POA: Diagnosis not present

## 2021-03-29 DIAGNOSIS — M25562 Pain in left knee: Secondary | ICD-10-CM | POA: Diagnosis not present

## 2021-04-01 ENCOUNTER — Other Ambulatory Visit: Payer: Self-pay | Admitting: Primary Care

## 2021-04-01 DIAGNOSIS — M25662 Stiffness of left knee, not elsewhere classified: Secondary | ICD-10-CM | POA: Diagnosis not present

## 2021-04-01 DIAGNOSIS — F331 Major depressive disorder, recurrent, moderate: Secondary | ICD-10-CM

## 2021-04-01 DIAGNOSIS — M25562 Pain in left knee: Secondary | ICD-10-CM | POA: Diagnosis not present

## 2021-04-01 NOTE — Telephone Encounter (Signed)
Patient called office she has knee replacement and is not able to drive to [place] at this time. I have made follow up for 2/22 she will be out of Lexapro in 3 days.

## 2021-04-01 NOTE — Telephone Encounter (Signed)
Noted. Refill(s) sent to pharmacy.  

## 2021-04-03 DIAGNOSIS — M25662 Stiffness of left knee, not elsewhere classified: Secondary | ICD-10-CM | POA: Diagnosis not present

## 2021-04-03 DIAGNOSIS — M25562 Pain in left knee: Secondary | ICD-10-CM | POA: Diagnosis not present

## 2021-04-08 ENCOUNTER — Other Ambulatory Visit: Payer: Self-pay | Admitting: Gastroenterology

## 2021-04-08 DIAGNOSIS — K2100 Gastro-esophageal reflux disease with esophagitis, without bleeding: Secondary | ICD-10-CM

## 2021-04-09 DIAGNOSIS — M25662 Stiffness of left knee, not elsewhere classified: Secondary | ICD-10-CM | POA: Diagnosis not present

## 2021-04-09 DIAGNOSIS — M25562 Pain in left knee: Secondary | ICD-10-CM | POA: Diagnosis not present

## 2021-04-15 DIAGNOSIS — M25562 Pain in left knee: Secondary | ICD-10-CM | POA: Diagnosis not present

## 2021-04-15 DIAGNOSIS — M25662 Stiffness of left knee, not elsewhere classified: Secondary | ICD-10-CM | POA: Diagnosis not present

## 2021-04-17 ENCOUNTER — Other Ambulatory Visit: Payer: Self-pay | Admitting: Internal Medicine

## 2021-04-17 DIAGNOSIS — M25662 Stiffness of left knee, not elsewhere classified: Secondary | ICD-10-CM | POA: Diagnosis not present

## 2021-04-17 DIAGNOSIS — E7800 Pure hypercholesterolemia, unspecified: Secondary | ICD-10-CM

## 2021-04-17 DIAGNOSIS — M25562 Pain in left knee: Secondary | ICD-10-CM | POA: Diagnosis not present

## 2021-04-24 DIAGNOSIS — M25562 Pain in left knee: Secondary | ICD-10-CM | POA: Diagnosis not present

## 2021-04-24 DIAGNOSIS — M25662 Stiffness of left knee, not elsewhere classified: Secondary | ICD-10-CM | POA: Diagnosis not present

## 2021-04-29 DIAGNOSIS — H5213 Myopia, bilateral: Secondary | ICD-10-CM | POA: Diagnosis not present

## 2021-04-29 DIAGNOSIS — M25562 Pain in left knee: Secondary | ICD-10-CM | POA: Diagnosis not present

## 2021-04-29 DIAGNOSIS — M25662 Stiffness of left knee, not elsewhere classified: Secondary | ICD-10-CM | POA: Diagnosis not present

## 2021-05-01 ENCOUNTER — Other Ambulatory Visit: Payer: Self-pay

## 2021-05-01 ENCOUNTER — Encounter: Payer: Self-pay | Admitting: Primary Care

## 2021-05-01 ENCOUNTER — Other Ambulatory Visit: Payer: Self-pay | Admitting: Internal Medicine

## 2021-05-01 ENCOUNTER — Ambulatory Visit (INDEPENDENT_AMBULATORY_CARE_PROVIDER_SITE_OTHER): Payer: Medicare HMO | Admitting: Primary Care

## 2021-05-01 VITALS — BP 124/72 | HR 78 | Temp 98.6°F | Ht 62.0 in | Wt 127.0 lb

## 2021-05-01 DIAGNOSIS — F419 Anxiety disorder, unspecified: Secondary | ICD-10-CM

## 2021-05-01 DIAGNOSIS — K2100 Gastro-esophageal reflux disease with esophagitis, without bleeding: Secondary | ICD-10-CM

## 2021-05-01 DIAGNOSIS — Z96652 Presence of left artificial knee joint: Secondary | ICD-10-CM

## 2021-05-01 DIAGNOSIS — E7800 Pure hypercholesterolemia, unspecified: Secondary | ICD-10-CM

## 2021-05-01 DIAGNOSIS — I429 Cardiomyopathy, unspecified: Secondary | ICD-10-CM

## 2021-05-01 DIAGNOSIS — R739 Hyperglycemia, unspecified: Secondary | ICD-10-CM | POA: Diagnosis not present

## 2021-05-01 DIAGNOSIS — I10 Essential (primary) hypertension: Secondary | ICD-10-CM

## 2021-05-01 DIAGNOSIS — R8290 Unspecified abnormal findings in urine: Secondary | ICD-10-CM

## 2021-05-01 DIAGNOSIS — E2839 Other primary ovarian failure: Secondary | ICD-10-CM

## 2021-05-01 DIAGNOSIS — G8929 Other chronic pain: Secondary | ICD-10-CM

## 2021-05-01 DIAGNOSIS — Z0000 Encounter for general adult medical examination without abnormal findings: Secondary | ICD-10-CM

## 2021-05-01 DIAGNOSIS — F331 Major depressive disorder, recurrent, moderate: Secondary | ICD-10-CM

## 2021-05-01 DIAGNOSIS — M545 Low back pain, unspecified: Secondary | ICD-10-CM | POA: Diagnosis not present

## 2021-05-01 DIAGNOSIS — G4700 Insomnia, unspecified: Secondary | ICD-10-CM | POA: Diagnosis not present

## 2021-05-01 DIAGNOSIS — R519 Headache, unspecified: Secondary | ICD-10-CM

## 2021-05-01 DIAGNOSIS — F32A Depression, unspecified: Secondary | ICD-10-CM

## 2021-05-01 DIAGNOSIS — M25562 Pain in left knee: Secondary | ICD-10-CM | POA: Diagnosis not present

## 2021-05-01 DIAGNOSIS — M25662 Stiffness of left knee, not elsewhere classified: Secondary | ICD-10-CM | POA: Diagnosis not present

## 2021-05-01 LAB — LIPID PANEL
Cholesterol: 129 mg/dL (ref 0–200)
HDL: 48.5 mg/dL (ref >39.00)
NonHDL: 80.95
Total CHOL/HDL Ratio: 3
Triglycerides: 203 mg/dL — ABNORMAL HIGH (ref 0.0–149.0)
VLDL: 40.6 mg/dL — ABNORMAL HIGH (ref 0.0–40.0)

## 2021-05-01 LAB — LDL CHOLESTEROL, DIRECT: Direct LDL: 43 mg/dL

## 2021-05-01 LAB — POC URINALSYSI DIPSTICK (AUTOMATED)
Bilirubin, UA: NEGATIVE
Glucose, UA: NEGATIVE
Ketones, UA: NEGATIVE
Nitrite, UA: NEGATIVE
Protein, UA: POSITIVE — AB
Spec Grav, UA: 1.015 (ref 1.010–1.025)
Urobilinogen, UA: 0.2 E.U./dL
pH, UA: 6.5 (ref 5.0–8.0)

## 2021-05-01 LAB — HEMOGLOBIN A1C: Hgb A1c MFr Bld: 5.6 % (ref 4.6–6.5)

## 2021-05-01 MED ORDER — ESCITALOPRAM OXALATE 20 MG PO TABS: ORAL_TABLET | ORAL | 3 refills | Status: AC

## 2021-05-01 NOTE — Assessment & Plan Note (Signed)
Stable.  No concerns today. Continue to monitor.  

## 2021-05-01 NOTE — Assessment & Plan Note (Signed)
UA today with 3+ leuks, trace blood. Culture sent and pending.  Given recent surgery with antibiotics, will await culture prior to treating.  She feels very well and has no other symptoms.

## 2021-05-01 NOTE — Assessment & Plan Note (Signed)
Noted on prior labs.  Checking A1C today.

## 2021-05-01 NOTE — Assessment & Plan Note (Signed)
Controlled.  Continue Lexapro 20 mg daily.

## 2021-05-01 NOTE — Assessment & Plan Note (Signed)
Immunizations up-to-date. Mammogram up-to-date. Bone density scan due, orders placed. Colonoscopy up-to-date, no further imaging needed given age.  Encouraged healthy diet and regular exercise.  Exam today stable, labs pending. Labs also reviewed from January 23

## 2021-05-01 NOTE — Patient Instructions (Signed)
Stop by the lab prior to leaving today. I will notify you of your results once received.  ° °[facility] to [facility]. ° °It was a pleasure to see you today! ° °Preventive Care 65 Years and Older, Female °Preventive care refers to lifestyle choices and visits with your health care provider that can promote health and wellness. Preventive care visits are also called wellness exams. °What can I expect for my preventive care visit? °Counseling °Your health care provider may ask you questions about your: °Medical history, including: °Past medical problems. °Family medical history. °Pregnancy and menstrual history. °History of falls. °Current health, including: °Memory and ability to understand (cognition). °Emotional well-being. °Home life and relationship well-being. °Sexual activity and sexual health. °Lifestyle, including: °Alcohol, nicotine or tobacco, and drug use. °Access to firearms. °Diet, exercise, and sleep habits. °Work and work environment. °Sunscreen use. °Safety issues such as seatbelt and bike helmet use. °Physical exam °Your health care provider will check your: °Height and weight. These may be used to calculate your BMI (body mass index). BMI is a measurement that tells if you are at a healthy weight. °Waist circumference. This measures the distance around your waistline. This measurement also tells if you are at a healthy weight and may help predict your risk of certain diseases, such as type 2 diabetes and high blood pressure. °Heart rate and blood pressure. °Body temperature. °Skin for abnormal spots. °What immunizations do I need? °Vaccines are usually given at various ages, according to a schedule. Your health care provider will recommend vaccines for you based on your age, medical history, and lifestyle or other factors, such as travel or where you work. °What tests do I need? °Screening °Your health care provider may recommend screening tests for certain  conditions. This may include: °Lipid and cholesterol levels. °Hepatitis C test. °Hepatitis B test. °HIV (human immunodeficiency virus) test. °STI (sexually transmitted infection) testing, if you are at risk. °Lung cancer screening. °Colorectal cancer screening. °Diabetes screening. This is done by checking your blood sugar (glucose) after you have not eaten for a while (fasting). °Mammogram. Talk with your health care provider about how often you should have regular mammograms. °BRCA-related cancer screening. This may be done if you have a family history of breast, ovarian, tubal, or peritoneal cancers. °Bone density scan. This is done to screen for osteoporosis. °Talk with your health care provider about your test results, treatment options, and if necessary, the need for more tests. °Follow these instructions at home: °Eating and drinking ° °Eat a diet that includes fresh fruits and vegetables, whole grains, lean protein, and low-fat dairy products. Limit your intake of foods with high amounts of sugar, saturated fats, and salt. °Take vitamin and mineral supplements as recommended by your health care provider. °Do not drink alcohol if your health care provider tells you not to drink. °If you drink alcohol: °Limit how much you have to 0-1 drink a day. °Know how much alcohol is in your drink. In the [place] one drink equals one 12 oz bottle of beer (355 mL), one 5 oz glass of wine (148 mL), or one 1½ oz glass of hard liquor (44 mL). °Lifestyle °Brush your teeth every morning and night with fluoride toothpaste. Floss one time each day. °Exercise for at least 30 minutes 5 or more days each week. °Do not use any products that contain nicotine or tobacco. These products include cigarettes, chewing tobacco, and vaping devices, such as e-cigarettes. If you need   help quitting, ask your health care provider. °Do not use drugs. °If you are sexually active, practice safe sex. Use a condom or other form of protection in order to  prevent STIs. °Take aspirin only as told by your health care provider. Make sure that you understand how much to take and what form to take. Work with your health care provider to find out whether it is safe and beneficial for you to take aspirin daily. °Ask your health care provider if you need to take a cholesterol-lowering medicine (statin). °Find healthy ways to manage stress, such as: °Meditation, yoga, or listening to music. °Journaling. °Talking to a trusted person. °Spending time with friends and family. °Minimize exposure to UV radiation to reduce your risk of skin cancer. °Safety °Always wear your seat belt while driving or riding in a vehicle. °Do not drive: °If you have been drinking alcohol. Do not ride with someone who has been drinking. °When you are tired or distracted. °While texting. °If you have been using any mind-altering substances or drugs. °Wear a helmet and other protective equipment during sports activities. °If you have firearms in your house, make sure you follow all gun safety procedures. °What's next? °Visit your health care provider once a year for an annual wellness visit. °Ask your health care provider how often you should have your eyes and teeth checked. °Stay up to date on all vaccines. °This information is not intended to replace advice given to you by your health care provider. Make sure you discuss any questions you have with your health care provider. °Document Revised: 08/22/2020 Document Reviewed: 08/22/2020 °Elsevier Patient Education © 2022 [employer] °  ° °

## 2021-05-01 NOTE — Assessment & Plan Note (Addendum)
Continued symptoms, not pleased with her outcome.  Following with orthopedics and has an appointment soon.  Continue Meloxicam 15 mg daily.

## 2021-05-01 NOTE — Assessment & Plan Note (Signed)
Controlled.  Continue Ambien 10 mg nightly PRN.

## 2021-05-01 NOTE — Assessment & Plan Note (Signed)
Repeat lipid panel pending. ? ?Continue rosuvastatin 20 mg daily. ?

## 2021-05-01 NOTE — Assessment & Plan Note (Addendum)
Following with cardiology.  Continue blood pressure control with amlodipine 2.5 mg, enalapril 10 mg daily, carvedilol 12.5 mg daily.  Office notes reviewed from May 2022

## 2021-05-01 NOTE — Progress Notes (Signed)
Subjective:    Patient ID: [name], female    DOB: 1945/06/10, 76 y.o.   MRN: [id number]  HPI  [name] is a very pleasant 76 y.o. female who presents today for complete physical and follow up of chronic conditions.  She would also like to discuss urinary symptoms. She's noticed cloudy urine for the last 1 week. She denies dysuria, urgency frequency, hematuria, vaginal symptoms.   She is status post total left knee replacement since March 12, 2021 per Dr. Alvan Dame.  She continues to struggle with left knee pain, swelling, stiffness.  She is compliant to physical therapy.  She has an upcoming appointment scheduled with her orthopedist.  Immunizations: -Influenza: Completed the season -Covid-19: 3 vaccines -Shingles: Completed Shingrix -Pneumonia: Prevnar 13 in 2017, pneumococcal unspecified in 2011  Diet: [place].  Exercise: No regular exercise. She is s/p left knee surgery x 7 weeks.   Eye exam: Completes annually  Dental exam: Completes semi-annually   Mammogram: Completed in August 2022 Dexa: Completed in 2019 Colonoscopy: Completed in August 2022, no additional imaging needed.  BP Readings from Last 3 Encounters:  05/01/21 124/72  03/13/21 (!) 121/56  02/27/21 136/71      Review of Systems  Constitutional:  Negative for unexpected weight change.  HENT:  Negative for rhinorrhea.   Eyes:  Negative for visual disturbance.  Respiratory:  Negative for cough and shortness of breath.   Cardiovascular:  Negative for chest pain.  Gastrointestinal:  Negative for constipation and diarrhea.  Genitourinary:  Negative for difficulty urinating, dysuria, frequency, urgency and vaginal discharge.       Cloudy urine   Musculoskeletal:  Positive for arthralgias. Negative for myalgias.  Skin:  Negative for rash.  Allergic/Immunologic: Negative for environmental allergies.  Neurological:  Negative for dizziness, numbness and headaches.  Psychiatric/Behavioral:  The  patient is not nervous/anxious.         Past Medical History:  Diagnosis Date   Anxiety    when husband passed   Arthritis    generalized   Cardiomyopathy    nonischemic   Cataract    bilateral sx   CHF (congestive heart failure) (HCC)    Chronic back pain    Complication of anesthesia    Erosive esophagitis    LA Classification Grade B   Essential hypertension    on meds   GERD (gastroesophageal reflux disease)    Hiatal hernia    Hyperlipidemia    on meds   Insomnia    MDD (major depressive disorder)    situational - when husband passed   Melanoma ([place])    basil cell, pt reports no melanoma   Migraine    Osteopenia    PONV (postoperative nausea and vomiting)    Rosacea    Sessile colonic polyp 10/2011   adenoma   Squamous carcinoma    scalp    Social History   Socioeconomic History   Marital status: Widowed    Spouse name: Not on file   Number of children: 3   Years of education: 29   Highest education level: Not on file  Occupational History   Not on file  Tobacco Use   Smoking status: Former    Types: Cigarettes    Quit date: 06/03/1981    Years since quitting: 39.9   Smokeless tobacco: Never  Vaping Use   Vaping Use: Never used  Substance and Sexual Activity   Alcohol use: Yes    Alcohol/week: 4.0  standard drinks    Types: 4 Glasses of wine per week    Comment: Wine per night   Drug use: No   Sexual activity: Not Currently  Other Topics Concern   Not on file  Social History Narrative   Lives alone in two-story home.     Her husband passed away unexpectedly.     They have three children- one is a pediatrician.   Desires CPR.   Caffeine use: Decaf coffee   Right handed   Social Determinants of Health   Financial Resource Strain: Not on file  Food Insecurity: Not on file  Transportation Needs: Not on file  Physical Activity: Not on file  Stress: Not on file  Social Connections: Not on file  Intimate Partner Violence: Not on file     Past Surgical History:  Procedure Laterality Date   APPENDECTOMY     CARDIAC CATHETERIZATION     CATARACT EXTRACTION W/PHACO Right 03/18/2016   Procedure: CATARACT EXTRACTION PHACO AND INTRAOCULAR LENS PLACEMENT ([place]);  Surgeon: Birder [name], MD;  Location: [facility];  Service: Ophthalmology;  Laterality: Right;  [place] 45.3 AP% 22.2 CDE 10.07 Fluid pack lot # [id number]   CATARACT EXTRACTION W/PHACO Left 04/08/2016   Procedure: CATARACT EXTRACTION PHACO AND INTRAOCULAR LENS PLACEMENT ([facility]);  Surgeon: Birder [name], MD;  Location: [facility];  Service: Ophthalmology;  Laterality: Left;  [place] 00:41 AP% 15.0 CDE 6.27 Fluid pack lot # [id number]   LOOP RECORDER INSERTION N/A 10/10/2016   Procedure: Loop Recorder Insertion;  Surgeon: Evans Lance, MD;  Location: [facility];  Service: Cardiovascular;  Laterality: N/A;   LOOP RECORDER REMOVAL  07/2020   SKIN CANCER EXCISION     scalp   TOTAL KNEE ARTHROPLASTY Left 03/12/2021   Procedure: TOTAL KNEE ARTHROPLASTY;  Surgeon: Paralee Cancel, MD;  Location: [facility];  Service: Orthopedics;  Laterality: Left;   TUBAL LIGATION     VAGINAL HYSTERECTOMY     WISDOM TOOTH EXTRACTION      Family History  Problem Relation Age of Onset   Alzheimer's disease Mother    Heart disease Father    Arthritis/Rheumatoid Sister    Colon cancer Neg Hx    Stomach cancer Neg Hx    Rectal cancer Neg Hx    Esophageal cancer Neg Hx    Colon polyps Neg Hx     Allergies  Allergen Reactions   Penicillin G Swelling and Rash   Penicillins Anaphylaxis and Hives    Has patient had a PCN reaction causing immediate rash, facial/tongue/throat swelling, SOB or lightheadedness with hypotension: {Yes Has patient had a PCN reaction causing severe rash involving mucus membranes or skin necrosis: {no Has patient had a PCN reaction that required hospitalization {yes Has patient had a PCN reaction occurring within the last 10 years: {no If all of the above answers are  "NO", then may proceed with Cephalosporin use.   Sulfa Antibiotics Hives   Sulfasalazine Hives   Cefaclor Itching and Rash   Cefuroxime Axetil Itching and Rash   Clindamycin Itching and Rash   Clindamycin/Lincomycin Itching and Rash   Lansoprazole Other (See Comments)    REACTION: headaches   Latex Itching and Rash   Lincomycin Itching and Rash   Lincomycin Hcl Itching and Rash   Sulfamethoxazole-Trimethoprim Rash    Current Outpatient Medications on File Prior to Visit  Medication Sig Dispense Refill   acetaminophen (TYLENOL) 500 MG tablet Take 1,000 mg by mouth every 6 (six) hours as  needed for moderate pain or headache.     amLODipine (NORVASC) 2.5 MG tablet TAKE ONE TABLET EVERY DAY 90 tablet 3   carvedilol (COREG) 12.5 MG tablet Take 1 tablet (12.5 mg total) by mouth 2 (two) times daily with a meal. 180 tablet 3   Cholecalciferol (VITAMIN D3) 2000 units capsule Take 2,000 Units by mouth daily.     enalapril (VASOTEC) 10 MG tablet TAKE ONE TABLET EVERY DAY 90 tablet 3   escitalopram (LEXAPRO) 20 MG tablet TAKE 1 TABLET BY MOUTH DAILY FOR ANXIETY **NEED FOR OFFICE USE VISIT FOR FUTURE REFILLS** 30 tablet 0   famotidine (PEPCID) 40 MG tablet Take 1 tablet (40 mg total) by mouth at bedtime. 90 tablet 4   methocarbamol (ROBAXIN) 500 MG tablet Take 1 tablet (500 mg total) by mouth every 6 (six) hours as needed for muscle spasms. 40 tablet 0   metroNIDAZOLE (METROCREAM) 0.75 % cream Apply topically daily as needed (rash).     Multiple Vitamin (MULTIVITAMIN) capsule Take 1 capsule by mouth daily.     pantoprazole (PROTONIX) 40 MG tablet TAKE 1 TABLET BY MOUTH TWICE DAILY AS NEEDED FOR HEARTBURN 180 tablet 1   Polyvinyl Alcohol-Povidone (REFRESH OP) Place 1 drop into both eyes daily as needed (dry eyes). Eye drops w Omega three oil ingredients     rosuvastatin (CRESTOR) 20 MG tablet TAKE ONE TABLET EVERY DAY FOR CHOLESTEROL 90 tablet 0   zolpidem (AMBIEN) 10 MG tablet Take 1 tablet (10 mg  total) by mouth at bedtime as needed for sleep. Office visit required for further refills. 90 tablet 0   meloxicam (MOBIC) 15 MG tablet meloxicam 15 mg tablet     nitroGLYCERIN (NITROSTAT) 0.4 MG SL tablet Place 1 tablet (0.4 mg total) under the tongue every 5 (five) minutes as needed for chest pain. 30 tablet 3   No current facility-administered medications on file prior to visit.    BP 124/72    Pulse 78    Temp 98.6 F (37 C) (Temporal)    Ht 5\' 2"  (1.575 m)    Wt 127 lb (57.6 kg)    SpO2 97%    BMI 23.23 kg/m  Objective:   Physical Exam HENT:     Right Ear: Tympanic membrane and ear canal normal.     Left Ear: Tympanic membrane and ear canal normal.     Nose: Nose normal.  Eyes:     Conjunctiva/sclera: Conjunctivae normal.     Pupils: Pupils are equal, round, and reactive to light.  Neck:     Thyroid: No thyromegaly.  Cardiovascular:     Rate and Rhythm: Normal rate and regular rhythm.     Heart sounds: No murmur heard. Pulmonary:     Effort: Pulmonary effort is normal.     Breath sounds: Normal breath sounds. No rales.  Abdominal:     General: Bowel sounds are normal.     Palpations: Abdomen is soft.     Tenderness: There is no abdominal tenderness.  Musculoskeletal:     Cervical back: Neck supple.     Right knee: No swelling. Normal range of motion.     Left knee: Swelling present. Decreased range of motion.  Lymphadenopathy:     Cervical: No cervical adenopathy.  Skin:    General: Skin is warm and dry.     Findings: No rash.  Neurological:     Mental Status: She is alert and oriented to person, place, and time.  Cranial Nerves: No cranial nerve deficit.     Deep Tendon Reflexes: Reflexes are normal and symmetric.  Psychiatric:        Mood and Affect: Mood normal.          Assessment & Plan:      This visit occurred during the SARS-CoV-2 public health emergency.  Safety protocols were in place, including screening questions prior to the visit,  additional usage of staff PPE, and extensive cleaning of exam room while observing appropriate contact time as indicated for disinfecting solutions.

## 2021-05-01 NOTE — Assessment & Plan Note (Signed)
Stable.  Following with GI.  Continue pantoprazole 40 mg in the morning and famotidine 20 mg at night.

## 2021-05-01 NOTE — Addendum Note (Signed)
Addended by: Francella Solian on: 05/01/2021 10:13 AM   Modules accepted: Orders

## 2021-05-01 NOTE — Assessment & Plan Note (Signed)
Denies concerns.  Continue to monitor.

## 2021-05-01 NOTE — Assessment & Plan Note (Signed)
Controlled.  Continue amlodipine 2.5 mg daily, carvedilol 12.5 mg twice daily, enalapril 10 mg daily.  Labs reviewed from January 2023.

## 2021-05-03 ENCOUNTER — Other Ambulatory Visit: Payer: Self-pay | Admitting: Family Medicine

## 2021-05-03 DIAGNOSIS — Z4789 Encounter for other orthopedic aftercare: Secondary | ICD-10-CM | POA: Diagnosis not present

## 2021-05-03 DIAGNOSIS — Z471 Aftercare following joint replacement surgery: Secondary | ICD-10-CM | POA: Diagnosis not present

## 2021-05-03 DIAGNOSIS — Z96652 Presence of left artificial knee joint: Secondary | ICD-10-CM | POA: Diagnosis not present

## 2021-05-03 DIAGNOSIS — N3001 Acute cystitis with hematuria: Secondary | ICD-10-CM

## 2021-05-03 LAB — URINE CULTURE
MICRO NUMBER:: 13042663
SPECIMEN QUALITY:: ADEQUATE

## 2021-05-03 MED ORDER — NITROFURANTOIN MONOHYD MACRO 100 MG PO CAPS: 100.0000 mg | ORAL_CAPSULE | Freq: Two times a day (BID) | ORAL | 0 refills | Status: AC

## 2021-05-06 DIAGNOSIS — M25662 Stiffness of left knee, not elsewhere classified: Secondary | ICD-10-CM | POA: Diagnosis not present

## 2021-05-06 DIAGNOSIS — M25562 Pain in left knee: Secondary | ICD-10-CM | POA: Diagnosis not present

## 2021-05-09 DIAGNOSIS — M25562 Pain in left knee: Secondary | ICD-10-CM | POA: Diagnosis not present

## 2021-05-09 DIAGNOSIS — M25662 Stiffness of left knee, not elsewhere classified: Secondary | ICD-10-CM | POA: Diagnosis not present

## 2021-05-13 DIAGNOSIS — M25562 Pain in left knee: Secondary | ICD-10-CM | POA: Diagnosis not present

## 2021-05-13 DIAGNOSIS — M25662 Stiffness of left knee, not elsewhere classified: Secondary | ICD-10-CM | POA: Diagnosis not present

## 2021-05-16 DIAGNOSIS — M25662 Stiffness of left knee, not elsewhere classified: Secondary | ICD-10-CM | POA: Diagnosis not present

## 2021-05-16 DIAGNOSIS — M25562 Pain in left knee: Secondary | ICD-10-CM | POA: Diagnosis not present

## 2021-05-21 DIAGNOSIS — M25562 Pain in left knee: Secondary | ICD-10-CM | POA: Diagnosis not present

## 2021-05-21 DIAGNOSIS — M25662 Stiffness of left knee, not elsewhere classified: Secondary | ICD-10-CM | POA: Diagnosis not present

## 2021-05-23 DIAGNOSIS — M25562 Pain in left knee: Secondary | ICD-10-CM | POA: Diagnosis not present

## 2021-05-23 DIAGNOSIS — M25662 Stiffness of left knee, not elsewhere classified: Secondary | ICD-10-CM | POA: Diagnosis not present

## 2021-05-28 DIAGNOSIS — M25562 Pain in left knee: Secondary | ICD-10-CM | POA: Diagnosis not present

## 2021-05-28 DIAGNOSIS — M25662 Stiffness of left knee, not elsewhere classified: Secondary | ICD-10-CM | POA: Diagnosis not present

## 2021-05-30 DIAGNOSIS — M25662 Stiffness of left knee, not elsewhere classified: Secondary | ICD-10-CM | POA: Diagnosis not present

## 2021-05-30 DIAGNOSIS — M25562 Pain in left knee: Secondary | ICD-10-CM | POA: Diagnosis not present

## 2021-06-10 ENCOUNTER — Other Ambulatory Visit: Payer: Self-pay | Admitting: Primary Care

## 2021-06-10 DIAGNOSIS — G4700 Insomnia, unspecified: Secondary | ICD-10-CM

## 2021-06-11 ENCOUNTER — Telehealth: Payer: Self-pay | Admitting: Primary Care

## 2021-06-11 DIAGNOSIS — Z0100 Encounter for examination of eyes and vision without abnormal findings: Secondary | ICD-10-CM | POA: Diagnosis not present

## 2021-06-11 NOTE — Telephone Encounter (Signed)
Thank you for scheduling the appointment, she will need the appointment in order for [place] to order the ultrasound.  ?

## 2021-06-11 NOTE — Telephone Encounter (Signed)
Patient is calling requesting a referral for an doppler ultrasound ? ?Saw her surgeon for her 3 month post-op knee surgery and told him that for the last 3-4 weeks she has been experiencing an achy, tingling, burning feeling in her leg and veins popping out ? ?Surgeon suggested ultrasound and for it to be referred by PCP ? ?Scheduled patient for an appointment on 4.6.23 @ 3pm, however, please call the patient back if an appointment is not needed for the referral ?

## 2021-06-11 NOTE — Telephone Encounter (Signed)
Please advise 

## 2021-06-13 ENCOUNTER — Ambulatory Visit (INDEPENDENT_AMBULATORY_CARE_PROVIDER_SITE_OTHER): Payer: Medicare HMO | Admitting: Primary Care

## 2021-06-13 ENCOUNTER — Ambulatory Visit
Admission: RE | Admit: 2021-06-13 | Discharge: 2021-06-13 | Disposition: A | Discharge status: Home / Self Care | Payer: Medicare HMO | Source: Ambulatory Visit | Attending: Primary Care | Admitting: Primary Care

## 2021-06-13 ENCOUNTER — Encounter: Payer: Self-pay | Admitting: Primary Care

## 2021-06-13 VITALS — BP 122/66 | HR 90 | Ht 62.0 in | Wt 130.2 lb

## 2021-06-13 DIAGNOSIS — M7989 Other specified soft tissue disorders: Secondary | ICD-10-CM | POA: Insufficient documentation

## 2021-06-13 DIAGNOSIS — M79605 Pain in left leg: Secondary | ICD-10-CM | POA: Diagnosis not present

## 2021-06-13 NOTE — Assessment & Plan Note (Signed)
Obvious swelling on exam today. ? ?Need to rule out DVT in light of recent surgery. ? ?Stat venous ultrasound ordered and pending. ?We have alerted the stat orders pool team regarding this order. ? ? ?

## 2021-06-13 NOTE — Patient Instructions (Signed)
You will be contacted regarding your ultrasound today.  ? ?Let me know if you don't hear from anyone. ? ?It was a pleasure to see you today! ? ? ? ? ? ?

## 2021-06-13 NOTE — Progress Notes (Signed)
? ?Subjective:  ? ? Patient ID: [name], female    DOB: 12-07-1945, 76 y.o.   MRN: [id number] ? ?HPI ? ?[name] is a very pleasant 76 y.o. female with a history of cardiomyopathy, hypertension, hyperlipidemia, chronic back pain, s/p left total knee arthoplasty since January 2023 who presents today to discuss left lower extremity pain. ? ?She is s/p left total knee replacement since 03/12/21. She is active in physical therapy. A few weeks ago she noticed left calf and lower leg swelling with pain down to her ankle.  She continues to struggle with chronic left knee pain since her surgery.   ? ?She saw her orthopedic doctor last week, she notified him of these symptoms and was told that she should get an ultrasound but that she couldn't get it done in his office. He did not order an ultrasound.  ? ?She denies trauma/injury, long travel. She is a non smoker.  ? ?BP Readings from Last 3 Encounters:  ?06/13/21 122/66  ?05/01/21 124/72  ?03/13/21 (!) 121/56  ? ? ? ?Review of Systems  ?Cardiovascular:  Positive for leg swelling.  ?Musculoskeletal:  Positive for arthralgias.  ?Skin:  Negative for color change.  ?Neurological:  Negative for numbness.  ? ?   ? ? ?Past Medical History:  ?Diagnosis Date  ? Anxiety   ? when husband passed  ? Arthritis   ? generalized  ? Cardiomyopathy   ? nonischemic  ? Cataract   ? bilateral sx  ? CHF (congestive heart failure) ([place])   ? Chronic back pain   ? Complication of anesthesia   ? Erosive esophagitis   ? LA Classification Grade B  ? Essential hypertension   ? on meds  ? GERD (gastroesophageal reflux disease)   ? Hiatal hernia   ? Hyperlipidemia   ? on meds  ? Insomnia   ? MDD (major depressive disorder)   ? situational - when husband passed  ? Melanoma ([place])   ? basil cell, pt reports no melanoma  ? Migraine   ? Osteopenia   ? PONV (postoperative nausea and vomiting)   ? Rosacea   ? Sessile colonic polyp 10/2011  ? adenoma  ? Squamous carcinoma   ? scalp  ? ? ?Social  History  ? ?Socioeconomic History  ? Marital status: Widowed  ?  Spouse name: Not on file  ? Number of children: 3  ? Years of education: 8  ? Highest education level: Not on file  ?Occupational History  ? Not on file  ?Tobacco Use  ? Smoking status: Former  ?  Types: Cigarettes  ?  Quit date: 06/03/1981  ?  Years since quitting: 40.0  ? Smokeless tobacco: Never  ?Vaping Use  ? Vaping Use: Never used  ?Substance and Sexual Activity  ? Alcohol use: Yes  ?  Alcohol/week: 4.0 standard drinks  ?  Types: 4 Glasses of wine per week  ?  Comment: Wine per night  ? Drug use: No  ? Sexual activity: Not Currently  ?Other Topics Concern  ? Not on file  ?Social History Narrative  ? Lives alone in two-story home.    ? Her husband passed away unexpectedly.    ? They have three children- one is a pediatrician.  ? Desires CPR.  ? Caffeine use: Decaf coffee  ? Right handed  ? ?Social Determinants of Health  ? ?Financial Resource Strain: Not on file  ?Food Insecurity: Not on file  ?Transportation Needs: Not  on file  ?Physical Activity: Not on file  ?Stress: Not on file  ?Social Connections: Not on file  ?Intimate Partner Violence: Not on file  ? ? ?Past Surgical History:  ?Procedure Laterality Date  ? APPENDECTOMY    ? CARDIAC CATHETERIZATION    ? CATARACT EXTRACTION W/PHACO Right 03/18/2016  ? Procedure: CATARACT EXTRACTION PHACO AND INTRAOCULAR LENS PLACEMENT ([facility]);  Surgeon: Birder [name], MD;  Location: [facility];  Service: Ophthalmology;  Laterality: Right;  [place] 45.3 ?AP% 22.2 ?CDE 10.07 ?Fluid pack lot # [id number]  ? CATARACT EXTRACTION W/PHACO Left 04/08/2016  ? Procedure: CATARACT EXTRACTION PHACO AND INTRAOCULAR LENS PLACEMENT ([facility]);  Surgeon: Birder [name], MD;  Location: [facility];  Service: Ophthalmology;  Laterality: Left;  [place] 00:41 ?AP% 15.0 ?CDE 6.27 ?Fluid pack lot # [id number]  ? LOOP RECORDER INSERTION N/A 10/10/2016  ? Procedure: Loop Recorder Insertion;  Surgeon: Evans Lance, MD;  Location: [facility];   Service: Cardiovascular;  Laterality: N/A;  ? LOOP RECORDER REMOVAL  07/2020  ? SKIN CANCER EXCISION    ? scalp  ? TOTAL KNEE ARTHROPLASTY Left 03/12/2021  ? Procedure: TOTAL KNEE ARTHROPLASTY;  Surgeon: Paralee Cancel, MD;  Location: [facility];  Service: Orthopedics;  Laterality: Left;  ? TUBAL LIGATION    ? VAGINAL HYSTERECTOMY    ? WISDOM TOOTH EXTRACTION    ? ? ?Family History  ?Problem Relation Age of Onset  ? Alzheimer's disease Mother   ? Heart disease Father   ? Arthritis/Rheumatoid Sister   ? Colon cancer Neg Hx   ? Stomach cancer Neg Hx   ? Rectal cancer Neg Hx   ? Esophageal cancer Neg Hx   ? Colon polyps Neg Hx   ? ? ?Allergies  ?Allergen Reactions  ? Penicillin G Swelling and Rash  ? Penicillins Anaphylaxis and Hives  ?  Has patient had a PCN reaction causing immediate rash, facial/tongue/throat swelling, SOB or lightheadedness with hypotension: {Yes ?Has patient had a PCN reaction causing severe rash involving mucus membranes or skin necrosis: {no ?Has patient had a PCN reaction that required hospitalization {yes ?Has patient had a PCN reaction occurring within the last 10 years: {no ?If all of the above answers are "NO", then may proceed with Cephalosporin use.  ? Sulfa Antibiotics Hives  ? Sulfasalazine Hives  ? Cefaclor Itching and Rash  ? Cefuroxime Axetil Itching and Rash  ? Clindamycin Itching and Rash  ? Clindamycin/Lincomycin Itching and Rash  ? Lansoprazole Other (See Comments)  ?  REACTION: headaches  ? Latex Itching and Rash  ? Lincomycin Itching and Rash  ? Lincomycin Hcl Itching and Rash  ? Sulfamethoxazole-Trimethoprim Rash  ? ? ?Current Outpatient Medications on File Prior to Visit  ?Medication Sig Dispense Refill  ? acetaminophen (TYLENOL) 500 MG tablet Take 1,000 mg by mouth every 6 (six) hours as needed for moderate pain or headache.    ? amLODipine (NORVASC) 2.5 MG tablet TAKE ONE TABLET EVERY DAY 90 tablet 3  ? carvedilol (COREG) 12.5 MG tablet TAKE ONE TABLET TWICE DAILY WITH MEALS 120  tablet 0  ? Cholecalciferol (VITAMIN D3) 2000 units capsule Take 2,000 Units by mouth daily.    ? enalapril (VASOTEC) 10 MG tablet TAKE ONE TABLET EVERY DAY 90 tablet 3  ? escitalopram (LEXAPRO) 20 MG tablet TAKE 1 TABLET BY MOUTH DAILY FOR ANXIETY 90 tablet 3  ? famotidine (PEPCID) 40 MG tablet Take 1 tablet (40 mg total) by mouth at bedtime. 90 tablet 4  ?  meloxicam (MOBIC) 15 MG tablet meloxicam 15 mg tablet    ? metroNIDAZOLE (METROCREAM) 0.75 % cream Apply topically daily as needed (rash).    ? Multiple Vitamin (MULTIVITAMIN) capsule Take 1 capsule by mouth daily.    ? pantoprazole (PROTONIX) 40 MG tablet TAKE 1 TABLET BY MOUTH TWICE DAILY AS NEEDED FOR HEARTBURN 180 tablet 1  ? Polyvinyl Alcohol-Povidone (REFRESH OP) Place 1 drop into both eyes daily as needed (dry eyes). Eye drops w Omega three oil ingredients    ? rosuvastatin (CRESTOR) 20 MG tablet TAKE ONE TABLET EVERY DAY FOR CHOLESTEROL 90 tablet 0  ? zolpidem (AMBIEN) 10 MG tablet TAKE ONE TABLET AT BEDTIME IF NEEDED FORSLEEP 90 tablet 0  ? nitroGLYCERIN (NITROSTAT) 0.4 MG SL tablet Place 1 tablet (0.4 mg total) under the tongue every 5 (five) minutes as needed for chest pain. 30 tablet 3  ? ?No current facility-administered medications on file prior to visit.  ? ? ?BP 122/66   Pulse 90   Ht '5\' 2"'$  (1.575 m)   Wt 130 lb 3.2 oz (59.1 kg)   SpO2 97%   BMI 23.81 kg/m?  ?Objective:  ? Physical Exam ?Cardiovascular:  ?   Pulses:     ?     Dorsalis pedis pulses are 2+ on the left side.  ?   Comments: Left calf and ankle swelling with tightness. Tenderness to calf.  ?Pulmonary:  ?   Effort: Pulmonary effort is normal.  ?Musculoskeletal:  ?   Left lower leg: Edema present.  ?Skin: ?   General: Skin is warm and dry.  ?   Findings: No erythema.  ? ? ? ? ? ?   ?Assessment & Plan:  ? ? ? ? ?This visit occurred during the SARS-CoV-2 public health emergency.  Safety protocols were in place, including screening questions prior to the visit, additional usage of  staff PPE, and extensive cleaning of exam room while observing appropriate contact time as indicated for disinfecting solutions.  ?

## 2021-07-01 ENCOUNTER — Telehealth: Payer: Self-pay | Admitting: Primary Care

## 2021-07-01 NOTE — Telephone Encounter (Signed)
Pt called asking if she could speak with the nurse about her left calf, states it is still really tight and is concerned on what to do next.  ? ?Callback Number: [phone] ?

## 2021-07-01 NOTE — Telephone Encounter (Signed)
Would ideally recommend new evaluation if any redness, worsening swelling, or fevers. ? ?If exam is unchanged she could try elevating the leg, minimizing salt intake, wearing compression socks. ? ?No improvement with these things would recommend evaluation in person. ?

## 2021-07-01 NOTE — Telephone Encounter (Signed)
Patient was seen by [name] on 06-13-21. She did have neg u/s done on that day. She has not had any changes at all in her symptoms. She has tightness in calf. Has not changed at all. Do you need to see in office?  ?

## 2021-07-01 NOTE — Telephone Encounter (Signed)
Number not taking calls at this time.  ?

## 2021-07-02 NOTE — Telephone Encounter (Signed)
Pt notified as instructed and voiced understanding.pt said she does not have redness but the calf muscle is very tight and pt has sharp pain on and off but mostly a throbbing pain continuous down the front of her leg to top of foot.Meloxicam and tylenol not helping the pain. 07/01/21 pain level was 8 and today pain level is 6-7.  No CP and no SOB. Pt said she is not able to sleep at night due to pain.when sitting pt does elevate her legs and doing PT exercises. No increase in salt intake and pt eats minimal salt. No known injury. Pt wonders if should see neurologist; pt scheduled appt with Dr Einar Pheasant on 07/03/21 at 9:20. Pt has a trip coming up in May that is requiring pt to fly by herself and hopes this leg issue will be resolved prior to that. UC & ED precautions given and pt voiced understanding. Sending note to DR Einar Pheasant and [place] CMA. ?

## 2021-07-02 NOTE — Telephone Encounter (Signed)
Agree with recommendations and plan. Will see tomorrow ?

## 2021-07-03 ENCOUNTER — Other Ambulatory Visit: Payer: Self-pay | Admitting: Family Medicine

## 2021-07-03 ENCOUNTER — Ambulatory Visit (INDEPENDENT_AMBULATORY_CARE_PROVIDER_SITE_OTHER): Payer: Medicare HMO | Admitting: Family Medicine

## 2021-07-03 ENCOUNTER — Ambulatory Visit (INDEPENDENT_AMBULATORY_CARE_PROVIDER_SITE_OTHER)
Admission: RE | Admit: 2021-07-03 | Discharge: 2021-07-03 | Disposition: A | Discharge status: Home / Self Care | Payer: Medicare HMO | Source: Ambulatory Visit | Attending: Family Medicine | Admitting: Family Medicine

## 2021-07-03 VITALS — BP 102/60 | HR 70 | Temp 98.0°F | Ht 62.0 in | Wt 130.4 lb

## 2021-07-03 DIAGNOSIS — M25562 Pain in left knee: Secondary | ICD-10-CM | POA: Diagnosis not present

## 2021-07-03 DIAGNOSIS — M79605 Pain in left leg: Secondary | ICD-10-CM

## 2021-07-03 DIAGNOSIS — Z96652 Presence of left artificial knee joint: Secondary | ICD-10-CM | POA: Diagnosis not present

## 2021-07-03 DIAGNOSIS — I429 Cardiomyopathy, unspecified: Secondary | ICD-10-CM | POA: Diagnosis not present

## 2021-07-03 DIAGNOSIS — I10 Essential (primary) hypertension: Secondary | ICD-10-CM

## 2021-07-03 NOTE — Assessment & Plan Note (Signed)
Blood pressure low today.  Discussed with patient that as she is not feeling lightheaded, dizzy, and is otherwise feeling normal would recommend increasing hydration and monitoring her blood pressure.  She will pick up a blood pressure cuff and monitor at home.  She did already take her home medications amlodipine 2.5 mg, carvedilol 12.5 mg, enalapril 10 mg.  She does follow with cardiology.  Advised due to cardiomyopathy that she call cardiology if her blood pressure is remaining low to consider which medication she should hold.  Is asymptomatic and already took her home meds no medication changes right now. ?

## 2021-07-03 NOTE — Assessment & Plan Note (Signed)
She is approximately 4 months out from a knee replacement surgery.  Symptoms only started over the last 6 weeks.  Ultrasound for DVT was negative.  She does have pain over the shin discussed getting x-ray to evaluate the knee replacement as well as them for surrounding structures.  Discussed with Dr. Lorelei Pont sports medicine who said that pain can last up to 1 year.  And asked about nerve block, advised checking with Ortho to see if they have other options for pain control and if not we could consider a pain referral or a follow-up appointment with Dr. Lorelei Pont. ?

## 2021-07-03 NOTE — Patient Instructions (Signed)
Knee pain after a surgery can take about 1 year to settle in ? ?We will get an X-ray today to evaluate ? ?Blood pressure ?- if you are feeling fine nothing to worry about ?- drink water today ?- check your blood pressure at home and if low you can skip a dose of your blood pressure medication ?

## 2021-07-03 NOTE — Progress Notes (Signed)
? ?Subjective:  ? ?  ?[name] is a 76 y.o. female presenting for Leg Pain (L calf post knee replacement in Jan) ?  ? ? ?Leg Pain  ? ? ?#Left calf pain ?- knee replacement on Mar 12, 2021 ?- saw Anda Kraft on 06/13/2021 ?- was having throbbing in the calf and radiating down the anterior leg ?- mentioned to her ortho doctor who recommended that she get an [place] ?- r/o blood clot ?- has noticed more prominent veins in that leg ?- has been taking meloxicam w/o improvement ?- icing and elevating the leg ?- throbbing pain 24/7 aching ?- when she walks it is worse but not improved with rest ?- did physical therapy for 3 months - continues to do exercises at home ?- this pain started 6 weeks ago ?-  ? ? ?Review of Systems ? ? ?Social History  ? ?Tobacco Use  ?Smoking Status Former  ? Types: Cigarettes  ? Quit date: 06/03/1981  ? Years since quitting: 40.1  ?Smokeless Tobacco Never  ? ? ? ?   ?Objective:  ?  ?BP Readings from Last 3 Encounters:  ?07/03/21 102/60  ?06/13/21 122/66  ?05/01/21 124/72  ? ?Wt Readings from Last 3 Encounters:  ?07/03/21 130 lb 6 oz (59.1 kg)  ?06/13/21 130 lb 3.2 oz (59.1 kg)  ?05/01/21 127 lb (57.6 kg)  ? ? ?BP 102/60   Pulse 70   Temp 98 ?F (36.7 ?C) (Oral)   Ht '5\' 2"'$  (1.575 m)   Wt 130 lb 6 oz (59.1 kg)   SpO2 99%   BMI 23.85 kg/m?  ? ? ?Physical Exam ?Constitutional:   ?   General: She is not in acute distress. ?   Appearance: She is well-developed. She is not diaphoretic.  ?HENT:  ?   Right Ear: External ear normal.  ?   Left Ear: External ear normal.  ?   Nose: Nose normal.  ?Eyes:  ?   Conjunctiva/sclera: Conjunctivae normal.  ?Cardiovascular:  ?   Rate and Rhythm: Normal rate and regular rhythm.  ?   Pulses:     ?     Dorsalis pedis pulses are 2+ on the right side and 2+ on the left side.  ?     Posterior tibial pulses are 2+ on the right side and 2+ on the left side.  ?Pulmonary:  ?   Effort: Pulmonary effort is normal.  ?   Breath sounds: Normal breath sounds.  ?Musculoskeletal:  ?    Cervical back: Neck supple.  ?   Comments: Left knee ?Inspection: surgical scar healed, no erythema, slight swelling ?Palpation: ttp along the shin, worse near the knee ?  ?Skin: ?   General: Skin is warm and dry.  ?   Capillary Refill: Capillary refill takes less than 2 seconds.  ?Neurological:  ?   Mental Status: She is alert. Mental status is at baseline.  ?Psychiatric:     ?   Mood and Affect: Mood normal.     ?   Behavior: Behavior normal.  ? ? ? ?Left knee XR (my read): hardware present, no separation noted, no fracture ? ?   ?Assessment & Plan:  ? ?Problem List Items Addressed This Visit   ? ?  ? Cardiovascular and Mediastinum  ? Cardiomyopathy ([place])  ? Essential hypertension  ?  Blood pressure low today.  Discussed with patient that as she is not feeling lightheaded, dizzy, and is otherwise feeling normal would recommend increasing hydration  and monitoring her blood pressure.  She will pick up a blood pressure cuff and monitor at home.  She did already take her home medications amlodipine 2.5 mg, carvedilol 12.5 mg, enalapril 10 mg.  She does follow with cardiology.  Advised due to cardiomyopathy that she call cardiology if her blood pressure is remaining low to consider which medication she should hold.  Is asymptomatic and already took her home meds no medication changes right now. ? ?  ?  ?  ? Other  ? Left leg pain - Primary  ?  She is approximately 4 months out from a knee replacement surgery.  Symptoms only started over the last 6 weeks.  Ultrasound for DVT was negative.  She does have pain over the shin discussed getting x-ray to evaluate the knee replacement as well as them for surrounding structures.  Discussed with Dr. Lorelei Pont sports medicine who said that pain can last up to 1 year.  And asked about nerve block, advised checking with Ortho to see if they have other options for pain control and if not we could consider a pain referral or a follow-up appointment with Dr. Lorelei Pont. ? ?  ?  ? Relevant  Orders  ? DG Knee Complete 4 Views Left  ? ?Other Visit Diagnoses   ? ? Status post left knee replacement      ? Relevant Orders  ? DG Knee Complete 4 Views Left  ? ?  ? ? ? ?Return if symptoms worsen or fail to improve. ? ?Lesleigh Noe, MD ? ? ? ?

## 2021-07-05 ENCOUNTER — Other Ambulatory Visit: Payer: Self-pay | Admitting: Gastroenterology

## 2021-07-05 DIAGNOSIS — K2100 Gastro-esophageal reflux disease with esophagitis, without bleeding: Secondary | ICD-10-CM

## 2021-07-11 DIAGNOSIS — M25562 Pain in left knee: Secondary | ICD-10-CM | POA: Diagnosis not present

## 2021-07-11 DIAGNOSIS — M79605 Pain in left leg: Secondary | ICD-10-CM | POA: Diagnosis not present

## 2021-07-11 DIAGNOSIS — M79662 Pain in left lower leg: Secondary | ICD-10-CM | POA: Diagnosis not present

## 2021-07-12 ENCOUNTER — Other Ambulatory Visit: Payer: Self-pay | Admitting: Internal Medicine

## 2021-07-12 DIAGNOSIS — E7800 Pure hypercholesterolemia, unspecified: Secondary | ICD-10-CM

## 2021-07-16 DIAGNOSIS — I87393 Chronic venous hypertension (idiopathic) with other complications of bilateral lower extremity: Secondary | ICD-10-CM | POA: Diagnosis not present

## 2021-07-17 ENCOUNTER — Other Ambulatory Visit: Payer: Self-pay | Admitting: Primary Care

## 2021-07-17 DIAGNOSIS — D485 Neoplasm of uncertain behavior of skin: Secondary | ICD-10-CM | POA: Diagnosis not present

## 2021-07-17 DIAGNOSIS — Z85828 Personal history of other malignant neoplasm of skin: Secondary | ICD-10-CM | POA: Diagnosis not present

## 2021-07-17 DIAGNOSIS — D2261 Melanocytic nevi of right upper limb, including shoulder: Secondary | ICD-10-CM | POA: Diagnosis not present

## 2021-07-17 DIAGNOSIS — D2262 Melanocytic nevi of left upper limb, including shoulder: Secondary | ICD-10-CM | POA: Diagnosis not present

## 2021-07-17 DIAGNOSIS — D2272 Melanocytic nevi of left lower limb, including hip: Secondary | ICD-10-CM | POA: Diagnosis not present

## 2021-07-17 DIAGNOSIS — L570 Actinic keratosis: Secondary | ICD-10-CM | POA: Diagnosis not present

## 2021-07-17 DIAGNOSIS — D0471 Carcinoma in situ of skin of right lower limb, including hip: Secondary | ICD-10-CM | POA: Diagnosis not present

## 2021-07-17 DIAGNOSIS — X32XXXA Exposure to sunlight, initial encounter: Secondary | ICD-10-CM | POA: Diagnosis not present

## 2021-07-18 ENCOUNTER — Ambulatory Visit: Payer: Medicare HMO | Admitting: Primary Care

## 2021-07-22 DIAGNOSIS — I872 Venous insufficiency (chronic) (peripheral): Secondary | ICD-10-CM | POA: Diagnosis not present

## 2021-07-22 DIAGNOSIS — R600 Localized edema: Secondary | ICD-10-CM | POA: Diagnosis not present

## 2021-07-23 ENCOUNTER — Ambulatory Visit (INDEPENDENT_AMBULATORY_CARE_PROVIDER_SITE_OTHER): Payer: Medicare HMO

## 2021-07-23 VITALS — Ht 62.0 in | Wt 130.0 lb

## 2021-07-23 DIAGNOSIS — Z0000 Encounter for general adult medical examination without abnormal findings: Secondary | ICD-10-CM

## 2021-07-23 NOTE — Patient Instructions (Addendum)
?Ms. Kanan , ?Thank you for taking time to come for your Medicare Wellness Visit. I appreciate your ongoing commitment to your health goals. Please review the following plan we discussed and let me know if I can assist you in the future.  ? ?These are the goals we discussed: ? Goals   ? ?   Increase walking routine (pt-stated)   ?   Walk as tolerated. ?  ? ?  ?  ?This is a list of the screening recommended for you and due dates:  ?Health Maintenance  ?Topic Date Due  ? Pneumonia Vaccine (2 - PPSV23 if available, else PCV20) 07/24/2022*  ? Tetanus Vaccine  07/24/2022*  ? Flu Shot  10/08/2021  ? DEXA scan (bone density measurement)  Completed  ? Hepatitis C Screening: USPSTF Recommendation to screen - Ages 11-79 yo.  Completed  ? Zoster (Shingles) Vaccine  Completed  ? HPV Vaccine  Aged Out  ? Colon Cancer Screening  Discontinued  ? COVID-19 Vaccine  Discontinued  ?*Topic was postponed. The date shown is not the original due date.  ? ?Advanced directives: Yes Patient will submit copy ? ?Conditions/risks identified: None ? ?Next appointment: Follow up in one year for your annual wellness visit  ? ? ? ?Preventive Care 30 Years and Older, Female ?Preventive care refers to lifestyle choices and visits with your health care provider that can promote health and wellness. ?What does preventive care include? ?A yearly physical exam. This is also called an annual well check. ?Dental exams once or twice a year. ?Routine eye exams. Ask your health care provider how often you should have your eyes checked. ?Personal lifestyle choices, including: ?Daily care of your teeth and gums. ?Regular physical activity. ?Eating a healthy diet. ?Avoiding tobacco and drug use. ?Limiting alcohol use. ?Practicing safe sex. ?Taking low-dose aspirin every day. ?Taking vitamin and mineral supplements as recommended by your health care provider. ?What happens during an annual well check? ?The services and screenings done by your health care  provider during your annual well check will depend on your age, overall health, lifestyle risk factors, and family history of disease. ?Counseling  ?Your health care provider may ask you questions about your: ?Alcohol use. ?Tobacco use. ?Drug use. ?Emotional well-being. ?Home and relationship well-being. ?Sexual activity. ?Eating habits. ?History of falls. ?Memory and ability to understand (cognition). ?Work and work Statistician. ?Reproductive health. ?Screening  ?You may have the following tests or measurements: ?Height, weight, and BMI. ?Blood pressure. ?Lipid and cholesterol levels. These may be checked every 5 years, or more frequently if you are over 46 years old. ?Skin check. ?Lung cancer screening. You may have this screening every year starting at age 74 if you have a 30-pack-year history of smoking and currently smoke or have quit within the past 15 years. ?Fecal occult blood test (FOBT) of the stool. You may have this test every year starting at age 54. ?Flexible sigmoidoscopy or colonoscopy. You may have a sigmoidoscopy every 5 years or a colonoscopy every 10 years starting at age 74. ?Hepatitis C blood test. ?Hepatitis B blood test. ?Sexually transmitted disease (STD) testing. ?Diabetes screening. This is done by checking your blood sugar (glucose) after you have not eaten for a while (fasting). You may have this done every 1-3 years. ?Bone density scan. This is done to screen for osteoporosis. You may have this done starting at age 8. ?Mammogram. This may be done every 1-2 years. Talk to your health care provider about how often you  should have regular mammograms. ?Talk with your health care provider about your test results, treatment options, and if necessary, the need for more tests. ?Vaccines  ?Your health care provider may recommend certain vaccines, such as: ?Influenza vaccine. This is recommended every year. ?Tetanus, diphtheria, and acellular pertussis (Tdap, Td) vaccine. You may need a Td  booster every 10 years. ?Zoster vaccine. You may need this after age 65. ?Pneumococcal 13-valent conjugate (PCV13) vaccine. One dose is recommended after age 74. ?Pneumococcal polysaccharide (PPSV23) vaccine. One dose is recommended after age 75. ?Talk to your health care provider about which screenings and vaccines you need and how often you need them. ?This information is not intended to replace advice given to you by your health care provider. Make sure you discuss any questions you have with your health care provider. ?Document Released: 03/23/2015 Document Revised: 11/14/2015 Document Reviewed: 12/26/2014 ?Elsevier Interactive Patient Education ? 2017 [employer]. ? ?Fall Prevention in the Home ?Falls can cause injuries. They can happen to people of all ages. There are many things you can do to make your home safe and to help prevent falls. ?What can I do on the outside of my home? ?Regularly fix the edges of walkways and driveways and fix any cracks. ?Remove anything that might make you trip as you walk through a door, such as a raised step or threshold. ?Trim any bushes or trees on the path to your home. ?Use bright outdoor lighting. ?Clear any walking paths of anything that might make someone trip, such as rocks or tools. ?Regularly check to see if handrails are loose or broken. Make sure that both sides of any steps have handrails. ?Any raised decks and porches should have guardrails on the edges. ?Have any leaves, snow, or ice cleared regularly. ?Use sand or salt on walking paths during winter. ?Clean up any spills in your garage right away. This includes oil or grease spills. ?What can I do in the bathroom? ?Use night lights. ?Install grab bars by the toilet and in the tub and shower. Do not use towel bars as grab bars. ?Use non-skid mats or decals in the tub or shower. ?If you need to sit down in the shower, use a plastic, non-slip stool. ?Keep the floor dry. Clean up any water that spills on the floor  as soon as it happens. ?Remove soap buildup in the tub or shower regularly. ?Attach bath mats securely with double-sided non-slip rug tape. ?Do not have throw rugs and other things on the floor that can make you trip. ?What can I do in the bedroom? ?Use night lights. ?Make sure that you have a light by your bed that is easy to reach. ?Do not use any sheets or blankets that are too big for your bed. They should not hang down onto the floor. ?Have a firm chair that has side arms. You can use this for support while you get dressed. ?Do not have throw rugs and other things on the floor that can make you trip. ?What can I do in the kitchen? ?Clean up any spills right away. ?Avoid walking on wet floors. ?Keep items that you use a lot in easy-to-reach places. ?If you need to reach something above you, use a strong step stool that has a grab bar. ?Keep electrical cords out of the way. ?Do not use floor polish or wax that makes floors slippery. If you must use wax, use non-skid floor wax. ?Do not have throw rugs and other things on the  floor that can make you trip. ?What can I do with my stairs? ?Do not leave any items on the stairs. ?Make sure that there are handrails on both sides of the stairs and use them. Fix handrails that are broken or loose. Make sure that handrails are as long as the stairways. ?Check any carpeting to make sure that it is firmly attached to the stairs. Fix any carpet that is loose or worn. ?Avoid having throw rugs at the top or bottom of the stairs. If you do have throw rugs, attach them to the floor with carpet tape. ?Make sure that you have a light switch at the top of the stairs and the bottom of the stairs. If you do not have them, ask someone to add them for you. ?What else can I do to help prevent falls? ?Wear shoes that: ?Do not have high heels. ?Have rubber bottoms. ?Are comfortable and fit you well. ?Are closed at the toe. Do not wear sandals. ?If you use a stepladder: ?Make sure that it is  fully opened. Do not climb a closed stepladder. ?Make sure that both sides of the stepladder are locked into place. ?Ask someone to hold it for you, if possible. ?Clearly mark and make sure that you can see: ?A

## 2021-07-23 NOTE — Progress Notes (Signed)
? ?Subjective:  ? [name] is a 76 y.o. female who presents for Medicare Annual (Subsequent) preventive examination. ? ?Review of Systems    ?Virtual Visit via Telephone Note ? ?I connected with  [name] on 07/23/21 at  3:45 PM EDT by telephone and verified that I am speaking with the correct person using two identifiers. ? ?Location: ?Patient: Home ?Provider: Office ?Persons participating in the virtual visit: patient/Nurse Health Advisor ?  ?I discussed the limitations, risks, security and privacy concerns of performing an evaluation and management service by telephone and the availability of in person appointments. The patient expressed understanding and agreed to proceed. ? ?Interactive audio and video telecommunications were attempted between this nurse and patient, however failed, due to patient having technical difficulties OR patient did not have access to video capability.  We continued and completed visit with audio only. ? ?Some vital signs may be absent or patient reported.  ? ?Criselda Peaches, LPN  ?Cardiac Risk Factors include: advanced age (>29mn, >>31women);hypertension ? ?   ?Objective:  ?  ?Today's Vitals  ? 07/23/21 1550  ?Weight: 130 lb (59 kg)  ?Height: '5\' 2"'$  (1.575 m)  ? ?Body mass index is 23.78 kg/m?. ? ? ?  07/23/2021  ?  4:02 PM 03/12/2021  ? 11:11 AM 02/27/2021  ? 10:03 AM 10/23/2019  ?  6:25 PM 10/10/2016  ?  7:08 AM 04/08/2016  ?  6:33 AM 07/12/2015  ?  9:58 PM  ?Advanced Directives  ?Does Patient Have a Medical Advance Directive? Yes Yes No;Yes Yes Yes Yes Yes  ?Type of AParamedicof [place]on[place]Living will [place]chool[place]Living will [place]Living will Living will Living will Living will Living will  ?Does patient want to make changes to medical advance directive? No - Patient declined No - Patient declined    No - Patient declined   ?Copy of HHowein Chart? No - copy requested No - copy requested        ?Would patient like information on creating a medical advance directive?    No - Patient declined     ? ? ?Current Medications (verified) ?Outpatient Encounter Medications as of 07/23/2021  ?Medication Sig  ? acetaminophen (TYLENOL) 500 MG tablet Take 1,000 mg by mouth every 6 (six) hours as needed for moderate pain or headache.  ? amLODipine (NORVASC) 2.5 MG tablet TAKE ONE TABLET EVERY DAY  ? carvedilol (COREG) 12.5 MG tablet TAKE ONE TABLET TWICE DAILY WITH MEALS  ? Cholecalciferol (VITAMIN D3) 2000 units capsule Take 2,000 Units by mouth daily.  ? enalapril (VASOTEC) 10 MG tablet TAKE ONE TABLET EVERY DAY  ? escitalopram (LEXAPRO) 20 MG tablet TAKE 1 TABLET BY MOUTH DAILY FOR ANXIETY  ? famotidine (PEPCID) 40 MG tablet Take 1 tablet (40 mg total) by mouth at bedtime.  ? meloxicam (MOBIC) 15 MG tablet meloxicam 15 mg tablet  ? metroNIDAZOLE (METROCREAM) 0.75 % cream Apply topically daily as needed (rash).  ? Multiple Vitamin (MULTIVITAMIN) capsule Take 1 capsule by mouth daily.  ? nitroGLYCERIN (NITROSTAT) 0.4 MG SL tablet Place 1 tablet (0.4 mg total) under the tongue every 5 (five) minutes as needed for chest pain.  ? pantoprazole (PROTONIX) 40 MG tablet TAKE 1 TABLET BY MOUTH TWICE DAILY AS NEEDED FOR HEARTBURN  ? Polyvinyl Alcohol-Povidone (REFRESH OP) Place 1 drop into both eyes daily as needed (dry eyes). Eye drops w Omega three oil ingredients  ?  rosuvastatin (CRESTOR) 20 MG tablet TAKE ONE TABLET EVERY DAY FOR CHOLESTEROL  ? zolpidem (AMBIEN) 10 MG tablet TAKE ONE TABLET AT BEDTIME IF NEEDED FORSLEEP  ? ?No facility-administered encounter medications on file as of 07/23/2021.  ? ? ?Allergies (verified) ?Penicillin g, Penicillins, Sulfa antibiotics, Sulfasalazine, Cefaclor, Cefuroxime axetil, Clindamycin, Clindamycin/lincomycin, Lansoprazole, Latex, Lincomycin, Lincomycin hcl, and Sulfamethoxazole-trimethoprim  ? ?History: ?Past Medical History:  ?Diagnosis Date  ? Anxiety   ? when husband passed  ?  Arthritis   ? generalized  ? Cardiomyopathy   ? nonischemic  ? Cataract   ? bilateral sx  ? CHF (congestive heart failure) ([place])   ? Chronic back pain   ? Complication of anesthesia   ? Erosive esophagitis   ? LA Classification Grade B  ? Essential hypertension   ? on meds  ? GERD (gastroesophageal reflux disease)   ? Hiatal hernia   ? Hyperlipidemia   ? on meds  ? Insomnia   ? MDD (major depressive disorder)   ? situational - when husband passed  ? Melanoma ([place])   ? basil cell, pt reports no melanoma  ? Migraine   ? Osteopenia   ? PONV (postoperative nausea and vomiting)   ? Rosacea   ? Sessile colonic polyp 10/2011  ? adenoma  ? Squamous carcinoma   ? scalp  ? ?Past Surgical History:  ?Procedure Laterality Date  ? APPENDECTOMY    ? CARDIAC CATHETERIZATION    ? CATARACT EXTRACTION W/PHACO Right 03/18/2016  ? Procedure: CATARACT EXTRACTION PHACO AND INTRAOCULAR LENS PLACEMENT (IOC);  Surgeon: Birder [name], MD;  Location: [facility];  Service: Ophthalmology;  Laterality: Right;  [place] 45.3 ?AP% 22.2 ?CDE 10.07 ?Fluid pack lot # [id number]  ? CATARACT EXTRACTION W/PHACO Left 04/08/2016  ? Procedure: CATARACT EXTRACTION PHACO AND INTRAOCULAR LENS PLACEMENT (IOC);  Surgeon: Birder [name], MD;  Location: [facility];  Service: Ophthalmology;  Laterality: Left;  [place] 00:41 ?AP% 15.0 ?CDE 6.27 ?Fluid pack lot # [id number]  ? LOOP RECORDER INSERTION N/A 10/10/2016  ? Procedure: Loop Recorder Insertion;  Surgeon: Evans Lance, MD;  Location: [place] [facility];  Service: Cardiovascular;  Laterality: N/A;  ? LOOP RECORDER REMOVAL  07/2020  ? SKIN CANCER EXCISION    ? scalp  ? TOTAL KNEE ARTHROPLASTY Left 03/12/2021  ? Procedure: TOTAL KNEE ARTHROPLASTY;  Surgeon: Paralee Cancel, MD;  Location: [facility];  Service: Orthopedics;  Laterality: Left;  ? TUBAL LIGATION    ? VAGINAL HYSTERECTOMY    ? WISDOM TOOTH EXTRACTION    ? ?Family History  ?Problem Relation Age of Onset  ? Alzheimer's disease Mother   ? Heart disease Father   ?  Arthritis/Rheumatoid Sister   ? Colon cancer Neg Hx   ? Stomach cancer Neg Hx   ? Rectal cancer Neg Hx   ? Esophageal cancer Neg Hx   ? Colon polyps Neg Hx   ? ?Social History  ? ?Socioeconomic History  ? Marital status: Widowed  ?  Spouse name: Not on file  ? Number of children: 3  ? Years of education: 39  ? Highest education level: Not on file  ?Occupational History  ? Not on file  ?Tobacco Use  ? Smoking status: Former  ?  Types: Cigarettes  ?  Quit date: 06/03/1981  ?  Years since quitting: 40.1  ? Smokeless tobacco: Never  ?Vaping Use  ? Vaping Use: Never used  ?Substance and Sexual Activity  ? Alcohol use: Yes  ?  Alcohol/week: 4.0 standard drinks  ?  Types: 4 Glasses of wine per week  ?  Comment: Wine per night  ? Drug use: No  ? Sexual activity: Not Currently  ?Other Topics Concern  ? Not on file  ?Social History Narrative  ? Lives alone in two-story home.    ? Her husband passed away unexpectedly.    ? They have three children- one is a pediatrician.  ? Desires CPR.  ? Caffeine use: Decaf coffee  ? Right handed  ? ?Social Determinants of Health  ? ?Financial Resource Strain: Low Risk   ? Difficulty of Paying Living Expenses: Not hard at all  ?Food Insecurity: No Food Insecurity  ? Worried About Charity fundraiser in the Last Year: Never true  ? Ran Out of Food in the Last Year: Never true  ?Transportation Needs: No Transportation Needs  ? Lack of Transportation (Medical): No  ? Lack of Transportation (Non-Medical): No  ?Physical Activity: Sufficiently Active  ? Days of Exercise per Week: 5 days  ? Minutes of Exercise per Session: 30 min  ?Stress: No Stress Concern Present  ? Feeling of Stress : Not at all  ?Social Connections: Unknown  ? Frequency of Communication with Friends and Family: More than three times a week  ? Frequency of Social Gatherings with Friends and Family: More than three times a week  ? Attends Religious Services: More than 4 times per year  ? Active Member of Clubs or Organizations:  Yes  ? Attends Archivist Meetings: 1 to 4 times per year  ? Marital Status: Not on file  ? ? ? ?Clinical Intake: ? ? ?Diabetic?  No ? ?Interpreter Needed?: NoActivities of Daily Living ? ?  07/23/2021  ?

## 2021-08-01 ENCOUNTER — Other Ambulatory Visit: Payer: Self-pay | Admitting: Internal Medicine

## 2021-08-06 ENCOUNTER — Other Ambulatory Visit: Payer: Self-pay | Admitting: Gastroenterology

## 2021-08-06 DIAGNOSIS — K2100 Gastro-esophageal reflux disease with esophagitis, without bleeding: Secondary | ICD-10-CM

## 2021-08-06 DIAGNOSIS — K229 Disease of esophagus, unspecified: Secondary | ICD-10-CM

## 2021-08-09 ENCOUNTER — Telehealth: Payer: Self-pay | Admitting: Gastroenterology

## 2021-08-09 DIAGNOSIS — K2100 Gastro-esophageal reflux disease with esophagitis, without bleeding: Secondary | ICD-10-CM

## 2021-08-09 DIAGNOSIS — K229 Disease of esophagus, unspecified: Secondary | ICD-10-CM

## 2021-08-09 MED ORDER — PANTOPRAZOLE SODIUM 40 MG PO TBEC: DELAYED_RELEASE_TABLET | ORAL | 0 refills | Status: AC

## 2021-08-09 MED ORDER — FAMOTIDINE 40 MG PO TABS: 40.0000 mg | ORAL_TABLET | Freq: Every day | ORAL | 0 refills | Status: AC

## 2021-08-09 NOTE — Telephone Encounter (Signed)
Patient called requesting a refill on Pepcid and Protonix said the pharmacy advised her she needs to be seen however she said she will be out of town and would need her medication. Please advise.

## 2021-08-09 NOTE — Telephone Encounter (Signed)
Prescriptions sent to patient's pharmacy. Patient is due for f/u in August.

## 2021-08-10 ENCOUNTER — Other Ambulatory Visit: Payer: Self-pay | Admitting: Internal Medicine

## 2021-08-26 ENCOUNTER — Telehealth: Payer: Self-pay | Admitting: Internal Medicine

## 2021-08-26 NOTE — Telephone Encounter (Signed)
Spoke with patient and discussed swelling to ankles.  Patient reports she noticed swelling to ankles starting on Thursday 08/22/21. She denies SOB or change in diet (no increased salt). Patient reports she is elevating her feet daily.  Patient has an appt with Dr. Harrington Challenger on 08/27/21 and will discuss swelling at this time.

## 2021-08-26 NOTE — Telephone Encounter (Signed)
Pt c/o swelling: STAT is pt has developed SOB within 24 hours  If swelling, where is the swelling located? Ankles   How much weight have you gained and in what time span? Doesn't have scale to weigh   Have you gained 3 pounds in a day or 5 pounds in a week? Unsure  Do you have a log of your daily weights (if so, list)? No   Are you currently taking a fluid pill? No   Are you currently SOB? No   Have you traveled recently? Yes    Patient states the swelling has been present the past three days to the point her ankles were hanging over her shoes. She has been scheduled to see Dr. Harrington Challenger tomorrow, 06/20 regarding this.

## 2021-08-26 NOTE — Progress Notes (Unsigned)
Cardiology Office Note   Date:  08/27/2021   ID:  [name], DOB 1945-06-03, MRN [id number]  PCP:  [name], NP  Cardiologist:   Dorris Carnes, MD   Pt presents for f/u of CP   History of Present Illness: [name] is a 76 y.o. female with a history of NICM (LVEF 50 to 55% in 2011), LBBB, CP  Normal myovue in 2014  She also had a history of near syncope and chest tightness in May 2018 She has been seen by Beckie Salts and had ILR placed   No arrhythmia demonstrated  ILR removed     The pt denies CP    She recently went to [place] there 08/17/21   On 6/15//23 developed bilateral LE edema    No OB      Got back home  Edema gone yesterday       the pt says she was eating about the same kind of foods      Breathing is OK   no palpitations   No dizziness  Biggest complaint is L knee pain after replacement       Current Meds  Medication Sig   acetaminophen (TYLENOL) 500 MG tablet Take 1,000 mg by mouth every 6 (six) hours as needed for moderate pain or headache.   amLODipine (NORVASC) 2.5 MG tablet TAKE ONE TABLET EVERY DAY   carvedilol (COREG) 12.5 MG tablet TAKE ONE TABLET TWICE DAILY WITH MEALS   Cholecalciferol (VITAMIN D3) 2000 units capsule Take 2,000 Units by mouth daily.   enalapril (VASOTEC) 10 MG tablet TAKE ONE TABLET EVERY DAY   escitalopram (LEXAPRO) 20 MG tablet TAKE 1 TABLET BY MOUTH DAILY FOR ANXIETY   famotidine (PEPCID) 40 MG tablet Take 1 tablet (40 mg total) by mouth at bedtime.   meloxicam (MOBIC) 15 MG tablet meloxicam 15 mg tablet   metroNIDAZOLE (METROCREAM) 0.75 % cream Apply topically daily as needed (rash).   Multiple Vitamin (MULTIVITAMIN) capsule Take 1 capsule by mouth daily.   nitroGLYCERIN (NITROSTAT) 0.4 MG SL tablet Place 1 tablet (0.4 mg total) under the tongue every 5 (five) minutes as needed for chest pain.   pantoprazole (PROTONIX) 40 MG tablet TAKE 1 TABLET BY MOUTH TWICE DAILY AS NEEDED FOR HEARTBURN   Polyvinyl Alcohol-Povidone  (REFRESH OP) Place 1 drop into both eyes daily as needed (dry eyes). Eye drops w Omega three oil ingredients   rosuvastatin (CRESTOR) 20 MG tablet TAKE ONE TABLET EVERY DAY FOR CHOLESTEROL   traMADol (ULTRAM) 50 MG tablet Take 50 mg by mouth as needed for pain.   zolpidem (AMBIEN) 10 MG tablet TAKE ONE TABLET AT BEDTIME IF NEEDED FORSLEEP     Allergies:   Penicillin g, Penicillins, Sulfa antibiotics, Sulfasalazine, Cefaclor, Cefuroxime axetil, Clindamycin, Clindamycin/lincomycin, Lansoprazole, Latex, Lincomycin, Lincomycin hcl, and Sulfamethoxazole-trimethoprim   Past Medical History:  Diagnosis Date   Anxiety    when husband passed   Arthritis    generalized   Cardiomyopathy    nonischemic   Cataract    bilateral sx   CHF (congestive heart failure) (HCC)    Chronic back pain    Complication of anesthesia    Erosive esophagitis    LA Classification Grade B   Essential hypertension    on meds   GERD (gastroesophageal reflux disease)    Hiatal hernia    Hyperlipidemia    on meds   Insomnia    MDD (major depressive disorder)  situational - when husband passed   Melanoma ([place])    basil cell, pt reports no melanoma   Migraine    Osteopenia    PONV (postoperative nausea and vomiting)    Rosacea    Sessile colonic polyp 10/2011   adenoma   Squamous carcinoma    scalp    Past Surgical History:  Procedure Laterality Date   APPENDECTOMY     CARDIAC CATHETERIZATION     CATARACT EXTRACTION W/PHACO Right 03/18/2016   Procedure: CATARACT EXTRACTION PHACO AND INTRAOCULAR LENS PLACEMENT ([place]);  Surgeon: Birder [name], MD;  Location: [facility];  Service: Ophthalmology;  Laterality: Right;  [place] 45.3 AP% 22.2 CDE 10.07 Fluid pack lot # [id number]   CATARACT EXTRACTION W/PHACO Left 04/08/2016   Procedure: CATARACT EXTRACTION PHACO AND INTRAOCULAR LENS PLACEMENT ([facility]);  Surgeon: Birder [name], MD;  Location: [facility];  Service: Ophthalmology;  Laterality: Left;  [place] 00:41 AP%  15.0 CDE 6.27 Fluid pack lot # [id number]   LOOP RECORDER INSERTION N/A 10/10/2016   Procedure: Loop Recorder Insertion;  Surgeon: Evans Lance, MD;  Location: [facility];  Service: Cardiovascular;  Laterality: N/A;   LOOP RECORDER REMOVAL  07/2020   SKIN CANCER EXCISION     scalp   TOTAL KNEE ARTHROPLASTY Left 03/12/2021   Procedure: TOTAL KNEE ARTHROPLASTY;  Surgeon: Paralee Cancel, MD;  Location: [facility];  Service: Orthopedics;  Laterality: Left;   TUBAL LIGATION     VAGINAL HYSTERECTOMY     WISDOM TOOTH EXTRACTION       Social History:  The patient  reports that she quit smoking about 40 years ago. Her smoking use included cigarettes. She has never used smokeless tobacco. She reports current alcohol use of about 4.0 standard drinks of alcohol per week. She reports that she does not use drugs.   Family History:  The patient's family history includes Alzheimer's disease in her mother; Arthritis/Rheumatoid in her sister; Heart disease in her father.    ROS:  Please see the history of present illness. All other systems are reviewed and  Negative to the above problem except as noted.    PHYSICAL EXAM: VS:  BP 104/60   Pulse 60   Ht '5\' 2"'$  (1.575 m)   Wt 127 lb (57.6 kg)   SpO2 96%   BMI 23.23 kg/m   GEN: Pt is a 76 yo female  in no acute distress  HEENT: normal Neck: JVP is not elevated   No carotid bruits Cardiac: RRR; no murmurs  No LE edema  Respiratory:  clear to auscultation bilaterally,  GI: soft, nontender, nondistended, + BS  No hepatomegaly  MS:  Moving all extremities   Skin: warm and dry    EKG:  EKG is ordered today.     SR 60   LBBB  Lipid Panel    Component Value Date/Time   CHOL 129 05/01/2021 1018   CHOL 186 05/04/2017 1024   TRIG 203.0 (H) 05/01/2021 1018   HDL 48.50 05/01/2021 1018   HDL 44 05/04/2017 1024   CHOLHDL 3 05/01/2021 1018   VLDL 40.6 (H) 05/01/2021 1018   LDLCALC 41 11/21/2019 0808   LDLCALC 69 05/04/2017 1024   LDLDIRECT 43.0  05/01/2021 1018      Wt Readings from Last 3 Encounters:  08/27/21 127 lb (57.6 kg)  07/23/21 130 lb (59 kg)  07/03/21 130 lb 6 oz (59.1 kg)    Echo 06/08/19 1. Left ventricular ejection fraction, by estimation, is 50  to 55%. The left ventricle has low normal function. The left ventricle has no regional wall motion abnormalities. There is mild left ventricular hypertrophy of the basal and septal segments. Left ventricular diastolic parameters are consistent with Grade I diastolic dysfunction (impaired relaxation). 2. Right ventricular systolic function is normal. The right ventricular size is normal. Tricuspid regurgitation signal is inadequate for assessing PA pressure. 3. Left atrial size was mildly dilated. 4. The mitral valve is normal in structure. Trivial mitral valve regurgitation. 5. The aortic valve is normal in structure. Aortic valve regurgitation is not visualized. Mild aortic valve sclerosis is present, with no evidence of aortic valve stenosis. 6. The inferior vena cava is normal in size with greater than 50% respiratory variability, suggesting right atrial pressure of 3 mmHg.  ASSESSMENT AND PLAN:  1  Hx NICM    Last echo LVEF normal   Keep on current regimen  LE edema resolved on own   ? If related to diet changes and travel     Get labs  (bmet, BNP, CBC)  Keep on current regimen  If recurs will set up for repeat echo     2   Hx CP   Denies   3   Presyncope   No recurrence  4  HL   Keep on Crestor  Last LDL 43    5   LBBB  Not new     F/U next year  (9 months)  Current medicines are reviewed at length with the patient today.  The patient does not have concerns regarding medicines.  Signed, Dorris Carnes, MD  08/27/2021 11:20 AM    [address], [place], [place]  [address] Phone: [phone]; Fax: [phone]

## 2021-08-27 ENCOUNTER — Ambulatory Visit: Payer: Medicare HMO | Admitting: Internal Medicine

## 2021-08-27 ENCOUNTER — Encounter: Payer: Self-pay | Admitting: Internal Medicine

## 2021-08-27 VITALS — BP 104/60 | HR 60 | Ht 62.0 in | Wt 127.0 lb

## 2021-08-27 DIAGNOSIS — I429 Cardiomyopathy, unspecified: Secondary | ICD-10-CM | POA: Diagnosis not present

## 2021-08-27 DIAGNOSIS — E7800 Pure hypercholesterolemia, unspecified: Secondary | ICD-10-CM

## 2021-08-27 MED ORDER — CARVEDILOL 12.5 MG PO TABS: ORAL_TABLET | ORAL | 3 refills | Status: AC

## 2021-08-27 MED ORDER — AMLODIPINE BESYLATE 2.5 MG PO TABS: 2.5000 mg | ORAL_TABLET | Freq: Every day | ORAL | 3 refills | Status: DC

## 2021-08-27 MED ORDER — ENALAPRIL MALEATE 10 MG PO TABS: 10.0000 mg | ORAL_TABLET | Freq: Every day | ORAL | 3 refills | Status: AC

## 2021-08-27 MED ORDER — NITROGLYCERIN 0.4 MG SL SUBL: 0.4000 mg | SUBLINGUAL_TABLET | SUBLINGUAL | 3 refills | Status: AC | PRN

## 2021-08-27 MED ORDER — ROSUVASTATIN CALCIUM 20 MG PO TABS: 20.0000 mg | ORAL_TABLET | Freq: Every day | ORAL | 3 refills | Status: DC

## 2021-08-27 NOTE — Patient Instructions (Addendum)
Medication Instructions:  Your physician recommends that you continue on your current medications as directed. Please refer to the Current Medication list given to you today. *If you need a refill on your cardiac medications before your next appointment, please call your pharmacy*   Lab Work: TODAY-BMET, CBC, BNP If you have labs (blood work) drawn today and your tests are completely normal, you will receive your results only by: [place] (if you have MyChart) OR A paper copy in the mail If you have any lab test that is abnormal or we need to change your treatment, we will call you to review the results.   Testing/Procedures: None Ordered   Follow-Up: At [facility], you and your health needs are our priority.  As part of our continuing mission to provide you with exceptional heart care, we have created designated Provider Care Teams.  These Care Teams include your primary Cardiologist (physician) and Advanced Practice Providers (APPs -  Physician Assistants and Nurse Practitioners) who all work together to provide you with the care you need, when you need it.  We recommend signing up for the patient portal called "MyChart".  Sign up information is provided on this After Visit Summary.  MyChart is used to connect with patients for Virtual Visits (Telemedicine).  Patients are able to view lab/test results, encounter notes, upcoming appointments, etc.  Non-urgent messages can be sent to your provider as well.   To learn more about what you can do with MyChart, go to [email or web address].    Your next appointment:   9 month(s)  The format for your next appointment:   In Person  Provider:   Dorris Carnes, MD     Other Instructions   Important Information About Sugar

## 2021-08-28 LAB — CBC
Hematocrit: 36.4 % (ref 34.0–46.6)
Hemoglobin: 12.1 g/dL (ref 11.1–15.9)
MCH: 29.3 pg (ref 26.6–33.0)
MCHC: 33.2 g/dL (ref 31.5–35.7)
MCV: 88 fL (ref 79–97)
Platelets: 246 10*3/uL (ref 150–450)
RBC: 4.13 x10E6/uL (ref 3.77–5.28)
RDW: 11.9 % (ref 11.7–15.4)
WBC: 6.2 10*3/uL (ref 3.4–10.8)

## 2021-08-28 LAB — BASIC METABOLIC PANEL
BUN/Creatinine Ratio: 11 — ABNORMAL LOW (ref 12–28)
BUN: 7 mg/dL — ABNORMAL LOW (ref 8–27)
CO2: 25 mmol/L (ref 20–29)
Calcium: 9.4 mg/dL (ref 8.7–10.3)
Chloride: 106 mmol/L (ref 96–106)
Creatinine, Ser: 0.63 mg/dL (ref 0.57–1.00)
Glucose: 95 mg/dL (ref 70–99)
Potassium: 3.9 mmol/L (ref 3.5–5.2)
Sodium: 145 mmol/L — ABNORMAL HIGH (ref 134–144)
eGFR: 91 mL/min/{1.73_m2} (ref >59)

## 2021-08-28 LAB — PRO B NATRIURETIC PEPTIDE: NT-Pro BNP: 269 pg/mL (ref 0–738)

## 2021-08-29 DIAGNOSIS — D0471 Carcinoma in situ of skin of right lower limb, including hip: Secondary | ICD-10-CM | POA: Diagnosis not present

## 2021-09-12 ENCOUNTER — Other Ambulatory Visit: Payer: Self-pay | Admitting: Primary Care

## 2021-09-12 DIAGNOSIS — G4700 Insomnia, unspecified: Secondary | ICD-10-CM

## 2021-09-20 DIAGNOSIS — M25562 Pain in left knee: Secondary | ICD-10-CM | POA: Diagnosis not present

## 2021-10-03 DIAGNOSIS — D045 Carcinoma in situ of skin of trunk: Secondary | ICD-10-CM | POA: Diagnosis not present

## 2021-10-03 DIAGNOSIS — D485 Neoplasm of uncertain behavior of skin: Secondary | ICD-10-CM | POA: Diagnosis not present

## 2021-10-03 DIAGNOSIS — L821 Other seborrheic keratosis: Secondary | ICD-10-CM | POA: Diagnosis not present

## 2021-10-03 DIAGNOSIS — L570 Actinic keratosis: Secondary | ICD-10-CM | POA: Diagnosis not present

## 2021-10-21 ENCOUNTER — Telehealth: Payer: Self-pay | Admitting: Primary Care

## 2021-10-21 NOTE — Telephone Encounter (Signed)
Order was faxed this morning with conformation received.

## 2021-10-21 NOTE — Telephone Encounter (Signed)
Patient called in and stated the [facility] is needing bone density referral to be sent over to fax: [phone]. Stated she has an appointment tomorrow. Thank you!

## 2021-10-22 DIAGNOSIS — Z1231 Encounter for screening mammogram for malignant neoplasm of breast: Secondary | ICD-10-CM | POA: Diagnosis not present

## 2021-10-22 DIAGNOSIS — M85851 Other specified disorders of bone density and structure, right thigh: Secondary | ICD-10-CM | POA: Diagnosis not present

## 2021-10-22 DIAGNOSIS — Z780 Asymptomatic menopausal state: Secondary | ICD-10-CM | POA: Diagnosis not present

## 2021-10-22 LAB — HM DEXA SCAN

## 2021-10-22 LAB — HM MAMMOGRAPHY

## 2021-10-25 ENCOUNTER — Encounter: Payer: Self-pay | Admitting: Primary Care

## 2021-10-30 DIAGNOSIS — D044 Carcinoma in situ of skin of scalp and neck: Secondary | ICD-10-CM | POA: Diagnosis not present

## 2021-10-30 DIAGNOSIS — D0439 Carcinoma in situ of skin of other parts of face: Secondary | ICD-10-CM | POA: Diagnosis not present

## 2021-10-30 DIAGNOSIS — D485 Neoplasm of uncertain behavior of skin: Secondary | ICD-10-CM | POA: Diagnosis not present

## 2021-10-30 DIAGNOSIS — L570 Actinic keratosis: Secondary | ICD-10-CM | POA: Diagnosis not present

## 2021-11-19 IMAGING — CR DG CHEST 2V
2 series · 2 of 2 positions shown · non-contrast
Comparison: CT 10/10/2009

CLINICAL DATA: Chest pain

EXAM:
CHEST - 2 VIEW

[chest pa]
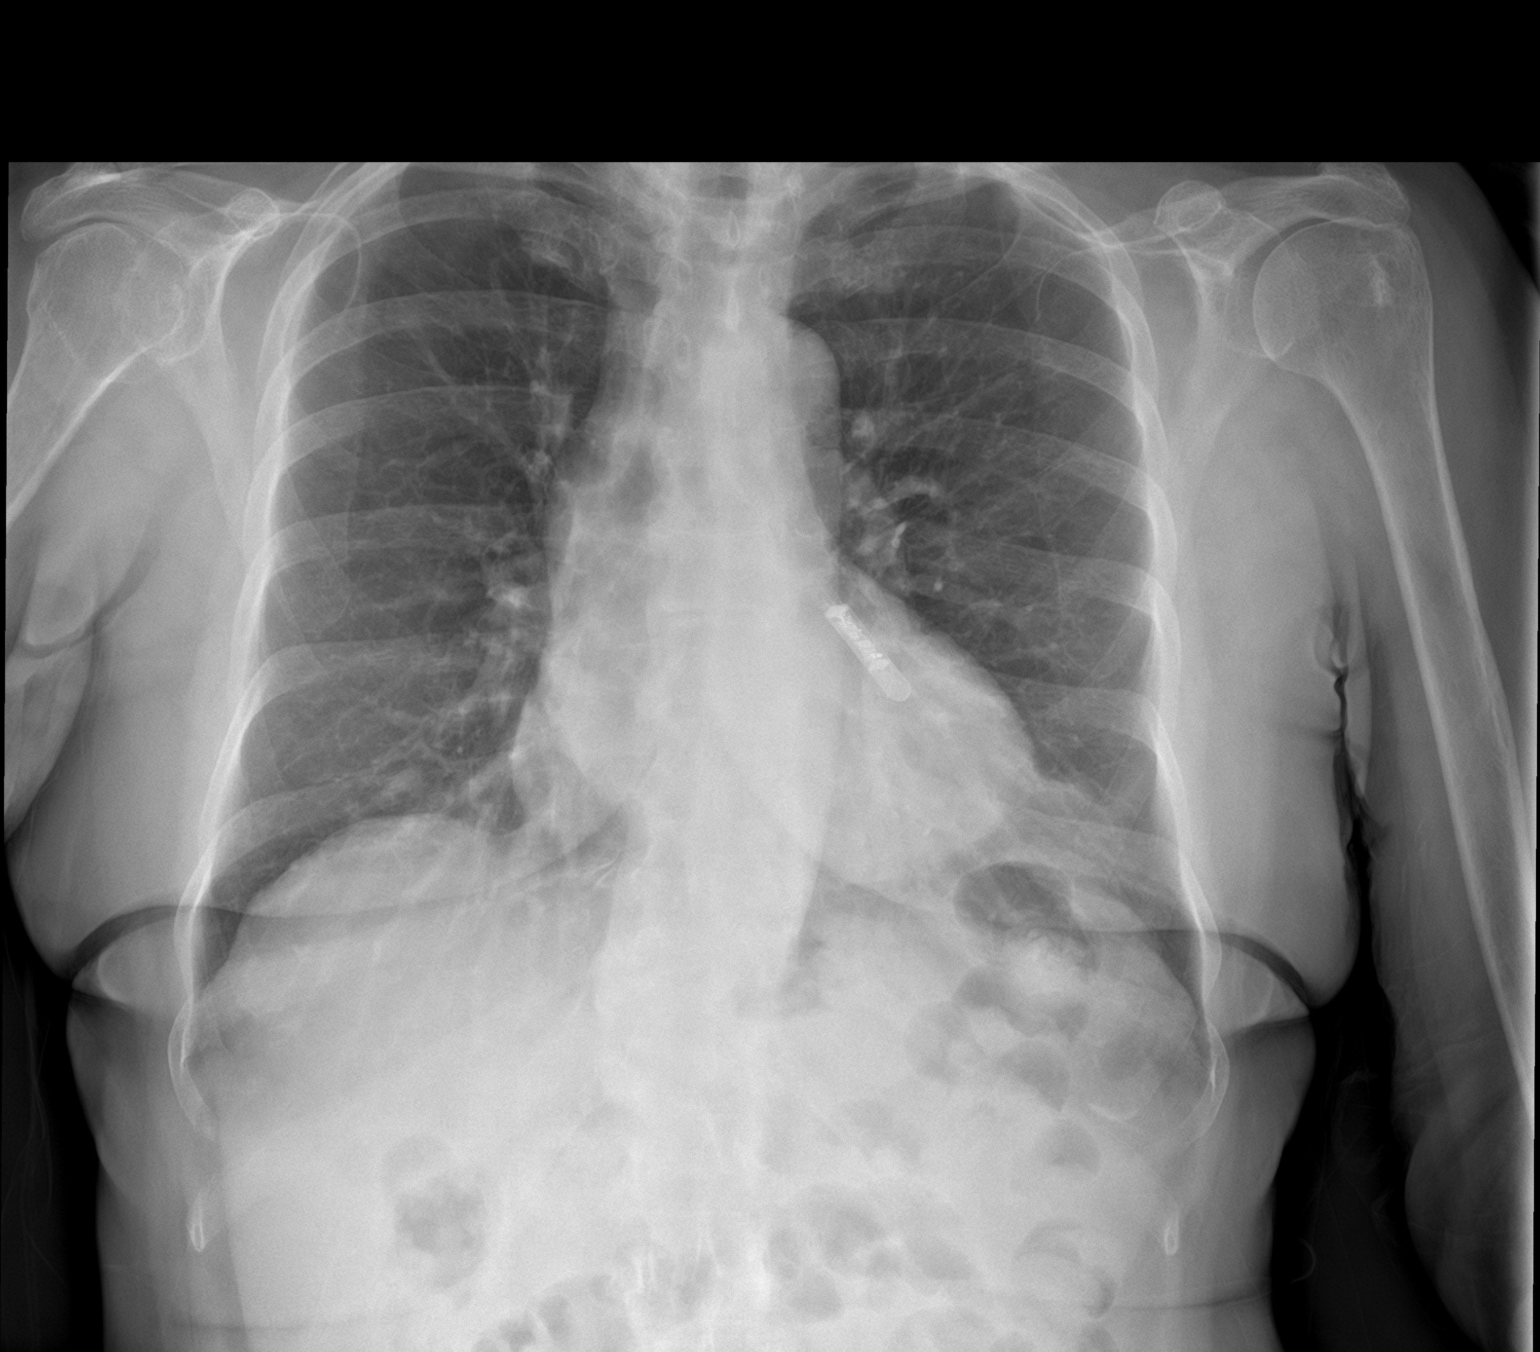

[chest lat]
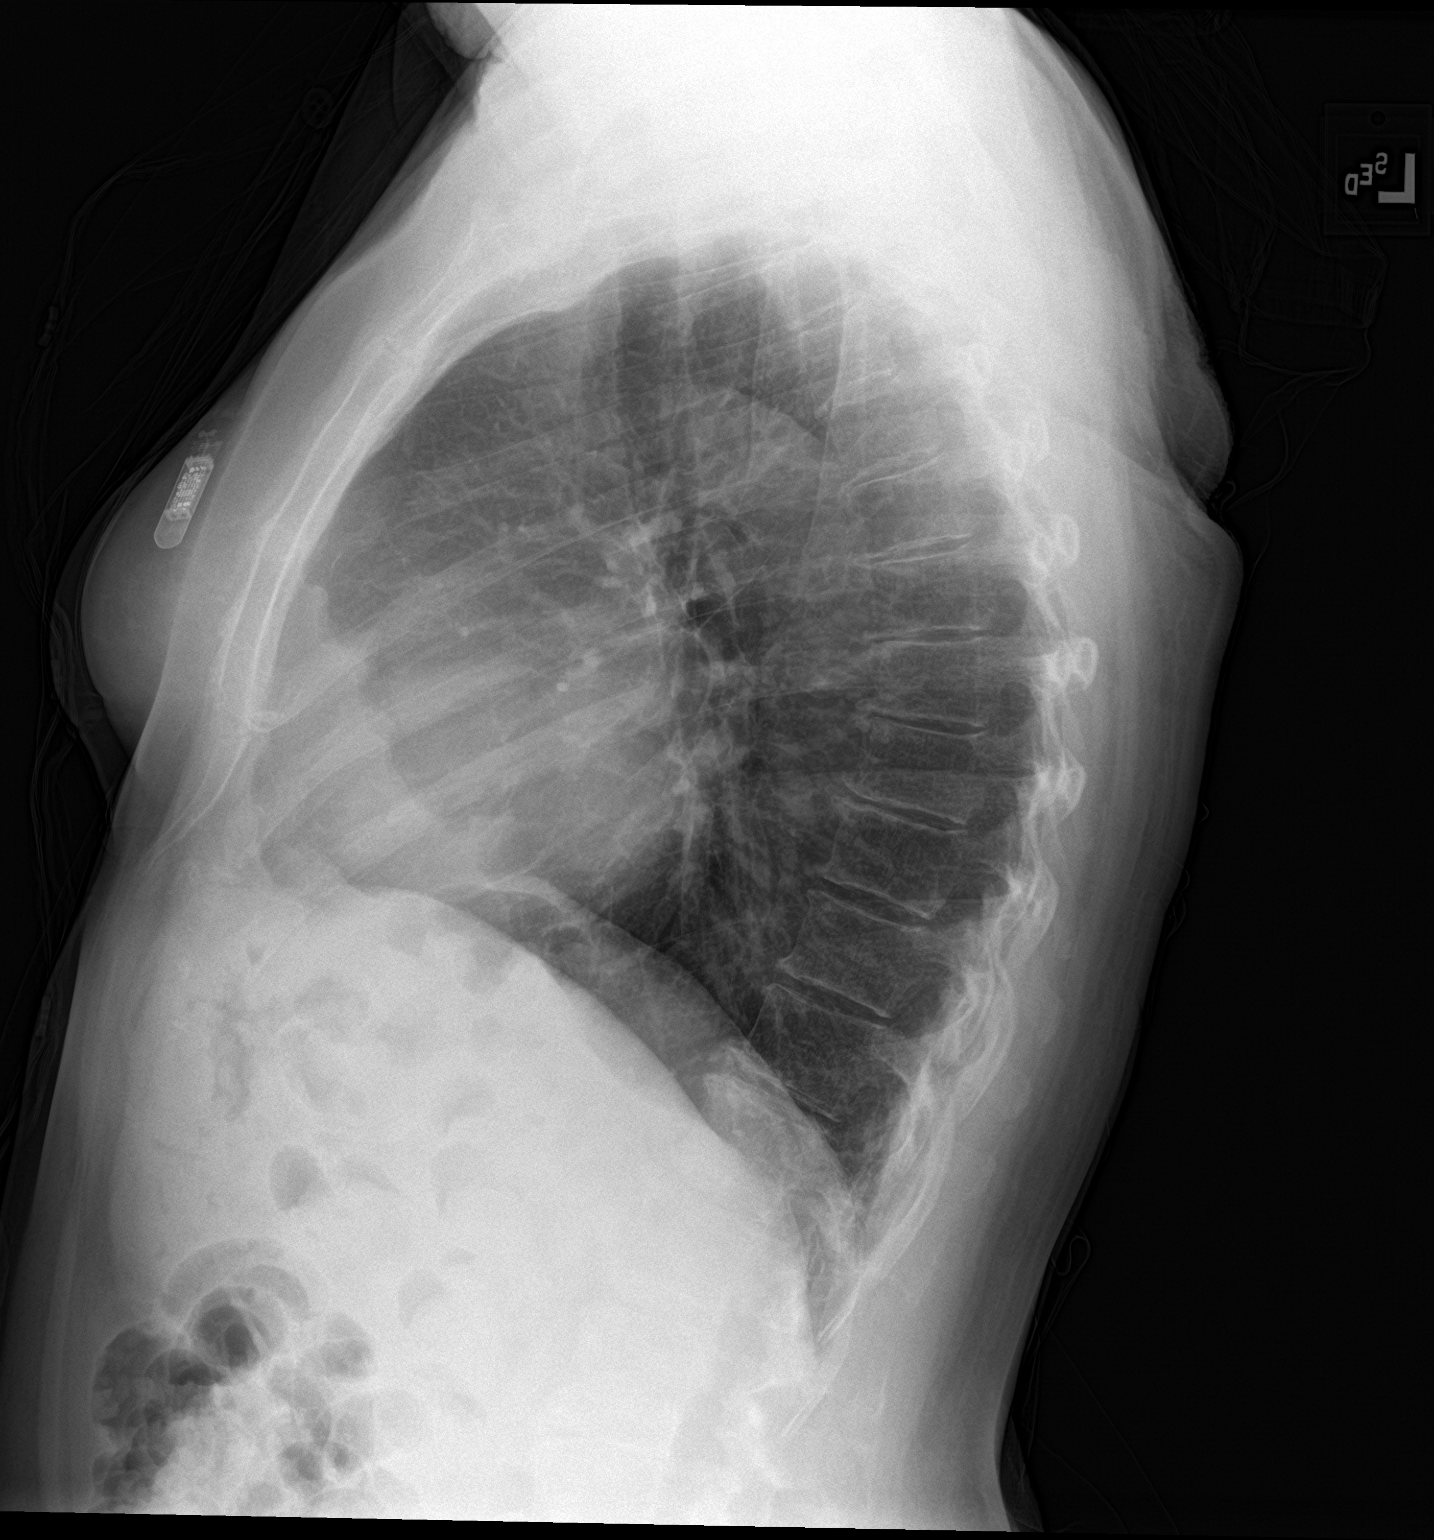

[2 of 2 positions shown; findings below may reference images not displayed]

FINDINGS: No focal opacity or pleural effusion. Normal cardiomediastinal
silhouette. Electronic recording device over the left chest. No
pneumothorax.
IMPRESSION: No active cardiopulmonary disease.

## 2021-12-02 ENCOUNTER — Telehealth: Payer: Self-pay | Admitting: Primary Care

## 2021-12-02 NOTE — Telephone Encounter (Signed)
Pt stated she tested + for covid, this morning, 12/02/21. Pt recently moved to [facility] about 2 weeks ago, haven't gotten est with a provider there yet. Pt was wondering if pcp would call in some meds or does she need a mychart visit. Preferred pharmacy [facility], [address], [place]. Call back # [phone].

## 2021-12-02 NOTE — Telephone Encounter (Signed)
Called patient set up appointment with [facility] for tomorrow. Reviewed all red words with patient. She will go to ED or urgent care if any. Offered virtual with patient today she declined.

## 2021-12-03 ENCOUNTER — Telehealth (INDEPENDENT_AMBULATORY_CARE_PROVIDER_SITE_OTHER): Payer: Medicare HMO | Admitting: Nurse Practitioner

## 2021-12-03 VITALS — Temp 99.8°F

## 2021-12-03 DIAGNOSIS — U071 COVID-19: Secondary | ICD-10-CM | POA: Diagnosis not present

## 2021-12-03 MED ORDER — NIRMATRELVIR/RITONAVIR (PAXLOVID)TABLET: 3.0000 | ORAL_TABLET | Freq: Two times a day (BID) | ORAL | 0 refills | Status: DC

## 2021-12-03 MED ORDER — NIRMATRELVIR/RITONAVIR (PAXLOVID)TABLET: 3.0000 | ORAL_TABLET | Freq: Two times a day (BID) | ORAL | 0 refills | Status: AC

## 2021-12-03 NOTE — Progress Notes (Signed)
Patient ID: HAASINI PATNAUDE, female    DOB: [birth year], 76 y.o.   MRN: [id number]  Virtual visit completed through [place], a video enabled telemedicine application. Due to national recommendations of social distancing due to COVID-19, a virtual visit is felt to be most appropriate for this patient at this time. Reviewed limitations, risks, security and privacy concerns of performing a virtual visit and the availability of in person appointments. I also reviewed that there may be a patient responsible charge related to this service. The patient agreed to proceed.   Patient location: home Provider location: [place] [facility], office Persons participating in this virtual visit: patient, provider   If any vitals were documented, they were collected by patient at home unless specified below.    Temp 99.8 F (37.7 C) Comment: per patient, this i with taking tylenol   CC: Covid 19 Subjective:   HPI: [name] is a 76 y.o. female presenting on 12/03/2021 for Covid Positive (On 12/02/21, sx started on 12/01/21-had scratchy sore throat, then had headache, bad sore throat, congestion.)    Symptoms started on 12/01/2021 Covid positive on 12/02/2021 Pfizer vaccines x2 and a booster No sick contacts Has been taking tylenol and throat lozenges, with minimal relief   Relevant past medical, surgical, family and social history reviewed and updated as indicated. Interim medical history since our last visit reviewed. Allergies and medications reviewed and updated. Outpatient Medications Prior to Visit  Medication Sig Dispense Refill   acetaminophen (TYLENOL) 500 MG tablet Take 1,000 mg by mouth every 6 (six) hours as needed for moderate pain or headache.     amLODipine (NORVASC) 2.5 MG tablet Take 1 tablet (2.5 mg total) by mouth daily. 90 tablet 3   carvedilol (COREG) 12.5 MG tablet TAKE ONE TABLET TWICE DAILY WITH MEALS 180 tablet 3   Cholecalciferol (VITAMIN D3) 2000 units capsule  Take 2,000 Units by mouth daily.     diclofenac (VOLTAREN) 75 MG EC tablet Take 1 tablet by mouth daily.     enalapril (VASOTEC) 10 MG tablet Take 1 tablet (10 mg total) by mouth daily. 90 tablet 3   escitalopram (LEXAPRO) 20 MG tablet TAKE 1 TABLET BY MOUTH DAILY FOR ANXIETY 90 tablet 3   famotidine (PEPCID) 40 MG tablet Take 1 tablet (40 mg total) by mouth at bedtime. 90 tablet 0   meloxicam (MOBIC) 15 MG tablet meloxicam 15 mg tablet     metroNIDAZOLE (METROCREAM) 0.75 % cream Apply topically daily as needed (rash).     Multiple Vitamin (MULTIVITAMIN) capsule Take 1 capsule by mouth daily.     pantoprazole (PROTONIX) 40 MG tablet TAKE 1 TABLET BY MOUTH TWICE DAILY AS NEEDED FOR HEARTBURN (Patient taking differently: TAKE 1 TABLET BY MOUTH TWICE DAILY  FOR HEARTBURN) 180 tablet 0   Polyvinyl Alcohol-Povidone (REFRESH OP) Place 1 drop into both eyes daily as needed (dry eyes). Eye drops w Omega three oil ingredients     rosuvastatin (CRESTOR) 20 MG tablet Take 1 tablet (20 mg total) by mouth daily. 90 tablet 3   traMADol (ULTRAM) 50 MG tablet Take 50 mg by mouth as needed for pain.     zolpidem (AMBIEN) 10 MG tablet TAKE ONE TABLET AT BEDTIME IF NEEDED FORSLEEP 90 tablet 0   nitroGLYCERIN (NITROSTAT) 0.4 MG SL tablet Place 1 tablet (0.4 mg total) under the tongue every 5 (five) minutes as needed for chest pain. 25 tablet 3   No facility-administered medications prior to visit.  Per HPI unless specifically indicated in ROS section below Review of Systems  Constitutional:  Positive for appetite change, chills and fatigue. Negative for fever.  HENT:  Positive for congestion, ear pain (right ear feels full) and sore throat.   Respiratory:  Positive for cough. Negative for shortness of breath.   Cardiovascular:  Negative for chest pain.  Gastrointestinal:  Positive for nausea. Negative for abdominal pain, diarrhea and vomiting.  Musculoskeletal:  Positive for myalgias.  Neurological:   Positive for headaches.   Objective:  Temp 99.8 F (37.7 C) Comment: per patient, this i with taking tylenol  Wt Readings from Last 3 Encounters:  08/27/21 127 lb (57.6 kg)  07/23/21 130 lb (59 kg)  07/03/21 130 lb 6 oz (59.1 kg)       Physical exam: Gen: alert, NAD, not ill appearing Pulm: speaks in complete sentences without increased work of breathing Psych: normal mood, normal thought content      Results for orders placed or performed in visit on 10/25/21  HM MAMMOGRAPHY  Result Value Ref Range   HM Mammogram 0-4 Bi-Rad 0-4 Bi-Rad, Self Reported Normal  HM DEXA SCAN  Result Value Ref Range   HM Dexa Scan Osteopenia    Assessment & Plan:   Problem List Items Addressed This Visit       Other   COVID-19 - Primary    Discussed both antivirals and later EUA only currently.  After discussion did elect to use Paxlovid.  Patient will hold her Crestor for 2 weeks.  Did discuss common side effects of medication use.  Did discuss signs and symptoms when to be seen urgent or emergently.  Also reviewed CDC guidelines/recommendations in regards to self-isolation/quarantine.      Relevant Medications   nirmatrelvir/ritonavir EUA (PAXLOVID) 20 x 150 MG & 10 x '100MG'$  TABS     Meds ordered this encounter  Medications   DISCONTD: nirmatrelvir/ritonavir EUA (PAXLOVID) 20 x 150 MG & 10 x '100MG'$  TABS    Sig: Take 3 tablets by mouth 2 (two) times daily for 5 days. (Take nirmatrelvir 150 mg two tablets twice daily for 5 days and ritonavir 100 mg one tablet twice daily for 5 days) Patient GFR is 91 HOLD CRESTOR FOR 2 WEEKS    Dispense:  30 tablet    Refill:  0    Order Specific Question:   Supervising Provider    Answer:   Loura Pardon A [[id number]]   nirmatrelvir/ritonavir EUA (PAXLOVID) 20 x 150 MG & 10 x '100MG'$  TABS    Sig: Take 3 tablets by mouth 2 (two) times daily for 5 days. (Take nirmatrelvir 150 mg two tablets twice daily for 5 days and ritonavir 100 mg one tablet twice daily for 5  days) Patient GFR is 91 HOLD CRESTOR FOR 2 WEEKS    Dispense:  30 tablet    Refill:  0   No orders of the defined types were placed in this encounter.   I discussed the assessment and treatment plan with the patient. The patient was provided an opportunity to ask questions and all were answered. The patient agreed with the plan and demonstrated an understanding of the instructions. The patient was advised to call back or seek an in-person evaluation if the symptoms worsen or if the condition fails to improve as anticipated.  Follow up plan: Return if symptoms worsen or fail to improve.  Romilda Garret, NP

## 2021-12-03 NOTE — Assessment & Plan Note (Signed)
Discussed both antivirals and later EUA only currently.  After discussion did elect to use Paxlovid.  Patient will hold her Crestor for 2 weeks.  Did discuss common side effects of medication use.  Did discuss signs and symptoms when to be seen urgent or emergently.  Also reviewed CDC guidelines/recommendations in regards to self-isolation/quarantine.

## 2022-01-17 ENCOUNTER — Other Ambulatory Visit: Payer: Self-pay | Admitting: Internal Medicine

## 2022-01-17 DIAGNOSIS — E7800 Pure hypercholesterolemia, unspecified: Secondary | ICD-10-CM

## 2022-06-16 ENCOUNTER — Telehealth: Payer: Self-pay | Admitting: *Deleted

## 2022-06-16 NOTE — Telephone Encounter (Signed)
   Pre-operative Risk Assessment    Patient Name: [name]  DOB: 1946/02/19 MRN: [id number]      Request for Surgical Clearance    Procedure:   REVISIONS OF LEFT TOTAL KNEE ARTHROPLASTY  Date of Surgery:  Clearance 07/02/22 URGENT                                Surgeon:  DR. Vonita Moss Surgeon's Group or Practice Name:  [facility] Phone number:  [phone] Fax number:  [phone]   Type of Clearance Requested:   - Medical ; NO MEDICATIONS LISTED AS NEEDING TO BE HELD   Type of Anesthesia:  Spinal (CLEARANCE FORM STATES: DOES REPEAT TTE NEED TO BE COMPLETED PRIOR TO SURGERY)    Additional requests/questions:  Please fax a copy of MOST RECENT EKG, STRESS TEST, ECHO, OV NOTES, HEART CATH) I WILL SEND A MESSAGE TO OUR HIM DEPT TO HAVE THEM FAX OVER THE REQUESTED NOTES  to the surgeon's office. DR. Vonita Moss; [facility]   Signed, Danielle Rankin   06/16/2022, 3:30 PM

## 2022-06-16 NOTE — Telephone Encounter (Signed)
Patient called stating that she has moved to [place], [place] and is no longer in the [facility] area.  Patient stated she will no longer need clearance from [facility].

## 2022-06-16 NOTE — Telephone Encounter (Signed)
    Primary Cardiologist:Paula Tenny Craw, MD  Chart reviewed as part of pre-operative protocol coverage. Because of [name]'s past medical history and time since last visit, he/she will require a follow-up Office visit in order to better assess preoperative cardiovascular risk.  Dr. Tenny Craw requested/recommended 15-month follow-up visit during last cardiology appointment.  Pre-op covering staff: - Please schedule appointment and call patient to inform them. - Please contact requesting surgeon's office via preferred method (i.e, phone, fax) to inform them of need for appointment prior to surgery.  If applicable, this message will also be routed to pharmacy pool and/or primary cardiologist for input on holding anticoagulant/antiplatelet agent as requested below so that this information is available at time of patient's appointment.   Ronney Asters, NP  06/16/2022, 3:52 PM

## 2022-06-16 NOTE — Telephone Encounter (Signed)
I called the pt to confirm that she is now being followed by [facility]. Pt was seen as new pt 05/2022. I left message for the pt that I will update the surgeon's office that they will need to get clearance from [facility].

## 2022-07-01 ENCOUNTER — Telehealth: Payer: Self-pay | Admitting: Primary Care

## 2022-07-01 NOTE — Telephone Encounter (Signed)
Contacted [name] to schedule their annual wellness visit. Patient declined to schedule AWV at this time. Moved 2 hours away.  [facility] Care Guide [facility] AWV TEAM Direct Dial: [phone]

## 2022-10-04 ENCOUNTER — Other Ambulatory Visit: Payer: Self-pay | Admitting: Internal Medicine

## 2022-10-19 ENCOUNTER — Other Ambulatory Visit: Payer: Self-pay | Admitting: Internal Medicine

## 2022-10-19 DIAGNOSIS — E7800 Pure hypercholesterolemia, unspecified: Secondary | ICD-10-CM

## 2022-11-01 ENCOUNTER — Other Ambulatory Visit: Payer: Self-pay | Admitting: Internal Medicine

## 2022-11-13 ENCOUNTER — Other Ambulatory Visit: Payer: Self-pay | Admitting: Internal Medicine

## 2022-11-13 DIAGNOSIS — E7800 Pure hypercholesterolemia, unspecified: Secondary | ICD-10-CM

## 2022-11-15 ENCOUNTER — Other Ambulatory Visit: Payer: Self-pay | Admitting: Internal Medicine

## 2022-11-15 DIAGNOSIS — E7800 Pure hypercholesterolemia, unspecified: Secondary | ICD-10-CM

## 2023-03-11 DIAGNOSIS — J029 Acute pharyngitis, unspecified: Secondary | ICD-10-CM | POA: Diagnosis not present

## 2023-03-11 DIAGNOSIS — J101 Influenza due to other identified influenza virus with other respiratory manifestations: Secondary | ICD-10-CM | POA: Diagnosis not present

## 2023-03-11 DIAGNOSIS — R051 Acute cough: Secondary | ICD-10-CM | POA: Diagnosis not present

## 2023-03-11 DIAGNOSIS — M791 Myalgia, unspecified site: Secondary | ICD-10-CM | POA: Diagnosis not present

## 2023-04-14 DIAGNOSIS — K2210 Ulcer of esophagus without bleeding: Secondary | ICD-10-CM | POA: Diagnosis not present

## 2023-04-14 DIAGNOSIS — R600 Localized edema: Secondary | ICD-10-CM | POA: Diagnosis not present

## 2023-04-14 DIAGNOSIS — R5383 Other fatigue: Secondary | ICD-10-CM | POA: Diagnosis not present

## 2023-04-23 DIAGNOSIS — R5383 Other fatigue: Secondary | ICD-10-CM | POA: Diagnosis not present

## 2023-04-23 DIAGNOSIS — I447 Left bundle-branch block, unspecified: Secondary | ICD-10-CM | POA: Diagnosis not present

## 2023-04-23 DIAGNOSIS — E7800 Pure hypercholesterolemia, unspecified: Secondary | ICD-10-CM | POA: Diagnosis not present

## 2023-04-23 DIAGNOSIS — I10 Essential (primary) hypertension: Secondary | ICD-10-CM | POA: Diagnosis not present

## 2023-04-23 DIAGNOSIS — I428 Other cardiomyopathies: Secondary | ICD-10-CM | POA: Diagnosis not present

## 2023-04-28 DIAGNOSIS — C44729 Squamous cell carcinoma of skin of left lower limb, including hip: Secondary | ICD-10-CM | POA: Diagnosis not present

## 2023-04-28 DIAGNOSIS — C44629 Squamous cell carcinoma of skin of left upper limb, including shoulder: Secondary | ICD-10-CM | POA: Diagnosis not present

## 2023-04-28 DIAGNOSIS — D485 Neoplasm of uncertain behavior of skin: Secondary | ICD-10-CM | POA: Diagnosis not present

## 2023-05-01 DIAGNOSIS — R5383 Other fatigue: Secondary | ICD-10-CM | POA: Diagnosis not present

## 2023-05-06 DIAGNOSIS — I447 Left bundle-branch block, unspecified: Secondary | ICD-10-CM | POA: Diagnosis not present

## 2023-05-06 DIAGNOSIS — R5383 Other fatigue: Secondary | ICD-10-CM | POA: Diagnosis not present

## 2023-05-06 DIAGNOSIS — I428 Other cardiomyopathies: Secondary | ICD-10-CM | POA: Diagnosis not present

## 2023-07-31 IMAGING — DX DG KNEE COMPLETE 4+V*L*
4 series · 4 of 4 positions shown · non-contrast
Comparison: None.

CLINICAL DATA: Left knee pain status post knee replacement
03/12/2021.

EXAM:
LEFT KNEE - COMPLETE 4+ VIEW

[knee ap]
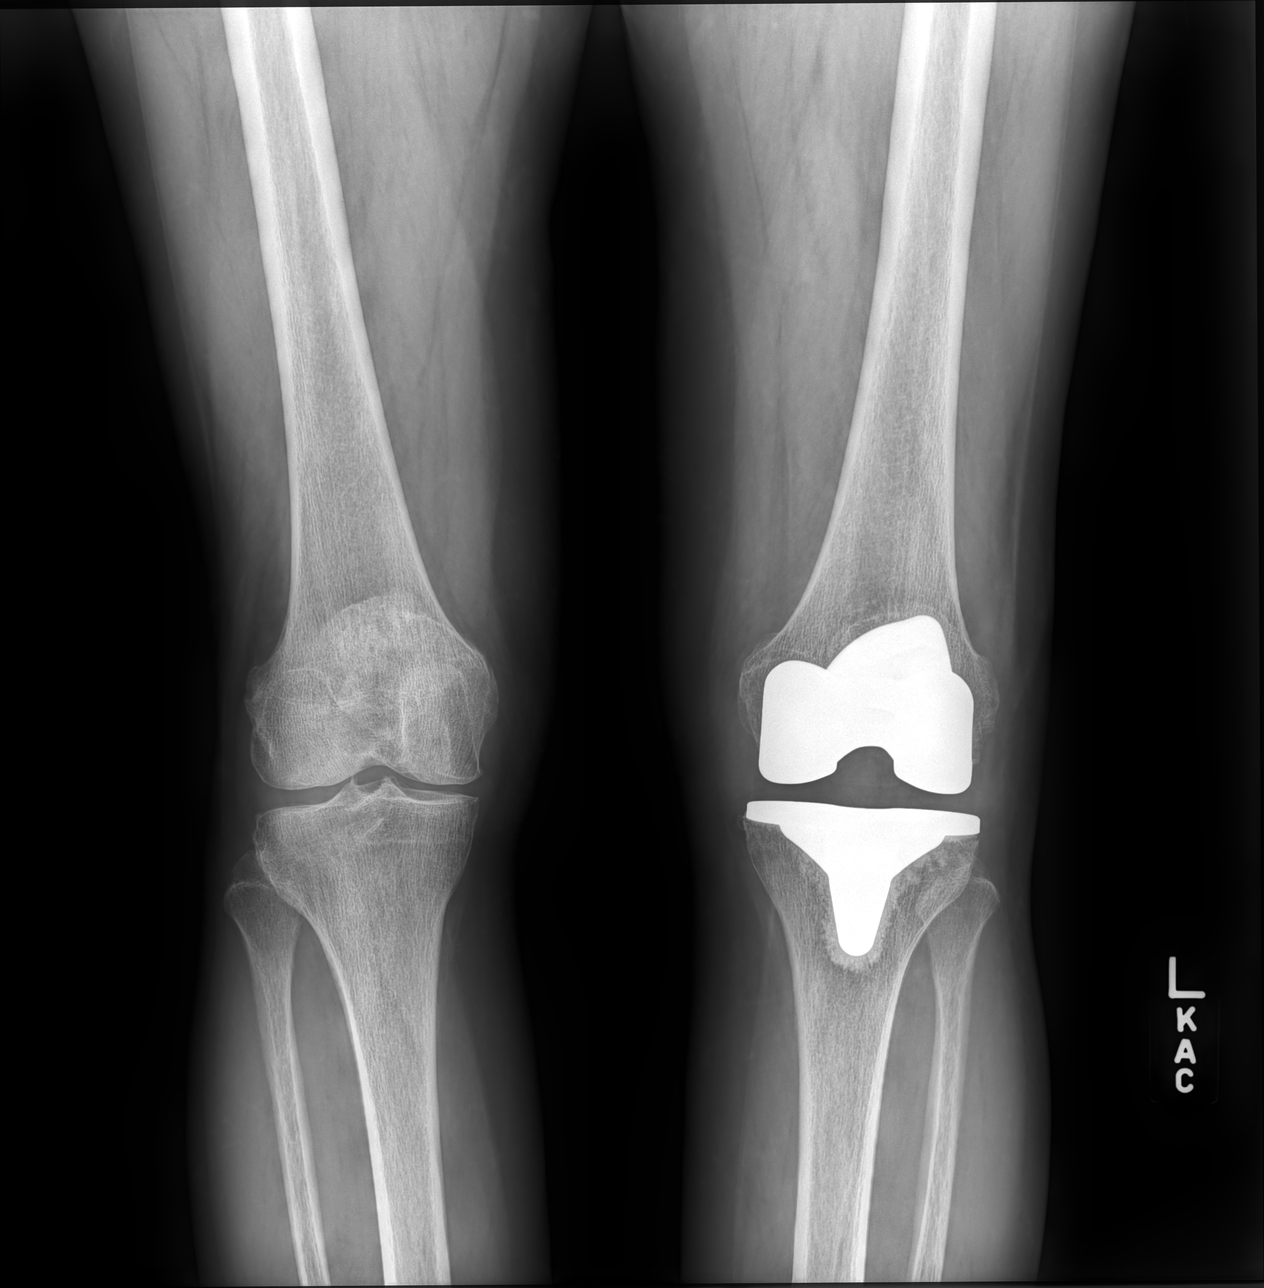

[knee lat]
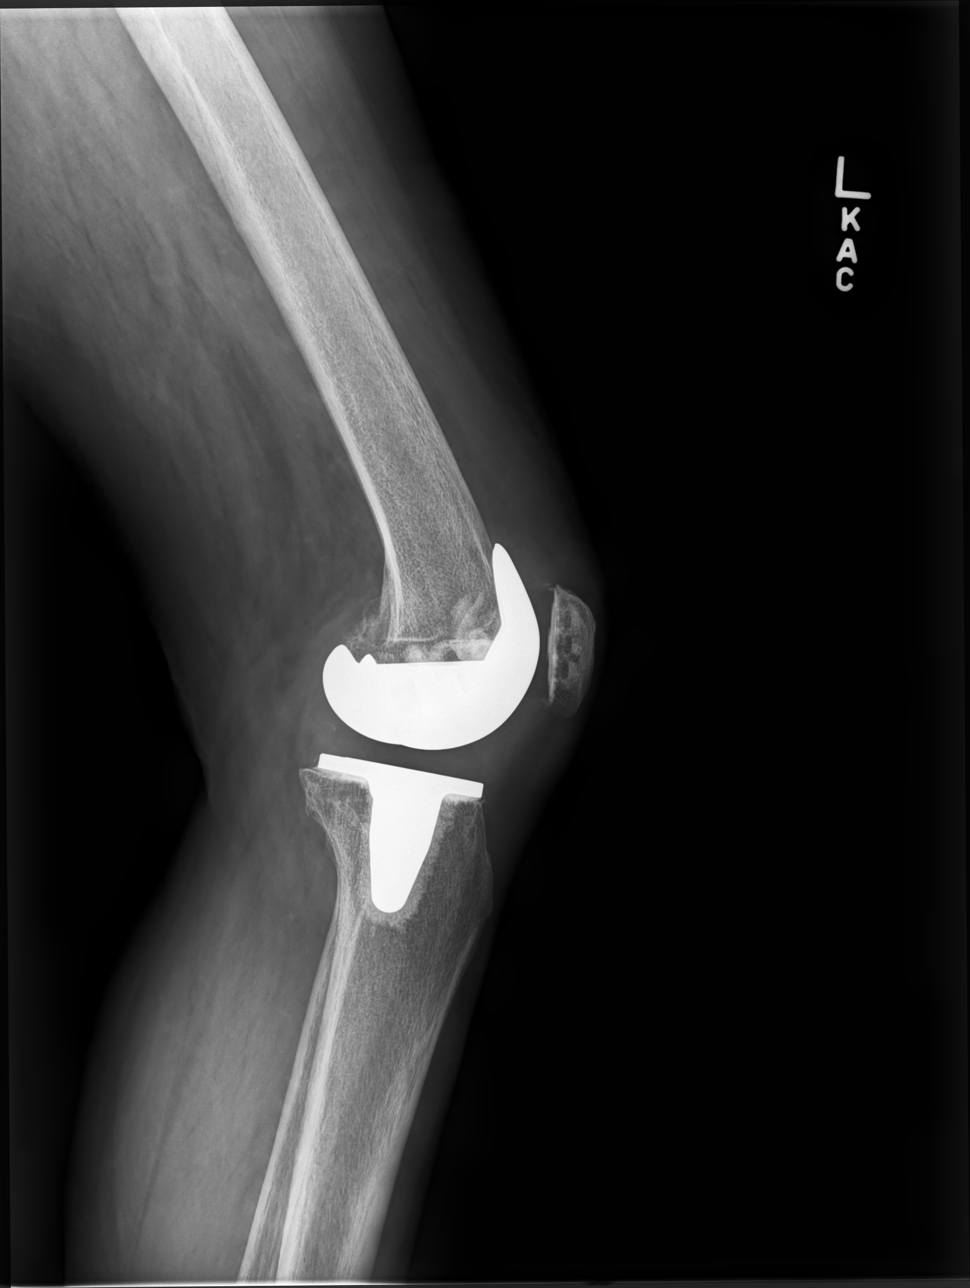

[knee [person_name] view pa]
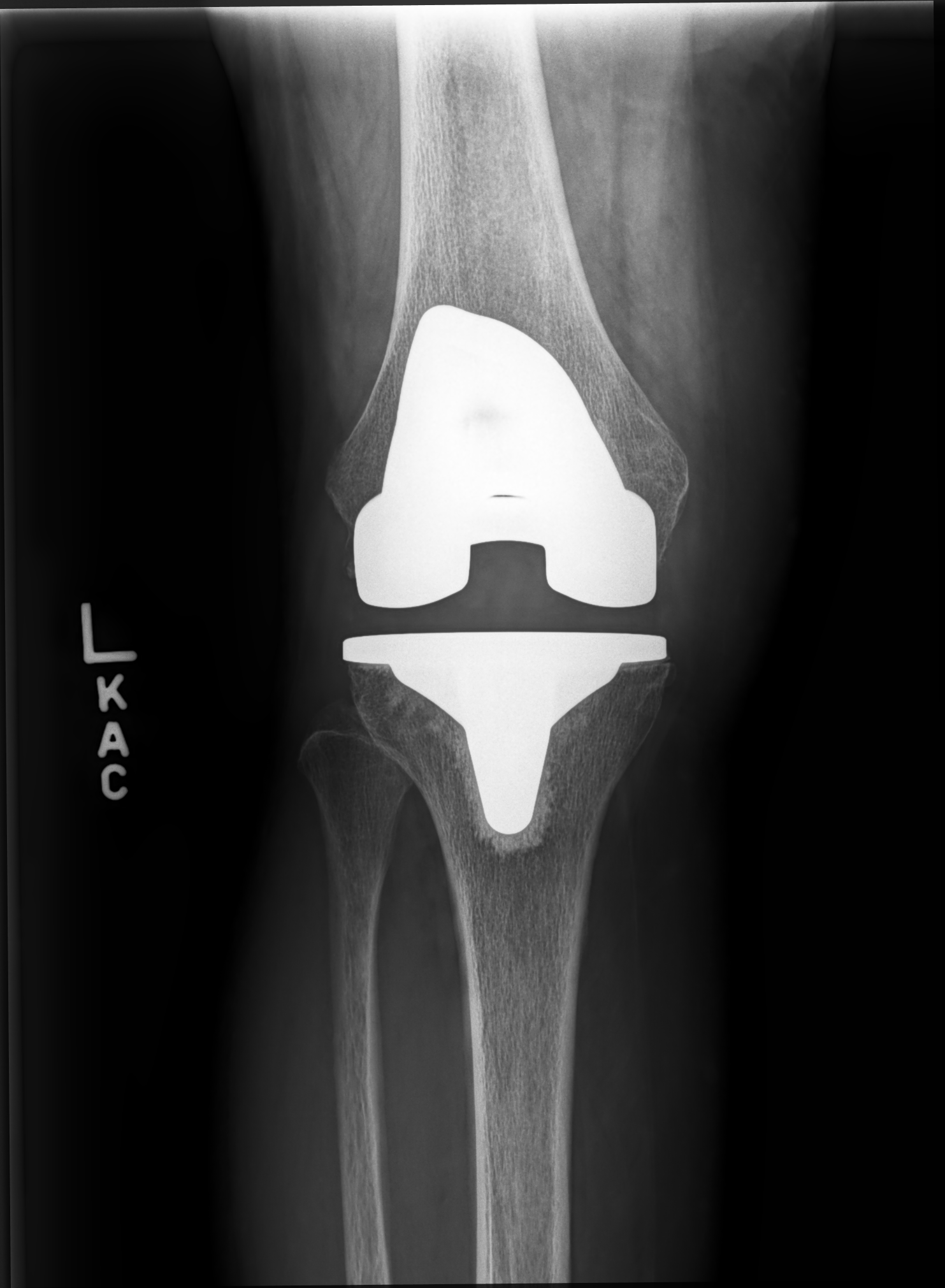

[patella (sunrise) tan]
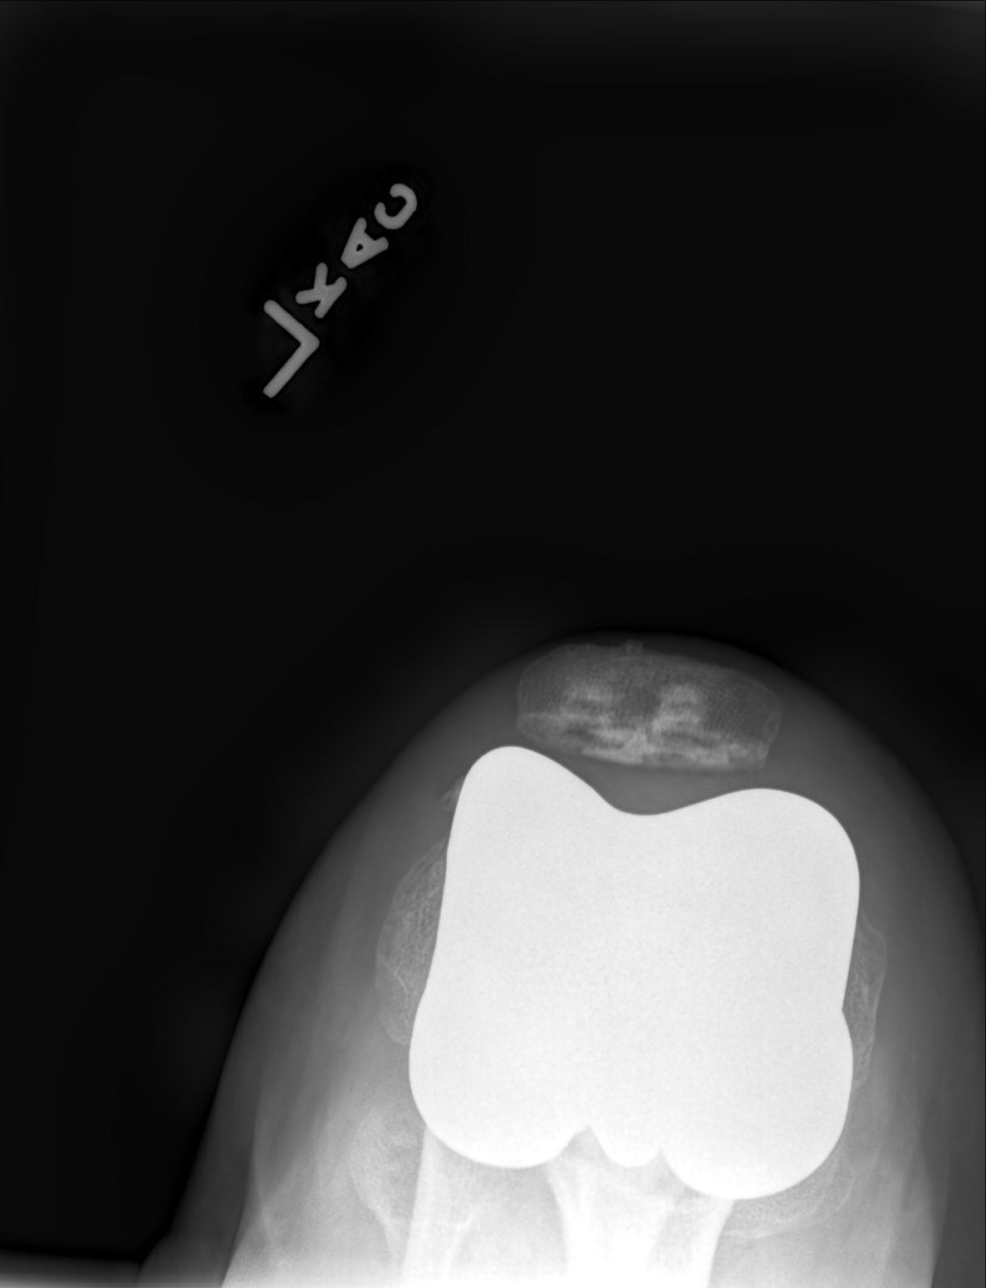

[4 of 4 positions shown; findings below may reference images not displayed]

FINDINGS: Status post total left knee arthroplasty. Mildly decreased bone
mineralization. No perihardware lucency is seen to indicate hardware
failure or loosening. Tiny joint effusion. No acute fracture or
dislocation.

On single frontal view of the right knee there is minimal peripheral
medial compartment degenerative spurring.
IMPRESSION: Status post total left knee arthroplasty without evidence of
hardware failure.
